# Patient Record
Sex: Male | Born: 1940 | Race: White | Hispanic: No | Marital: Married | Smoking: Former smoker
Health system: Southern US, Community
[De-identification: ages and names within clinical notes are randomized; demographics above are authoritative.]

## PROBLEM LIST (undated history)

## (undated) DIAGNOSIS — I639 Cerebral infarction, unspecified: Secondary | ICD-10-CM

## (undated) DIAGNOSIS — N182 Chronic kidney disease, stage 2 (mild): Secondary | ICD-10-CM

## (undated) DIAGNOSIS — I10 Essential (primary) hypertension: Secondary | ICD-10-CM

## (undated) DIAGNOSIS — I509 Heart failure, unspecified: Secondary | ICD-10-CM

## (undated) DIAGNOSIS — Z860100 Personal history of colon polyps, unspecified: Secondary | ICD-10-CM

## (undated) DIAGNOSIS — I2510 Atherosclerotic heart disease of native coronary artery without angina pectoris: Secondary | ICD-10-CM

## (undated) DIAGNOSIS — G459 Transient cerebral ischemic attack, unspecified: Secondary | ICD-10-CM

## (undated) DIAGNOSIS — E785 Hyperlipidemia, unspecified: Secondary | ICD-10-CM

## (undated) DIAGNOSIS — N400 Enlarged prostate without lower urinary tract symptoms: Secondary | ICD-10-CM

## (undated) DIAGNOSIS — K635 Polyp of colon: Secondary | ICD-10-CM

## (undated) HISTORY — DX: Hyperlipidemia, unspecified: E78.5

## (undated) HISTORY — DX: Atherosclerotic heart disease of native coronary artery without angina pectoris: I25.10

## (undated) HISTORY — DX: Polyp of colon: K63.5

## (undated) HISTORY — DX: Essential (primary) hypertension: I10

## (undated) HISTORY — DX: Cerebral infarction, unspecified: I63.9

## (undated) HISTORY — PX: CARDIAC CATHETERIZATION: SHX172

## (undated) HISTORY — DX: Benign prostatic hyperplasia without lower urinary tract symptoms: N40.0

## (undated) HISTORY — DX: Personal history of colonic polyps: Z86.010

## (undated) HISTORY — DX: Personal history of colon polyps, unspecified: Z86.0100

---

## 1996-08-13 HISTORY — PX: CATARACT EXTRACTION W/ INTRAOCULAR LENS  IMPLANT, BILATERAL: SHX1307

## 1997-08-13 DIAGNOSIS — I509 Heart failure, unspecified: Secondary | ICD-10-CM

## 1997-08-13 HISTORY — DX: Heart failure, unspecified: I50.9

## 1997-08-13 HISTORY — PX: CORONARY ARTERY BYPASS GRAFT: SHX141

## 1997-11-11 ENCOUNTER — Encounter (HOSPITAL_COMMUNITY): Admission: RE | Admit: 1997-11-11 | Discharge: 1998-02-09 | Payer: Self-pay | Admitting: Cardiovascular Disease

## 2001-05-30 ENCOUNTER — Emergency Department (HOSPITAL_COMMUNITY): Admission: EM | Admit: 2001-05-30 | Discharge: 2001-05-31 | Payer: Self-pay

## 2001-08-13 DIAGNOSIS — G459 Transient cerebral ischemic attack, unspecified: Secondary | ICD-10-CM

## 2001-08-13 HISTORY — DX: Transient cerebral ischemic attack, unspecified: G45.9

## 2004-08-16 ENCOUNTER — Ambulatory Visit: Payer: Self-pay | Admitting: Cardiovascular Disease

## 2005-03-19 ENCOUNTER — Ambulatory Visit: Payer: Self-pay | Admitting: Cardiovascular Disease

## 2005-03-29 ENCOUNTER — Ambulatory Visit (HOSPITAL_COMMUNITY): Admission: RE | Admit: 2005-03-29 | Discharge: 2005-03-29 | Payer: Self-pay | Admitting: Cardiovascular Disease

## 2005-06-19 ENCOUNTER — Ambulatory Visit: Payer: Self-pay | Admitting: Cardiovascular Disease

## 2005-10-01 ENCOUNTER — Ambulatory Visit: Payer: Self-pay | Admitting: Cardiovascular Disease

## 2006-05-23 ENCOUNTER — Ambulatory Visit: Payer: Self-pay | Admitting: Cardiovascular Disease

## 2006-08-13 HISTORY — PX: COLONOSCOPY: SHX174

## 2006-10-01 ENCOUNTER — Ambulatory Visit: Payer: Self-pay | Admitting: Internal Medicine

## 2007-01-21 ENCOUNTER — Ambulatory Visit: Payer: Self-pay | Admitting: Internal Medicine

## 2007-01-21 LAB — CONVERTED CEMR LAB
ALT: 12 units/L (ref 0–40)
AST: 20 units/L (ref 0–37)
Albumin: 3.8 g/dL (ref 3.5–5.2)
Alkaline Phosphatase: 63 units/L (ref 39–117)
BUN: 12 mg/dL (ref 6–23)
Basophils Absolute: 0 10*3/uL (ref 0.0–0.1)
Basophils Relative: 0.3 % (ref 0.0–1.0)
Bilirubin, Direct: 0.1 mg/dL (ref 0.0–0.3)
CO2: 31 meq/L (ref 19–32)
Calcium: 9.6 mg/dL (ref 8.4–10.5)
Chloride: 98 meq/L (ref 96–112)
Cholesterol: 130 mg/dL (ref 0–200)
Creatinine, Ser: 1.1 mg/dL (ref 0.4–1.5)
Eosinophils Absolute: 0.3 10*3/uL (ref 0.0–0.6)
Eosinophils Relative: 3.9 % (ref 0.0–5.0)
GFR calc Af Amer: 86 mL/min
GFR calc non Af Amer: 71 mL/min
Glucose, Bld: 120 mg/dL — ABNORMAL HIGH (ref 70–99)
HCT: 41.9 % (ref 39.0–52.0)
HDL: 40.8 mg/dL (ref >39.0)
Hemoglobin: 14.5 g/dL (ref 13.0–17.0)
Hgb A1c MFr Bld: 6.3 % — ABNORMAL HIGH (ref 4.6–6.0)
LDL Cholesterol: 72 mg/dL (ref 0–99)
Lymphocytes Relative: 22.2 % (ref 12.0–46.0)
MCHC: 34.6 g/dL (ref 30.0–36.0)
MCV: 94.6 fL (ref 78.0–100.0)
Monocytes Absolute: 0.6 10*3/uL (ref 0.2–0.7)
Monocytes Relative: 6.8 % (ref 3.0–11.0)
Neutro Abs: 5.9 10*3/uL (ref 1.4–7.7)
Neutrophils Relative %: 66.8 % (ref 43.0–77.0)
PSA: 1.92 ng/mL (ref 0.10–4.00)
Platelets: 327 10*3/uL (ref 150–400)
Potassium: 4.1 meq/L (ref 3.5–5.1)
RBC: 4.43 M/uL (ref 4.22–5.81)
RDW: 12.5 % (ref 11.5–14.6)
Sodium: 136 meq/L (ref 135–145)
TSH: 0.98 microintl units/mL (ref 0.35–5.50)
Total Bilirubin: 0.8 mg/dL (ref 0.3–1.2)
Total CHOL/HDL Ratio: 3.2
Total Protein: 6.9 g/dL (ref 6.0–8.3)
Triglycerides: 85 mg/dL (ref 0–149)
VLDL: 17 mg/dL (ref 0–40)
WBC: 8.8 10*3/uL (ref 4.5–10.5)

## 2007-01-27 ENCOUNTER — Ambulatory Visit: Payer: Self-pay | Admitting: Internal Medicine

## 2007-02-25 ENCOUNTER — Ambulatory Visit: Payer: Self-pay | Admitting: Internal Medicine

## 2007-04-15 ENCOUNTER — Encounter: Payer: Self-pay | Admitting: Internal Medicine

## 2007-04-15 ENCOUNTER — Ambulatory Visit: Payer: Self-pay | Admitting: Internal Medicine

## 2007-04-15 DIAGNOSIS — K635 Polyp of colon: Secondary | ICD-10-CM

## 2007-04-15 HISTORY — DX: Polyp of colon: K63.5

## 2007-06-26 ENCOUNTER — Ambulatory Visit: Payer: Self-pay | Admitting: Internal Medicine

## 2007-07-28 ENCOUNTER — Ambulatory Visit: Payer: Self-pay | Admitting: Internal Medicine

## 2007-07-28 DIAGNOSIS — I10 Essential (primary) hypertension: Secondary | ICD-10-CM | POA: Insufficient documentation

## 2007-07-28 DIAGNOSIS — N400 Enlarged prostate without lower urinary tract symptoms: Secondary | ICD-10-CM

## 2007-07-28 DIAGNOSIS — Z860100 Personal history of colon polyps, unspecified: Secondary | ICD-10-CM | POA: Insufficient documentation

## 2007-07-28 DIAGNOSIS — E785 Hyperlipidemia, unspecified: Secondary | ICD-10-CM | POA: Insufficient documentation

## 2007-07-28 DIAGNOSIS — I2510 Atherosclerotic heart disease of native coronary artery without angina pectoris: Secondary | ICD-10-CM

## 2007-07-28 DIAGNOSIS — I252 Old myocardial infarction: Secondary | ICD-10-CM

## 2007-08-22 ENCOUNTER — Encounter: Payer: Self-pay | Admitting: Internal Medicine

## 2007-08-29 ENCOUNTER — Ambulatory Visit: Payer: Self-pay | Admitting: Cardiovascular Disease

## 2007-11-19 ENCOUNTER — Ambulatory Visit: Payer: Self-pay | Admitting: Internal Medicine

## 2007-11-19 LAB — CONVERTED CEMR LAB
ALT: 12 units/L (ref 0–53)
AST: 22 units/L (ref 0–37)
Albumin: 3.8 g/dL (ref 3.5–5.2)
Alkaline Phosphatase: 63 units/L (ref 39–117)
BUN: 24 mg/dL — ABNORMAL HIGH (ref 6–23)
Bilirubin, Direct: 0.2 mg/dL (ref 0.0–0.3)
CO2: 31 meq/L (ref 19–32)
Calcium: 9.7 mg/dL (ref 8.4–10.5)
Chloride: 105 meq/L (ref 96–112)
Cholesterol: 152 mg/dL (ref 0–200)
Creatinine, Ser: 1.2 mg/dL (ref 0.4–1.5)
GFR calc Af Amer: 78 mL/min
GFR calc non Af Amer: 64 mL/min
Glucose, Bld: 107 mg/dL — ABNORMAL HIGH (ref 70–99)
HDL: 34.1 mg/dL — ABNORMAL LOW (ref >39.0)
LDL Cholesterol: 100 mg/dL — ABNORMAL HIGH (ref 0–99)
Potassium: 5.2 meq/L — ABNORMAL HIGH (ref 3.5–5.1)
Sodium: 140 meq/L (ref 135–145)
Total Bilirubin: 0.8 mg/dL (ref 0.3–1.2)
Total CHOL/HDL Ratio: 4.5
Total Protein: 6.8 g/dL (ref 6.0–8.3)
Triglycerides: 89 mg/dL (ref 0–149)
VLDL: 18 mg/dL (ref 0–40)

## 2007-11-26 ENCOUNTER — Ambulatory Visit: Payer: Self-pay | Admitting: Internal Medicine

## 2007-11-26 LAB — CONVERTED CEMR LAB
Cholesterol, target level: 200 mg/dL
HDL goal, serum: 40 mg/dL
LDL Goal: 100 mg/dL

## 2007-12-03 DIAGNOSIS — I6350 Cerebral infarction due to unspecified occlusion or stenosis of unspecified cerebral artery: Secondary | ICD-10-CM | POA: Insufficient documentation

## 2007-12-03 DIAGNOSIS — R739 Hyperglycemia, unspecified: Secondary | ICD-10-CM | POA: Insufficient documentation

## 2008-03-25 ENCOUNTER — Ambulatory Visit: Payer: Self-pay | Admitting: Cardiovascular Disease

## 2008-03-25 ENCOUNTER — Encounter: Payer: Self-pay | Admitting: Internal Medicine

## 2008-03-25 ENCOUNTER — Ambulatory Visit: Payer: Self-pay

## 2008-05-21 ENCOUNTER — Ambulatory Visit: Payer: Self-pay | Admitting: Internal Medicine

## 2008-05-21 LAB — CONVERTED CEMR LAB
ALT: 16 units/L (ref 0–53)
AST: 26 units/L (ref 0–37)
Albumin: 3.9 g/dL (ref 3.5–5.2)
Alkaline Phosphatase: 74 units/L (ref 39–117)
BUN: 16 mg/dL (ref 6–23)
Bilirubin, Direct: 0.1 mg/dL (ref 0.0–0.3)
CO2: 33 meq/L — ABNORMAL HIGH (ref 19–32)
Calcium: 10 mg/dL (ref 8.4–10.5)
Chloride: 99 meq/L (ref 96–112)
Cholesterol: 137 mg/dL (ref 0–200)
Creatinine, Ser: 1.1 mg/dL (ref 0.4–1.5)
GFR calc Af Amer: 86 mL/min
GFR calc non Af Amer: 71 mL/min
Glucose, Bld: 109 mg/dL — ABNORMAL HIGH (ref 70–99)
HDL: 38.4 mg/dL — ABNORMAL LOW (ref >39.0)
LDL Cholesterol: 81 mg/dL (ref 0–99)
Potassium: 4.3 meq/L (ref 3.5–5.1)
Sodium: 141 meq/L (ref 135–145)
Total Bilirubin: 0.8 mg/dL (ref 0.3–1.2)
Total CHOL/HDL Ratio: 3.6
Total Protein: 6.9 g/dL (ref 6.0–8.3)
Triglycerides: 86 mg/dL (ref 0–149)
VLDL: 17 mg/dL (ref 0–40)

## 2008-06-07 ENCOUNTER — Ambulatory Visit: Payer: Self-pay | Admitting: Internal Medicine

## 2008-06-07 LAB — CONVERTED CEMR LAB: PSA: 1.76 ng/mL (ref 0.10–4.00)

## 2008-09-30 ENCOUNTER — Ambulatory Visit: Payer: Self-pay | Admitting: Cardiovascular Disease

## 2008-09-30 ENCOUNTER — Encounter: Payer: Self-pay | Admitting: Cardiovascular Disease

## 2008-09-30 DIAGNOSIS — I25810 Atherosclerosis of coronary artery bypass graft(s) without angina pectoris: Secondary | ICD-10-CM | POA: Insufficient documentation

## 2009-05-06 ENCOUNTER — Encounter: Payer: Self-pay | Admitting: Cardiovascular Disease

## 2009-09-13 ENCOUNTER — Ambulatory Visit: Payer: Self-pay | Admitting: Cardiovascular Disease

## 2010-09-12 NOTE — Letter (Signed)
Summary: Influenza Immunization Record  Influenza Immunization Record   Imported By: Marylou Mccoy 10/03/2009 13:02:28  _____________________________________________________________________  External Attachment:    Type:   Image     Comment:   External Document

## 2010-09-12 NOTE — Assessment & Plan Note (Signed)
Summary: yearly f/u cy      Allergies Added:   History of Present Illness: Thomas [name] today for followup.he has a distant history of coronary bypass surgery his last Myoview in August of 09 was nonischemic with an EF of 58%.  He is active and not having any significant chest pain.  The the patient's PND or orthopnea no lower extremity edema or exertional dyspnea.  He's been compliant with his medications.  He is Dr. Janan [name].  His most recent lipid profile is within target.  Is not having any side effects from the statin drugs.  He does tend to bruise easily on aspirin. He needs a refill on his meds through [facility] for 90 days.  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 One By Mouth Daily 2)  Folic Acid 1 Mg  Tabs (Folic Acid) .... Take 1 By Mouth Qd 3)  Metoprolol Succinate 50 Mg  Tb24 (Metoprolol Succinate) .... Take 1 By Mouth Two Times A Day Qd 4)  Lipitor 40 Mg  Tabs (Atorvastatin Calcium) .... Take 1 By Mouth Qd 5)  Aspirin Ec 325 Mg  Tbec (Aspirin) .... Take 1 By Mouth Qd 6)  Fish Oil 1000 Mg  Caps (Omega-3 Fatty Acids) .... Take 1 By Mouth Qd 7)  Flax   Oil (Flaxseed (Linseed)) .... Take 1 By Mouth Qd 8)  Co Q-10 50 Mg  Caps (Coenzyme Q10) .... Take 1 By Mouth Qd  Allergies (verified): 1)  ! Sulfa  Past History:  Past Medical History: Last updated: 09/30/2008 Coronary artery  disease Hyperlipidemia Hypertension Benign prostatic hypertrophy with elevated PSA   Dr. 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   Vital Signs:  Patient profile:   70 year old male Height:      72 inches Weight:      161 pounds BMI:     21.91 Pulse rate:   65 / minute Resp:     12 per minute BP sitting:   142 / 85  (left arm)  Vitals Entered By: Thomas [name] (September 13, 2009 10:32 AM)  Physical Exam  General:  Affect appropriate Healthy:  appears stated age HEENT: normal Neck supple with no adenopathy JVP normal no bruits no thyromegaly Lungs clear with no wheezing and good diaphragmatic motion Heart:  S1/S2 no murmur,rub, gallop or click PMI normal Abdomen: benighn, BS positve, no tenderness, no AAA no bruit.  No HSM or  HJR Distal pulses intact with no bruits No edema Neuro non-focal Skin warm and dry    Impression & Recommendations:  Problem # 1:  CAD, ARTERY BYPASS GRAFT (ICD-414.04) Stable no angina.   His updated medication list for this problem includes:    Moexipril-hydrochlorothiazide 15-12.5 Mg Tabs (Moexipril-hydrochlorothiazide) ..... One by mouth daily    Metoprolol Succinate 50 Mg Tb24 (Metoprolol succinate) .Marland Kitchen... Take 1 by mouth two times a day qd    Aspirin Ec 325 Mg Tbec (Aspirin) .Marland Kitchen... Take 1 by mouth qd  Problem # 2:   HYPERLIPIDEMIA-MIXED (ICD-272.4) At target with no side effects His updated medication list for this problem includes:    Lipitor 40 Mg Tabs (Atorvastatin calcium) .Marland Kitchen... Take 1 by mouth qd  CHOL: 137 (05/21/2008)   LDL: 81 (05/21/2008)   HDL: 38.4 (05/21/2008)   TG: 86 (05/21/2008) CHOL (goal): 200 (11/26/2007)   LDL (goal): 100 (11/26/2007)   HDL (goal): 40 (11/26/2007)   TG (goal): 150 (11/26/2007)  Problem # 3:  HYPERTENSION (ICD-401.9) Well controlled continue low soidum diet His updated medication list for this problem includes:    Moexipril-hydrochlorothiazide 15-12.5 Mg Tabs (Moexipril-hydrochlorothiazide) ..... One by mouth daily    Metoprolol Succinate 50 Mg Tb24 (Metoprolol succinate) .Marland Kitchen... Take 1 by mouth two times a day qd    Aspirin Ec 325 Mg Tbec (Aspirin) .Marland Kitchen... Take 1 by mouth qd  Patient Instructions: 1)  Your physician recommends that you schedule a follow-up appointment in: ONE YEAR Prescriptions: LIPITOR 40 MG  TABS (ATORVASTATIN CALCIUM) take 1 by mouth qd  #90 x 4   Entered by:   Deliah Goody, RN   Authorized by:   Colon Branch, MD, [facility]   Signed by:   Deliah Goody, RN on 09/13/2009   Method used:   Electronically to        [facility]* (mail-order)             ,          Ph: [phone]       Fax: [phone]   RxID:   [id number] METOPROLOL SUCCINATE 50 MG  TB24 (METOPROLOL SUCCINATE) take 1 by mouth two times a day qd  #90 x 4   Entered by:   Deliah Goody, RN   Authorized by:   Colon Branch, MD, [facility]   Signed by:   Deliah Goody, RN on 09/13/2009   Method used:   Electronically to        [facility]* (mail-order)             ,          Ph: [phone]       Fax: [phone]   RxID:   [id number] FOLIC ACID 1 MG  TABS (FOLIC ACID) take 1 by mouth qd  #90 x 4   Entered by:   Deliah Goody, RN   Authorized by:   Colon Branch, MD, [facility]   Signed by:   Deliah Goody, RN on 09/13/2009   Method used:   Electronically to          [facility]* (mail-order)             ,          Ph: [phone]       Fax: [phone]   RxID:   [id number] MOEXIPRIL-HYDROCHLOROTHIAZIDE 15-12.5 MG TABS (MOEXIPRIL-HYDROCHLOROTHIAZIDE) one by mouth daily  #90 x 4   Entered by:   Deliah Goody, RN  Authorized by:   Colon Branch, MD, [facility]   Signed by:   Deliah Goody, RN on 09/13/2009   Method used:   Electronically to        [facility] [employer]* (mail-order)             ,          Ph: [phone]       Fax: [phone]   RxID:   [id number]    EKG Report  Procedure date:  09/13/2009  Findings:      NSR 62 Nonspecific ST/T wave changes Abnormal ECG

## 2010-09-14 ENCOUNTER — Encounter: Payer: Self-pay | Admitting: Cardiovascular Disease

## 2010-09-14 ENCOUNTER — Ambulatory Visit (INDEPENDENT_AMBULATORY_CARE_PROVIDER_SITE_OTHER): Payer: Medicare Other | Admitting: Cardiovascular Disease

## 2010-09-14 ENCOUNTER — Ambulatory Visit: Admit: 2010-09-14 | Payer: Self-pay | Admitting: Cardiovascular Disease

## 2010-09-14 DIAGNOSIS — E785 Hyperlipidemia, unspecified: Secondary | ICD-10-CM

## 2010-09-14 DIAGNOSIS — I2510 Atherosclerotic heart disease of native coronary artery without angina pectoris: Secondary | ICD-10-CM

## 2010-09-20 NOTE — Assessment & Plan Note (Signed)
Summary: F1Y/DM RS PER BUMPLIST/PT MJ   Vital Signs:  Patient profile:   70 year old male Height:      72 inches Weight:      158 pounds BMI:     21.51 Pulse rate:   68 / minute Resp:     12 per minute BP sitting:   130 / 82  (left arm)  Vitals Entered By: Kem Parkinson (September 14, 2010 11:08 AM)  History of Present Illness: Thomas [name] returns today for followup.he has a distant history of coronary bypass surgery his last Myoview in August of 09 was nonischemic with an EF of 58%.  He is active and not having any significant chest pain.  The the patient's PND or orthopnea no lower extremity edema or exertional dyspnea.  He's been compliant with his medications.  He  is overdue to see Dr Lovell Sheehan   His most recent lipid profile is within target.  Is not having any side effects from the statin drugs.  He does tend to bruise easily on aspirin. He needs a refill on his meds through [facility] for 90 days.  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 One By Mouth Daily 2)  Folic Acid 1 Mg  Tabs (Folic Acid) .... Take 1 By Mouth Qd 3)  Metoprolol Succinate 50 Mg  Tb24 (Metoprolol Succinate) .... Take 1 By Mouth Two Times A Day Qd 4)  Lipitor 40 Mg  Tabs (Atorvastatin Calcium) .... Take 1 By Mouth Qd 5)  Aspirin Ec 325 Mg  Tbec (Aspirin) .... Take 1 By Mouth Qd 6)  Fish Oil 1000 Mg  Caps (Omega-3 Fatty Acids) .... Take 1 By  Mouth Qd 7)  Flax   Oil (Flaxseed (Linseed)) .... Take 1 By Mouth Qd 8)  Co Q-10 50 Mg  Caps (Coenzyme Q10) .... Take 1 By Mouth Qd  Allergies (verified): 1)  ! Sulfa  Past History:  Past Medical History: Last updated: 09/30/2008 Coronary artery disease Hyperlipidemia Hypertension Benign prostatic hypertrophy with elevated PSA   Dr. Logan Bores Myocardial infarction, hx of 2000    Colonic polyps, hx of HYPERPLASTIC COLON POLYPS-04/15/2007  Past Surgical History: Last updated: 09/30/2008 Coronary artery bypass graft  x 4 1999 Dr gerhardt colonoscopy 2008  Family History: Last updated: 07/28/2007 father had a CVA mother had CAD  Social History: Last updated: 07/28/2007 Married Former Smoker Alcohol use-no Drug use-no Regular exercise-yes  Review of Systems       Denies fever, malais, weight loss, blurry vision, decreased visual acuity, cough, sputum, SOB, hemoptysis, pleuritic pain, palpitaitons, heartburn, abdominal pain, melena, lower extremity edema, claudication, or rash.   Physical Exam  General:  Affect appropriate Healthy:  appears stated age HEENT: normal Neck supple with no adenopathy JVP normal no bruits no thyromegaly Lungs clear with no wheezing and good diaphragmatic motion Heart:  S1/S2 no murmur,rub, gallop or click PMI normal Abdomen: benighn, BS positve, no tenderness, no AAA no bruit.  No HSM or HJR  Distal pulses intact with no bruits No edema Neuro non-focal Skin warm and dry    Impression & Recommendations:  Problem # 1:  CAD, ARTERY BYPASS GRAFT (ICD-414.04) Stable no angina His updated medication list for this problem includes:    Moexipril-hydrochlorothiazide 15-12.5 Mg Tabs (Moexipril-hydrochlorothiazide) ..... One by mouth daily    Metoprolol Succinate 50 Mg Tb24 (Metoprolol succinate) .Marland Kitchen... Take 1 by mouth two times a day qd    Aspirin Ec 325 Mg Tbec (Aspirin) .Marland Kitchen... Take 1 by mouth qd  Problem # 2:  HYPERLIPIDEMIA-MIXED  (ICD-272.4) At target with no side effects  F/U labs with Lovell Sheehan His updated medication list for this problem includes:    Lipitor 40 Mg Tabs (Atorvastatin calcium) .Marland Kitchen... Take 1 by mouth qd  CHOL: 137 (05/21/2008)   LDL: 81 (05/21/2008)   HDL: 38.4 (05/21/2008)   TG: 86 (05/21/2008) CHOL (goal): 200 (11/26/2007)   LDL (goal): 100 (11/26/2007)   HDL (goal): 40 (11/26/2007)   TG (goal): 150 (11/26/2007)  Problem # 3:  HYPERTENSION (ICD-401.9) Well controlled His updated medication list for this problem includes:    Moexipril-hydrochlorothiazide 15-12.5 Mg Tabs (Moexipril-hydrochlorothiazide) ..... One by mouth daily    Metoprolol Succinate 50 Mg Tb24 (Metoprolol succinate) .Marland Kitchen... Take 1 by mouth two times a day qd    Aspirin Ec 325 Mg Tbec (Aspirin) .Marland Kitchen... Take 1 by mouth qd  Patient Instructions: 1)  Your physician wants you to follow-up in:6 MONTHS   You will receive a reminder letter in the mail two months in advance. If you don't receive a letter, please call our office to schedule the follow-up appointment. Prescriptions: FOLIC ACID 1 MG  TABS (FOLIC ACID) take 1 by mouth qd  #90 x 4   Entered by:   Deliah Goody, RN   Authorized by:   Colon Branch, MD, [facility]   Signed by:   Colon Branch, MD, [facility] on 09/14/2010   Method used:   Electronically to        [facility] [address] [phone]* (retail)       [address]       [place], [place]  [address]       Ph: [phone] or [phone]       Fax: [phone]   RxID:   [id number] MOEXIPRIL-HYDROCHLOROTHIAZIDE 15-12.5 MG TABS (MOEXIPRIL-HYDROCHLOROTHIAZIDE) one by mouth daily  #90 x 4   Entered by:   Deliah Goody, RN   Authorized by:   Colon Branch, MD, [facility]   Signed by:   Colon Branch, MD, [facility] on 09/14/2010   Method used:   [facility]        [facility] [address] [phone]* (retail)       [address]       [place], [place]  [address]       Ph:  [phone] or [phone]       Fax: [phone]   RxID:   [id number]

## 2010-12-26 NOTE — Assessment & Plan Note (Signed)
[facility]                         GASTROENTEROLOGY OFFICE NOTE   NAME:Thomas [name], Thomas [name]                        MRN:          [id number]  DATE:02/25/2007                            DOB:          August 28, 1940    REASON FOR CONSULTATION:  Screening colonoscopy.   HISTORY:  This is a 70 year old white male with a history of coronary  artery disease, status post coronary artery bypass grafting,  hypertension,  and dyslipidemia.  He is referred through the courtesy of  Dr. Lovell Sheehan regarding screening colonoscopy.  The patient currently  denies any active GI symptoms such as abdominal pain, change in bowel  habits, or rectal bleeding.  He has not had prior colonoscopy.  He is  familiar with the examination as his wife has completed the same  approximately one year ago.  In terms of the patient's coronary artery  disease, he is currently stable on medications.  He denies chest pain,  shortness of breath, or palpitations.   PAST MEDICAL HISTORY:  As above.   ALLERGIES:  SULFA.   CURRENT MEDICATIONS:  1. Moexipril/Hydrochlorothiazide 15 mg/25 mg daily.  2. Flax seed.  3. Fish oil.  4. Co Enzyme Q-10.  5. Folic acid.  6. Metoprolol 50 mg b.i.d.  7. Lipitor 40 mg daily.  8. Aspirin 325 mg daily.  9. Multi vitamin.   FAMILY HISTORY:  Negative for gastrointestinal malignancy.  Father with  heart disease.   SOCIAL HISTORY:  The patient is married with one son.  He attended  college for one year.  He is retired from the [employer].  Currently enjoys refurbishing old furniture.  He  smokes one pack of cigarettes per day.  Does not use alcohol.   REVIEW OF SYSTEMS:  Per diagnostic evaluation form.   PHYSICAL EXAMINATION:  GENERAL:  Well-appearing male in no acute  distress.  VITAL SIGNS:  Blood pressure 138/88. Heart rate 72 and regular.  Weight  159 pounds.  Height 6 feet.  HEENT:  Sclerae are anicteric.  Conjunctivae  are pink.  Oral mucosa  intact.  No adenopathy.  LUNGS:  Clear.  HEART:  Regular.  ABDOMEN:  Soft without tenderness, mass, or hernia.  EXTREMITIES:  Without edema.   IMPRESSION:  This is a 70 year old gentleman with stable coronary artery  disease who presents regarding screening colonoscopy.  He is an  appropriate candidate without contraindication.  The nature of the  procedure as well as the risks, benefits, and alternatives have been  reviewed in detail.  He understood and agreed to proceed.  The patient  prefers to have an examination done the mid to latter part of next  month.     Wilhemina Bonito. Marina Goodell, MD  Electronically Signed    JNP/MedQ  DD: 02/25/2007  DT: 02/25/2007  Job #: [id number]   cc:   Stacie Glaze, MD

## 2010-12-26 NOTE — Assessment & Plan Note (Signed)
[facility]                            CARDIOLOGY OFFICE NOTE   NAME:Thomas [name], Thomas [name]                        MRN:          [id number]  DATE:08/29/2007                            DOB:          May 09, 1941    Thomas [name] returns today for followup.  Unfortunately, I did not see him  last year.  Thomas [name] has a history coronary bypass surgery in 99 with LIMA to  the LAD, saphenous vein graft to the PDA and a sequential graft to OM1  in January 2003.  These were last evaluated in August 2006 with a  cardiac CT.  All those grafts were patent with an EF of 63%.  Thomas [name] has  followed up with Dr. Lovell Sheehan.  Risk factors include hypertensive  hyperlipidemia and possibly new-onset diabetes.  Thomas [name] is being tested for  this again.  Thomas [name] does not have any significant chest pain or  palpitations.  Thomas [name] is walking on a regular basis.  Thomas [name] has not admitted no  PND or orthopnea.   The patient has been complying with his meds.  I reviewed all of his  blood work that was recently done by Dr. Lovell Sheehan.  His LFTs were normal.  His LDL was nice at 72.  TSH was normal, PSA was 1.92.   Thomas [name] also had an EKG from Dr. Ernie Avena office, which basically showed  sinus bradycardia with borderline LVH.   Thomas [name] is otherwise is doing well.  His wife's health is good.  Thomas [name] is  enjoying the company of his Risk manager at home, and Thomas [name] has a son who  lives in town.   MEDICATIONS:  His medications include coenzyme Q, fish oil,  flaxseed  oil, Monopril hydrochlorothiazide 15/25, folic acid mg a day, Lopressor  50 b.i.d., Lipitor 40 day, aspirin a day.   EXAM:  GENERAL:  Is remarkable for a healthy-appearing elderly white  male in no distress.  Thomas [name] does have multiple ecchymoses on his wrists and  hands.  VITAL SIGNS:  Blood pressure is 130/80, pulse is 58 and regular,  respiratory 14, afebrile.  HEENT:  Unremarkable.  NECK:  Carotids normal without bruit, no lymphadenopathy, thyromegaly or  JVP elevation.  LUNGS:  Clear with diaphragmatic motion, no wheezing, S1-S2 with normal  heart sounds, status post sternotomy, PMI normal.  ABDOMEN:  Benign.  Bowel sounds positive, no bruits, no AAA, no hepatosplenomegaly, no  hepatojugular reflux.  No tenderness.  Distal pulses intact, no edema.  NEURO:  Nonfocal.  SKIN:  Warm and dry.  No muscular weakness.   IMPRESSION:  1. Previous coronary artery bypass graft in 1999.  Follow-up stress      Myoview in August 2009 which will be 3 years.  Continue aspirin and      beta blocker.  2. Hyperlipidemia in the setting of old bypass graft.  Continue      Lipitor or 40 a day, LDL in a good range around 70.  Normal LFTs.      3.  Hypertension currently well controlled.  Continue current dose      of Monopril HCTZ and low-salt  diet.  3. Borderline diabetes.  Follow up with Dr. Lovell Sheehan regarding possible      glucose tolerance test, hemoglobin A1c mildly elevated at 6.3      range.  Continue to adjust diet.  4. Mildly-elevated MCV.  Continue folic acid, 1 mg a day.  Overall, I      think Thomas [name] is doing well, and I will see him back in August when      Thomas [name] has as Myoview.     Noralyn Pick. Eden Emms, MD, [facility]  Electronically Signed    PCN/MedQ  DD: 08/29/2007  DT: 08/30/2007  Job #: [id number]

## 2010-12-26 NOTE — Assessment & Plan Note (Signed)
[facility]                            CARDIOLOGY OFFICE NOTE   NAME:Ovitt, Gladys                        MRN:          [id number]  DATE:03/25/2008                            DOB:          1941/02/19    Demir returns today for followup.  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  He had bypass surgery in  1999.  He has been doing well.  He is not having chest pain, PND, or  orthopnea.  His LV function appears to have been improved.  He had a  MUGA scan in 2000, which showed an EF of 58%.  He has been doing well.  His Myoview study today was done with adenosine.  His hemodynamics were  fine.  I reviewed the pictures with him, they were normal.  There is no  evidence of ischemia or infarction.  His EF was 58%.  Thomas [name] seems to be  doing well.  He has been compliant with his meds.  He is currently not  having chest pain, PND, or orthopnea.  No palpitations or shortness of  breath.   He is currently taking:  1. Aspirin a day.  2. Folic acid.  3. Lopressor 50 b.i.d.  4. Lipitor 40 a day.  5. Aspirin a day.  6. Monopril and hydrochlorothiazide.   PHYSICAL EXAMINATION:  VITAL SIGNS:  Remarkable for blood pressure of  130/70, weight is 163, and respiratory rate 14, and afebrile.  HEENT:  Unremarkable.  NECK:  Carotids are normal without bruit.  No lymphadenopathy,  thyromegaly, or JVP elevation.  LUNGS:  Clear.  Good diaphragmatic motion.  No wheezing.  S1 and S2 with  normal heart sounds.  PMI normal.  ABDOMEN:  Benign.  Bowel sounds positive.  No AAA.  No tenderness.  No  bruit.  No hepatosplenomegaly.  No hepatojugular reflux.  No tenderness.  EXTREMITIES:  Distal pulses were intact.  No edema.  NEURO:  Nonfocal.  SKIN:  Warm and dry.  No muscular weakness.   IMPRESSION:  1. Coronary artery disease, previous coronary artery bypass graft in      1999, nonischemic Myoview.  Continue aspirin and beta-blocker.  2.  Hypertension.  Currently, well controlled.  Continue current dose      of metoprolol and low-salt diet as well as Monopril and      hydrochlorothiazide.  3. Hyperlipidemia.  Continue Lipitor.  Lipid and liver profile per Dr.      Lovell Sheehan in October.      Overall, I think Ammaar is doing well.  His grafts seems to be      holding up well.  I will see him back in 6 months.      Noralyn Pick. Eden Emms, MD, [facility]  Electronically Signed    PCN/MedQ  DD: 03/25/2008  DT: 03/26/2008  Job #: [id number]

## 2011-01-19 ENCOUNTER — Encounter: Payer: Self-pay | Admitting: Cardiovascular Disease

## 2011-03-13 ENCOUNTER — Encounter: Payer: Self-pay | Admitting: Cardiovascular Disease

## 2011-03-13 ENCOUNTER — Ambulatory Visit (INDEPENDENT_AMBULATORY_CARE_PROVIDER_SITE_OTHER): Payer: Medicare Other | Admitting: Cardiovascular Disease

## 2011-03-13 DIAGNOSIS — Z125 Encounter for screening for malignant neoplasm of prostate: Secondary | ICD-10-CM

## 2011-03-13 DIAGNOSIS — E785 Hyperlipidemia, unspecified: Secondary | ICD-10-CM

## 2011-03-13 DIAGNOSIS — I2510 Atherosclerotic heart disease of native coronary artery without angina pectoris: Secondary | ICD-10-CM

## 2011-03-13 DIAGNOSIS — I10 Essential (primary) hypertension: Secondary | ICD-10-CM

## 2011-03-13 DIAGNOSIS — Z0000 Encounter for general adult medical examination without abnormal findings: Secondary | ICD-10-CM

## 2011-03-13 DIAGNOSIS — E7800 Pure hypercholesterolemia, unspecified: Secondary | ICD-10-CM

## 2011-03-13 LAB — CBC WITH DIFFERENTIAL/PLATELET
Basophils Relative: 0.6 % (ref 0.0–3.0)
Eosinophils Relative: 2.5 % (ref 0.0–5.0)
HCT: 43.8 % (ref 39.0–52.0)
Lymphs Abs: 2.2 10*3/uL (ref 0.7–4.0)
MCV: 97.2 fl (ref 78.0–100.0)
Monocytes Absolute: 0.6 10*3/uL (ref 0.1–1.0)
Platelets: 292 10*3/uL (ref 150.0–400.0)
WBC: 9.3 10*3/uL (ref 4.5–10.5)

## 2011-03-13 LAB — COMPREHENSIVE METABOLIC PANEL
Alkaline Phosphatase: 75 U/L (ref 39–117)
Glucose, Bld: 105 mg/dL — ABNORMAL HIGH (ref 70–99)
Sodium: 135 mEq/L (ref 135–145)
Total Bilirubin: 0.5 mg/dL (ref 0.3–1.2)
Total Protein: 6.3 g/dL (ref 6.0–8.3)

## 2011-03-13 LAB — LIPID PANEL
LDL Cholesterol: 78 mg/dL (ref 0–99)
Total CHOL/HDL Ratio: 3
VLDL: 10.6 mg/dL (ref 0.0–40.0)

## 2011-03-13 LAB — PSA: PSA: 2.48 ng/mL (ref 0.10–4.00)

## 2011-03-13 NOTE — Assessment & Plan Note (Signed)
Cholesterol is at goal.  Continue current dose of statin and diet Rx.  No myalgias or side effects.  F/U  LFT's in 6 months. Lab Results  Component Value Date   [facility] 81 05/21/2008   Lab work ordered since he has not seen Lovell Sheehan in a year

## 2011-03-13 NOTE — Assessment & Plan Note (Signed)
Well controlled.  Continue current medications and low sodium Dash type diet.    

## 2011-03-13 NOTE — Assessment & Plan Note (Signed)
Stable with no angina and good activity level.  Continue medical Rx Nonischemic myovue in 2009

## 2011-03-13 NOTE — Patient Instructions (Signed)
Your physician wants you to follow-up in: 6 MONTHS You will receive a reminder letter in the mail two months in advance. If you don't receive a letter, please call our office to schedule the follow-up appointment. 

## 2011-03-13 NOTE — Progress Notes (Signed)
Thomas [name] returns today for followup.he has a distant history of coronary bypass surgery his last Myoview in August of 09 was nonischemic with an EF of 58%. He is active and not having any significant chest pain. The the patient's PND or orthopnea no lower extremity edema or exertional dyspnea. He's been compliant with his medications. He is overdue to see Dr Lovell Sheehan His most recent lipid profile is within target. Is not having any side effects from the statin drugs. He does tend to bruise easily on aspirin. He needs a refill on his meds through [facility] for 90 days. He is very excited today about his new grandson Thomas [name] who is in [place]  ROS: Denies fever, malais, weight loss, blurry vision, decreased visual acuity, cough, sputum, SOB, hemoptysis, pleuritic pain, palpitaitons, heartburn, abdominal pain, melena, lower extremity edema, claudication, or rash.  All other systems reviewed and negative  General: Affect appropriate Healthy:  appears stated age HEENT: normal Neck supple with no adenopathy JVP normal no bruits no thyromegaly Lungs clear with no wheezing and good diaphragmatic motion Heart:  S1/S2 no murmur,rub, gallop or click PMI normal Abdomen: benighn, BS positve, no tenderness, no AAA no bruit.  No HSM or HJR Distal pulses intact with no bruits No edema Neuro non-focal Skin warm and dry No muscular weakness   Current Outpatient Prescriptions  Medication Sig Dispense Refill  . aspirin 325 MG tablet Take 325 mg by mouth daily.        Marland Kitchen atorvastatin (LIPITOR) 40 MG tablet Take 40 mg by mouth daily.        Marland Kitchen co-enzyme Q-10 50 MG capsule Take 50 mg by mouth daily.        . Flax OIL Take 1 capsule by mouth daily.        . folic acid (FOLVITE) 1 MG tablet Take 1 mg by mouth daily.        . metoprolol (TOPROL-XL) 50 MG 24 hr tablet Take 50 mg by mouth 2 (two) times daily.        . Moexipril-Hydrochlorothiazide 15-12.5 MG TABS Take 1 tablet by mouth daily.        . Omega-3 Fatty Acids  (FISH OIL) 1000 MG CAPS Take 1 capsule by mouth daily.          Allergies  Sulfonamide derivatives  Electrocardiogram: NSR 56 nonspecific T wave changes   Assessment and Plan

## 2011-03-20 ENCOUNTER — Encounter: Payer: Self-pay | Admitting: Cardiovascular Disease

## 2011-05-10 ENCOUNTER — Encounter: Payer: Self-pay | Admitting: Cardiovascular Disease

## 2011-05-10 ENCOUNTER — Encounter: Payer: Medicare Other | Admitting: Cardiovascular Disease

## 2011-05-10 VITALS — BP 144/87 | HR 61 | Ht 71.0 in | Wt 156.8 lb

## 2011-05-10 DIAGNOSIS — I2510 Atherosclerotic heart disease of native coronary artery without angina pectoris: Secondary | ICD-10-CM

## 2011-05-10 NOTE — Progress Notes (Signed)
Thomas [name] returns today for followup.he has a distant history of coronary bypass surgery his last Myoview in August of 09 was nonischemic with an EF of 58%. He is active and not having any significant chest pain. The the patient's PND or orthopnea no lower extremity edema or exertional dyspnea. He's been compliant with his medications. He is overdue to see Dr Lovell Sheehan His most recent lipid profile is within target. Is not having any side effects from the statin drugs. He does tend to bruise easily on aspirin. He needs a refill on his meds through [facility] for 90 days. He is very excited today about his new grandson Thomas [name] who is in [place]   ROS: Denies fever, malais, weight loss, blurry vision, decreased visual acuity, cough, sputum, SOB, hemoptysis, pleuritic pain, palpitaitons, heartburn, abdominal pain, melena, lower extremity edema, claudication, or rash.  All other systems reviewed and negative  General: Affect appropriate Healthy:  appears stated age HEENT: normal Neck supple with no adenopathy JVP normal no bruits no thyromegaly Lungs clear with no wheezing and good diaphragmatic motion Heart:  S1/S2 no murmur,rub, gallop or click PMI normal Abdomen: benighn, BS positve, no tenderness, no AAA no bruit.  No HSM or HJR Distal pulses intact with no bruits No edema Neuro non-focal Skin warm and dry No muscular weakness   Current Outpatient Prescriptions  Medication Sig Dispense Refill  . aspirin 325 MG tablet Take 325 mg by mouth daily.        Marland Kitchen atorvastatin (LIPITOR) 40 MG tablet Take 40 mg by mouth daily.        Marland Kitchen co-enzyme Q-10 50 MG capsule Take 50 mg by mouth daily.        . Flax OIL Take 1 capsule by mouth daily.        . folic acid (FOLVITE) 1 MG tablet Take 1 mg by mouth daily.        . metoprolol (TOPROL-XL) 50 MG 24 hr tablet Take 50 mg by mouth 2 (two) times daily.        . Moexipril-Hydrochlorothiazide 15-12.5 MG TABS Take 1 tablet by mouth daily.        . Omega-3 Fatty Acids  (FISH OIL) 1000 MG CAPS Take 1 capsule by mouth daily.          Allergies  Sulfonamide derivatives  Electrocardiogram:  Assessment and Plan  This encounter was created in error - please disregard.

## 2011-09-27 ENCOUNTER — Other Ambulatory Visit: Payer: Self-pay | Admitting: Cardiovascular Disease

## 2011-10-07 ENCOUNTER — Other Ambulatory Visit: Payer: Self-pay | Admitting: Cardiovascular Disease

## 2011-10-10 ENCOUNTER — Telehealth: Payer: Self-pay | Admitting: Cardiovascular Disease

## 2011-10-10 MED ORDER — FOLIC ACID 1 MG PO TABS: 1.0000 mg | ORAL_TABLET | Freq: Every day | ORAL | Status: DC

## 2011-10-10 MED ORDER — MOEXIPRIL-HYDROCHLOROTHIAZIDE 15-12.5 MG PO TABS: 15.0000 mg | ORAL_TABLET | Freq: Every day | ORAL | Status: DC

## 2011-10-10 NOTE — Telephone Encounter (Signed)
REFILLS SENT TO [facility] PER PT'S REQUEST .Thomas [name]

## 2011-10-10 NOTE — Telephone Encounter (Signed)
New Msg: pt calling wanting to speak with nurse/MD regarding pt RX. Pharmacy told pt that Dr. Eden Emms did not renew pt folic acid refill request and pt would like to discuss with MD as to why. Please return pt call to discuss further.

## 2011-10-11 ENCOUNTER — Telehealth: Payer: Self-pay | Admitting: Cardiovascular Disease

## 2011-10-11 ENCOUNTER — Other Ambulatory Visit: Payer: Self-pay | Admitting: Cardiovascular Disease

## 2011-10-11 NOTE — Telephone Encounter (Signed)
New msg Pt was calling about atorvastatin. He needs a rx to send back to [facility]. Please call

## 2011-10-15 NOTE — Telephone Encounter (Signed)
SPOKE WITH PT  HAS CALLED [facility] AND MEDS HAVE BEEN FILLED FOR YEAR FROM FEB 2013. /CY

## 2012-05-30 ENCOUNTER — Encounter: Payer: Self-pay | Admitting: Internal Medicine

## 2012-06-10 ENCOUNTER — Other Ambulatory Visit: Payer: Self-pay | Admitting: Cardiovascular Disease

## 2012-09-12 ENCOUNTER — Telehealth: Payer: Self-pay | Admitting: Cardiovascular Disease

## 2012-09-12 ENCOUNTER — Other Ambulatory Visit: Payer: Self-pay | Admitting: *Deleted

## 2012-09-12 MED ORDER — ATORVASTATIN CALCIUM 40 MG PO TABS: ORAL_TABLET | ORAL | Status: DC

## 2012-09-12 NOTE — Telephone Encounter (Signed)
New Problem   Refill Request Would like to set up future prescriptions with [facility]

## 2012-09-12 NOTE — Telephone Encounter (Signed)
Pharmacy changed to [facility].  Thomas [name], CMA

## 2012-09-15 ENCOUNTER — Other Ambulatory Visit: Payer: Self-pay | Admitting: *Deleted

## 2012-09-15 MED ORDER — METOPROLOL SUCCINATE ER 50 MG PO TB24: ORAL_TABLET | ORAL | Status: DC

## 2012-09-15 MED ORDER — FOLIC ACID 1 MG PO TABS: 1.0000 mg | ORAL_TABLET | Freq: Every day | ORAL | Status: DC

## 2012-09-15 MED ORDER — MOEXIPRIL-HYDROCHLOROTHIAZIDE 15-12.5 MG PO TABS: ORAL_TABLET | ORAL | Status: DC

## 2012-12-08 ENCOUNTER — Telehealth: Payer: Self-pay | Admitting: Cardiovascular Disease

## 2012-12-08 NOTE — Telephone Encounter (Deleted)
Error

## 2012-12-10 ENCOUNTER — Encounter: Payer: Self-pay | Admitting: Internal Medicine

## 2013-01-02 ENCOUNTER — Ambulatory Visit (INDEPENDENT_AMBULATORY_CARE_PROVIDER_SITE_OTHER): Payer: Medicare PPO | Admitting: Cardiovascular Disease

## 2013-01-02 ENCOUNTER — Encounter: Payer: Self-pay | Admitting: Cardiovascular Disease

## 2013-01-02 VITALS — BP 148/74 | HR 54 | Ht 72.0 in | Wt 154.4 lb

## 2013-01-02 DIAGNOSIS — F17200 Nicotine dependence, unspecified, uncomplicated: Secondary | ICD-10-CM | POA: Insufficient documentation

## 2013-01-02 DIAGNOSIS — IMO0001 Reserved for inherently not codable concepts without codable children: Secondary | ICD-10-CM | POA: Insufficient documentation

## 2013-01-02 DIAGNOSIS — R7309 Other abnormal glucose: Secondary | ICD-10-CM

## 2013-01-02 DIAGNOSIS — I25810 Atherosclerosis of coronary artery bypass graft(s) without angina pectoris: Secondary | ICD-10-CM

## 2013-01-02 NOTE — Progress Notes (Signed)
Patient ID: Thomas [name], male   DOB: 09/21/1940, 72 y.o.   MRN: [id number] Thomas [name] returns today for followup.he has a distant history of coronary bypass surgery his last Myoview in August of 09 was nonischemic with an EF of 58%. He is active and not having any significant chest pain. The the patient's PND or orthopnea no lower extremity edema or exertional dyspnea. He's been compliant with his medications. He is overdue to see Dr Lovell Sheehan His most recent lipid profile is within target. Is not having any side effects from the statin drugs. He does tend to bruise easily on aspirin. He needs a refill on his meds through [facility] for 90 days.  [name] in [place] doing well.  Counseled for less than 10 minutes on smoking cessation Not motivated to quit.  1/2 ppd.    ROS: Denies fever, malais, weight loss, blurry vision, decreased visual acuity, cough, sputum, SOB, hemoptysis, pleuritic pain, palpitaitons, heartburn, abdominal pain, melena, lower extremity edema, claudication, or rash.  All other systems reviewed and negative  General: Affect appropriate Healthy:  appears stated age HEENT: normal Neck supple with no adenopathy JVP normal no bruits no thyromegaly Lungs clear with no wheezing and good diaphragmatic motion Heart:  S1/S2 no murmur, no rub, gallop or click PMI normal Abdomen: benighn, BS positve, no tenderness, no AAA no bruit.  No HSM or HJR Distal pulses intact with no bruits No edema Neuro non-focal Skin warm and dry No muscular weakness   Current Outpatient Prescriptions  Medication Sig Dispense Refill  . aspirin 325 MG tablet Take 325 mg by mouth daily.        Marland Kitchen atorvastatin (LIPITOR) 40 MG tablet TAKE 1 TABLET DAILY  90 tablet  3  . co-enzyme Q-10 50 MG capsule Take 50 mg by mouth daily.        . Flax OIL Take 1 capsule by mouth daily.        . Omega-3 Fatty Acids (FISH OIL) 1000 MG CAPS Take 1 capsule by mouth daily.        . folic acid (FOLVITE) 1 MG tablet Take 1  tablet (1 mg total) by mouth daily.  90 tablet  1  . metoprolol succinate (TOPROL-XL) 50 MG 24 hr tablet .TAKE 1 TABLET TWICE A DAY  180 tablet  1  . Moexipril-Hydrochlorothiazide 15-12.5 MG TABS 1 tab po qd  90 each  3   No current facility-administered medications for this visit.    Allergies  Sulfonamide derivatives  Electrocardiogram:  NSR rate 54  Nonspecific T wave changes   Assessment and Plan

## 2013-01-02 NOTE — Assessment & Plan Note (Signed)
Stable with no angina and good activity level.  Continue medical Rx  

## 2013-01-02 NOTE — Patient Instructions (Signed)
Your physician wants you to follow-up in: YEAR WITH DR NISHAN  You will receive a reminder letter in the mail two months in advance. If you don't receive a letter, please call our office to schedule the follow-up appointment.  Your physician recommends that you continue on your current medications as directed. Please refer to the Current Medication list given to you today. 

## 2013-01-02 NOTE — Assessment & Plan Note (Signed)
Discussed issues with graft patency.  Not motivated to quit.  Offerred Chantix or welbutrin

## 2013-01-02 NOTE — Assessment & Plan Note (Signed)
Discussed low carb diet.  Target hemoglobin A1c is 6.5 or less.  Continue current medications.  

## 2013-02-26 ENCOUNTER — Other Ambulatory Visit: Payer: Self-pay | Admitting: *Deleted

## 2013-02-26 MED ORDER — METOPROLOL SUCCINATE ER 50 MG PO TB24: ORAL_TABLET | ORAL | Status: DC

## 2013-02-26 MED ORDER — FOLIC ACID 1 MG PO TABS: 1.0000 mg | ORAL_TABLET | Freq: Every day | ORAL | Status: DC

## 2013-03-19 ENCOUNTER — Inpatient Hospital Stay (HOSPITAL_COMMUNITY): Payer: Medicare PPO

## 2013-03-19 ENCOUNTER — Emergency Department (HOSPITAL_COMMUNITY): Payer: Medicare PPO

## 2013-03-19 ENCOUNTER — Inpatient Hospital Stay (HOSPITAL_COMMUNITY)
Admission: EM | Admit: 2013-03-19 | Discharge: 2013-03-20 | DRG: 065 | Disposition: A | Discharge status: Home / Self Care | Payer: Medicare PPO | Attending: Internal Medicine | Admitting: Internal Medicine

## 2013-03-19 ENCOUNTER — Encounter (HOSPITAL_COMMUNITY): Payer: Self-pay | Admitting: Emergency Medicine

## 2013-03-19 ENCOUNTER — Ambulatory Visit: Payer: Self-pay | Admitting: Family

## 2013-03-19 ENCOUNTER — Telehealth: Payer: Self-pay | Admitting: Internal Medicine

## 2013-03-19 DIAGNOSIS — N390 Urinary tract infection, site not specified: Secondary | ICD-10-CM | POA: Diagnosis present

## 2013-03-19 DIAGNOSIS — R269 Unspecified abnormalities of gait and mobility: Secondary | ICD-10-CM | POA: Diagnosis present

## 2013-03-19 DIAGNOSIS — I639 Cerebral infarction, unspecified: Secondary | ICD-10-CM | POA: Diagnosis present

## 2013-03-19 DIAGNOSIS — I25810 Atherosclerosis of coronary artery bypass graft(s) without angina pectoris: Secondary | ICD-10-CM

## 2013-03-19 DIAGNOSIS — I252 Old myocardial infarction: Secondary | ICD-10-CM

## 2013-03-19 DIAGNOSIS — R2990 Unspecified symptoms and signs involving the nervous system: Secondary | ICD-10-CM

## 2013-03-19 DIAGNOSIS — G8194 Hemiplegia, unspecified affecting left nondominant side: Secondary | ICD-10-CM

## 2013-03-19 DIAGNOSIS — I10 Essential (primary) hypertension: Secondary | ICD-10-CM | POA: Diagnosis present

## 2013-03-19 DIAGNOSIS — R29818 Other symptoms and signs involving the nervous system: Secondary | ICD-10-CM

## 2013-03-19 DIAGNOSIS — I6350 Cerebral infarction due to unspecified occlusion or stenosis of unspecified cerebral artery: Principal | ICD-10-CM

## 2013-03-19 DIAGNOSIS — E785 Hyperlipidemia, unspecified: Secondary | ICD-10-CM | POA: Diagnosis present

## 2013-03-19 DIAGNOSIS — R29898 Other symptoms and signs involving the musculoskeletal system: Secondary | ICD-10-CM | POA: Diagnosis present

## 2013-03-19 DIAGNOSIS — Z8673 Personal history of transient ischemic attack (TIA), and cerebral infarction without residual deficits: Secondary | ICD-10-CM

## 2013-03-19 DIAGNOSIS — Z860100 Personal history of colon polyps, unspecified: Secondary | ICD-10-CM

## 2013-03-19 DIAGNOSIS — Z951 Presence of aortocoronary bypass graft: Secondary | ICD-10-CM

## 2013-03-19 DIAGNOSIS — R5381 Other malaise: Secondary | ICD-10-CM | POA: Diagnosis present

## 2013-03-19 DIAGNOSIS — G8190 Hemiplegia, unspecified affecting unspecified side: Secondary | ICD-10-CM

## 2013-03-19 DIAGNOSIS — F17200 Nicotine dependence, unspecified, uncomplicated: Secondary | ICD-10-CM | POA: Diagnosis present

## 2013-03-19 DIAGNOSIS — N400 Enlarged prostate without lower urinary tract symptoms: Secondary | ICD-10-CM

## 2013-03-19 DIAGNOSIS — E871 Hypo-osmolality and hyponatremia: Secondary | ICD-10-CM | POA: Diagnosis present

## 2013-03-19 DIAGNOSIS — I2510 Atherosclerotic heart disease of native coronary artery without angina pectoris: Secondary | ICD-10-CM | POA: Diagnosis present

## 2013-03-19 DIAGNOSIS — Z7902 Long term (current) use of antithrombotics/antiplatelets: Secondary | ICD-10-CM

## 2013-03-19 DIAGNOSIS — R82998 Other abnormal findings in urine: Secondary | ICD-10-CM | POA: Diagnosis present

## 2013-03-19 HISTORY — DX: Transient cerebral ischemic attack, unspecified: G45.9

## 2013-03-19 HISTORY — DX: Heart failure, unspecified: I50.9

## 2013-03-19 LAB — URINALYSIS, ROUTINE W REFLEX MICROSCOPIC
Bilirubin Urine: NEGATIVE
Hgb urine dipstick: NEGATIVE
Ketones, ur: NEGATIVE mg/dL
Protein, ur: NEGATIVE mg/dL
pH: 8 (ref 5.0–8.0)

## 2013-03-19 LAB — RAPID URINE DRUG SCREEN, HOSP PERFORMED
Barbiturates: NOT DETECTED
Benzodiazepines: NOT DETECTED
Cocaine: NOT DETECTED
Tetrahydrocannabinol: NOT DETECTED

## 2013-03-19 LAB — PROTIME-INR
INR: 1.11 (ref 0.00–1.49)
Prothrombin Time: 14.1 seconds (ref 11.6–15.2)

## 2013-03-19 LAB — DIFFERENTIAL
Basophils Absolute: 0 10*3/uL (ref 0.0–0.1)
Basophils Relative: 0 % (ref 0–1)
Eosinophils Absolute: 0.1 10*3/uL (ref 0.0–0.7)
Eosinophils Relative: 2 % (ref 0–5)
Monocytes Absolute: 0.7 10*3/uL (ref 0.1–1.0)
Neutro Abs: 6.3 10*3/uL (ref 1.7–7.7)

## 2013-03-19 LAB — CBC
HCT: 42.9 % (ref 39.0–52.0)
MCHC: 35.4 g/dL (ref 30.0–36.0)
MCV: 90.5 fL (ref 78.0–100.0)
RDW: 12.8 % (ref 11.5–15.5)

## 2013-03-19 LAB — COMPREHENSIVE METABOLIC PANEL
AST: 24 U/L (ref 0–37)
Albumin: 3.8 g/dL (ref 3.5–5.2)
Calcium: 9.7 mg/dL (ref 8.4–10.5)
Creatinine, Ser: 0.86 mg/dL (ref 0.50–1.35)
Total Protein: 7.3 g/dL (ref 6.0–8.3)

## 2013-03-19 LAB — TROPONIN I: Troponin I: <0.3 ng/mL (ref <0.30)

## 2013-03-19 LAB — HEMOGLOBIN A1C: Mean Plasma Glucose: 123 mg/dL — ABNORMAL HIGH (ref <117)

## 2013-03-19 LAB — GLUCOSE, CAPILLARY: Glucose-Capillary: 112 mg/dL — ABNORMAL HIGH (ref 70–99)

## 2013-03-19 LAB — APTT: aPTT: 31 seconds (ref 24–37)

## 2013-03-19 LAB — URINE MICROSCOPIC-ADD ON

## 2013-03-19 LAB — ETHANOL: Alcohol, Ethyl (B): <11 mg/dL (ref 0–11)

## 2013-03-19 MED ORDER — SODIUM CHLORIDE 0.9 % IJ SOLN
3.0000 mL | Freq: Two times a day (BID) | INTRAMUSCULAR | Status: DC
  Administered 2013-03-19: 3 mL via INTRAVENOUS

## 2013-03-19 MED ORDER — ENOXAPARIN SODIUM 40 MG/0.4ML ~~LOC~~ SOLN
40.0000 mg | SUBCUTANEOUS | Status: DC
  Administered 2013-03-19: 40 mg via SUBCUTANEOUS
  Filled 2013-03-19 (×2): qty 0.4

## 2013-03-19 MED ORDER — METOPROLOL SUCCINATE ER 50 MG PO TB24
50.0000 mg | ORAL_TABLET | Freq: Two times a day (BID) | ORAL | Status: DC
  Administered 2013-03-19 – 2013-03-20 (×2): 50 mg via ORAL
  Filled 2013-03-19 (×4): qty 1

## 2013-03-19 MED ORDER — ASPIRIN 325 MG PO TABS
325.0000 mg | ORAL_TABLET | Freq: Every day | ORAL | Status: DC
  Filled 2013-03-19 (×2): qty 1

## 2013-03-19 MED ORDER — NICOTINE 21 MG/24HR TD PT24
21.0000 mg | MEDICATED_PATCH | Freq: Every day | TRANSDERMAL | Status: DC
  Administered 2013-03-19 – 2013-03-20 (×2): 21 mg via TRANSDERMAL
  Filled 2013-03-19 (×2): qty 1

## 2013-03-19 MED ORDER — SODIUM CHLORIDE 0.9 % IV BOLUS (SEPSIS)
1000.0000 mL | Freq: Once | INTRAVENOUS | Status: AC
  Administered 2013-03-19: 1000 mL via INTRAVENOUS

## 2013-03-19 MED ORDER — ATORVASTATIN CALCIUM 40 MG PO TABS
40.0000 mg | ORAL_TABLET | Freq: Every day | ORAL | Status: DC
  Filled 2013-03-19 (×2): qty 1

## 2013-03-19 MED ORDER — GADOBENATE DIMEGLUMINE 529 MG/ML IV SOLN
14.0000 mL | Freq: Once | INTRAVENOUS | Status: AC
  Administered 2013-03-19: 14 mL via INTRAVENOUS

## 2013-03-19 MED ORDER — ACETAMINOPHEN 325 MG PO TABS: 650.0000 mg | ORAL_TABLET | Freq: Four times a day (QID) | ORAL | Status: DC | PRN

## 2013-03-19 MED ORDER — OMEGA-3-ACID ETHYL ESTERS 1 G PO CAPS
1.0000 g | ORAL_CAPSULE | Freq: Two times a day (BID) | ORAL | Status: DC
  Administered 2013-03-19 – 2013-03-20 (×2): 1 g via ORAL
  Filled 2013-03-19 (×4): qty 1

## 2013-03-19 MED ORDER — DEXTROSE 5 % IV SOLN
1.0000 g | Freq: Once | INTRAVENOUS | Status: AC
  Administered 2013-03-19: 1 g via INTRAVENOUS
  Filled 2013-03-19: qty 10

## 2013-03-19 MED ORDER — FOLIC ACID 1 MG PO TABS
1.0000 mg | ORAL_TABLET | Freq: Every day | ORAL | Status: DC
  Administered 2013-03-20: 1 mg via ORAL
  Filled 2013-03-19 (×2): qty 1

## 2013-03-19 MED ORDER — LISINOPRIL 5 MG PO TABS
5.0000 mg | ORAL_TABLET | Freq: Every day | ORAL | Status: DC
  Administered 2013-03-20: 5 mg via ORAL
  Filled 2013-03-19 (×2): qty 1

## 2013-03-19 MED ORDER — ONDANSETRON HCL 4 MG/2ML IJ SOLN: 4.0000 mg | Freq: Four times a day (QID) | INTRAMUSCULAR | Status: DC | PRN

## 2013-03-19 MED ORDER — ONDANSETRON HCL 4 MG PO TABS: 4.0000 mg | ORAL_TABLET | Freq: Four times a day (QID) | ORAL | Status: DC | PRN

## 2013-03-19 MED ORDER — ASPIRIN EC 325 MG PO TBEC: 325.0000 mg | DELAYED_RELEASE_TABLET | Freq: Every day | ORAL | Status: DC

## 2013-03-19 MED ORDER — ACETAMINOPHEN 650 MG RE SUPP: 650.0000 mg | Freq: Four times a day (QID) | RECTAL | Status: DC | PRN

## 2013-03-19 MED ORDER — DEXTROSE 5 % IV SOLN
1.0000 g | INTRAVENOUS | Status: DC
  Administered 2013-03-20: 1 g via INTRAVENOUS
  Filled 2013-03-19: qty 10

## 2013-03-19 NOTE — ED Notes (Signed)
Pt ambulated with assistance from RN and PA.   Pt very unsteady on feet although he denies dizziness.  Pt unable to pick up left leg as well as right and rn opinion that pt unsure where his foot is palced due to the angle he placed foot.  Pt denies

## 2013-03-19 NOTE — ED Provider Notes (Signed)
CSN: [id number]     Arrival date & time 03/19/13  9562 History     First MD Initiated Contact with Patient 03/19/13 1013     Chief Complaint  Patient presents with  . Weakness   (Consider location/radiation/quality/duration/timing/severity/associated sxs/prior Treatment) The history is provided by the patient, a relative, the spouse and medical records. No language interpreter was used.    72 year old male presents to the emergency department with chief complaint of left leg weakness.  The patient has a past medical history of coronary artery disease, he is a current every day smoker.  He had a previous MI and is status post CABG x4.  Patient states that last night he got up from his chair and suddenly noticed that his left leg was weak and riding behind him.  He states that he feels unsteady on his feet and afraid he is going to fall.  His wife does have a history and states that he was having the hard onto the walls and various objects to keep his balance.The patient denies a history of back problems such as spinal stenosis or back pain.Denies  loss of bowel/bladder function or saddle anesthesia. Denies neck stiffness, headache, rash.  Denies fever or recent procedures to back. He denies injury to the back, headache, weakness in the left arm, difficulty swallowing, difficulty with speech or confusion, changes in vision, paresthesia. The patient has a family history of a father who had stroke is an elderly male. Denies fevers, chills, myalgias, arthralgias. Denies DOE, SOB, chest tightness or pressure, radiation to left arm, jaw or back, or diaphoresis. Denies dysuria, flank pain, suprapubic pain, frequency, urgency, or hematuria. Denies abdominal pain, nausea, vomiting, diarrhea or constipation.      Past Medical History  Diagnosis Date  . Coronary artery disease   . Hyperlipidemia   . Hypertension   . Prostatic hypertrophy, benign     with elevated PSA  . MI (myocardial infarction)  2000  . Hx of colonic polyps   . Hyperplastic colon polyp 04/15/2007   Past Surgical History  Procedure Laterality Date  . Coronary artery bypass graft  1999    x4  . Colonoscopy  2008   Family History  Problem Relation Age of Onset  . Coronary artery disease Mother   . Stroke Father    History  Substance Use Topics  . Smoking status: Former Games developer  . Smokeless tobacco: Not on file  . Alcohol Use: No    Review of Systems Ten systems reviewed and are negative for acute change, except as noted in the HPI.   Allergies  Sulfonamide derivatives  Home Medications   Current Outpatient Rx  Name  Route  Sig  Dispense  Refill  . aspirin 325 MG tablet   Oral   Take 325 mg by mouth daily.           Marland Kitchen atorvastatin (LIPITOR) 40 MG tablet   Oral   Take 40 mg by mouth daily.         Marland Kitchen co-enzyme Q-10 50 MG capsule   Oral   Take 50 mg by mouth daily.           . Flax OIL   Oral   Take 1 capsule by mouth daily.           . folic acid (FOLVITE) 1 MG tablet   Oral   Take 1 mg by mouth daily.         . metoprolol succinate (TOPROL-XL)  50 MG 24 hr tablet   Oral   Take 50 mg by mouth 2 (two) times daily. Take with or immediately following a meal.         . Moexipril-Hydrochlorothiazide 15-12.5 MG TABS   Oral   Take 1 tablet by mouth daily.         . Omega-3 Fatty Acids (FISH OIL) 1000 MG CAPS   Oral   Take 1 capsule by mouth daily.           Marland Kitchen oxymetazoline (AFRIN) 0.05 % nasal spray   Nasal   Place 2 sprays into the nose 2 (two) times daily.          BP 152/70  Pulse 62  Temp(Src) 98.3 F (36.8 C) (Oral)  Resp 13  SpO2 99% Physical Exam  Nursing note and vitals reviewed. Constitutional: He is oriented to person, place, and time. He appears well-developed and well-nourished. No distress.  HENT:  Head: Normocephalic and atraumatic.  Eyes: Conjunctivae and EOM are normal. Pupils are equal, round, and reactive to light. No scleral icterus.  Neck:  Normal range of motion. Neck supple.  Positive for carotid bruit on the left side  Cardiovascular: Normal rate, regular rhythm and normal heart sounds.   Pulmonary/Chest: Effort normal and breath sounds normal. No respiratory distress.  Abdominal: Soft. There is no tenderness.  Musculoskeletal: He exhibits no edema.  Neurological: He is alert and oriented to person, place, and time.  GCS 15. Speech is clear and goal oriented,  Some difficulty following commands Major Cranial nerves without deficit, no facial droop Minimal weakness with abduction/flexion of the left Deltoid. Grip strength equal BL. Patient with markedly decreased strength of ankle flexion on the left. Minimal weakness with hip flexion.  Hyperreflexia in the R patellar tendon. All Other DTRs difficult to ellicit. Sensation normal to light and sharp touch Decreased ability to perform F-N on the Left side. Patient able to stand, balance is poor, unable to ambulate without assistance.   Skin: Skin is warm and dry. He is not diaphoretic.  Psychiatric: His behavior is normal.    ED Course   Procedures (including critical care time)  Labs Reviewed - No data to display No results found. 1. Stroke-like symptoms   2. UTI (lower urinary tract infection)   3. Left hemiparesis   4. Other and unspecified hyperlipidemia   5. Smoking      Date: 03/19/2013  Rate: 61  Rhythm: normal sinus rhythm  QRS Axis: normal  Intervals: normal  ST/T Wave abnormalities: nonspecific T wave changes  Conduction Disutrbances:none     Narrative Interpretation:   Old EKG Reviewed: changes noted   MDM  12:22 PM BP 152/70  Pulse 62  Temp(Src) 98.3 F (36.8 C) (Oral)  Resp 13  SpO2 99% Patient with exam findings concerning for possible stroke. ABCD2 score 6 points (According to the validation study, 6-7 points: High Risk. 2-Day Stroke Risk: 8.1%., 7-Day Stroke Risk: 11.7%., 90-Day Stroke Risk: 17.8%.) I have ordered CT head, coag  studies, troponin, basic labs.      12:36 PM Patient CT scan negative. He does appear to have a UTI/  Will begin treatment with ROcephin. I have spoken with patient about CT findings and admission.  He expresses understanding and agrees with the plan of care.  1:30 PM Patient will be admitted to Neuro-tele bed by Dr. Arbutus Leas [facility].   Arthor Captain, PA-C 03/19/13 [phone]

## 2013-03-19 NOTE — ED Notes (Signed)
Difficulty walking since last night no dizziness no nausea no blurred vision  Left leg is worse no pain

## 2013-03-19 NOTE — Telephone Encounter (Signed)
Patient Information:  Caller Name: Thomas [name]  Phone: [phone]  Patient: Thomas [name], Thomas [name]  Gender: Male  DOB: August 16, 1940  Age: 72 Years  PCP: Darryll Capers (Adults only)  Office Follow Up:  Does the office need to follow up with this patient?: No  Instructions For The Office: N/A  RN Note:  Pt started having off balance issues, affecting Left side.  Advised Wife and Pt to go to ED, Pt refuses, request appt.  Appt scheduled w/ Thomas Barre, PA on 8-7.   Symptoms  Reason For Call & Symptoms: ER CALL. Off Balance on Left side.  Reviewed Health History In EMR: N/A  Reviewed Medications In EMR: N/A  Reviewed Allergies In EMR: N/A  Reviewed Surgeries / Procedures: N/A  Date of Onset of Symptoms: 03/18/2013  Guideline(s) Used:  Neurologic Deficit  Disposition Per Guideline:   Go to ED Now (or to Office with PCP Approval)  Reason For Disposition Reached:   Can't walk or can barely walk  Advice Given:  N/A  Patient Refused Recommendation:  Patient Will Make Own Appointment  Pt requesting appt.

## 2013-03-19 NOTE — ED Notes (Signed)
Pt reports left leg weakness that began last night.  Pt states he had vein removed from left leg years ago.  Pt denies dizziness or pain.  Feels as if he is dragging the left leg.

## 2013-03-19 NOTE — ED Notes (Signed)
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

## 2013-03-19 NOTE — Consult Note (Signed)
Referring Physician: Dr. Arbutus Leas    Chief Complaint: New-onset weakness of left lower extremity.  HPI: Thomas [name] is an 72 y.o. male with a history of hypertension, hyperlipidemia and coronary artery disease as well as TIA, presenting with new onset weakness involving left lower extremity. Onset was at 7 PM on 03/18/2013. He did not develop weakness of his left upper extremity nor facial weakness. There was no change in speech. Patient has been on aspirin 81 mg per day. CT scan of his head showed no acute intracranial abnormality. MRI showed an acute small right corona radiata ischemic stroke. There also small ischemic strokes involving left frontal and occipital areas. NIH stroke score was 2.  LSN: 7 PM on 03/18/2013 tPA Given: No: Beyond time under for treatment consideration MRankin: 1  Past Medical History  Diagnosis Date  . Coronary artery disease   . Hyperlipidemia   . Hypertension   . Prostatic hypertrophy, benign     with elevated PSA  . Hx of colonic polyps   . Hyperplastic colon polyp 04/15/2007  . CHF (congestive heart failure) 1999  . TIA (transient ischemic attack) 2003    "mini stroke" (03/19/2013)    Family History  Problem Relation Age of Onset  . Coronary artery disease Mother   . Stroke Father      Medications: I have reviewed the patient's current medications.  ROS: History obtained from the patient  General ROS: negative for - chills, fatigue, fever, night sweats, weight gain or weight loss Psychological ROS: negative for - behavioral disorder, hallucinations, memory difficulties, mood swings or suicidal ideation Ophthalmic ROS: negative for - blurry vision, double vision, eye pain or loss of vision ENT ROS: negative for - epistaxis, nasal discharge, oral lesions, sore throat, tinnitus or vertigo Allergy and Immunology ROS: negative for - hives or itchy/watery eyes Hematological and Lymphatic ROS: negative for - bleeding problems, bruising or swollen lymph  nodes Endocrine ROS: negative for - galactorrhea, hair pattern changes, polydipsia/polyuria or temperature intolerance Respiratory ROS: negative for - cough, hemoptysis, shortness of breath or wheezing Cardiovascular ROS: negative for - chest pain, dyspnea on exertion, edema or irregular heartbeat Gastrointestinal ROS: negative for - abdominal pain, diarrhea, hematemesis, nausea/vomiting or stool incontinence Genito-Urinary ROS: negative for - dysuria, hematuria, incontinence or urinary frequency/urgency Musculoskeletal ROS: negative for - joint swelling or muscular weakness Neurological ROS: as noted in HPI Dermatological ROS: negative for rash and skin lesion changes  Physical Examination: Blood pressure 120/64, pulse 66, temperature 98.6 F (37 C), temperature source Oral, resp. rate 18, SpO2 98.00%.  Neurologic Examination: Mental Status: Alert, oriented, thought content appropriate.  Speech fluent without evidence of aphasia. Able to follow commands without difficulty. Cranial Nerves: II-Visual fields were normal. III/IV/VI-Pupils were equal and reacted. Extraocular movements were full and conjugate.    V/VII-no facial numbness and no facial weakness. VIII-normal. X-normal speech and symmetrical palatal movement. Motor: Moderate proximal weakness of left lower extremity; motor exam otherwise unremarkable. Sensory: Normal throughout. Deep Tendon Reflexes: 2+ and symmetric. Plantars: Extensor on the left and flexor on the right Cerebellar: Normal finger-to-nose testing bilaterally; moderately impaired heel-to-shin testing with lower extremity. Carotid auscultation: Normal   Assessment: 72 y.o. 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  Stroke Risk Factors - family history, hyperlipidemia and  hypertension  Plan: 1. HgbA1c, fasting lipid panel 2. PT consult, OT consult,  Speech consult 3. Echocardiogram 4. Carotid dopplers 5. Prophylactic therapy-Antiplatelet med: Plavix 75 mg per day 6. Risk factor modification 7. Telemetry monitoring     C.R. Roseanne Reno, MD [facility] [phone]  03/19/2013, 11:20 PM

## 2013-03-19 NOTE — ED Notes (Signed)
POCT CBG was 112

## 2013-03-19 NOTE — H&P (Signed)
[facility] History and Physical  Thomas [name] AVW:[id number] DOB: [birth year] DOA: 03/19/2013   PCP: Carrie Mew, MD   Chief Complaint: left lower extremity weakness  HPI:  72 year old male with a history of coronary artery disease, hypertension, hyperlipidemia,presented with acute onset of left lower extremity weakness that began at 7 PM on 03/18/2013. The patient stated that he has had some gait instability for many years, but has not had left lower extremity weakness. In fact, the patient has had 3 falls within the past year because of his gait instability. He denied any dysarthria, visual disturbance, facial droop, headache, upper extremity weakness, syncope, dizziness. There is no chest pain, shortness breath, palpitations, nausea, vomiting, diarrhea, abdominal pain, dysuria, hematuria, hematochezia, melena, fevers, chills. The patient denies any urinary or bowel incontinence. At the insistence of his wife, the patient came to the emergency department on the morning of 03/19/2013. He stated that his left lower extremity weakness has not changed for the better or worse in the emergency department. CT of the brain was negative for acute findings. Vitals remained stable. BMP was unremarkable except for mildly low sodium of 133. CBC was unremarkable. Hepatic panel was negative. Urine drug screen was negative. Urinalysis showed 21-50 WBCs. the patient was started on ceftriaxone in ED. Assessment/Plan: Left lower extremity weakness -Concerned about stroke versus myelopathy/radiculopathy -MRI brain -MRI lumbar spine -Carotid Dopplers -Echocardiogram -MRA of the brain if the MRI of the brain suggests acute infarct -PT evaluation -Lipids in the morning, hemoglobin A1c -continue aspirin 325 mg daily Hypertension -Continue metoprolol succinate, ACE inhibitor Hyperlipidemia -Continue Lipitor and fish oil -Lipid panel in the morning Tobacco use -Tobacco cessation discussed -One  pack per day x50 years -Nicoderm patch Hyponatremia -Likely due to the patient's HCTZ -Discontinue HCTZ for now Pyuria -Continue ceftriaxone pending urine culture data  Active Problems:   * No active hospital problems. *       Past Medical History  Diagnosis Date  . Coronary artery disease   . Hyperlipidemia   . Hypertension   . Prostatic hypertrophy, benign     with elevated PSA  . MI (myocardial infarction) 2000  . Hx of colonic polyps   . Hyperplastic colon polyp 04/15/2007   Past Surgical History  Procedure Laterality Date  . Coronary artery bypass graft  1999    x4  . Colonoscopy  2008   Social History:  reports that he has quit smoking. He does not have any smokeless tobacco history on file. He reports that he does not drink alcohol or use illicit drugs.   Family History  Problem Relation Age of Onset  . Coronary artery disease Mother   . Stroke Father      Allergies  Allergen Reactions  . Sulfonamide Derivatives     REACTION: Weak,lethargic      Prior to Admission medications   Medication Sig Start Date End Date Taking? Authorizing Provider  aspirin 325 MG tablet Take 325 mg by mouth daily.     Yes Historical Provider, MD  atorvastatin (LIPITOR) 40 MG tablet Take 40 mg by mouth daily.   Yes Historical Provider, MD  co-enzyme Q-10 50 MG capsule Take 50 mg by mouth daily.     Yes Historical Provider, MD  Flax OIL Take 1 capsule by mouth daily.     Yes Historical Provider, MD  folic acid (FOLVITE) 1 MG tablet Take 1 mg by mouth daily.   Yes Historical Provider, MD  metoprolol succinate (TOPROL-XL) 50 MG 24 hr  tablet Take 50 mg by mouth 2 (two) times daily. Take with or immediately following a meal.   Yes Historical Provider, MD  Moexipril-Hydrochlorothiazide 15-12.5 MG TABS Take 1 tablet by mouth daily.   Yes Historical Provider, MD  Omega-3 Fatty Acids (FISH OIL) 1000 MG CAPS Take 1 capsule by mouth daily.     Yes Historical Provider, MD  oxymetazoline  (AFRIN) 0.05 % nasal spray Place 2 sprays into the nose 2 (two) times daily.   Yes Historical Provider, MD    Review of Systems:  Constitutional:  No weight loss, night sweats, Fevers, chills, fatigue.  Head&Eyes: No headache.  No vision loss.  No eye pain or scotoma ENT:  No Difficulty swallowing,Tooth/dental problems,Sore throat,  No ear ache, post nasal drip,  Cardio-vascular:  No chest pain, Orthopnea, PND, swelling in lower extremities,  dizziness, palpitations  GI:  No  abdominal pain, nausea, vomiting, diarrhea, loss of appetite, hematochezia, melena, heartburn, indigestion, Resp:  No shortness of breath with exertion or at rest. No cough. No coughing up of blood .No wheezing.No chest wall deformity  Skin:  no rash or lesions.  GU:  no dysuria, change in color of urine, no urgency or frequency. No flank pain.  Musculoskeletal:  No joint pain or swelling. No decreased range of motion. No back pain.  Psych:  No change in mood or affect. No depression or anxiety. Neurologic: No headache, no dysesthesia, no focal weakness, no vision loss. No syncope  Physical Exam: Filed Vitals:   03/19/13 1015 03/19/13 1023 03/19/13 1045 03/19/13 1434  BP: 152/70 152/70 138/71 150/83  Pulse: 65 62 59 67  Temp:  98.3 F (36.8 C)    TempSrc:  Oral    Resp: 18 13 14 15   SpO2: 99% 99% 99% 100%   General:  A&O x 3, NAD, nontoxic, pleasant/cooperative Head/Eye: No conjunctival hemorrhage, no icterus, [place]/AT, No nystagmus ENT:  No icterus,  No thrush, edentulous, no pharyngeal exudate Neck:  No masses, no lymphadenpathy, no bruits CV:  RRR, no rub, no gallop, no S3 Lung:  CTAB, good air movement, no wheeze, no rhonchi Abdomen: soft/NT, +BS, nondistended, no peritoneal signs Ext: No cyanosis, No rashes, No petechiae, No lymphangitis, No edema Neuro: CNII-XII intact, strength 4/5 in bilateral upper and 4/5 RLE, 4-/5 LLE lower , no dysmetria,sensation intact bilateral  Labs on Admission:   Basic Metabolic Panel:  Recent Labs Lab 03/19/13 1111  NA 133*  K 3.8  CL 95*  CO2 28  GLUCOSE 110*  BUN 8  CREATININE 0.86  CALCIUM 9.7   Liver Function Tests:  Recent Labs Lab 03/19/13 1111  AST 24  ALT 11  ALKPHOS 97  BILITOT 0.6  PROT 7.3  ALBUMIN 3.8   No results found for this basename: LIPASE, AMYLASE,  in the last 168 hours No results found for this basename: AMMONIA,  in the last 168 hours CBC:  Recent Labs Lab 03/19/13 1111  WBC 9.5  NEUTROABS 6.3  HGB 15.2  HCT 42.9  MCV 90.5  PLT 263   Cardiac Enzymes:  Recent Labs Lab 03/19/13 1112  TROPONINI <0.30   BNP: No components found with this basename: POCBNP,  CBG:  Recent Labs Lab 03/19/13 1135  GLUCAP 112*    Radiological Exams on Admission: Ct Head Wo Contrast  03/19/2013   *RADIOLOGY REPORT*  Clinical Data: Weakness left leg  CT HEAD WITHOUT CONTRAST  Technique:  Contiguous axial images were obtained from the base of the skull through  the vertex without contrast.  Comparison: None  Findings: Generalized atrophy.  Chronic microvascular ischemic change throughout the white matter.  No acute infarct.  Chronic infarct left pons.  Negative for acute infarct, hemorrhage, or mass lesion.  No acute skull lesion.  IMPRESSION: Atrophy and chronic microvascular ischemia.  No acute intracranial abnormality.   Original Report Authenticated By: Janeece Riggers, M.D.       Time spent:60 minutes Code Status:   FULL Family Communication:   Family at bedside   Woodrow Dulski, DO  [facility] Pager [phone]  If 7PM-7AM, please contact night-coverage [email or web address] Password [facility] 03/19/2013, 3:15 PM

## 2013-03-19 NOTE — Progress Notes (Signed)
Shift event: HPI: Mr. Dorrance was admitted 8/6 with c/o left lower extremity weakness and chronic gait disturbance. CT head was neg in ED. MRI brain ordered and this NP called by MRI tech stating it looked positive for acute stroke, so MRA done while pt in radiology. Results of tests showed + acute lacunar infarcts and intracranial stenosis. Pt on ASA 325mg .  Neuro on call, Dr. Roseanne Reno, called for consult. He will see pt. Defer need for further anti-platelet med to neuro.  Jimmye Norman, NP [facility]

## 2013-03-20 DIAGNOSIS — I359 Nonrheumatic aortic valve disorder, unspecified: Secondary | ICD-10-CM

## 2013-03-20 DIAGNOSIS — N400 Enlarged prostate without lower urinary tract symptoms: Secondary | ICD-10-CM

## 2013-03-20 DIAGNOSIS — I25810 Atherosclerosis of coronary artery bypass graft(s) without angina pectoris: Secondary | ICD-10-CM

## 2013-03-20 LAB — CBC
HCT: 41.7 % (ref 39.0–52.0)
Hemoglobin: 14.6 g/dL (ref 13.0–17.0)
MCH: 31.5 pg (ref 26.0–34.0)
MCV: 90.1 fL (ref 78.0–100.0)
Platelets: 266 10*3/uL (ref 150–400)
RBC: 4.63 MIL/uL (ref 4.22–5.81)
WBC: 11.3 10*3/uL — ABNORMAL HIGH (ref 4.0–10.5)

## 2013-03-20 LAB — BASIC METABOLIC PANEL
CO2: 27 mEq/L (ref 19–32)
Calcium: 9.4 mg/dL (ref 8.4–10.5)
Chloride: 96 mEq/L (ref 96–112)
Glucose, Bld: 130 mg/dL — ABNORMAL HIGH (ref 70–99)
Potassium: 4.1 mEq/L (ref 3.5–5.1)
Sodium: 132 mEq/L — ABNORMAL LOW (ref 135–145)

## 2013-03-20 LAB — LIPID PANEL
HDL: 44 mg/dL (ref >39)
Total CHOL/HDL Ratio: 2.7 RATIO
VLDL: 21 mg/dL (ref 0–40)

## 2013-03-20 MED ORDER — CLOPIDOGREL BISULFATE 75 MG PO TABS: 75.0000 mg | ORAL_TABLET | Freq: Every day | ORAL | Status: DC

## 2013-03-20 MED ORDER — CLOPIDOGREL BISULFATE 75 MG PO TABS
75.0000 mg | ORAL_TABLET | Freq: Every day | ORAL | Status: DC
  Administered 2013-03-20: 75 mg via ORAL
  Filled 2013-03-20: qty 1

## 2013-03-20 MED ORDER — CIPROFLOXACIN HCL 500 MG PO TABS: 500.0000 mg | ORAL_TABLET | Freq: Two times a day (BID) | ORAL | Status: DC

## 2013-03-20 NOTE — Discharge Summary (Signed)
Physician Discharge Summary  Alexandru Moorer BJY:[id number] DOB: 1941-03-30 DOA: 03/19/2013  PCP: Carrie Mew, MD  Admit date: 03/19/2013 Discharge date: 03/20/2013  Time spent: 30 minutes  Recommendations for Outpatient Follow-up:  Follow up with PCP in 1-2 weeks  Discharge Diagnoses:  Active Problems:   HYPERTENSION   CORONARY ARTERY DISEASE   Left hemiparesis   Tobacco use disorder   CVA (cerebral infarction)   Left leg weakness   Discharge Condition: Stable  Diet recommendation: Heart Healthy  There were no vitals filed for this visit.  History of present illness:  72 year old male with a history of coronary artery disease, hypertension, hyperlipidemia,presented with acute onset of left lower extremity weakness that began at 7 PM on 03/18/2013. The patient stated that he has had some gait instability for many years, but has not had left lower extremity weakness. In fact, the patient has had 3 falls within the past year because of his gait instability. He denied any dysarthria, visual disturbance, facial droop, headache, upper extremity weakness, syncope, dizziness. There is no chest pain, shortness breath, palpitations, nausea, vomiting, diarrhea, abdominal pain, dysuria, hematuria, hematochezia, melena, fevers, chills.  The patient denies any urinary or bowel incontinence. At the insistence of his wife, the patient came to the emergency department on the morning of 03/19/2013. He stated that his left lower extremity weakness has not changed for the better or worse in the emergency department. CT of the brain was negative for acute findings. Vitals remained stable. BMP was unremarkable except for mildly low sodium of 133. CBC was unremarkable. Hepatic panel was negative. Urine drug screen was negative. Urinalysis showed 21-50 WBCs. the patient was started on ceftriaxone in ED.  Hospital Course:  The patient was admitted to the floor. Neurology was consulted. The patient was found  to have R corona radiata, anterior L frontal lobe cortex, and L occipital lobe subcortical white matter lacunar infarcts. The patient is recommended to to continue plavix with aspirin stopped. Carotid dopplers were unremarkable, as was a 2D echo.  Procedures:  2D echo 03/20/13  Carotid dopplers 03/20/13  Consultations:  Neurology  Discharge Exam: Filed Vitals:   03/20/13 0200 03/20/13 0400 03/20/13 0700 03/20/13 1130  BP: 168/82 143/81 142/85 150/76  Pulse: 68 61 62 58  Temp:  98.4 F (36.9 C) 98.2 F (36.8 C) 97.5 F (36.4 C)  TempSrc:  Oral Oral Oral  Resp:    18  SpO2: 100% 99% 100% 100%    General: Awake, in nad Cardiovascular: regular, s1, s2 Respiratory: normal resp effort, no wheezing  Discharge Instructions     Medication List    STOP taking these medications       aspirin 325 MG tablet     oxymetazoline 0.05 % nasal spray  Commonly known as:  AFRIN      TAKE these medications       atorvastatin 40 MG tablet  Commonly known as:  LIPITOR  Take 40 mg by mouth daily.     ciprofloxacin 500 MG tablet  Commonly known as:  CIPRO  Take 1 tablet (500 mg total) by mouth 2 (two) times daily.     clopidogrel 75 MG tablet  Commonly known as:  PLAVIX  Take 1 tablet (75 mg total) by mouth daily with breakfast.     co-enzyme Q-10 50 MG capsule  Take 50 mg by mouth daily.     Fish Oil 1000 MG Caps  Take 1 capsule by mouth daily.     Flax  Oil  Take 1 capsule by mouth daily.     folic acid 1 MG tablet  Commonly known as:  FOLVITE  Take 1 mg by mouth daily.     metoprolol succinate 50 MG 24 hr tablet  Commonly known as:  TOPROL-XL  Take 50 mg by mouth 2 (two) times daily. Take with or immediately following a meal.     Moexipril-Hydrochlorothiazide 15-12.5 MG Tabs  Take 1 tablet by mouth daily.       Allergies  Allergen Reactions  . Sulfonamide Derivatives     REACTION: Weak,lethargic   Follow-up Information   Follow up with Gates Rigg,  MD. Schedule an appointment as soon as possible for a visit in 2 months. (stroke clinic)    Contact information:   [address] [place] [address] [phone]       Follow up with Carrie Mew, MD In 1 week.   Contact information:   [address] [place]ake [place] [address] [phone]        The results of significant diagnostics from this hospitalization (including imaging, microbiology, ancillary and laboratory) are listed below for reference.    Significant Diagnostic Studies: Ct Head Wo Contrast  03/19/2013   *RADIOLOGY REPORT*  Clinical Data: Weakness left leg  CT HEAD WITHOUT CONTRAST  Technique:  Contiguous axial images were obtained from the base of the skull through the vertex without contrast.  Comparison: None  Findings: Generalized atrophy.  Chronic microvascular ischemic change throughout the white matter.  No acute infarct.  Chronic infarct left pons.  Negative for acute infarct, hemorrhage, or mass lesion.  No acute skull lesion.  IMPRESSION: Atrophy and chronic microvascular ischemia.  No acute intracranial abnormality.   Original Report Authenticated By: Janeece Riggers, M.D.   Mr [facility] Wo Contrast  03/19/2013   *RADIOLOGY REPORT*  Clinical Data:  72 year old male with acute onset left lower extremity weakness beginning at 1900 hours on 03/18/2013.  History of gait instability.  Comparison: Head CT without contrast 03/19/2013.  MRI HEAD WITHOUT CONTRAST  Technique: Multiplanar, multiecho pulse sequences of the brain and surrounding structures were obtained according to standard protocol without intravenous contrast.  Findings: Sub centimeter foci of restricted diffusion in the right corona radiata above the posterior limb of the right internal capsule (series 4 image 17), in the anterior left frontal lobe cortex (series 4 image 15), and in the left occipital pole medial subcortical white matter (image 14).  No associated mass effect or hemorrhage at  these sites. Major intracranial vascular flow voids are preserved.  Superimposed confluent cerebral white matter T2 and FLAIR hyperintensity with multiple chronic deep gray matter nuclei lacunar infarct, chronic left paracentral pons lacunar infarct with surrounding patchy T2 and FLAIR hyperintensity, and occasional chronic lacunar infarcts in the cerebellum.  Ventricular prominence appears to be ex vacuo related. No midline shift, mass effect, or evidence of mass lesion.  No extra-axial collection.  Negative pituitary, cervicomedullary junction and visualized cervical spine.  Normal bone marrow signal.  Mild left mastoid effusion.  Layering secretions in the pharynx. Fluid level on the right maxillary sinus.  Mild ethmoid sinus mucosal thickening.  Postoperative changes to the globes.  Negative scalp soft tissues.  IMPRESSION: 1.  Acute lacunar infarcts in the right corona radiata (favor this is the symptomatic lesion), anterior left frontal lobe cortex, and left occipital lobe subcortical white matter.  No associated mass effect or hemorrhage.  Favor these represent synchronous small vessel ischemia - see #  2. 2.  Advanced chronic small vessel ischemia especially affecting the cerebral white matter, deep gray matter nuclei, and brain stem. 3.  See MRA findings below. 4.  Mild left mastoid effusion.  Layering secretions in the pharynx.  Right maxillary sinus fluid level.  MRA HEAD WITHOUT CONTRAST  Technique: Angiographic images of the Circle of Willis were obtained using MRA technique without  intravenous contrast.  Findings: Antegrade flow in the posterior circulation.  Moderate to severe irregularity of the distal vertebral arteries.  The distal right vertebral artery is nondominant.  Both PICA origins are patent.  There is severe stenosis of the right vertebral artery just proximal to the vertebrobasilar junction.  Moderate basilar artery irregularity.  Mild mid basilar artery stenosis, does not appear  hemodynamically significant.  SCA and PCA origins are patent.  Combined left P1 and posterior communicating artery left PCA supply.  Mild to moderate irregularity of distal PCA branches bilaterally with preserved distal flow.  Antegrade flow in both ICA siphons.  Right ICA atherosclerosis without stenosis.  On the left there is irregularity and ectasia of the left cavernous ICA segment.  Favor atherosclerotic pseudo- lesion, although a 4 mm cavernous segment aneurysm is possible (series 1005 image 11).  Mild to moderate stenosis at the left ICA anterior genu.  Mild bilateral supraclinoid ICA stenosis.  Both ophthalmic artery origins are within normal limits.  Carotid termini are patent.  MCA and ACA origins are patent. Anterior communicating artery and visualized ACA branches are within normal limits.  MCA M1 segment are patent with mild irregularity.  Both MCA bifurcations are patent.  No major MCA branch occlusion identified on either side.  No focal M2 segment stenosis identified.  IMPRESSION: 1.  Severe intracranial atherosclerosis, mostly effecting large vessels.  High-grade stenosis of the nondominant right vertebral artery just proximal to the basilar.  Mild to moderate mid basilar artery and bilateral ICA siphon stenosis. 2.  Ectatic left cavernous ICA could reflect atherosclerotic pseudo- lesion or a 4 mm cavernous segment aneurysm (rupture of which should result in carotid-cavernous fistula rather than subarachnoid hemorrhage). 3.  No major circle of Willis branch occlusion.   Original Report Authenticated By: Erskine Speed, M.D.   Mr Brain Wo Contrast  03/19/2013   *RADIOLOGY REPORT*  Clinical Data:  72 year old male with acute onset left lower extremity weakness beginning at 1900 hours on 03/18/2013.  History of gait instability.  Comparison: Head CT without contrast 03/19/2013.  MRI HEAD WITHOUT CONTRAST  Technique: Multiplanar, multiecho pulse sequences of the brain and surrounding structures were  obtained according to standard protocol without intravenous contrast.  Findings: Sub centimeter foci of restricted diffusion in the right corona radiata above the posterior limb of the right internal capsule (series 4 image 17), in the anterior left frontal lobe cortex (series 4 image 15), and in the left occipital pole medial subcortical white matter (image 14).  No associated mass effect or hemorrhage at these sites. Major intracranial vascular flow voids are preserved.  Superimposed confluent cerebral white matter T2 and FLAIR hyperintensity with multiple chronic deep gray matter nuclei lacunar infarct, chronic left paracentral pons lacunar infarct with surrounding patchy T2 and FLAIR hyperintensity, and occasional chronic lacunar infarcts in the cerebellum.  Ventricular prominence appears to be ex vacuo related. No midline shift, mass effect, or evidence of mass lesion.  No extra-axial collection.  Negative pituitary, cervicomedullary junction and visualized cervical spine.  Normal bone marrow signal.  Mild left mastoid effusion.  Layering secretions in  the pharynx. Fluid level on the right maxillary sinus.  Mild ethmoid sinus mucosal thickening.  Postoperative changes to the globes.  Negative scalp soft tissues.  IMPRESSION: 1.  Acute lacunar infarcts in the right corona radiata (favor this is the symptomatic lesion), anterior left frontal lobe cortex, and left occipital lobe subcortical white matter.  No associated mass effect or hemorrhage.  Favor these represent synchronous small vessel ischemia - see #2. 2.  Advanced chronic small vessel ischemia especially affecting the cerebral white matter, deep gray matter nuclei, and brain stem. 3.  See MRA findings below. 4.  Mild left mastoid effusion.  Layering secretions in the pharynx.  Right maxillary sinus fluid level.  MRA HEAD WITHOUT CONTRAST  Technique: Angiographic images of the Circle of Willis were obtained using MRA technique without  intravenous  contrast.  Findings: Antegrade flow in the posterior circulation.  Moderate to severe irregularity of the distal vertebral arteries.  The distal right vertebral artery is nondominant.  Both PICA origins are patent.  There is severe stenosis of the right vertebral artery just proximal to the vertebrobasilar junction.  Moderate basilar artery irregularity.  Mild mid basilar artery stenosis, does not appear hemodynamically significant.  SCA and PCA origins are patent.  Combined left P1 and posterior communicating artery left PCA supply.  Mild to moderate irregularity of distal PCA branches bilaterally with preserved distal flow.  Antegrade flow in both ICA siphons.  Right ICA atherosclerosis without stenosis.  On the left there is irregularity and ectasia of the left cavernous ICA segment.  Favor atherosclerotic pseudo- lesion, although a 4 mm cavernous segment aneurysm is possible (series 1005 image 11).  Mild to moderate stenosis at the left ICA anterior genu.  Mild bilateral supraclinoid ICA stenosis.  Both ophthalmic artery origins are within normal limits.  Carotid termini are patent.  MCA and ACA origins are patent. Anterior communicating artery and visualized ACA branches are within normal limits.  MCA M1 segment are patent with mild irregularity.  Both MCA bifurcations are patent.  No major MCA branch occlusion identified on either side.  No focal M2 segment stenosis identified.  IMPRESSION: 1.  Severe intracranial atherosclerosis, mostly effecting large vessels.  High-grade stenosis of the nondominant right vertebral artery just proximal to the basilar.  Mild to moderate mid basilar artery and bilateral ICA siphon stenosis. 2.  Ectatic left cavernous ICA could reflect atherosclerotic pseudo- lesion or a 4 mm cavernous segment aneurysm (rupture of which should result in carotid-cavernous fistula rather than subarachnoid hemorrhage). 3.  No major circle of Willis branch occlusion.   Original Report Authenticated  By: Erskine Speed, M.D.   Mr Lumbar Spine W Wo Contrast  03/19/2013   *RADIOLOGY REPORT*  Clinical Data: 72 year old male with left lower extremity weakness. Gait instability.  Left lower extremity dragging.  MRI LUMBAR SPINE WITHOUT AND WITH CONTRAST  Technique:  Multiplanar and multiecho pulse sequences of the lumbar spine were obtained without and with intravenous contrast.  Contrast: 14mL MULTIHANCE GADOBENATE DIMEGLUMINE 529 MG/ML IV SOLN  Comparison: None.  Findings: Lumbar segmentation appears to be normal and will be designated as such for the purposes of this report.  Moderate chronic L4 compression fracture.  See L3-L4 level findings below.  Mild T12 and L1 vertebral body wedging, favor congenital rather than related to mild remote compression fractures.  No marrow edema or evidence of acute osseous abnormality.  Other lumbar levels are intact.  Bone marrow signal is heterogeneous throughout the visible spine and  pelvis, but no suspicious marrow lesion is identified.  Diverticulosis of the distal colon.  The bladder appears mildly to moderately distended.  Cholelithiasis.  Ectasia of the visible abdominal aorta and iliac arteries.  The infrarenal aorta measures up to 33 mm diameter.  Visualized paraspinal soft tissues are within normal limits.   Visualized lower thoracic spinal cord is normal with conus medularis at L1-L2.  T11-T12:  Mild disc bulge.  Mild ligament flavum hypertrophy.  Mild spinal stenosis.  T12-L1:  Negative.  L1-L2:  Mild disc desiccation and disc bulge.  Mild facet hypertrophy.  Mild L1 foraminal stenosis.  L2-L3:  Mild circumferential disc bulge.  Mild to moderate facet hypertrophy.  No significant stenosis.  L3-L4:  Circumferential disc osteophyte complex in part related to mild retropulsion of the posterior-superior L4 endplate.  Moderate facet and ligament flavum hypertrophy.  Moderate spinal stenosis. Moderate to severe left L3 foraminal stenosis related to disc, endplate, and  facet spurring.  Trace facet joint fluid.  No definite synovial cyst.  L4-L5:  Mild to moderate circumferential disc osteophyte complex. Moderate facet and ligament flavum hypertrophy.  Moderate spinal stenosis and mild right greater than left lateral recess stenosis. Mild to moderate bilateral L4 foraminal stenosis.  L5-S1:  Mild left paracentral annular tear.  No disc herniation. Moderate to severe facet hypertrophy.  Fluid in the left facet joint.  Anteriorly arising bilateral facet joint synovial cyst extending into the bilateral L5 neural foramina, worse on the right (series 7 image 10 and series 8 image 28 and 29). No spinal or lateral recess stenosis.  Moderate left and mild to moderate right L5 foraminal stenosis.  IMPRESSION: 1.  No acute osseous abnormality in the lumbar spine.  Chronic L4 compression fracture. 2.  Lumbar degenerative changes with multifactorial moderate spinal stenosis at L3-L4 and L4-L5. Mild multifactorial spinal stenosis at T11-T12. 3.  Moderate to severe multifactorial left L3 foraminal stenosis, and left greater than right L5 foraminal stenosis - the latter related to degenerative L5-S1 synovial cysts projecting into the neural foramen. 4.  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 Recommend followup by [place] in 3 years.  This recommendation follows ACR consensus guidelines: White Paper of the ACR Incidental Findings Committee II on Vascular Findings.  J Am Coll Radiol 2013; 10:789-794. 5.  Cholelithiasis.  Distal colon diverticulosis.  Mild bladder distention.   Original Report Authenticated By: Erskine Speed, M.D.    Microbiology: No results found for this or any previous visit (from the past 240 hour(s)).   Labs: Basic Metabolic Panel:  Recent Labs Lab 03/19/13 1111 03/20/13 0720  NA 133* 132*  K 3.8 4.1  CL 95* 96  CO2 28 27  GLUCOSE 110* 130*  BUN 8 7  CREATININE 0.86 0.88  CALCIUM 9.7 9.4   Liver Function Tests:  Recent  Labs Lab 03/19/13 1111  AST 24  ALT 11  ALKPHOS 97  BILITOT 0.6  PROT 7.3  ALBUMIN 3.8   No results found for this basename: LIPASE, AMYLASE,  in the last 168 hours No results found for this basename: AMMONIA,  in the last 168 hours CBC:  Recent Labs Lab 03/19/13 1111 03/20/13 0720  WBC 9.5 11.3*  NEUTROABS 6.3  --   HGB 15.2 14.6  HCT 42.9 41.7  MCV 90.5 90.1  PLT 263 266   Cardiac Enzymes:  Recent Labs Lab 03/19/13 1112  TROPONINI <0.30   BNP: BNP (last 3 results) No results found  for this basename: PROBNP,  in the last 8760 hours CBG:  Recent Labs Lab 03/19/13 1135  GLUCAP 112*       Signed:  CHIU, STEPHEN K  [facility] 03/20/2013, 2:53 PM

## 2013-03-20 NOTE — Progress Notes (Signed)
  Echocardiogram 2D Echocardiogram has been performed.  Thomas [name] 03/20/2013, 9:10 AM

## 2013-03-20 NOTE — Care Management (Signed)
Care Manager did fax order for RW to [employer]- in order for insurance to pay for it. CM will also fax information to the [facility] for Outpatient PT/OT services. Pt is aware and agreeable. RN is aware that RW will be delivered to pt's room before d/c.  No further needs from CM at this time. Gala Lewandowsky , RN, BSN [phone]

## 2013-03-20 NOTE — Progress Notes (Signed)
Stroke Team Progress Note  HISTORY Thomas [name] is an 72 y.o. male with a history of hypertension, hyperlipidemia and coronary artery disease as well as TIA, presenting 03/19/2013 with new onset weakness involving left lower extremity. Onset was at 7 PM on 03/18/2013. He did not develop weakness of his left upper extremity nor facial weakness. There was no change in speech. Patient has been on aspirin 81 mg per day. CT scan of his head showed no acute intracranial abnormality. MRI showed an acute small right corona radiata ischemic stroke. There also small ischemic strokes involving left frontal and occipital areas. NIH stroke score was 2. Patient was not a TPA candidate secondary to delay in arrival. He was admitted for further evaluation and treatment.  SUBJECTIVE His wife is at the bedside.  Overall he feels his condition is rapidly improving. He is concerned about getting his medications at time of discharge.  OBJECTIVE Most recent Vital Signs: Filed Vitals:   03/20/13 0000 03/20/13 0200 03/20/13 0400 03/20/13 0700  BP: 128/78 168/82 143/81 142/85  Pulse: 65 68 61 62  Temp:   98.4 F (36.9 C) 98.2 F (36.8 C)  TempSrc:   Oral Oral  Resp:      SpO2: 98% 100% 99% 100%   CBG (last 3)   Recent Labs  03/19/13 1135  GLUCAP 112*    IV Fluid Intake:     MEDICATIONS  . aspirin  325 mg Oral Daily  . atorvastatin  40 mg Oral Daily  . cefTRIAXone (ROCEPHIN)  IV  1 g Intravenous Q24H  . clopidogrel  75 mg Oral Q breakfast  . enoxaparin (LOVENOX) injection  40 mg Subcutaneous Q24H  . folic acid  1 mg Oral Daily  . lisinopril  5 mg Oral Daily  . metoprolol succinate  50 mg Oral BID  . nicotine  21 mg Transdermal Daily  . omega-3 acid ethyl esters  1 g Oral BID  . sodium chloride  3 mL Intravenous Q12H   PRN:  acetaminophen, acetaminophen, ondansetron (ZOFRAN) IV, ondansetron  Diet:  Cardiac thin liquids Activity:  Bedrest with Bathroom privileges DVT Prophylaxis:  Lovenox 40 mg sq  daily   CLINICALLY SIGNIFICANT STUDIES Basic Metabolic Panel:   Recent Labs Lab 03/19/13 1111 03/20/13 0720  NA 133* 132*  K 3.8 4.1  CL 95* 96  CO2 28 27  GLUCOSE 110* 130*  BUN 8 7  CREATININE 0.86 0.88  CALCIUM 9.7 9.4   Liver Function Tests:   Recent Labs Lab 03/19/13 1111  AST 24  ALT 11  ALKPHOS 97  BILITOT 0.6  PROT 7.3  ALBUMIN 3.8   CBC:   Recent Labs Lab 03/19/13 1111 03/20/13 0720  WBC 9.5 11.3*  NEUTROABS 6.3  --   HGB 15.2 14.6  HCT 42.9 41.7  MCV 90.5 90.1  PLT 263 266   Coagulation:   Recent Labs Lab 03/19/13 1249  LABPROT 14.1  INR 1.11   Cardiac Enzymes:   Recent Labs Lab 03/19/13 1112  TROPONINI <0.30   Urinalysis:   Recent Labs Lab 03/19/13 1142  COLORURINE YELLOW  LABSPEC 1.008  PHURINE 8.0  GLUCOSEU NEGATIVE  HGBUR NEGATIVE  BILIRUBINUR NEGATIVE  KETONESUR NEGATIVE  PROTEINUR NEGATIVE  UROBILINOGEN 1.0  NITRITE NEGATIVE  LEUKOCYTESUR LARGE*   Lipid Panel    Component Value Date/Time   CHOL 120 03/20/2013 0720   TRIG 104 03/20/2013 0720   HDL 44 03/20/2013 0720   CHOLHDL 2.7 03/20/2013 0720   VLDL 21  03/20/2013 0720   LDLCALC 55 03/20/2013 0720   HgbA1C  Lab Results  Component Value Date   HGBA1C 5.9* 03/19/2013    Urine Drug Screen:     Component Value Date/Time   LABOPIA NONE DETECTED 03/19/2013 1142   COCAINSCRNUR NONE DETECTED 03/19/2013 1142   LABBENZ NONE DETECTED 03/19/2013 1142   AMPHETMU NONE DETECTED 03/19/2013 1142   THCU NONE DETECTED 03/19/2013 1142   LABBARB NONE DETECTED 03/19/2013 1142    Alcohol Level:   Recent Labs Lab 03/19/13 1111  ETH <11    Mr Lumbar Spine   03/19/2013  1.  No acute osseous abnormality in the lumbar spine.  Chronic L4 compression fracture. 2.  Lumbar degenerative changes with multifactorial moderate spinal stenosis at L3-L4 and L4-L5. Mild multifactorial spinal stenosis at T11-T12. 3.  Moderate to severe multifactorial left L3 foraminal stenosis, and left greater than right L5  foraminal stenosis - the latter related to degenerative L5-S1 synovial cysts projecting into the neural foramen. 4.  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 Recommend followup by [place] in 3 years.     CT of the brain  03/19/2013   Atrophy and chronic microvascular ischemia.  No acute intracranial abnormality.   MRI of the brain  03/19/2013   1.  Acute lacunar infarcts in the right corona radiata (favor this is the symptomatic lesion), anterior left frontal lobe cortex, and left occipital lobe subcortical white matter.  No associated mass effect or hemorrhage.  Favor these represent synchronous small vessel ischemia - see #2. 2.  Advanced chronic small vessel ischemia especially affecting the cerebral white matter, deep gray matter nuclei, and brain stem. 3.  See MRA findings below. 4.  Mild left mastoid effusion.  Layering secretions in the pharynx.  Right maxillary sinus fluid level.    MRA of the brain  03/19/2013  1.  Severe intracranial atherosclerosis, mostly effecting large vessels.  High-grade stenosis of the nondominant right vertebral artery just proximal to the basilar.  Mild to moderate mid basilar artery and bilateral ICA siphon stenosis. 2.  Ectatic left cavernous ICA could reflect atherosclerotic pseudo- lesion or a 4 mm cavernous segment aneurysm (rupture of which should result in carotid-cavernous fistula rather than subarachnoid hemorrhage). 3.  No major circle of Willis branch occlusion.    2D Echocardiogram    Carotid Doppler  No evidence of hemodynamically significant internal carotid artery stenosis. Vertebral artery flow is antegrade.   CXR    EKG  normal sinus rhythm   Therapy Recommendations   Physical Exam   Pleasant middle aged male not in distress.Awake alert. Afebrile. Head is nontraumatic. Neck is supple without bruit. Hearing is normal. Cardiac exam no murmur or gallop. Lungs are clear to auscultation. Distal pulses are well  felt. Neurological Exam ; Awake alert oriented x 3 normal speech and language. Mild left lower face asymmetry. Tongue midline. No drift. Mild diminished fine finger movements on left. Orbits right over left upper extremity. Mild left grip weak.. Normal sensation . Normal coordination. ASSESSMENT Thomas [name] is a 72 y.o. male presenting with new onset left lower extremity weakness. Imaging confirms right corona radiata, anterior left frontal lobe cortex, and left occipital lobe subcortical white matter lacunar infarcts. Infarcts felt to be thrombotic secondary to small vessel disease.  On aspirin 325 mg orally every day prior to admission. Now on aspirin 325 mg orally every day and clopidogrel 75 mg orally every day for secondary stroke prevention.  Patient with resultant left hemiparesis. Work up underway.  Hyperlipidemia, LDL 55, on zocor 40 mg PTA, now on liipitor 40, goal LDL < 100 Hypertension CAD, CABG 1999 Hx TIA 2003 Pyuria, on rocephin Cigarette smoker, on Nicoderm patch Family history of stroke (father)  Hospital day # 1  TREATMENT/PLAN  Continue clopidogrel 75 mg orally every day for secondary stroke prevention. Stopped aspirin. Dual antiplatelets not recommended together longterm.  Stop smoking  F/u 2D echo  Therapy evals - followup per their recommendations  Ok for discharge once workup completed, therapy cleared  Will need follow up with Dr. Pearlean Brownie in 2 months.  Annie Main, MSN, RN, ANVP-BC, ANP-BC, [facility] Pager: [phone] 03/20/2013 9:47 AM  I have personally obtained a history, examined the patient, evaluated imaging results, and formulated the assessment and plan of care. I agree with the above. Delia Heady, MD

## 2013-03-20 NOTE — Evaluation (Signed)
Physical Therapy Evaluation Patient Details Name: Thomas [name] MRN: [id number] DOB: [birth year] Today's Date: 03/20/2013 Time: 0865-7846 PT Time Calculation (min): 21 min  PT Assessment / Plan / Recommendation History of Present Illness  72 y.o. male admitted to [facility] on 03/19/13 with with a history of hypertension, hyperlipidemia and coronary artery disease as well as TIA, with new onset weakness involving left lower extremity. Onset was at 7 PM on 03/18/2013. He did not develop weakness of his left upper extremity nor facial weakness. There was no change in speech.  CT (-) and MRI showed an acute small right corona radiata ischemic stroke. There are small ischemic strokes involving left frontal and occipital areas. NIH stroke score was 2. Patient was not a TPA candidate secondary to delay in arrival.   Clinical Impression  The pt is currently at very high risk of falls due to left leg weakness and decreased balance.  He would benefit from a RW for gait an f/u therapy at OP for balance, gait, and strength training.      PT Assessment  Patient needs continued PT services    Follow Up Recommendations  Outpatient PT;Supervision/Assistance - 24 hour    Does the patient have the potential to tolerate intense rehabilitation     Yes  Barriers to Discharge   None None    Equipment Recommendations  Rolling walker with 5" wheels    Recommendations for Other Services   None  Frequency Min 4X/week    Precautions / Restrictions Precautions Precautions: Fall Precaution Comments: due to left leg weakness and increased toe drag Required Braces or Orthoses:  (will likely need AFO, but that can be done as an outpatient) Restrictions Weight Bearing Restrictions: No   Pertinent Vitals/Pain See vitals flow sheet.      Mobility  Bed Mobility Bed Mobility: Supine to Sit;Sitting - Scoot to Edge of Bed Supine to Sit: 6: Modified independent (Device/Increase time);With rails;HOB elevated Sitting -  Scoot to Edge of Bed: 6: Modified independent (Device/Increase time);With rail Details for Bed Mobility Assistance: pt used railing, but did not seem to be dependent on it to get to EOB.   Transfers Transfers: Sit to Stand;Stand to Sit Sit to Stand: 4: Min assist;With upper extremity assist;From bed Stand to Sit: 4: Min assist;With upper extremity assist;To bed Details for Transfer Assistance: min assist to steady pt for balance as he tends to lean posteiorly during transitions.  Bil legs relying on bed for support/balance.  Uncontrolled descent to sit.   Ambulation/Gait Ambulation/Gait Assistance: 4: Min assist Ambulation Distance (Feet): 150 Feet Assistive device: Rolling walker;1 person hand held assist (150 with RW, 15' with hand held assist) Ambulation/Gait Assistance Details: Per wife's report PTA pt did not use an assistive device for gait, so I assessed gait with one person hand held assist and he was a heavy min assist.  With RW  he could be a lighter min assist with increase to heavier min assist with LOB.  Pt internally rotates, adducts left leg and also has increased toe drag with fatigue on the left side. RW provides more stabilty when he does catch his left toe or hook his left foot behind his right leg while walking.  Verbal cues for safety (to slow gait speed) and to try to keep legs separated during gait.   Gait Pattern: Step-through pattern;Decreased dorsiflexion - left;Narrow base of support General Gait Details: Pt is much safer with RW and needs both upper extremities supported.  He would be too  unstable with just the support of a cane right now.   Modified Rankin (Stroke Patients Only) Pre-Morbid Rankin Score: Moderate disability Modified Rankin: Moderately severe disability        PT Diagnosis: Difficulty walking;Abnormality of gait;Hemiplegia non-dominant side  PT Problem List: Decreased strength;Decreased activity tolerance;Decreased balance;Decreased mobility;Decreased  coordination PT Treatment Interventions: DME instruction;Gait training;Stair training;Functional mobility training;Therapeutic activities;Therapeutic exercise;Balance training;Neuromuscular re-education;Patient/family education;Cognitive remediation     PT Goals(Current goals can be found in the care plan section) Acute Rehab PT Goals Patient Stated Goal: pt/wife would like for him to stop falling PT Goal Formulation: With patient/family Time For Goal Achievement: 04/03/13 Potential to Achieve Goals: Good  Visit Information  Last PT Received On: 03/20/13 Assistance Needed: +1 History of Present Illness: 72 y.o. male admitted to [facility] on 03/19/13 with with a history of hypertension, hyperlipidemia and coronary artery disease as well as TIA, with new onset weakness involving left lower extremity. Onset was at 7 PM on 03/18/2013. He did not develop weakness of his left upper extremity nor facial weakness. There was no change in speech.  CT (-) and MRI showed an acute small right corona radiata ischemic stroke. There are small ischemic strokes involving left frontal and occipital areas. NIH stroke score was 2. Patient was not a TPA candidate secondary to delay in arrival.        Prior Functioning  Home Living Family/patient expects to be discharged to:: Private residence Living Arrangements: Spouse/significant other Available Help at Discharge: Family;Available 24 hours/day (wife reports she can work from home if needed) Type of Home: House Home Access: Stairs to enter [employer] of Steps: 2 Entrance Stairs-Rails: None (pt reports he can grab to door frame and heavy tool cabinet) Home Layout: Two level;1/2 bath on main level;Able to live on main level with bedroom/bathroom (construction to get full bathroom downstairs) Alternate Level Stairs-Number of Steps: 14 Alternate Level Stairs-Rails: Right Home Equipment: Cane - single point Additional Comments: Wife reports recent h/o 3  falls- on stairs, in bathroom, and out of bed.   Prior Function Level of Independence: Independent Comments: Per wife becoming much less so over the past few months Communication Communication: No difficulties    Cognition  Cognition Arousal/Alertness: Awake/alert Behavior During Therapy: WFL for tasks assessed/performed Overall Cognitive Status: Impaired/Different from baseline Area of Impairment: Memory Memory: Decreased short-term memory General Comments: pt was very reliant on wife to answer history questions    Extremity/Trunk Assessment Upper Extremity Assessment Upper Extremity Assessment: Overall WFL for tasks assessed (did not notice any differences with gross MMT left vs right ) Lower Extremity Assessment Lower Extremity Assessment: LLE deficits/detail LLE Deficits / Details: dorsiflexion 3/5, PF. 3+/5, knee ext 4/5, knee flexion 4/5, hip flexion 4/5.   LLE Coordination: decreased gross motor Cervical / Trunk Assessment Cervical / Trunk Assessment: Normal      End of Session PT - End of Session Activity Tolerance: Patient limited by fatigue Patient left: in bed;with call bell/phone within reach;with family/visitor present (seated EOB with wife)    Lurena Joiner B. Dakota Vanwart, PT, DPT [phone]   03/20/2013, 12:19 PM

## 2013-03-20 NOTE — ED Provider Notes (Signed)
Medical screening examination/treatment/procedure(s) were performed by non-physician practitioner and as supervising physician I was immediately available for consultation/collaboration.  Charles B. Sheldon, MD 03/20/13 0714 

## 2013-03-20 NOTE — Progress Notes (Signed)
VASCULAR LAB PRELIMINARY  PRELIMINARY  PRELIMINARY  PRELIMINARY  Carotid duplex completed.    Preliminary report:  Bilateral:  Mild calcific plaque.  1-39% ICA stenosis.  Vertebral artery flow is antegrade.      Brandis Wixted, RVT 03/20/2013, 9:20 AM

## 2013-03-21 LAB — URINE CULTURE

## 2013-03-24 ENCOUNTER — Telehealth: Payer: Self-pay | Admitting: Nurse Practitioner

## 2013-03-24 NOTE — Telephone Encounter (Signed)
Called pt back spoke w/pt's wife confirmed apt with Heide Guile.

## 2013-03-25 ENCOUNTER — Ambulatory Visit (INDEPENDENT_AMBULATORY_CARE_PROVIDER_SITE_OTHER): Payer: Medicare PPO | Admitting: Family

## 2013-03-25 ENCOUNTER — Encounter: Payer: Self-pay | Admitting: Family

## 2013-03-25 VITALS — BP 120/82 | HR 78 | Wt 154.0 lb

## 2013-03-25 DIAGNOSIS — F17200 Nicotine dependence, unspecified, uncomplicated: Secondary | ICD-10-CM

## 2013-03-25 DIAGNOSIS — I10 Essential (primary) hypertension: Secondary | ICD-10-CM

## 2013-03-25 DIAGNOSIS — I639 Cerebral infarction, unspecified: Secondary | ICD-10-CM

## 2013-03-25 DIAGNOSIS — E7800 Pure hypercholesterolemia, unspecified: Secondary | ICD-10-CM

## 2013-03-25 DIAGNOSIS — I6350 Cerebral infarction due to unspecified occlusion or stenosis of unspecified cerebral artery: Secondary | ICD-10-CM

## 2013-03-25 DIAGNOSIS — Z720 Tobacco use: Secondary | ICD-10-CM

## 2013-03-25 NOTE — Patient Instructions (Signed)

## 2013-03-25 NOTE — Progress Notes (Signed)
Subjective:    Patient ID: Thomas [name], male    DOB: 1941/06/24, 72 y.o.   MRN: [id number]  HPI  72 year old white male, smoker, patient of Dr. Lovell Sheehan as in status post hospitalization for right corona radiata ischemia, CVA confirmed by MRI on 03/19/2013. He was left with residual left-sided weakness that has improved significantly. He has a followup with neurology 05/19/2013. Wife reports that he still occasionally tracks his foot but is doing better. He has not seen a primary care providers in 2010. He is currently on Nicoderm CQ to help with smoking cessation. He is currently ambulating with a walker as advised by neurology. He is undergoing physical therapy. Patient does not believe he needs a walker.  Review of Systems  HENT: Negative.   Respiratory: Negative.   Cardiovascular: Negative.   Gastrointestinal: Negative.   Genitourinary: Negative.   Musculoskeletal: Negative.   Skin: Negative.   Neurological: Positive for weakness. Negative for tremors and light-headedness.       Mild left side weakness.   Hematological: Negative.   Psychiatric/Behavioral: Negative.    Past Medical History  Diagnosis Date  . Coronary artery disease   . Hyperlipidemia   . Hypertension   . Prostatic hypertrophy, benign     with elevated PSA  . Hx of colonic polyps   . Hyperplastic colon polyp 04/15/2007  . CHF (congestive heart failure) 1999  . TIA (transient ischemic attack) 2003    "mini stroke" (03/19/2013)    History   Social History  . Marital Status: Married    Spouse Name: N/A    Number of Children: N/A  . Years of Education: N/A   Occupational History  . Not on file.   Social History Main Topics  . Smoking status: Current Some Day Smoker -- 0.75 packs/day for 50 years    Types: Cigarettes  . Smokeless tobacco: Never Used  . Alcohol Use: 0.6 oz/week    1 Cans of beer per week     Comment: 03/19/2013 "1 beer/week"  . Drug Use: No  . Sexual Activity: Not Currently   Other  Topics Concern  . Not on file   Social History Narrative  . No narrative on file    Past Surgical History  Procedure Laterality Date  . Colonoscopy  2008  . Coronary artery bypass graft  1999    x4  . Cardiac catheterization    . Cataract extraction w/ intraocular lens  implant, bilateral Bilateral 1998    Family History  Problem Relation Age of Onset  . Coronary artery disease Mother   . Stroke Father     Allergies  Allergen Reactions  . Sulfonamide Derivatives     REACTION: Weak,lethargic    Current Outpatient Prescriptions on File Prior to Visit  Medication Sig Dispense Refill  . atorvastatin (LIPITOR) 40 MG tablet Take 40 mg by mouth daily.      . ciprofloxacin (CIPRO) 500 MG tablet Take 1 tablet (500 mg total) by mouth 2 (two) times daily.  10 tablet  0  . clopidogrel (PLAVIX) 75 MG tablet Take 1 tablet (75 mg total) by mouth daily with breakfast.  30 tablet  0  . co-enzyme Q-10 50 MG capsule Take 50 mg by mouth daily.        . Flax OIL Take 1 capsule by mouth daily.        . folic acid (FOLVITE) 1 MG tablet Take 1 mg by mouth daily.      . metoprolol  succinate (TOPROL-XL) 50 MG 24 hr tablet Take 50 mg by mouth 2 (two) times daily. Take with or immediately following a meal.      . Moexipril-Hydrochlorothiazide 15-12.5 MG TABS Take 1 tablet by mouth daily.      . Omega-3 Fatty Acids (FISH OIL) 1000 MG CAPS Take 1 capsule by mouth daily.         No current facility-administered medications on file prior to visit.    BP 120/82  Pulse 78  Wt 154 lb (69.854 kg)  BMI 20.88 kg/m2  SpO2 98%chart    Objective:   Physical Exam  Constitutional: He is oriented to person, place, and time. He appears well-developed and well-nourished.  HENT:  Right Ear: External ear normal.  Left Ear: External ear normal.  Nose: Nose normal.  Mouth/Throat: Oropharynx is clear and moist.  Neck: Normal range of motion. Neck supple.  Cardiovascular: Normal rate, regular rhythm and normal  heart sounds.   Pulmonary/Chest: Effort normal and breath sounds normal.  Abdominal: Soft. Bowel sounds are normal.  Musculoskeletal: Normal range of motion.  Neurological: He is alert and oriented to person, place, and time. He has normal reflexes. He displays no atrophy, no tremor and normal reflexes. No sensory deficit.  Mild weakness noted with grip strength on the left. Left weakness with flexion of the left leg-mild.   Skin: Skin is warm and dry.  Psychiatric: He has a normal mood and affect.          Assessment & Plan:  Assessment: 1. Status post CVA 2. Left IMA paresis 3. Hypertension 4. Tobacco abuse  Plan: Patient will return for complete physical exam asap so that we can start working her preventive care for the future. Strongly encourage smoking cessation. Continue Nicoderm CQ. 2 neurology as scheduled. Followup with the vestibular clinic as directed by neurology. He is unable to get an appointment, call our office and we'll make a referral.

## 2013-03-30 ENCOUNTER — Ambulatory Visit: Payer: Medicare PPO | Attending: Internal Medicine

## 2013-03-30 ENCOUNTER — Telehealth: Payer: Self-pay | Admitting: Internal Medicine

## 2013-03-30 DIAGNOSIS — R279 Unspecified lack of coordination: Secondary | ICD-10-CM | POA: Insufficient documentation

## 2013-03-30 DIAGNOSIS — R531 Weakness: Secondary | ICD-10-CM

## 2013-03-30 DIAGNOSIS — Z8673 Personal history of transient ischemic attack (TIA), and cerebral infarction without residual deficits: Secondary | ICD-10-CM

## 2013-03-30 DIAGNOSIS — IMO0001 Reserved for inherently not codable concepts without codable children: Secondary | ICD-10-CM | POA: Insufficient documentation

## 2013-03-30 NOTE — Telephone Encounter (Signed)
PT wife called and stated that [facility] discount medical supply is requesting a RX for a stair lift. She would like this faxed to [phone], attn: Mark. Please assist.

## 2013-03-30 NOTE — Telephone Encounter (Signed)
Rx printed and faxed.  

## 2013-03-30 NOTE — Telephone Encounter (Signed)
Left message to advise pt's wife to call back to let me know why pt needs stair lift

## 2013-03-30 NOTE — Telephone Encounter (Signed)
Please provide note

## 2013-03-30 NOTE — Telephone Encounter (Signed)
We havent seen this man in years.  Dr Lovell Sheehan requested to send this to [facility]

## 2013-04-03 ENCOUNTER — Ambulatory Visit: Payer: Medicare PPO

## 2013-04-06 ENCOUNTER — Telehealth: Payer: Self-pay | Admitting: Internal Medicine

## 2013-04-06 MED ORDER — CLOPIDOGREL BISULFATE 75 MG PO TABS: 75.0000 mg | ORAL_TABLET | Freq: Every day | ORAL | Status: DC

## 2013-04-06 NOTE — Telephone Encounter (Signed)
Pt needs new rx clopidogrel 75 mg #90 with 3 refills sent to [facility]. This med was originally prescribed by Dr Rhona Leavens.

## 2013-04-06 NOTE — Telephone Encounter (Signed)
Rx sent 

## 2013-04-07 ENCOUNTER — Ambulatory Visit: Payer: Medicare PPO | Admitting: Physical Therapy

## 2013-04-09 ENCOUNTER — Ambulatory Visit: Payer: Medicare PPO

## 2013-04-14 ENCOUNTER — Ambulatory Visit: Payer: Medicare PPO | Attending: Internal Medicine | Admitting: Occupational Therapy

## 2013-04-14 DIAGNOSIS — R279 Unspecified lack of coordination: Secondary | ICD-10-CM | POA: Insufficient documentation

## 2013-04-14 DIAGNOSIS — IMO0001 Reserved for inherently not codable concepts without codable children: Secondary | ICD-10-CM | POA: Insufficient documentation

## 2013-04-15 ENCOUNTER — Ambulatory Visit: Payer: Medicare PPO

## 2013-04-17 ENCOUNTER — Ambulatory Visit: Payer: Medicare PPO

## 2013-04-21 ENCOUNTER — Ambulatory Visit: Payer: Medicare PPO

## 2013-04-23 ENCOUNTER — Ambulatory Visit: Payer: Medicare PPO | Admitting: Physical Therapy

## 2013-04-27 ENCOUNTER — Ambulatory Visit: Payer: Medicare PPO | Admitting: Occupational Therapy

## 2013-04-27 ENCOUNTER — Ambulatory Visit: Payer: Medicare PPO

## 2013-04-29 ENCOUNTER — Ambulatory Visit: Payer: Medicare PPO | Admitting: Physical Therapy

## 2013-04-29 ENCOUNTER — Ambulatory Visit: Payer: Medicare PPO | Admitting: Occupational Therapy

## 2013-05-04 ENCOUNTER — Ambulatory Visit: Payer: Medicare PPO

## 2013-05-04 ENCOUNTER — Ambulatory Visit: Payer: Medicare PPO | Admitting: Occupational Therapy

## 2013-05-05 ENCOUNTER — Telehealth: Payer: Self-pay | Admitting: Internal Medicine

## 2013-05-05 NOTE — Telephone Encounter (Signed)
Tried to contact pt , but no one there and no voice mail

## 2013-05-05 NOTE — Telephone Encounter (Signed)
Did I send that to you?  I cant remember

## 2013-05-05 NOTE — Telephone Encounter (Signed)
Pt states [facility] sent [place] a fax for a foot and leg brace. Pt would like to know if received and if you could get that back to them.

## 2013-05-05 NOTE — Telephone Encounter (Signed)
I do not have paperwork for that and

## 2013-05-06 ENCOUNTER — Ambulatory Visit: Payer: Medicare PPO | Admitting: Occupational Therapy

## 2013-05-06 ENCOUNTER — Ambulatory Visit: Payer: Medicare PPO

## 2013-05-06 NOTE — Telephone Encounter (Signed)
Wife called to fu , pls call concerning this order for a brace. Thanks. pls call on cell phone.

## 2013-05-06 NOTE — Telephone Encounter (Signed)
Talked with wife and [facility] will send order

## 2013-05-11 ENCOUNTER — Ambulatory Visit: Payer: Medicare PPO | Admitting: Occupational Therapy

## 2013-05-13 ENCOUNTER — Telehealth: Payer: Self-pay | Admitting: Family

## 2013-05-13 ENCOUNTER — Ambulatory Visit: Payer: Medicare PPO

## 2013-05-13 ENCOUNTER — Ambulatory Visit: Payer: Medicare PPO | Attending: Internal Medicine | Admitting: Occupational Therapy

## 2013-05-13 DIAGNOSIS — IMO0001 Reserved for inherently not codable concepts without codable children: Secondary | ICD-10-CM | POA: Insufficient documentation

## 2013-05-13 DIAGNOSIS — R279 Unspecified lack of coordination: Secondary | ICD-10-CM | POA: Insufficient documentation

## 2013-05-13 NOTE — Telephone Encounter (Signed)
Forms were given to [facility] by [facility]. Forms have been completed and supporting documentation has been faxed to [employer]. Pt's wife is aware

## 2013-05-13 NOTE — Telephone Encounter (Signed)
Pt is still looking for brace.  They need for phy therapy.  [facility] is still waiting for signed order from [facility]. Pt has another PT appt fri morning and therapist needs this brace to work w/ pt.

## 2013-05-13 NOTE — Telephone Encounter (Signed)
I am unaware of anything being sent for pt to receive a brace.  Thomas [name] said that she and Thomas [name] have spoken to [place] about this brace.   Message sent to [facility]

## 2013-05-15 ENCOUNTER — Ambulatory Visit: Payer: Medicare PPO

## 2013-05-18 ENCOUNTER — Ambulatory Visit: Payer: Medicare PPO

## 2013-05-18 ENCOUNTER — Ambulatory Visit: Payer: Medicare PPO | Admitting: Occupational Therapy

## 2013-05-19 ENCOUNTER — Encounter: Payer: Self-pay | Admitting: Nurse Practitioner

## 2013-05-19 ENCOUNTER — Ambulatory Visit (INDEPENDENT_AMBULATORY_CARE_PROVIDER_SITE_OTHER): Payer: 59 | Admitting: Nurse Practitioner

## 2013-05-19 VITALS — BP 100/63 | HR 70 | Temp 97.8°F | Ht 69.75 in | Wt 155.0 lb

## 2013-05-19 DIAGNOSIS — I6350 Cerebral infarction due to unspecified occlusion or stenosis of unspecified cerebral artery: Secondary | ICD-10-CM

## 2013-05-19 NOTE — Progress Notes (Signed)
[facility]  PATIENT: Thomas [name] DOB: 09/18/1940   REASON FOR VISIT: follow up HISTORY FROM: patient  HISTORY OF PRESENT ILLNESS: Thomas [name] is an 72 y.o. male with a history of hypertension, hyperlipidemia and coronary artery disease as well as TIA, presenting 03/19/2013 with new onset weakness involving left lower extremity. Onset was at 7 PM on 03/18/2013. He did not develop weakness of his left upper extremity nor facial weakness. There was no change in speech. Patient has been on aspirin 81 mg per day. CT scan of his head showed no acute intracranial abnormality. MRI showed an acute small right corona radiata ischemic stroke. There also small ischemic strokes involving left frontal and occipital areas. NIH stroke score was 2. Patient was not a TPA candidate secondary to delay in arrival. He was admitted for further evaluation and treatment.  UPDATE 05/19/13 (LL): Patient comes to office for hospital follow up after stroke on 03/18/13.  He has nearly finished all OP therapies; waiting on left AFO with knee brace to arrive to finish last 3 PT visits; 1 more OT visits.  Speech was not affected.  Using a rolling walker while away from the house for stability, uses a cane at home.  Left knee joint is unstable.  Patient tolerating Plavix daily without excessive bruising.  Patient has quit smoking with Nicotine patches.  Blood pressure is well controlled, 100/63 in office. PAtient states he has physical next month at Dr. Blair Dolphin office.  REVIEW OF SYSTEMS: Full 14 system review of systems performed and notable only for:  Neurological: weakness     Sleep: restless legs   ALLERGIES: Allergies  Allergen Reactions  . Sulfonamide Derivatives     REACTION: Weak,lethargic    HOME MEDICATIONS: Outpatient Prescriptions Prior to Visit  Medication Sig Dispense Refill  . atorvastatin (LIPITOR) 40 MG tablet Take 40 mg by mouth daily.      . ciprofloxacin (CIPRO) 500 MG  tablet Take 1 tablet (500 mg total) by mouth 2 (two) times daily.  10 tablet  0  . clopidogrel (PLAVIX) 75 MG tablet Take 1 tablet (75 mg total) by mouth daily with breakfast.  90 tablet  0  . co-enzyme Q-10 50 MG capsule Take 50 mg by mouth daily.        . Flax OIL Take 1 capsule by mouth daily.        . folic acid (FOLVITE) 1 MG tablet Take 1 mg by mouth daily.      . metoprolol succinate (TOPROL-XL) 50 MG 24 hr tablet Take 50 mg by mouth 2 (two) times daily. Take with or immediately following a meal.      . Moexipril-Hydrochlorothiazide 15-12.5 MG TABS Take 1 tablet by mouth daily.      . Omega-3 Fatty Acids (FISH OIL) 1000 MG CAPS Take 1 capsule by mouth daily.         No facility-administered medications prior to visit.    PAST MEDICAL HISTORY: Past Medical History  Diagnosis Date  . Coronary artery disease   . Hyperlipidemia   . Hypertension   . Prostatic hypertrophy, benign     with elevated PSA  . Hx of colonic polyps   . Hyperplastic colon polyp 04/15/2007  . CHF (congestive heart failure) 1999  . TIA (transient ischemic attack) 2003    "mini stroke" (03/19/2013)    PAST SURGICAL HISTORY: Past Surgical History  Procedure Laterality Date  . Colonoscopy  2008  . Coronary artery bypass graft  1999    x4  . Cardiac catheterization    . Cataract extraction w/ intraocular lens  implant, bilateral Bilateral 1998    FAMILY HISTORY: Family History  Problem Relation Age of Onset  . Coronary artery disease Mother   . Stroke Father     SOCIAL HISTORY: History   Social History  . Marital Status: Married    Spouse Name: cathy    Number of Children: 2  . Years of Education: college   Occupational History  . retired    Social History Main Topics  . Smoking status: Former Smoker -- 0.75 packs/day for 50 years    Types: Cigarettes    Quit date: 03/19/2013  . Smokeless tobacco: Never Used  . Alcohol Use: 0.6 oz/week    1 Cans of beer per week     Comment: 03/19/2013 "1  beer/week"  . Drug Use: No  . Sexual Activity: Not Currently   Other Topics Concern  . Not on file   Social History Narrative  . No narrative on file     PHYSICAL EXAM  Filed Vitals:   05/19/13 1451  BP: 100/63  Pulse: 70  Temp: 97.8 F (36.6 C)  TempSrc: Oral  Height: 5' 9.75" (1.772 m)  Weight: 155 lb (70.308 kg)   Body mass index is 22.39 kg/(m^2).  Generalized: Well developed, in no acute distress  Head: normocephalic and atraumatic. Oropharynx benign  Neck: Supple, no carotid bruits  Cardiac: Regular rate rhythm, no murmur  Musculoskeletal: No deformity   Neurological examination   Mentation: Alert oriented to time, place, history taking. Follows all commands speech and language fluent Cranial nerve II-XII: Pupils were equal round reactive to light extraocular movements were full, visual field were full on confrontational test. Facial sensation normal.  Mild left lower face asymmetry.  hearing was intact to finger rubbing bilaterally. Uvula tongue midline. head turning and shoulder shrug and were normal and symmetric.Tongue protrusion into cheek strength was normal. Motor: normal bulk and tone, full strength in the BUE, BLE, no pronator drift.  Mild diminished fine finger movements on left. Orbits right over left upper extremity. Mild left grip weak. Sensory: normal and symmetric to light touch, pinprick, and  vibration  Coordination: finger-nose-finger, heel-to-shin bilaterally, no dysmetria Reflexes: 1+ and symmetric Gait and Station: Rising up from seated position without assistance, wide stance, left hemiplegic gait with mild left foot drop. moderate stride, unable to perform tiptoe, and heel walking without difficulty.   DIAGNOSTIC DATA (LABS, IMAGING, TESTING) - I reviewed patient records, labs, notes, testing and imaging myself where available.  Lab Results  Component Value Date   WBC 11.3* 03/20/2013   HGB 14.6 03/20/2013   HCT 41.7 03/20/2013   MCV 90.1  03/20/2013   PLT 266 03/20/2013      Component Value Date/Time   NA 132* 03/20/2013 0720   K 4.1 03/20/2013 0720   CL 96 03/20/2013 0720   CO2 27 03/20/2013 0720   GLUCOSE 130* 03/20/2013 0720   BUN 7 03/20/2013 0720   CREATININE 0.88 03/20/2013 0720   CALCIUM 9.4 03/20/2013 0720   PROT 7.3 03/19/2013 1111   ALBUMIN 3.8 03/19/2013 1111   AST 24 03/19/2013 1111   ALT 11 03/19/2013 1111   ALKPHOS 97 03/19/2013 1111   BILITOT 0.6 03/19/2013 1111   GFRNONAA 84* 03/20/2013 0720   GFRAA >90 03/20/2013 0720   Lab Results  Component Value Date   CHOL 120 03/20/2013   HDL 44 03/20/2013  LDLCALC 55 03/20/2013   TRIG 104 03/20/2013   CHOLHDL 2.7 03/20/2013   Lab Results  Component Value Date   HGBA1C 5.9* 03/19/2013   No results found for this basename: VITAMINB12   Lab Results  Component Value Date   TSH 0.98 01/21/2007   Mr Lumbar Spine 03/19/2013   No acute osseous abnormality in the lumbar spine. Chronic L4 compression fracture. 2. Lumbar degenerative changes with multifactorial moderate spinal stenosis at L3-L4 and L4-L5. Mild multifactorial spinal stenosis at T11-T12.  Moderate to severe multifactorial left L3 foraminal stenosis, and left greater than right L5 foraminal stenosis - the latter related to degenerative L5-S1 synovial cysts projecting into the neural foramen.   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 Recommend followup by [place] in 3 years.  CT of the brain 03/19/2013 Atrophy and chronic microvascular ischemia. No acute intracranial abnormality.  MRI of the brain 03/19/2013 1. Acute lacunar infarcts in the right corona radiata (favor this is the symptomatic lesion), anterior left frontal lobe cortex, and left occipital lobe subcortical white matter. No associated mass effect or hemorrhage. Favor these represent synchronous small vessel ischemia - see #2. 2. Advanced chronic small vessel ischemia especially affecting the cerebral white matter, deep gray matter nuclei, and brain stem . Mild  left mastoid effusion. Layering secretions in the pharynx. Right maxillary sinus fluid level.  MRA of the brain 03/19/2013 1. Severe intracranial atherosclerosis, mostly effecting large vessels. High-grade stenosis of the nondominant right vertebral artery just proximal to the basilar. Mild to moderate mid basilar artery and bilateral ICA siphon stenosis. 2. Ectatic left cavernous ICA could reflect atherosclerotic pseudo- lesion or a 4 mm cavernous segment aneurysm (rupture of which should result in carotid-cavernous fistula rather than subarachnoid hemorrhage). 3. No major circle of Willis branch occlusion.  Carotid Doppler No evidence of hemodynamically significant internal carotid artery stenosis. Vertebral artery flow is antegrade.   ASSESSMENT AND PLAN Mr. Pacer Dorn is a 72 y.o. male presenting with new onset left lower extremity weakness. Imaging confirms right corona radiata, anterior left frontal lobe cortex, and left occipital lobe subcortical white matter lacunar infarcts. Infarcts felt to be thrombotic secondary to small vessel disease.  Patient with resultant mild left hemiparesis.   Continue clopidogrel 75 mg orally every day  for secondary stroke prevention and maintain strict control of hypertension with blood pressure goal below 130/90, diabetes with hemoglobin A1c goal below 6.5% and lipids with LDL cholesterol goal below 100 mg/dL. Followup in the future with me in 3 months.  Ronal Fear, MSN, NP-C 05/19/2013, 3:00 PM [facility] [address] [place], [place] [address] [phone]

## 2013-05-19 NOTE — Patient Instructions (Signed)
Continue clopidogrel 75 mg orally every day  for secondary stroke prevention and maintain strict control of hypertension with blood pressure goal below 130/90, diabetes with hemoglobin A1c goal below 6.5% and lipids with LDL cholesterol goal below 100 mg/dL.   Followup in the future with me in 3 months.  

## 2013-05-20 ENCOUNTER — Ambulatory Visit: Payer: Medicare PPO | Admitting: Physical Therapy

## 2013-05-20 ENCOUNTER — Ambulatory Visit: Payer: Medicare PPO | Admitting: Occupational Therapy

## 2013-06-01 ENCOUNTER — Ambulatory Visit: Payer: Medicare PPO

## 2013-06-02 ENCOUNTER — Ambulatory Visit: Payer: Medicare PPO

## 2013-06-18 ENCOUNTER — Other Ambulatory Visit (INDEPENDENT_AMBULATORY_CARE_PROVIDER_SITE_OTHER): Payer: Medicare PPO

## 2013-06-18 DIAGNOSIS — Z0000 Encounter for general adult medical examination without abnormal findings: Secondary | ICD-10-CM

## 2013-06-18 DIAGNOSIS — N400 Enlarged prostate without lower urinary tract symptoms: Secondary | ICD-10-CM

## 2013-06-18 DIAGNOSIS — I10 Essential (primary) hypertension: Secondary | ICD-10-CM

## 2013-06-18 LAB — BASIC METABOLIC PANEL
BUN: 13 mg/dL (ref 6–23)
Calcium: 9.4 mg/dL (ref 8.4–10.5)
GFR: 76.33 mL/min (ref >60.00)
Glucose, Bld: 109 mg/dL — ABNORMAL HIGH (ref 70–99)
Potassium: 4.5 mEq/L (ref 3.5–5.1)
Sodium: 136 mEq/L (ref 135–145)

## 2013-06-18 LAB — LIPID PANEL
Cholesterol: 137 mg/dL (ref 0–200)
HDL: 51 mg/dL (ref >39.00)
LDL Cholesterol: 73 mg/dL (ref 0–99)
Triglycerides: 67 mg/dL (ref 0.0–149.0)
VLDL: 13.4 mg/dL (ref 0.0–40.0)

## 2013-06-18 LAB — HEPATIC FUNCTION PANEL
ALT: 17 U/L (ref 0–53)
AST: 26 U/L (ref 0–37)
Alkaline Phosphatase: 90 U/L (ref 39–117)
Total Bilirubin: 0.9 mg/dL (ref 0.3–1.2)

## 2013-06-18 LAB — CBC WITH DIFFERENTIAL/PLATELET
Basophils Absolute: 0 10*3/uL (ref 0.0–0.1)
Eosinophils Relative: 6.6 % — ABNORMAL HIGH (ref 0.0–5.0)
Hemoglobin: 14.1 g/dL (ref 13.0–17.0)
Lymphocytes Relative: 24.2 % (ref 12.0–46.0)
Monocytes Relative: 7.7 % (ref 3.0–12.0)
Platelets: 277 10*3/uL (ref 150.0–400.0)
RDW: 13.5 % (ref 11.5–14.6)
WBC: 7.4 10*3/uL (ref 4.5–10.5)

## 2013-06-19 LAB — POCT URINALYSIS DIPSTICK
Bilirubin, UA: NEGATIVE
Blood, UA: NEGATIVE
Glucose, UA: NEGATIVE
Ketones, UA: NEGATIVE
Spec Grav, UA: 1.015

## 2013-06-24 ENCOUNTER — Other Ambulatory Visit: Payer: Self-pay

## 2013-06-24 MED ORDER — CLOPIDOGREL BISULFATE 75 MG PO TABS: 75.0000 mg | ORAL_TABLET | Freq: Every day | ORAL | Status: DC

## 2013-06-25 ENCOUNTER — Ambulatory Visit (INDEPENDENT_AMBULATORY_CARE_PROVIDER_SITE_OTHER): Payer: Medicare PPO | Admitting: Family

## 2013-06-25 ENCOUNTER — Encounter: Payer: Self-pay | Admitting: Family

## 2013-06-25 VITALS — BP 104/62 | HR 61 | Ht 70.0 in | Wt 158.0 lb

## 2013-06-25 DIAGNOSIS — Z0000 Encounter for general adult medical examination without abnormal findings: Secondary | ICD-10-CM

## 2013-06-25 DIAGNOSIS — I10 Essential (primary) hypertension: Secondary | ICD-10-CM

## 2013-06-25 DIAGNOSIS — Z23 Encounter for immunization: Secondary | ICD-10-CM

## 2013-06-25 DIAGNOSIS — E7800 Pure hypercholesterolemia, unspecified: Secondary | ICD-10-CM

## 2013-06-25 NOTE — Progress Notes (Signed)
Subjective:    Patient ID: Thomas [name], male    DOB: 1940-12-14, 72 y.o.   MRN: [id number]  HPI  72 year old WM,  patient of Dr. Lovell Sheehan presents for yearly preventative medicine examination. All immunizations and health maintenance protocols were reviewed with the patient and they are up to date with these protocols. Screening laboratory values were reviewed with the patient including screening of hyperlipidemia PSA renal function and hepatic function. There medications past medical history social history problem list and allergies were reviewed in detail. Goals were established with regard to weight loss exercise diet in compliance with medications  Review of Systems  Constitutional: Negative.   HENT: Negative.   Eyes: Negative.   Respiratory: Negative.   Cardiovascular: Negative.   Gastrointestinal: Negative.   Endocrine: Negative.   Genitourinary: Negative.   Musculoskeletal: Negative.   Skin: Negative.   Allergic/Immunologic: Negative.   Neurological: Negative.   Hematological: Negative.   Psychiatric/Behavioral: Negative.      Past Medical History  Diagnosis Date  . Coronary artery disease   . Hyperlipidemia   . Hypertension   . Prostatic hypertrophy, benign     with elevated PSA  . Hx of colonic polyps   . Hyperplastic colon polyp 04/15/2007  . CHF (congestive heart failure) 1999  . TIA (transient ischemic attack) 2003    "mini stroke" (03/19/2013)    History   Social History  . Marital Status: Married    Spouse Name: cathy    Number of Children: 2  . Years of Education: college   Occupational History  . retired    Social History Main Topics  . Smoking status: Former Smoker -- 0.75 packs/day for 50 years    Types: Cigarettes    Quit date: 03/19/2013  . Smokeless tobacco: Never Used  . Alcohol Use: 0.6 oz/week    1 Cans of beer per week     Comment: 03/19/2013 "1 beer/week"  . Drug Use: No  . Sexual Activity: Not Currently   Other Topics Concern  .  Not on file   Social History Narrative  . No narrative on file    Past Surgical History  Procedure Laterality Date  . Colonoscopy  2008  . Coronary artery bypass graft  1999    x4  . Cardiac catheterization    . Cataract extraction w/ intraocular lens  implant, bilateral Bilateral 1998    Family History  Problem Relation Age of Onset  . Coronary artery disease Mother   . Stroke Father     Allergies  Allergen Reactions  . Sulfonamide Derivatives     REACTION: Weak,lethargic    Current Outpatient Prescriptions on File Prior to Visit  Medication Sig Dispense Refill  . atorvastatin (LIPITOR) 40 MG tablet Take 40 mg by mouth daily.      . ciprofloxacin (CIPRO) 500 MG tablet Take 1 tablet (500 mg total) by mouth 2 (two) times daily.  10 tablet  0  . clopidogrel (PLAVIX) 75 MG tablet Take 1 tablet (75 mg total) by mouth daily with breakfast.  90 tablet  0  . co-enzyme Q-10 50 MG capsule Take 50 mg by mouth daily.        . Flax OIL Take 1 capsule by mouth daily.        . folic acid (FOLVITE) 1 MG tablet Take 1 mg by mouth daily.      . metoprolol succinate (TOPROL-XL) 50 MG 24 hr tablet Take 50 mg by mouth 2 (two)  times daily. Take with or immediately following a meal.      . Moexipril-Hydrochlorothiazide 15-12.5 MG TABS Take 1 tablet by mouth daily.      . Omega-3 Fatty Acids (FISH OIL) 1000 MG CAPS Take 1 capsule by mouth daily.         No current facility-administered medications on file prior to visit.    BP 104/62  Pulse 61  Ht 5\' 10"  (1.778 m)  Wt 158 lb (71.668 kg)  BMI 22.67 kg/m2chart    Objective:   Physical Exam  Constitutional: He is oriented to person, place, and time. He appears well-developed and well-nourished.  HENT:  Head: Normocephalic and atraumatic.  Right Ear: External ear normal.  Left Ear: External ear normal.  Nose: Nose normal.  Mouth/Throat: Oropharynx is clear and moist.  Eyes: Conjunctivae and EOM are normal. Pupils are equal, round, and  reactive to light.  Neck: Normal range of motion. Neck supple. No thyromegaly present.  Cardiovascular: Normal rate, regular rhythm and normal heart sounds.   Pulmonary/Chest: Effort normal and breath sounds normal.  Abdominal: Soft. Bowel sounds are normal. He exhibits no distension. There is no tenderness. There is no rebound.  Genitourinary: Prostate normal and penis normal. Guaiac negative stool. No penile tenderness.  Musculoskeletal: Normal range of motion.  Neurological: He is alert and oriented to person, place, and time. He has normal reflexes. He displays normal reflexes. No cranial nerve deficit. Coordination normal.  Skin: Skin is warm and dry.  Psychiatric: He has a normal mood and affect.          Assessment & Plan:  Assessment: 1. Complete physical exam 2. Hypertension 3. Hyperglycemia 4. History of CVA  Plan: Continue current medications. Encouraged healthy diet, exercise. We'll followup with patient in 6 months and sooner as needed.

## 2013-06-25 NOTE — Progress Notes (Signed)
Pre-visit discussion using our clinic review tool. No additional management support is needed unless otherwise documented below in the visit note.  

## 2013-06-25 NOTE — Patient Instructions (Signed)
Benign Prostatic Hyperplasia An enlarged prostate (benign prostatic hyperplasia) is common in older men. You may experience the following:  Weak urine stream.  Dribbling.  Feeling like the bladder has not emptied completely.  Difficulty starting urination.  Getting up frequently at night to urinate.  Urinating more frequently during the day. HOME CARE INSTRUCTIONS  Monitor your prostatic hyperplasia for any changes. The following actions may help to alleviate any discomfort you are experiencing:  Give yourself time when you urinate.  Stay away from alcohol.  Avoid beverages containing caffeine, such as coffee, tea, and colas, because they can make the problem worse.  Avoid decongestants, antihistamines, and some prescription medicines that can make the problem worse.  Follow up with your health care provider for further treatment as recommended. SEEK MEDICAL CARE IF:  You are experiencing progressive difficulty voiding.  Your urine stream is progressively getting narrower.  You are awaking from sleep with the urge to void more frequently.  You are constantly feeling the need to void.  You experience loss of urine, especially in small amounts. SEEK IMMEDIATE MEDICAL CARE IF:   You develop increased pain with urination or are unable to urinate.  You develop severe abdominal pain, vomiting, a high fever, or fainting.  You develop back pain or blood in your urine. MAKE SURE YOU:   Understand these instructions.  Will watch your condition.  Will get help right away if you are not doing well or get worse. Document Released: 07/30/2005 Document Revised: 04/01/2013 Document Reviewed: 12/30/2012 [employer] Patient Information 2014 [employer].  

## 2013-07-28 ENCOUNTER — Other Ambulatory Visit: Payer: Self-pay | Admitting: Cardiovascular Disease

## 2013-08-20 ENCOUNTER — Ambulatory Visit (INDEPENDENT_AMBULATORY_CARE_PROVIDER_SITE_OTHER): Payer: 59 | Admitting: Nurse Practitioner

## 2013-08-20 ENCOUNTER — Encounter: Payer: Self-pay | Admitting: Nurse Practitioner

## 2013-08-20 ENCOUNTER — Encounter (INDEPENDENT_AMBULATORY_CARE_PROVIDER_SITE_OTHER): Payer: Self-pay

## 2013-08-20 VITALS — BP 152/83 | HR 56 | Ht 68.5 in | Wt 162.0 lb

## 2013-08-20 DIAGNOSIS — I6350 Cerebral infarction due to unspecified occlusion or stenosis of unspecified cerebral artery: Secondary | ICD-10-CM

## 2013-08-20 MED ORDER — CLOPIDOGREL BISULFATE 75 MG PO TABS: 75.0000 mg | ORAL_TABLET | Freq: Every day | ORAL | Status: DC

## 2013-08-20 NOTE — Progress Notes (Signed)
PATIENT: Thomas Thomas [name] DOB: July 05, 1941   REASON FOR VISIT: follow up for stroke HISTORY FROM: patient  HISTORY OF PRESENT ILLNESS: Thomas Thomas [name] is an 73 y.o. male with a history of hypertension, hyperlipidemia and coronary artery disease as well as TIA, presenting 03/19/2013 with new onset weakness involving left lower extremity. Onset was at 7 PM on 03/18/2013. He did not develop weakness of his left upper extremity nor facial weakness. There was no change in speech. Patient has been on aspirin 81 mg per day. CT scan of his head showed no acute intracranial abnormality. MRI showed an acute small right corona radiata ischemic stroke. There also small ischemic strokes involving left frontal and occipital areas. MRA of the brain showed severe intracranial atherosclerosis, mostly effecting large vessels. High-grade stenosis of the nondominant right vertebral artery just proximal to the basilar. Mild to moderate mid basilar artery and bilateral ICA siphon stenosis. Ectatic left cavernous ICA could reflect atherosclerotic pseudo- lesion or a 4 mm cavernous segment aneurysm (rupture of which should result in carotid-cavernous fistula rather than subarachnoid hemorrhage). No major circle of Willis branch occlusion.  Carotid Doppler study showed no evidence of hemodynamically significant internal carotid artery stenosis.NIH stroke score was 2. Patient was not a TPA candidate secondary to delay in arrival. He was admitted for further evaluation and treatment.   UPDATE 05/19/13 (LL): Patient comes to office for hospital follow up after stroke on 03/18/13. He has nearly finished Thomas [name] OP therapies; waiting on left AFO with knee brace to arrive to finish last 3 PT visits; 1 more OT visits. Speech was not affected. Using a rolling walker while away from the house for stability, uses a cane at home. Left knee joint is unstable. Patient tolerating Plavix daily without excessive bruising. Patient has quit smoking with  Nicotine patches. Blood pressure is well controlled, 100/63 in office. Patient states he has physical next month at Dr. Blair Thomas [name]'s office.   UPDATE 08/20/13 (LL): Thomas Thomas [name] comes in for stroke revisit.  He states he is doing well, has had no new neurological complaints. Still using a rolling walker while away from the house for stability, uses a cane at home. Patient tolerating Plavix daily without excessive bruising. He is still not smoking.  He states blood pressure is well controlled (is slightly elevated today at 152/83) and Thomas [name] blood work at last physical in November was normal.  REVIEW OF SYSTEMS: Full 14 system review of systems performed and notable only for: nothing.  PHYSICAL EXAM  Filed Vitals:   08/20/13 1514  BP: 152/83  Pulse: 56  Height: 5' 8.5" (1.74 m)  Weight: 162 lb (73.483 kg)   Body mass index is 24.27 kg/(m^2).  Generalized: Well developed, in no acute distress  Head: normocephalic and atraumatic. Oropharynx benign  Neck: Supple, no carotid bruits  Cardiac: Regular rate rhythm, no murmur  Musculoskeletal: No deformity   Neurological examination  Mentation: Alert oriented to time, place, history taking. Follows Thomas [name] commands speech and language fluent  Cranial nerve II-XII: Pupils were equal round reactive to light extraocular movements were full, visual field were full on confrontational test. Facial sensation normal. Mild left lower face asymmetry. hearing was intact to finger rubbing bilaterally. Uvula tongue midline. head turning and shoulder shrug and were normal and symmetric.Tongue protrusion into cheek strength was normal.  Motor: normal bulk and tone, full strength in the BUE, BLE, no pronator drift. Mild diminished fine finger movements on left. Orbits right over left upper extremity. Mild left  grip weak.  Sensory: normal and symmetric to light touch, pinprick, and vibration  Coordination: finger-nose-finger, heel-to-shin bilaterally, no dysmetria  Reflexes:  1+ and symmetric  Gait and Station: Rising up from seated position without assistance, wide stance, left hemiplegic gait with mild left foot drop. moderate stride, unable to perform tiptoe, and heel walking without difficulty.   DIAGNOSTIC DATA (LABS, IMAGING, TESTING) - I reviewed patient records, labs, notes, testing and imaging myself where available.  Lab Results  Component Value Date   WBC 7.4 06/18/2013   HGB 14.1 06/18/2013   HCT 41.1 06/18/2013   MCV 92.5 06/18/2013   PLT 277.0 06/18/2013      Component Value Date/Time   NA 136 06/18/2013 0820   K 4.5 06/18/2013 0820   CL 99 06/18/2013 0820   CO2 31 06/18/2013 0820   GLUCOSE 109* 06/18/2013 0820   BUN 13 06/18/2013 0820   CREATININE 1.0 06/18/2013 0820   CALCIUM 9.4 06/18/2013 0820   PROT 6.8 06/18/2013 0820   ALBUMIN 3.8 06/18/2013 0820   AST 26 06/18/2013 0820   ALT 17 06/18/2013 0820   ALKPHOS 90 06/18/2013 0820   BILITOT 0.9 06/18/2013 0820   GFRNONAA 84* 03/20/2013 0720   GFRAA >90 03/20/2013 0720   Lab Results  Component Value Date   CHOL 137 06/18/2013   HDL 51.00 06/18/2013   LDLCALC 73 06/18/2013   TRIG 67.0 06/18/2013   CHOLHDL 3 06/18/2013   Lab Results  Component Value Date   TSH 1.14 06/18/2013    ASSESSMENT AND PLAN Thomas Thomas [name] is a 73 y.o. male presenting with new onset left lower extremity weakness. Imaging confirms right corona radiata, anterior left frontal lobe cortex, and left occipital lobe subcortical white matter lacunar infarcts. Infarcts felt to be thrombotic secondary to small vessel disease. Patient with resultant mild left hemiparesis.   Continue clopidogrel 75 mg orally every day for secondary stroke prevention and maintain strict control of hypertension with blood pressure goal below 130/90, diabetes with hemoglobin A1c goal below 6.5% and lipids with LDL cholesterol goal below 100 mg/dL.  Followup in the future with Dr. Pearlean Brownie in 6 months.  Meds ordered this encounter  Medications  . clopidogrel  (PLAVIX) 75 MG tablet    Sig: Take 1 tablet (75 mg total) by mouth daily with breakfast.    Dispense:  90 tablet    Refill:  3    Order Specific Question:  Supervising Provider    Answer:  Micki Riley [[id number]]   Return in about 6 months (around 02/17/2014).  Ronal Fear, MSN, NP-C 08/20/2013, 4:44 PM [facility] [address], [address] [place], [place] [address] [phone]  Note: This document was prepared with digital dictation and possible smart phrase technology. Any transcriptional errors that result from this process are unintentional.

## 2013-08-20 NOTE — Patient Instructions (Signed)
Continue clopidogrel 75 mg orally every day for secondary stroke prevention and maintain strict control of hypertension with blood pressure goal below 130/90, diabetes with hemoglobin A1c goal below 6.5% and lipids with LDL cholesterol goal below 100 mg/dL.   Followup in the future with Dr. Pearlean Brownie in 6 months.

## 2013-10-22 ENCOUNTER — Other Ambulatory Visit: Payer: Self-pay | Admitting: Cardiovascular Disease

## 2013-10-30 ENCOUNTER — Other Ambulatory Visit: Payer: Self-pay | Admitting: *Deleted

## 2013-10-30 MED ORDER — MOEXIPRIL-HYDROCHLOROTHIAZIDE 15-12.5 MG PO TABS: ORAL_TABLET | ORAL | Status: DC

## 2013-12-18 ENCOUNTER — Encounter: Payer: Self-pay | Admitting: Cardiovascular Disease

## 2014-01-11 ENCOUNTER — Other Ambulatory Visit: Payer: Self-pay | Admitting: *Deleted

## 2014-01-11 MED ORDER — MOEXIPRIL-HYDROCHLOROTHIAZIDE 15-12.5 MG PO TABS: ORAL_TABLET | ORAL | Status: DC

## 2014-01-15 ENCOUNTER — Telehealth: Payer: Self-pay | Admitting: Neurology

## 2014-01-15 NOTE — Telephone Encounter (Signed)
Spoke to patient's wife regarding rescheduling 02/17/14 appointment per Dr. Marlis Edelson schedule, patient's wife verbalized understanding.

## 2014-02-03 ENCOUNTER — Ambulatory Visit: Payer: Medicare PPO | Admitting: Cardiovascular Disease

## 2014-02-15 ENCOUNTER — Encounter: Payer: Self-pay | Admitting: Cardiovascular Disease

## 2014-02-16 ENCOUNTER — Encounter: Payer: Self-pay | Admitting: Cardiovascular Disease

## 2014-02-16 ENCOUNTER — Ambulatory Visit (INDEPENDENT_AMBULATORY_CARE_PROVIDER_SITE_OTHER): Payer: Medicare PPO | Admitting: Cardiovascular Disease

## 2014-02-16 ENCOUNTER — Ambulatory Visit
Admission: RE | Admit: 2014-02-16 | Discharge: 2014-02-16 | Disposition: A | Discharge status: Home / Self Care | Payer: Medicare PPO | Source: Ambulatory Visit | Attending: Cardiovascular Disease | Admitting: Cardiovascular Disease

## 2014-02-16 VITALS — BP 135/77 | HR 57 | Ht 68.5 in | Wt 152.0 lb

## 2014-02-16 DIAGNOSIS — I2510 Atherosclerotic heart disease of native coronary artery without angina pectoris: Secondary | ICD-10-CM

## 2014-02-16 DIAGNOSIS — I10 Essential (primary) hypertension: Secondary | ICD-10-CM

## 2014-02-16 DIAGNOSIS — F17200 Nicotine dependence, unspecified, uncomplicated: Secondary | ICD-10-CM

## 2014-02-16 MED ORDER — MOEXIPRIL-HYDROCHLOROTHIAZIDE 15-12.5 MG PO TABS: ORAL_TABLET | ORAL | Status: DC

## 2014-02-16 NOTE — Assessment & Plan Note (Signed)
Finally quit  No recent CXR  Will order today as he is at higher risk of lung CA for next 5 years

## 2014-02-16 NOTE — Progress Notes (Signed)
Patient ID: Thomas [name], male   DOB: Oct 01, 1940, 73 y.o.   MRN: [id number] Thomas [name] returns today for followup.he has a distant history of coronary bypass surgery his last Myoview in August of 09 was nonischemic with an EF of 58%. He is active and not having any significant chest pain. The the patient's PND or orthopnea no lower extremity edema or exertional dyspnea. He's been compliant with his medications. He is overdue to see Dr Lovell Sheehan His most recent lipid profile is within target. Is not having any side effects from the statin drugs. He does tend to bruise easily on aspirin. He needs a refill on his meds through [facility] for 90 days. Thomas [name] in [place] doing well.   Quit smoking last year finally Wife indicated she threatened to leave him if he didn't quit Dit 3 step process     ROS: Denies fever, malais, weight loss, blurry vision, decreased visual acuity, cough, sputum, SOB, hemoptysis, pleuritic pain, palpitaitons, heartburn, abdominal pain, melena, lower extremity edema, claudication, or rash.  All other systems reviewed and negative  General: Affect appropriate Chronically ill white male  HEENT: normal Neck supple with no adenopathy JVP normal no bruits no thyromegaly Lungs clear with no wheezing and good diaphragmatic motion Heart:  S1/S2 no murmur, no rub, gallop or click PMI normal Abdomen: benighn, BS positve, no tenderness, no AAA no bruit.  No HSM or HJR Distal pulses intact with no bruits No edema Neuro non-focal Skin warm and dry No muscular weakness   Current Outpatient Prescriptions  Medication Sig Dispense Refill  . atorvastatin (LIPITOR) 40 MG tablet TAKE 1 TABLET EVERY DAY  90 tablet  1  . clopidogrel (PLAVIX) 75 MG tablet Take 1 tablet (75 mg total) by mouth daily with breakfast.  90 tablet  3  . co-enzyme Q-10 50 MG capsule Take 50 mg by mouth daily.        . Flax OIL Take 1 capsule by mouth daily.        . folic acid (FOLVITE) 1 MG tablet Take 1 mg by  mouth daily.      . metoprolol succinate (TOPROL-XL) 50 MG 24 hr tablet Take 50 mg by mouth 2 (two) times daily. Take with or immediately following a meal.      . Moexipril-Hydrochlorothiazide 15-12.5 MG TABS TAKE 1 TABLET EVERY DAY (NEED TO MAKE APPOINTMENT TO OBTAIN MORE REFILLS)  30 each  0  . Omega-3 Fatty Acids (FISH OIL) 1000 MG CAPS Take 1 capsule by mouth daily.         No current facility-administered medications for this visit.    Allergies  Sulfonamide derivatives  Electrocardiogram:  SR rate 57 lateral T wave changes   Assessment and Plan

## 2014-02-16 NOTE — Patient Instructions (Signed)
Your physician recommends that you continue on your current medications as directed. Please refer to the Current Medication list given to you today.  A chest x-ray takes a picture of the organs and structures inside the chest, including the heart, lungs, and blood vessels. This test can show several things, including, whether the heart is enlarges; whether fluid is building up in the lungs; and whether pacemaker / defibrillator leads are still in place. ( We scheduled this at [facility] imaging In the [facility]. It is a Walk in Facility. You can go today for Xray)  Your physician wants you to follow-up in: 1 year with Dr Haywood Filler will receive a reminder letter in the mail two months in advance. If you don't receive a letter, please call our office to schedule the follow-up appointment.

## 2014-02-16 NOTE — Assessment & Plan Note (Signed)
Well controlled.  Continue current medications and low sodium Dash type diet.    

## 2014-02-16 NOTE — Assessment & Plan Note (Signed)
Stable with no angina and good activity level.  Continue medical Rx  

## 2014-02-17 ENCOUNTER — Other Ambulatory Visit: Payer: Self-pay

## 2014-02-17 ENCOUNTER — Ambulatory Visit: Payer: 59 | Admitting: Neurology

## 2014-02-17 MED ORDER — FOLIC ACID 1 MG PO TABS: 1.0000 mg | ORAL_TABLET | Freq: Every day | ORAL | Status: DC

## 2014-02-17 MED ORDER — ATORVASTATIN CALCIUM 40 MG PO TABS: ORAL_TABLET | ORAL | Status: DC

## 2014-02-24 ENCOUNTER — Other Ambulatory Visit: Payer: Self-pay

## 2014-02-24 MED ORDER — MOEXIPRIL-HYDROCHLOROTHIAZIDE 15-12.5 MG PO TABS: ORAL_TABLET | ORAL | Status: DC

## 2014-03-04 ENCOUNTER — Encounter: Payer: Self-pay | Admitting: Neurology

## 2014-03-04 ENCOUNTER — Telehealth: Payer: Self-pay | Admitting: Neurology

## 2014-03-04 ENCOUNTER — Ambulatory Visit (INDEPENDENT_AMBULATORY_CARE_PROVIDER_SITE_OTHER): Payer: 59 | Admitting: Neurology

## 2014-03-04 VITALS — BP 134/78 | HR 58 | Ht 68.5 in | Wt 150.0 lb

## 2014-03-04 DIAGNOSIS — I639 Cerebral infarction, unspecified: Secondary | ICD-10-CM

## 2014-03-04 DIAGNOSIS — I6350 Cerebral infarction due to unspecified occlusion or stenosis of unspecified cerebral artery: Secondary | ICD-10-CM

## 2014-03-04 NOTE — Progress Notes (Signed)
PATIENT: Thomas [name] DOB: May 06, 1941   REASON FOR VISIT: follow up for stroke HISTORY FROM: patient  HISTORY OF PRESENT ILLNESS: Thomas [name] is an 73 y.o. male with a history of hypertension, hyperlipidemia and coronary artery disease as well as TIA, presenting 03/19/2013 with new onset weakness involving left lower extremity. Onset was at 7 PM on 03/18/2013. He did not develop weakness of his left upper extremity nor facial weakness. There was no change in speech. Patient has been on aspirin 81 mg per day. CT scan of his head showed no acute intracranial abnormality. MRI showed an acute small right corona radiata ischemic stroke. There also small ischemic strokes involving left frontal and occipital areas. MRA of the brain showed severe intracranial atherosclerosis, mostly effecting large vessels. High-grade stenosis of the nondominant right vertebral artery just proximal to the basilar. Mild to moderate mid basilar artery and bilateral ICA siphon stenosis. Ectatic left cavernous ICA could reflect atherosclerotic pseudo- lesion or a 4 mm cavernous segment aneurysm (rupture of which should result in carotid-cavernous fistula rather than subarachnoid hemorrhage). No major circle of Willis branch occlusion.  Carotid Doppler study showed no evidence of hemodynamically significant internal carotid artery stenosis.NIH stroke score was 2. Patient was not a TPA candidate secondary to delay in arrival. He was admitted for further evaluation and treatment.   UPDATE 05/19/13 (LL): Patient comes to office for hospital follow up after stroke on 03/18/13. He has nearly finished all OP therapies; waiting on left AFO with knee brace to arrive to finish last 3 PT visits; 1 more OT visits. Speech was not affected. Using a rolling walker while away from the house for stability, uses a cane at home. Left knee joint is unstable. Patient tolerating Plavix daily without excessive bruising. Patient has quit smoking with  Nicotine patches. Blood pressure is well controlled, 100/63 in office. Patient states he has physical next month at Dr. Blair Dolphin office.   UPDATE 08/20/13 (LL): [name] comes in for stroke revisit.  He states he is doing well, has had no new neurological complaints. Still using a rolling walker while away from the house for stability, uses a cane at home. Patient tolerating Plavix daily without excessive bruising. He is still not smoking.  He states blood pressure is well controlled (is slightly elevated today at 152/83) and all blood work at last physical in November was normal. Update 03/04/14 : He returns for followup of the last visit 6 months ago. He continues to do well from neurovascular standpoint without recurrent stroke or TIA symptoms. He states his blood pressure is well controlled and it is 134/70 today. His last lipid profile were checked last November and he plans to have it checked again at next visit with primary care physician. He continues to have this some difficulty walking and feels his right knee gives out at times and tracks his right leg. He uses a walker for long distances but can walk short distances without it. He is tolerating Plavix well with only minor bruising. He has no new complaints today. REVIEW OF SYSTEMS: Full 14 system review of systems performed and notable only for: gait difficulty, and knee buckling and also systems negative  PHYSICAL EXAM  Filed Vitals:   03/04/14 1120  BP: 134/78  Pulse: 58  Height: 5' 8.5" (1.74 m)  Weight: 68.04 kg (150 lb)   Body mass index is 22.47 kg/(m^2).  Generalized: Well developed, in no acute distress  Head: normocephalic and atraumatic. Oropharynx benign  Neck: Supple, no carotid bruits  Cardiac: Regular rate rhythm, no murmur  Musculoskeletal: No deformity   Neurological examination  Mentation: Alert oriented to time, place, history taking. Follows all commands speech and language fluent  Cranial nerve II-XII: Pupils  were equal round reactive to light extraocular movements were full, visual field were full on confrontational test. Facial sensation normal. Mild left lower face asymmetry. hearing was intact to finger rubbing bilaterally. Uvula tongue midline. head turning and shoulder shrug and were normal and symmetric.Tongue protrusion into cheek strength was normal.  Motor: normal bulk and tone, full strength in the BUE, BLE, no pronator drift. Mild diminished fine finger movements on left. Orbits right over left upper extremity. Mild left grip weak.  Sensory: normal and symmetric to light touch, pinprick, and vibration  Coordination: finger-nose-finger, heel-to-shin bilaterally, no dysmetria  Reflexes: 1+ and symmetric  Gait and Station: Rising up from seated position without assistance, wide stance, left hemiplegic gait with mild left foot drop. moderate stride, unable to perform tiptoe, and heel walking without difficulty.   DIAGNOSTIC DATA (LABS, IMAGING, TESTING) - I reviewed patient records, labs, notes, testing and imaging myself where available.  Lab Results  Component Value Date   WBC 7.4 06/18/2013   HGB 14.1 06/18/2013   HCT 41.1 06/18/2013   MCV 92.5 06/18/2013   PLT 277.0 06/18/2013      Component Value Date/Time   NA 136 06/18/2013 0820   K 4.5 06/18/2013 0820   CL 99 06/18/2013 0820   CO2 31 06/18/2013 0820   GLUCOSE 109* 06/18/2013 0820   BUN 13 06/18/2013 0820   CREATININE 1.0 06/18/2013 0820   CALCIUM 9.4 06/18/2013 0820   PROT 6.8 06/18/2013 0820   ALBUMIN 3.8 06/18/2013 0820   AST 26 06/18/2013 0820   ALT 17 06/18/2013 0820   ALKPHOS 90 06/18/2013 0820   BILITOT 0.9 06/18/2013 0820   GFRNONAA 84* 03/20/2013 0720   GFRAA >90 03/20/2013 0720   Lab Results  Component Value Date   CHOL 137 06/18/2013   HDL 51.00 06/18/2013   LDLCALC 73 06/18/2013   TRIG 67.0 06/18/2013   CHOLHDL 3 06/18/2013   Lab Results  Component Value Date   TSH 1.14 06/18/2013    ASSESSMENT AND PLAN Thomas [name]  is a 73 y.o. male presenting with new onset left lower extremity weakness. Imaging confirms right corona radiata, anterior left frontal lobe cortex, and left occipital lobe subcortical white matter lacunar infarcts. Infarcts felt to be thrombotic secondary to small vessel disease. Patient with resultant mild left hemiparesis.   Continue clopidogrel 75 mg orally every day for secondary stroke prevention and maintain strict control of hypertension with blood pressure goal below 130/90, diabetes with hemoglobin A1c goal below 6.5% and lipids with LDL cholesterol goal below 100 mg/dL. Check followup carotid ultrasound and transcranial Doppler studies Followup in the future with Heide GuileLynn Lam, NP in 6 months.  No orders of the defined types were placed in this encounter.   No Follow-up on file.  Kathyrn DrownPramod Setrhi, MD  03/04/2014, 12:00 PM [facility] [address], [address] [place], [place] [address] [phone]  Note: This document was prepared with digital dictation and possible smart phrase technology. Any transcriptional errors that result from this process are unintentional.

## 2014-03-04 NOTE — Telephone Encounter (Signed)
Patient's wife returning call to triage, please return call and advise. Patient's wife is unsure of who called her.

## 2014-03-04 NOTE — Patient Instructions (Signed)
Continue clopidogrel 75 mg orally every day  for secondary stroke prevention and maintain strict control of hypertension with blood pressure goal below 130/90, diabetes with hemoglobin A1c goal below 6.5% and lipids with LDL cholesterol goal below 100 mg/dL.check followup carotid ultrasound and transcranial Doppler studies. Followup in the future with Heide Guile, NP in 6 months.

## 2014-03-05 NOTE — Telephone Encounter (Signed)
Called the patient  back  To let her know sue had a appointment and will call her back to make a appointment.

## 2014-03-25 ENCOUNTER — Ambulatory Visit (INDEPENDENT_AMBULATORY_CARE_PROVIDER_SITE_OTHER): Payer: 59

## 2014-03-25 DIAGNOSIS — I639 Cerebral infarction, unspecified: Secondary | ICD-10-CM

## 2014-03-25 DIAGNOSIS — Z0289 Encounter for other administrative examinations: Secondary | ICD-10-CM

## 2014-03-25 DIAGNOSIS — I6350 Cerebral infarction due to unspecified occlusion or stenosis of unspecified cerebral artery: Secondary | ICD-10-CM

## 2014-04-22 ENCOUNTER — Telehealth: Payer: Self-pay | Admitting: *Deleted

## 2014-04-22 NOTE — Telephone Encounter (Signed)
I called pt and relayed the US doppler results to pt (Carotid -  Mild plaques but no significant narrowing, repeat in 12 months, mild hardening of arteries but no major blockages- repeat 12 months). He verbalized understanding.

## 2014-05-25 NOTE — Telephone Encounter (Signed)
error 

## 2014-06-28 ENCOUNTER — Other Ambulatory Visit (INDEPENDENT_AMBULATORY_CARE_PROVIDER_SITE_OTHER): Payer: Medicare PPO

## 2014-06-28 DIAGNOSIS — Z0000 Encounter for general adult medical examination without abnormal findings: Secondary | ICD-10-CM

## 2014-06-28 DIAGNOSIS — E785 Hyperlipidemia, unspecified: Secondary | ICD-10-CM

## 2014-06-28 DIAGNOSIS — N400 Enlarged prostate without lower urinary tract symptoms: Secondary | ICD-10-CM

## 2014-06-28 LAB — HEPATIC FUNCTION PANEL
ALT: 16 U/L (ref 0–53)
AST: 28 U/L (ref 0–37)
Albumin: 4 g/dL (ref 3.5–5.2)
Alkaline Phosphatase: 87 U/L (ref 39–117)
Bilirubin, Direct: 0.2 mg/dL (ref 0.0–0.3)
Total Bilirubin: 1 mg/dL (ref 0.2–1.2)
Total Protein: 6.6 g/dL (ref 6.0–8.3)

## 2014-06-28 LAB — LIPID PANEL
Cholesterol: 131 mg/dL (ref 0–200)
HDL: 37.9 mg/dL — ABNORMAL LOW
LDL Cholesterol: 77 mg/dL (ref 0–99)
NonHDL: 93.1
Total CHOL/HDL Ratio: 3
Triglycerides: 82 mg/dL (ref 0.0–149.0)
VLDL: 16.4 mg/dL (ref 0.0–40.0)

## 2014-06-28 LAB — CBC WITH DIFFERENTIAL/PLATELET
BASOS PCT: 0.3 % (ref 0.0–3.0)
Basophils Absolute: 0 10*3/uL (ref 0.0–0.1)
EOS ABS: 0.3 10*3/uL (ref 0.0–0.7)
EOS PCT: 4.1 % (ref 0.0–5.0)
HEMATOCRIT: 42.7 % (ref 39.0–52.0)
Hemoglobin: 14.4 g/dL (ref 13.0–17.0)
LYMPHS PCT: 22.1 % (ref 12.0–46.0)
Lymphs Abs: 1.7 10*3/uL (ref 0.7–4.0)
MCHC: 33.7 g/dL (ref 30.0–36.0)
MCV: 93.4 fl (ref 78.0–100.0)
Monocytes Absolute: 0.7 10*3/uL (ref 0.1–1.0)
Monocytes Relative: 8.9 % (ref 3.0–12.0)
NEUTROS ABS: 5.1 10*3/uL (ref 1.4–7.7)
Neutrophils Relative %: 64.6 % (ref 43.0–77.0)
Platelets: 302 10*3/uL (ref 150.0–400.0)
RBC: 4.57 Mil/uL (ref 4.22–5.81)
RDW: 13.1 % (ref 11.5–15.5)
WBC: 7.9 10*3/uL (ref 4.0–10.5)

## 2014-06-28 LAB — BASIC METABOLIC PANEL
BUN: 12 mg/dL (ref 6–23)
CO2: 30 mEq/L (ref 19–32)
Calcium: 9.8 mg/dL (ref 8.4–10.5)
Chloride: 96 mEq/L (ref 96–112)
Creatinine, Ser: 1.2 mg/dL (ref 0.4–1.5)
GFR: 63.71 mL/min (ref >60.00)
Glucose, Bld: 113 mg/dL — ABNORMAL HIGH (ref 70–99)
Potassium: 5.3 mEq/L — ABNORMAL HIGH (ref 3.5–5.1)
SODIUM: 132 meq/L — AB (ref 135–145)

## 2014-06-28 LAB — POCT URINALYSIS DIPSTICK
Bilirubin, UA: NEGATIVE
Blood, UA: NEGATIVE
Glucose, UA: NEGATIVE
Ketones, UA: NEGATIVE
Leukocytes, UA: NEGATIVE
Nitrite, UA: NEGATIVE
Protein, UA: NEGATIVE
Spec Grav, UA: 1.015
Urobilinogen, UA: 1
pH, UA: 7

## 2014-06-28 LAB — TSH: TSH: 0.88 u[IU]/mL (ref 0.35–4.50)

## 2014-06-28 LAB — PSA: PSA: 2.04 ng/mL (ref 0.10–4.00)

## 2014-06-30 ENCOUNTER — Encounter: Payer: Self-pay | Admitting: Neurology

## 2014-07-05 ENCOUNTER — Encounter: Payer: Medicare PPO | Admitting: Family

## 2014-07-13 ENCOUNTER — Ambulatory Visit (INDEPENDENT_AMBULATORY_CARE_PROVIDER_SITE_OTHER): Payer: Medicare PPO | Admitting: Family

## 2014-07-13 ENCOUNTER — Encounter: Payer: Self-pay | Admitting: Family

## 2014-07-13 VITALS — BP 108/62 | HR 106 | Ht 69.0 in | Wt 154.8 lb

## 2014-07-13 DIAGNOSIS — E7800 Pure hypercholesterolemia, unspecified: Secondary | ICD-10-CM

## 2014-07-13 DIAGNOSIS — R739 Hyperglycemia, unspecified: Secondary | ICD-10-CM

## 2014-07-13 DIAGNOSIS — Z0000 Encounter for general adult medical examination without abnormal findings: Secondary | ICD-10-CM

## 2014-07-13 DIAGNOSIS — Z23 Encounter for immunization: Secondary | ICD-10-CM

## 2014-07-13 LAB — HEMOGLOBIN A1C: Hgb A1c MFr Bld: 6.1 % (ref 4.6–6.5)

## 2014-07-13 NOTE — Patient Instructions (Signed)

## 2014-07-13 NOTE — Progress Notes (Signed)
Subjective:    Patient ID: Thomas [name], male    DOB: [birth year], 73 y.o.   MRN: [id number]  HPI 73 year old white male, nonsmoker with a history of hypertension, coronary artery disease, hyperglycemia, CVA, hyperlipidemia is in today for Wellness Exam. Last colonoscopy was in 2008 and was normal as due for another colonoscopy in 2018. Influenza vaccine has already been administered. Pneumonia vaccine to be done today. Does not routinely exercise or follow any particular diet. Wife reports that he consumes lots of candies and chocolates.   Review of Systems  Constitutional: Negative.   HENT: Negative.   Eyes: Negative.   Respiratory: Negative.   Cardiovascular: Negative.   Gastrointestinal: Negative.   Endocrine: Negative.   Genitourinary: Negative.   Musculoskeletal: Negative.   Skin: Negative.   Allergic/Immunologic: Negative.   Neurological: Negative.   Hematological: Negative.   Psychiatric/Behavioral: Negative.    Past Medical History  Diagnosis Date  . Coronary artery disease   . Hyperlipidemia   . Hypertension   . Prostatic hypertrophy, benign     with elevated PSA  . Hx of colonic polyps   . Hyperplastic colon polyp 04/15/2007  . CHF (congestive heart failure) 1999  . TIA (transient ischemic attack) 2003    "mini stroke" (03/19/2013)    History   Social History  . Marital Status: Married    Spouse Name: cathy    Number of Children: 2  . Years of Education: college   Occupational History  . retired    Social History Main Topics  . Smoking status: Former Smoker -- 0.75 packs/day for 50 years    Types: Cigarettes    Quit date: 03/19/2013  . Smokeless tobacco: Never Used  . Alcohol Use: 0.6 oz/week    1 Cans of beer per week     Comment: 03/19/2013 "1 beer/week"  . Drug Use: No  . Sexual Activity: Not Currently   Other Topics Concern  . Not on file   Social History Narrative    Past Surgical History  Procedure Laterality Date  . Colonoscopy  2008    . Coronary artery bypass graft  1999    x4  . Cardiac catheterization    . Cataract extraction w/ intraocular lens  implant, bilateral Bilateral 1998    Family History  Problem Relation Age of Onset  . Coronary artery disease Mother   . Stroke Father     Allergies  Allergen Reactions  . Sulfonamide Derivatives     REACTION: Weak,lethargic    Current Outpatient Prescriptions on File Prior to Visit  Medication Sig Dispense Refill  . atorvastatin (LIPITOR) 40 MG tablet TAKE 1 TABLET EVERY DAY 90 tablet 3  . clopidogrel (PLAVIX) 75 MG tablet Take 1 tablet (75 mg total) by mouth daily with breakfast. 90 tablet 3  . co-enzyme Q-10 50 MG capsule Take 50 mg by mouth daily.      . Flax OIL Take 1 capsule by mouth daily.      . folic acid (FOLVITE) 1 MG tablet Take 1 tablet (1 mg total) by mouth daily. 90 tablet 3  . metoprolol succinate (TOPROL-XL) 50 MG 24 hr tablet Take 50 mg by mouth 2 (two) times daily. Take with or immediately following a meal.    . Moexipril-Hydrochlorothiazide 15-12.5 MG TABS TAKE 1 TABLET EVERY DAY 90 each 3  . Omega-3 Fatty Acids (FISH OIL) 1000 MG CAPS Take 1 capsule by mouth daily.       No current facility-administered medications  on file prior to visit.    BP 108/62 mmHg  Pulse 106  Ht 5\' 9"  (1.753 m)  Wt 154 lb 12.8 oz (70.217 kg)  BMI 22.85 kg/m2chart    Objective:   Physical Exam  Constitutional: He is oriented to person, place, and time. He appears well-developed and well-nourished.  HENT:  Head: Normocephalic.  Right Ear: External ear normal.  Left Ear: External ear normal.  Nose: Nose normal.  Mouth/Throat: Oropharynx is clear and moist.  Eyes: Conjunctivae and EOM are normal. Pupils are equal, round, and reactive to light.  Neck: Normal range of motion. Neck supple. No thyromegaly present.  Cardiovascular: Normal rate, regular rhythm and normal heart sounds.   Pulmonary/Chest: Effort normal and breath sounds normal.  Abdominal: Soft.  Bowel sounds are normal.  Genitourinary: Rectum normal, prostate normal and penis normal. Guaiac negative stool.  Musculoskeletal: Normal range of motion.  Neurological: He is alert and oriented to person, place, and time. He has normal reflexes.  Skin: Skin is warm and dry.  Psychiatric: He has a normal mood and affect.          Assessment & Plan:  [name] was seen today for annual exam.  Diagnoses and associated orders for this visit:  Medicare annual wellness visit, subsequent  Hyperglycemia - Hemoglobin A1c  Pure hypercholesterolemia  Need for prophylactic vaccination against Streptococcus pneumoniae (pneumococcus) - Pneumococcal polysaccharide vaccine 23-valent greater than or equal to 2yo subcutaneous/IM    Recheck in 4 months. A1c sent today to further evaluate hyperglycemia. Encouraged healthy diet, exercise.

## 2014-07-20 ENCOUNTER — Other Ambulatory Visit: Payer: Self-pay | Admitting: Cardiovascular Disease

## 2014-09-07 ENCOUNTER — Other Ambulatory Visit: Payer: Self-pay | Admitting: Nurse Practitioner

## 2014-09-07 NOTE — Telephone Encounter (Signed)
Patient has appt scheduled

## 2014-09-14 ENCOUNTER — Ambulatory Visit: Payer: 59 | Admitting: Nurse Practitioner

## 2014-09-27 ENCOUNTER — Ambulatory Visit: Payer: 59 | Admitting: Adult Health

## 2014-09-27 ENCOUNTER — Telehealth: Payer: Self-pay | Admitting: Adult Health

## 2014-09-27 NOTE — Telephone Encounter (Signed)
I called the patient to let him know our office would be closed today due to the weather.  

## 2014-09-28 ENCOUNTER — Telehealth: Payer: Self-pay | Admitting: *Deleted

## 2014-09-28 NOTE — Telephone Encounter (Signed)
Completed - called the patient and rescheduled the appointment   By Geriann Lafont Lahoma Rocker, CMA

## 2014-10-01 ENCOUNTER — Encounter: Payer: Self-pay | Admitting: Adult Health

## 2014-10-01 ENCOUNTER — Ambulatory Visit (INDEPENDENT_AMBULATORY_CARE_PROVIDER_SITE_OTHER): Payer: 59 | Admitting: Adult Health

## 2014-10-01 VITALS — BP 155/78 | HR 57 | Ht 71.0 in | Wt 159.0 lb

## 2014-10-01 DIAGNOSIS — I6930 Unspecified sequelae of cerebral infarction: Secondary | ICD-10-CM

## 2014-10-01 DIAGNOSIS — R269 Unspecified abnormalities of gait and mobility: Secondary | ICD-10-CM

## 2014-10-01 NOTE — Progress Notes (Signed)
PATIENT: Thomas [name]. DOB: 03-16-1941  REASON FOR VISIT: follow up- history of CVA, abnormality of gait HISTORY FROM: patient  HISTORY OF PRESENT ILLNESS: Thomas [name] is a 74 year old male with a history of stroke. He returns today for follow-up. He is currently taking Plavix for stroke prevention. Denies any significant bruising with Plavix. His blood pressure is slightly elevated today.  it is 155/78. Patient has been on a statin for cholesterol control. He reports that his last blood drawn was in November. The patient did have some issues with his balance after the stroke. Patient states that when he stands he has to wait a few seconds before he can take off walking. He uses a walker to ambulate. Denies any falls. No new medical issues since last seen.   HISTORY 03/04/14: Thomas [name] is an 74 y.o. male with a history of hypertension, hyperlipidemia and coronary artery disease as well as TIA, presenting 03/19/2013 with new onset weakness involving left lower extremity. Onset was at 7 PM on 03/18/2013. He did not develop weakness of his left upper extremity nor facial weakness. There was no change in speech. Patient has been on aspirin 81 mg per day. CT scan of his head showed no acute intracranial abnormality. MRI showed an acute small right corona radiata ischemic stroke. There also small ischemic strokes involving left frontal and occipital areas. MRA of the brain showed severe intracranial atherosclerosis, mostly effecting large vessels. High-grade stenosis of the nondominant right vertebral artery just proximal to the basilar. Mild to moderate mid basilar artery and bilateral ICA siphon stenosis. Ectatic left cavernous ICA could reflect atherosclerotic pseudo- lesion or a 4 mm cavernous segment aneurysm (rupture of which should result in carotid-cavernous fistula rather than subarachnoid hemorrhage). No major circle of Willis branch occlusion.   Carotid Doppler study showed no evidence of  hemodynamically significant internal carotid artery stenosis.NIH stroke score was 2. Patient was not a TPA candidate secondary to delay in arrival. He was admitted for further evaluation and treatment.   UPDATE 05/19/13 (LL): Patient comes to office for hospital follow up after stroke on 03/18/13. He has nearly finished all OP therapies; waiting on left AFO with knee brace to arrive to finish last 3 PT visits; 1 more OT visits. Speech was not affected. Using a rolling walker while away from the house for stability, uses a cane at home. Left knee joint is unstable. Patient tolerating Plavix daily without excessive bruising. Patient has quit smoking with Nicotine patches. Blood pressure is well controlled, 100/63 in office. Patient states he has physical next month at Dr. Blair Dolphin office.   UPDATE 08/20/13 (LL): Thomas [name] comes in for stroke revisit.  He states he is doing well, has had no new neurological complaints. Still using a rolling walker while away from the house for stability, uses a cane at home. Patient tolerating Plavix daily without excessive bruising. He is still not smoking.  He states blood pressure is well controlled (is slightly elevated today at 152/83) and all blood work at last physical in November was normal. Update 03/04/14 : He returns for followup of the last visit 6 months ago. He continues to do well from neurovascular standpoint without recurrent stroke or TIA symptoms. He states his blood pressure is well controlled and it is 134/70 today. His last lipid profile were checked last November and he plans to have it checked again at next visit with primary care physician. He continues to have this some difficulty  walking and feels his right knee gives out at times and tracks his right leg. He uses a walker for long distances but can walk short distances without it. He is tolerating Plavix well with only minor bruising. He has no new complaints today.  REVIEW OF SYSTEMS: Out of a  complete 14 system review of symptoms, the patient complains only of the following symptoms, and all other reviewed systems are negative.  cough  ALLERGIES: Allergies  Allergen Reactions  . Sulfonamide Derivatives     REACTION: Weak,lethargic    HOME MEDICATIONS: Outpatient Prescriptions Prior to Visit  Medication Sig Dispense Refill  . atorvastatin (LIPITOR) 40 MG tablet TAKE 1 TABLET EVERY DAY 90 tablet 3  . clopidogrel (PLAVIX) 75 MG tablet TAKE 1 TABLET DAILY WITH BREAKFAST. 90 tablet 0  . co-enzyme Q-10 50 MG capsule Take 50 mg by mouth daily.      . Flax OIL Take 1 capsule by mouth daily.      Marland Kitchen FLUZONE HIGH-DOSE 0.5 ML SUSY     . folic acid (FOLVITE) 1 MG tablet Take 1 tablet (1 mg total) by mouth daily. 90 tablet 3  . metoprolol succinate (TOPROL-XL) 50 MG 24 hr tablet TAKE 1 TABLET TWICE DAILY 180 tablet 1  . Moexipril-Hydrochlorothiazide 15-12.5 MG TABS TAKE 1 TABLET EVERY DAY 90 each 3  . Omega-3 Fatty Acids (FISH OIL) 1000 MG CAPS Take 1 capsule by mouth daily.       No facility-administered medications prior to visit.    PAST MEDICAL HISTORY: Past Medical History  Diagnosis Date  . Coronary artery disease   . Hyperlipidemia   . Hypertension   . Prostatic hypertrophy, benign     with elevated PSA  . Hx of colonic polyps   . Hyperplastic colon polyp 04/15/2007  . CHF (congestive heart failure) 1999  . TIA (transient ischemic attack) 2003    "mini stroke" (03/19/2013)    PAST SURGICAL HISTORY: Past Surgical History  Procedure Laterality Date  . Colonoscopy  2008  . Coronary artery bypass graft  1999    x4  . Cardiac catheterization    . Cataract extraction w/ intraocular lens  implant, bilateral Bilateral 1998    FAMILY HISTORY: Family History  Problem Relation Age of Onset  . Coronary artery disease Mother   . Stroke Father     PHYSICAL EXAM  Filed Vitals:   10/01/14 0829  BP: 155/78  Pulse: 57  Height: 5\' 11"  (1.803 m)  Weight: 159 lb (72.122  kg)   Body mass index is 22.19 kg/(m^2).  Generalized: Well developed, in no acute distress   Neurological examination  Mentation: Alert oriented to time, place, history taking. Follows all commands speech and language fluent Cranial nerve II-XII: Pupils were equal round reactive to light. Extraocular movements were full, visual field were full on confrontational test. Facial sensation and strength were normal. Uvula tongue midline. Head turning and shoulder shrug  were normal and symmetric. Motor: The motor testing reveals 5 over 5 strength of all 4 extremities. Good symmetric motor tone is noted throughout.  Sensory: Sensory testing is intact to soft touch on all 4 extremities. No evidence of extinction is noted.  Coordination: Cerebellar testing reveals good finger-nose-finger and heel-to-shin bilaterally.  Gait and station: Gait is wide based and unsteady without a walker. Tandem gait is not attempted. Romberg is negative. No drift is seen.  Reflexes: Deep tendon reflexes are symmetric and normal bilaterally.     DIAGNOSTIC DATA (LABS,  IMAGING, TESTING) - I reviewed patient records, labs, notes, testing and imaging myself where available.  Lab Results  Component Value Date   WBC 7.9 06/28/2014   HGB 14.4 06/28/2014   HCT 42.7 06/28/2014   MCV 93.4 06/28/2014   PLT 302.0 06/28/2014      Component Value Date/Time   NA 132* 06/28/2014 0852   K 5.3* 06/28/2014 0852   CL 96 06/28/2014 0852   CO2 30 06/28/2014 0852   GLUCOSE 113* 06/28/2014 0852   BUN 12 06/28/2014 0852   CREATININE 1.2 06/28/2014 0852   CALCIUM 9.8 06/28/2014 0852   PROT 6.6 06/28/2014 0852   ALBUMIN 4.0 06/28/2014 0852   AST 28 06/28/2014 0852   ALT 16 06/28/2014 0852   ALKPHOS 87 06/28/2014 0852   BILITOT 1.0 06/28/2014 0852   GFRNONAA 84* 03/20/2013 0720   GFRAA >90 03/20/2013 0720   Lab Results  Component Value Date   CHOL 131 06/28/2014   HDL 37.90* 06/28/2014   LDLCALC 77 06/28/2014   TRIG  82.0 06/28/2014   CHOLHDL 3 06/28/2014   Lab Results  Component Value Date   HGBA1C 6.1 07/13/2014    Lab Results  Component Value Date   TSH 0.88 06/28/2014      ASSESSMENT AND PLAN 74 y.o. year old male  has a past medical history of Coronary artery disease; Hyperlipidemia; Hypertension; Prostatic hypertrophy, benign; colonic polyps; Hyperplastic colon polyp (04/15/2007); CHF (congestive heart failure) (1999); and TIA (transient ischemic attack) (2003). here with:  1. History of CVA 2. Abnormality of gait  Continue clopidogrel 75 mg orally every day for secondary stroke prevention and maintain strict control of hypertension with blood pressure goal below 130/90, diabetes with hemoglobin A1c goal below 6.5% and lipids with LDL cholesterol goal below 100 mg/dL. We will recheck Carotid Dopplers at the next visit. Encouraged the patient to use his walker/cane at all time. If he has any stroke like symptoms he was advised to go to the ED immediately. F/U in 6 months or sooner if needed.   Butch Penny, MSN, NP-C 10/01/2014, 8:31 AM [facility] [address], [address] [place], [place] [address] [phone]  Note: This document was prepared with digital dictation and possible smart phrase technology. Any transcriptional errors that result from this process are unintentional.

## 2014-10-01 NOTE — Patient Instructions (Addendum)
Continue clopidogrel 75 mg orally every day for secondary stroke prevention and maintain strict control of hypertension with blood pressure goal below 130/90, diabetes with hemoglobin A1c goal below 6.5% and lpids with LDL cholesterol goal below 100 mg/dL.  Please use a Cane when ambulating.

## 2014-10-06 NOTE — Progress Notes (Signed)
I have reviewed and agreed above plan. 

## 2014-11-16 ENCOUNTER — Ambulatory Visit (INDEPENDENT_AMBULATORY_CARE_PROVIDER_SITE_OTHER): Payer: Medicare PPO | Admitting: Adult Health

## 2014-11-16 ENCOUNTER — Encounter: Payer: Self-pay | Admitting: Adult Health

## 2014-11-16 VITALS — BP 100/70 | HR 60 | Temp 98.6°F | Wt 162.8 lb

## 2014-11-16 DIAGNOSIS — I10 Essential (primary) hypertension: Secondary | ICD-10-CM | POA: Diagnosis not present

## 2014-11-16 DIAGNOSIS — Z23 Encounter for immunization: Secondary | ICD-10-CM

## 2014-11-16 DIAGNOSIS — K219 Gastro-esophageal reflux disease without esophagitis: Secondary | ICD-10-CM | POA: Diagnosis not present

## 2014-11-16 DIAGNOSIS — Z7689 Persons encountering health services in other specified circumstances: Secondary | ICD-10-CM

## 2014-11-16 NOTE — Progress Notes (Addendum)
   Subjective:    Patient ID: Thomas [name]., male    DOB: November 05, 1940, 74 y.o.   MRN: [id number]  HPI  Patient presents to the office today to establish care after being with Adline Mango. He is a 74 year old male with history of stroke and MI as well as borderline diabetes, HTN and hyperlipidemia, which seem to be well controlled on his current regimen. His only complaint today is that his wife believes that he is belching too much. This has been going on over the last two weeks intermittently and that he has had two instances of feeling as though he is choking, but that this has since resolved. He does not endorse any sour taste in his mouth nor does he have any changes in his diet.   He walks with a walker for the most part when he is out. He has had one fall approx 2-3 weeks ago while getting dressed at home. He denies any injury at this time.   His last eye exam was three years ago.   Review of Systems  See HPI, all other systems negative if not mentioned.   Objective:   Physical Exam  Constitutional: He is oriented to person, place, and time. He appears well-developed and well-nourished. No distress.  HENT:  Head: Normocephalic and atraumatic.  Right Ear: External ear normal.  Left Ear: External ear normal.  Eyes: Pupils are equal, round, and reactive to light.  Cardiovascular: Normal rate, regular rhythm and normal heart sounds.  Exam reveals no gallop and no friction rub.   No murmur heard. Pulmonary/Chest: Effort normal and breath sounds normal. No respiratory distress. He has no wheezes. He has no rales.  Musculoskeletal: Normal range of motion.  Steady gate with walker.   Neurological: He is alert and oriented to person, place, and time.  Skin: Skin is warm and dry. He is not diaphoretic.  Psychiatric: He has a normal mood and affect. His behavior is normal. Judgment and thought content normal.       Assessment & Plan:   GERD Try over the counter Omeprazole or  Nexium first to see if there is any relief with your belching and GERD like symptoms.  - If your symptoms do not improve or you have worsening symptoms follow-up  HTN - Well controlled on current regimen.  - Continue to monitory BP at home and inform me if blood pressure readings start to become high  - Follow up in three months for complete physical   Diabetes -last A1C Lab Results  Component Value Date   HGBA1C 6.1 07/13/2014  - Diabetes will contolled on current regimen  - Continue to work on diet - Cut back on soda - Exercise as much as he is able.  - Follow up in three months for complete physical or sooner if sugars become high.

## 2014-11-16 NOTE — Patient Instructions (Addendum)
Try OTC Omeprazole, Nexium or Prilosec for your belching and possible acid reflux. If this does not improve it please let me know. We will call you soon for a follow up appointment in three months. If you have any questions or concerns before that, please do not hesitate to give me a call.   Continue to exercise and work on diet as this will help with your over all health.

## 2014-11-16 NOTE — Progress Notes (Signed)
Pre visit review using our clinic review tool, if applicable. No additional management support is needed unless otherwise documented below in the visit note. 

## 2014-12-01 ENCOUNTER — Other Ambulatory Visit: Payer: Self-pay | Admitting: Neurology

## 2014-12-28 ENCOUNTER — Other Ambulatory Visit: Payer: Self-pay | Admitting: Cardiovascular Disease

## 2015-02-08 ENCOUNTER — Ambulatory Visit (INDEPENDENT_AMBULATORY_CARE_PROVIDER_SITE_OTHER): Payer: Medicare PPO | Admitting: Adult Health

## 2015-02-08 ENCOUNTER — Encounter: Payer: Self-pay | Admitting: Adult Health

## 2015-02-08 VITALS — BP 124/80 | Temp 97.0°F | Ht 71.0 in | Wt 161.7 lb

## 2015-02-08 DIAGNOSIS — R739 Hyperglycemia, unspecified: Secondary | ICD-10-CM

## 2015-02-08 LAB — BASIC METABOLIC PANEL
BUN: 21 mg/dL (ref 6–23)
CHLORIDE: 99 meq/L (ref 96–112)
CO2: 31 meq/L (ref 19–32)
Calcium: 9.7 mg/dL (ref 8.4–10.5)
Creatinine, Ser: 1.12 mg/dL (ref 0.40–1.50)
GFR: 68.21 mL/min (ref >60.00)
Glucose, Bld: 92 mg/dL (ref 70–99)
POTASSIUM: 4.6 meq/L (ref 3.5–5.1)
Sodium: 135 mEq/L (ref 135–145)

## 2015-02-08 LAB — HEMOGLOBIN A1C: Hgb A1c MFr Bld: 5.7 % (ref 4.6–6.5)

## 2015-02-08 NOTE — Progress Notes (Signed)
Subjective:    Patient ID: Thomas [name]., male    DOB: 1941/03/26, 74 y.o.   MRN: [id number]  HPI Patient presents to the office today for three months follow up. His GERD had improved with Prilosec but he has stopped taking it. Has only had 2 episodes of acid reflex since.    He has been taking Nyquil/Dayquil every other day , because it "makes me feel good". He has no symptom or reasons to be taking the medication  He is not exercising as much as his and his wife would like. They sometimes exercise at church. He is not trying to eat any healthier and continues to drink a lot of soda.    He is "boarderline diabetic" and would like his blood sugars checked.    He has no other complaints at this time.   Review of Systems  Constitutional: Negative.   Respiratory: Negative.   Cardiovascular: Negative.   Gastrointestinal: Negative.   Genitourinary: Negative.   Musculoskeletal: Negative.   Skin: Negative.   Neurological: Negative.   Psychiatric/Behavioral: Negative.   All other systems reviewed and are negative.  Past Medical History  Diagnosis Date  . Coronary artery disease   . Hyperlipidemia   . Hypertension   . Prostatic hypertrophy, benign     with elevated PSA  . Hx of colonic polyps   . Hyperplastic colon polyp 04/15/2007  . CHF (congestive heart failure) 1999  . TIA (transient ischemic attack) 2003    "mini stroke" (03/19/2013)    History   Social History  . Marital Status: Married    Spouse Name: cathy  . Number of Children: 2  . Years of Education: college   Occupational History  . retired    Social History Main Topics  . Smoking status: Former Smoker -- 0.75 packs/day for 50 years    Types: Cigarettes    Quit date: 03/19/2013  . Smokeless tobacco: Never Used  . Alcohol Use: 0.6 oz/week    1 Cans of beer per week     Comment: 03/19/2013 "1 beer/week"  . Drug Use: No  . Sexual Activity: Not Currently   Other Topics Concern  . Not on file    Social History Narrative    Past Surgical History  Procedure Laterality Date  . Colonoscopy  2008  . Coronary artery bypass graft  1999    x4  . Cardiac catheterization    . Cataract extraction w/ intraocular lens  implant, bilateral Bilateral 1998    Family History  Problem Relation Age of Onset  . Coronary artery disease Mother   . Stroke Father     Allergies  Allergen Reactions  . Sulfonamide Derivatives     REACTION: Weak,lethargic    Current Outpatient Prescriptions on File Prior to Visit  Medication Sig Dispense Refill  . atorvastatin (LIPITOR) 40 MG tablet TAKE 1 TABLET EVERY DAY 90 tablet 3  . clopidogrel (PLAVIX) 75 MG tablet TAKE 1 TABLET DAILY WITH BREAKFAST 90 tablet 1  . co-enzyme Q-10 50 MG capsule Take 50 mg by mouth daily.      . Flax OIL Take 1 capsule by mouth daily.      . folic acid (FOLVITE) 1 MG tablet Take 1 tablet (1 mg total) by mouth daily. 90 tablet 3  . metoprolol succinate (TOPROL-XL) 50 MG 24 hr tablet TAKE 1 TABLET TWICE DAILY 180 tablet 0  . Moexipril-Hydrochlorothiazide 15-12.5 MG TABS TAKE 1 TABLET EVERY DAY 90 each 3  .  Omega-3 Fatty Acids (FISH OIL) 1000 MG CAPS Take 1 capsule by mouth daily.       No current facility-administered medications on file prior to visit.    BP 124/80 mmHg  Temp(Src) 97 F (36.1 C) (Oral)  Ht  (1.803 m)  Wt 161 lb 11.2 oz (73.347 kg)  BMI 22.56 kg/m2       Objective:   Physical Exam  Constitutional: He is oriented to person, place, and time. He appears well-developed and well-nourished. No distress.  Cardiovascular: Normal rate, regular rhythm, normal heart sounds and intact distal pulses.  Exam reveals no gallop.   No murmur heard. Pulmonary/Chest: Effort normal and breath sounds normal. No respiratory distress. He has no wheezes. He has no rales. He exhibits no tenderness.  Abdominal: Soft. Bowel sounds are normal. He exhibits no distension and no mass. There is no tenderness. There is no  rebound and no guarding.  Musculoskeletal: Normal range of motion.  Walks with walker  Neurological: He is alert and oriented to person, place, and time.  Skin: Skin is warm and dry. No rash noted. No erythema. No pallor.  Psychiatric: He has a normal mood and affect. His behavior is normal. Judgment and thought content normal.  Nursing note and vitals reviewed.     Assessment & Plan:  1. Borderline hyperglycemia - Hemoglobin A1c - Basic metabolic panel   2. Health Maintenance - Asked to stop taking Nyquil/Dayquil as a way to "feel good"  - He needs to exercise more and eat healthier - Cut back on soda intake.

## 2015-02-08 NOTE — Progress Notes (Signed)
Pre visit review using our clinic review tool, if applicable. No additional management support is needed unless otherwise documented below in the visit note. 

## 2015-02-08 NOTE — Patient Instructions (Addendum)
Continue to take the nexium or prilosec as needed for your acid reflux.   STOP taking nyquil/dayquil and please cut back on your soda intake.   I will follow up with you regarding your labs.

## 2015-02-18 ENCOUNTER — Other Ambulatory Visit: Payer: Self-pay | Admitting: Cardiovascular Disease

## 2015-02-25 NOTE — Progress Notes (Signed)
Patient ID: Thomas Alba., male   DOB: Apr 21, 1941, 74 y.o.   MRN: [id number] Thomas [name] returns today for followup.he has a distant history of coronary bypass surgery his last Myoview in August of 09 was nonischemic with an EF of 58%. He is active and not having any significant chest pain. The the patient's PND or orthopnea no lower extremity edema or exertional dyspnea. He's been compliant with his medications. He is overdue to see Dr Thomas [name] His most recent lipid profile is within target. Is not having any side effects from the statin drugs. He does tend to bruise easily on aspirin. He needs a refill on his meds through [facility] for 90 days. Thomas [name] in [place] doing well.   Quit smoking last year finally Wife indicated she threatened to leave him if he didn't quit Used  3 step process  CXR 02/16/14 reviewed NAD   Gait and balance poor since stroke in August of 2014    ROS: Denies fever, malais, weight loss, blurry vision, decreased visual acuity, cough, sputum, SOB, hemoptysis, pleuritic pain, palpitaitons, heartburn, abdominal pain, melena, lower extremity edema, claudication, or rash.  All other systems reviewed and negative  General: Affect appropriate Chronically ill white male  HEENT: normal Neck supple with no adenopathy JVP normal no bruits no thyromegaly Lungs clear with no wheezing and good diaphragmatic motion Heart:  S1/S2 no murmur, no rub, gallop or click PMI normal Abdomen: benighn, BS positve, no tenderness, no AAA no bruit.  No HSM or HJR Distal pulses intact with no bruits No edema Neuro non-focal Skin warm and dry No muscular weakness Gait bad with walker    Current Outpatient Prescriptions  Medication Sig Dispense Refill  . atorvastatin (LIPITOR) 40 MG tablet Take 40 mg by mouth daily.    . clopidogrel (PLAVIX) 75 MG tablet Take 75 mg by mouth daily.    Marland Kitchen co-enzyme Q-10 50 MG capsule Take 50 mg by mouth daily.      . Flax OIL Take 1 capsule by mouth daily.       . folic acid (FOLVITE) 1 MG tablet Take 1 tablet (1 mg total) by mouth daily. 90 tablet 3  . metoprolol (LOPRESSOR) 50 MG tablet Take 50 mg by mouth 2 (two) times daily.    . Moexipril-Hydrochlorothiazide 15-12.5 MG TABS Take 1 tablet by mouth daily.    . Omega-3 Fatty Acids (FISH OIL) 1000 MG CAPS Take 1 capsule by mouth daily.       No current facility-administered medications for this visit.    Allergies  Sulfonamide derivatives  Electrocardiogram:  02/16/14  SR rate 57 lateral T wave changes   02/28/15  Sr rate 59  LVH no acute changes   Assessment and Plan CAD: Stable with no angina and good activity level.  Continue medical Rx  Chol:  Cholesterol is at goal.  Continue current dose of statin and diet Rx.  No myalgias or side effects.  F/U  LFT's in 6 months. Lab Results  Component Value Date   LDLCALC 77 06/28/2014     HTN: Well controlled.  Continue current medications and low sodium Dash type diet.    Smoker:  Quit now about 2 years    Stroke: on plavix good rehab Still needs walker f/u neuro  F/U with me in a year

## 2015-02-28 ENCOUNTER — Encounter: Payer: Self-pay | Admitting: Cardiovascular Disease

## 2015-02-28 ENCOUNTER — Ambulatory Visit (INDEPENDENT_AMBULATORY_CARE_PROVIDER_SITE_OTHER): Payer: Medicare PPO | Admitting: Cardiovascular Disease

## 2015-02-28 VITALS — BP 130/66 | HR 59 | Ht 71.0 in | Wt 163.0 lb

## 2015-02-28 DIAGNOSIS — I2510 Atherosclerotic heart disease of native coronary artery without angina pectoris: Secondary | ICD-10-CM | POA: Diagnosis not present

## 2015-02-28 DIAGNOSIS — I2583 Coronary atherosclerosis due to lipid rich plaque: Principal | ICD-10-CM

## 2015-02-28 NOTE — Addendum Note (Signed)
Addended by: Scherrie Bateman E on: 02/28/2015 03:17 PM   Modules accepted: Level of Service

## 2015-02-28 NOTE — Patient Instructions (Signed)
Medication Instructions:  NO CHANGES  Labwork: NONE  Testing/Procedures: NONE  Follow-Up: Your physician wants you to follow-up in: YEAR WITH  DR NISHAN You will receive a reminder letter in the mail two months in advance. If you don't receive a letter, please call our office to schedule the follow-up appointment.   Any Other Special Instructions Will Be Listed Below (If Applicable).   

## 2015-04-01 ENCOUNTER — Ambulatory Visit: Payer: 59 | Admitting: Adult Health

## 2015-04-01 ENCOUNTER — Other Ambulatory Visit: Payer: Self-pay | Admitting: Cardiovascular Disease

## 2015-04-05 ENCOUNTER — Ambulatory Visit (INDEPENDENT_AMBULATORY_CARE_PROVIDER_SITE_OTHER): Payer: Medicare PPO | Admitting: Adult Health

## 2015-04-05 ENCOUNTER — Encounter: Payer: Self-pay | Admitting: Adult Health

## 2015-04-05 VITALS — BP 115/70 | HR 68 | Ht 71.0 in | Wt 160.0 lb

## 2015-04-05 DIAGNOSIS — Z8673 Personal history of transient ischemic attack (TIA), and cerebral infarction without residual deficits: Secondary | ICD-10-CM

## 2015-04-05 NOTE — Patient Instructions (Signed)
Continue clopidogrel 75 mg orally every day for secondary stroke prevention and maintain strict control of hypertension with blood pressure goal below 130/90, diabetes with hemoglobin A1c goal below 6.5% and lipids with LDL cholesterol goal below 100 mg/dL 

## 2015-04-05 NOTE — Progress Notes (Signed)
PATIENT: Thomas [name]. DOB: Aug 31, 1940  REASON FOR VISIT: follow up- stroke HISTORY FROM: patient  HISTORY OF PRESENT ILLNESS: Thomas [name] is a 74 year old male with a history of stroke. He returns today for follow-up. The patient continues to take Plavix and tolerates it well. He denies any significant bruising or bleeding. The patient's primary care has been managing his blood pressure and cholesterol. His blood pressure has been under good control. The patient remains on a statin for control his cholesterol. The patient continues to use a walker when ambulating. Denies any recent falls. His most recent hemoglobin A1c was 5.7. He had carotid Dopplers completed in August 2015 that were normal. The patient denies any strokelike symptoms. He returns today for an evaluation.  HISTORY 10/01/14: Thomas [name] is a 74 year old male with a history of stroke. He returns today for follow-up. He is currently taking Plavix for stroke prevention. Denies any significant bruising with Plavix. His blood pressure is slightly elevated today. it is 155/78. Patient has been on a statin for cholesterol control. He reports that his last blood drawn was in November. The patient did have some issues with his balance after the stroke. Patient states that when he stands he has to wait a few seconds before he can take off walking. He uses a walker to ambulate. Denies any falls. No new medical issues since last seen.   HISTORY 03/04/14: Thomas [name] is an 74 y.o. male with a history of hypertension, hyperlipidemia and coronary artery disease as well as TIA, presenting 03/19/2013 with new onset weakness involving left lower extremity. Onset was at 7 PM on 03/18/2013. He did not develop weakness of his left upper extremity nor facial weakness. There was no change in speech. Patient has been on aspirin 81 mg per day. CT scan of his head showed no acute intracranial abnormality. MRI showed an acute small right corona radiata  ischemic stroke. There also small ischemic strokes involving left frontal and occipital areas. MRA of the brain showed severe intracranial atherosclerosis, mostly effecting large vessels. High-grade stenosis of the nondominant right vertebral artery just proximal to the basilar. Mild to moderate mid basilar artery and bilateral ICA siphon stenosis. Ectatic left cavernous ICA could reflect atherosclerotic pseudo- lesion or a 4 mm cavernous segment aneurysm (rupture of which should result in carotid-cavernous fistula rather than subarachnoid hemorrhage). No major circle of Willis branch occlusion.  Carotid Doppler study showed no evidence of hemodynamically significant internal carotid artery stenosis.NIH stroke score was 2. Patient was not a TPA candidate secondary to delay in arrival. He was admitted for further evaluation and treatment.   UPDATE 05/19/13 (LL): Patient comes to office for hospital follow up after stroke on 03/18/13. He has nearly finished all OP therapies; waiting on left AFO with knee brace to arrive to finish last 3 PT visits; 1 more OT visits. Speech was not affected. Using a rolling walker while away from the house for stability, uses a cane at home. Left knee joint is unstable. Patient tolerating Plavix daily without excessive bruising. Patient has quit smoking with Nicotine patches. Blood pressure is well controlled, 100/63 in office. Patient states he has physical next month at Dr. Blair Dolphin office.   UPDATE 08/20/13 (LL): Thomas [name] comes in for stroke revisit. He states he is doing well, has had no new neurological complaints. Still using a rolling walker while away from the house for stability, uses a cane at home. Patient tolerating Plavix daily without excessive bruising.  He is still not smoking. He states blood pressure is well controlled (is slightly elevated today at 152/83) and all blood work at last physical in November was normal. Update 03/04/14 : He returns for followup of  the last visit 6 months ago. He continues to do well from neurovascular standpoint without recurrent stroke or TIA symptoms. He states his blood pressure is well controlled and it is 134/70 today. His last lipid profile were checked last November and he plans to have it checked again at next visit with primary care physician. He continues to have this some difficulty walking and feels his right knee gives out at times and tracks his right leg. He uses a walker for long distances but can walk short distances without it. He is tolerating Plavix well with only minor bruising. He has no new complaints today.  REVIEW OF SYSTEMS: Out of a complete 14 system review of symptoms, the patient complains only of the following symptoms, and all other reviewed systems are negative.  See history of present illness  ALLERGIES: Allergies  Allergen Reactions  . Sulfonamide Derivatives     REACTION: Weak,lethargic    HOME MEDICATIONS: Outpatient Prescriptions Prior to Visit  Medication Sig Dispense Refill  . atorvastatin (LIPITOR) 40 MG tablet Take 40 mg by mouth daily.    . clopidogrel (PLAVIX) 75 MG tablet Take 75 mg by mouth daily.    Marland Kitchen co-enzyme Q-10 50 MG capsule Take 50 mg by mouth daily.      . Flax OIL Take 1 capsule by mouth daily.      . folic acid (FOLVITE) 1 MG tablet TAKE 1 TABLET EVERY DAY 90 tablet 3  . metoprolol (LOPRESSOR) 50 MG tablet Take 50 mg by mouth 2 (two) times daily.    . metoprolol succinate (TOPROL-XL) 50 MG 24 hr tablet TAKE 1 TABLET TWICE DAILY 180 tablet 1  . Moexipril-Hydrochlorothiazide 15-12.5 MG TABS Take 1 tablet by mouth daily.    . Omega-3 Fatty Acids (FISH OIL) 1000 MG CAPS Take 1 capsule by mouth daily.       No facility-administered medications prior to visit.    PAST MEDICAL HISTORY: Past Medical History  Diagnosis Date  . Coronary artery disease   . Hyperlipidemia   . Hypertension   . Prostatic hypertrophy, benign     with elevated PSA  . Hx of colonic  polyps   . Hyperplastic colon polyp 04/15/2007  . CHF (congestive heart failure) 1999  . TIA (transient ischemic attack) 2003    "mini stroke" (03/19/2013)    PAST SURGICAL HISTORY: Past Surgical History  Procedure Laterality Date  . Colonoscopy  2008  . Coronary artery bypass graft  1999    x4  . Cardiac catheterization    . Cataract extraction w/ intraocular lens  implant, bilateral Bilateral 1998    FAMILY HISTORY: Family History  Problem Relation Age of Onset  . Coronary artery disease Mother   . Stroke Father     SOCIAL HISTORY: Social History   Social History  . Marital Status: Married    Spouse Name: cathy  . Number of Children: 2  . Years of Education: college   Occupational History  . retired    Social History Main Topics  . Smoking status: Former Smoker -- 0.75 packs/day for 50 years    Types: Cigarettes    Quit date: 03/19/2013  . Smokeless tobacco: Never Used  . Alcohol Use: 0.6 oz/week    1 Cans of beer  per week     Comment: 03/19/2013 "1 beer/week"  . Drug Use: No  . Sexual Activity: Not Currently   Other Topics Concern  . Not on file   Social History Narrative      PHYSICAL EXAM  Filed Vitals:   04/05/15 1032  BP: 115/70  Pulse: 68  Height:  (1.803 m)  Weight: 160 lb (72.576 kg)   Body mass index is 22.33 kg/(m^2).  Generalized: Well developed, in no acute distress   Neurological examination  Mentation: Alert oriented to time, place, history taking. Follows all commands speech and language fluent Cranial nerve II-XII: Pupils were equal round reactive to light. Extraocular movements were full, visual field were full on confrontational test. Facial sensation and strength were normal. Uvula tongue midline. Head turning and shoulder shrug  were normal and symmetric. Motor: The motor testing reveals 5 over 5 strength in the right upper and lower extremity and 4/5 strength in the left upper or lower extremity. Good symmetric motor tone  is noted throughout.  Sensory: Sensory testing is intact to soft touch on all 4 extremities. No evidence of extinction is noted.  Coordination: Cerebellar testing reveals good finger-nose-finger and heel-to-shin bilaterally.  Gait and station: Patient uses a walker when  ambulating. Romberg is negative  Reflexes: Deep tendon reflexes are symmetric and normal bilaterally.   DIAGNOSTIC DATA (LABS, IMAGING, TESTING) - I reviewed patient records, labs, notes, testing and imaging myself where available.  Lab Results  Component Value Date   WBC 7.9 06/28/2014   HGB 14.4 06/28/2014   HCT 42.7 06/28/2014   MCV 93.4 06/28/2014   PLT 302.0 06/28/2014      Component Value Date/Time   NA 135 02/08/2015 1113   K 4.6 02/08/2015 1113   CL 99 02/08/2015 1113   CO2 31 02/08/2015 1113   GLUCOSE 92 02/08/2015 1113   BUN 21 02/08/2015 1113   CREATININE 1.12 02/08/2015 1113   CALCIUM 9.7 02/08/2015 1113   PROT 6.6 06/28/2014 0852   ALBUMIN 4.0 06/28/2014 0852   AST 28 06/28/2014 0852   ALT 16 06/28/2014 0852   ALKPHOS 87 06/28/2014 0852   BILITOT 1.0 06/28/2014 0852   GFRNONAA 84* 03/20/2013 0720   GFRAA >90 03/20/2013 0720   Lab Results  Component Value Date   CHOL 131 06/28/2014   HDL 37.90* 06/28/2014   LDLCALC 77 06/28/2014   TRIG 82.0 06/28/2014   CHOLHDL 3 06/28/2014   Lab Results  Component Value Date   HGBA1C 5.7 02/08/2015    Lab Results  Component Value Date   TSH 0.88 06/28/2014      ASSESSMENT AND PLAN 74 y.o. year old male  has a past medical history of Coronary artery disease; Hyperlipidemia; Hypertension; Prostatic hypertrophy, benign; colonic polyps; Hyperplastic colon polyp (04/15/2007); CHF (congestive heart failure) (1999); and TIA (transient ischemic attack) (2003). here with:  1. History of stroke  Continue clopidogrel 75 mg orally every day for secondary stroke prevention and maintain strict control of hypertension with blood pressure goal below 130/90,  diabetes with hemoglobin A1c goal below 6.5% and lipids with LDL cholesterol goal below 100 mg/dL.  Encouraged the patient to use his walker/cane at all time. If he has any stroke like symptoms he was advised to go to the ED immediately. The patient has remained stable and for that reason he will continue follow-up with his primary care provider. He can follow up with our office on an as-needed basis.    Aundra Millet  Ethelene Browns, MSN, NP-C 04/05/2015, 10:32 AM [facility] [address] [place], [place] [address] [phone]

## 2015-04-05 NOTE — Progress Notes (Signed)
I have read the note, and I agree with the clinical assessment and plan.  WILLIS,CHARLES KEITH   

## 2015-05-18 ENCOUNTER — Other Ambulatory Visit: Payer: Self-pay | Admitting: Neurology

## 2015-07-25 ENCOUNTER — Other Ambulatory Visit: Payer: Self-pay | Admitting: Cardiovascular Disease

## 2015-09-20 ENCOUNTER — Other Ambulatory Visit: Payer: Self-pay | Admitting: Cardiovascular Disease

## 2015-09-20 MED ORDER — METOPROLOL TARTRATE 50 MG PO TABS: 50.0000 mg | ORAL_TABLET | Freq: Two times a day (BID) | ORAL | Status: DC

## 2015-11-07 ENCOUNTER — Other Ambulatory Visit: Payer: Self-pay | Admitting: Cardiovascular Disease

## 2015-11-11 ENCOUNTER — Other Ambulatory Visit: Payer: Self-pay | Admitting: *Deleted

## 2015-11-11 MED ORDER — MOEXIPRIL-HYDROCHLOROTHIAZIDE 15-12.5 MG PO TABS: 1.0000 | ORAL_TABLET | Freq: Every day | ORAL | Status: DC

## 2016-01-24 ENCOUNTER — Other Ambulatory Visit: Payer: Self-pay | Admitting: *Deleted

## 2016-01-24 ENCOUNTER — Other Ambulatory Visit: Payer: Self-pay | Admitting: Cardiovascular Disease

## 2016-01-24 MED ORDER — ATORVASTATIN CALCIUM 40 MG PO TABS: 40.0000 mg | ORAL_TABLET | Freq: Every day | ORAL | Status: DC

## 2016-01-24 MED ORDER — MOEXIPRIL-HYDROCHLOROTHIAZIDE 15-12.5 MG PO TABS: 1.0000 | ORAL_TABLET | Freq: Every day | ORAL | Status: DC

## 2016-01-24 NOTE — Telephone Encounter (Signed)
Ok to refill 

## 2016-03-14 NOTE — Progress Notes (Signed)
Patient ID: Thomas Alba., male   DOB: [birth year], 75 y.o.   MRN: [id number] Thomas [name] returns today for followup.he has a distant history of coronary bypass surgery his last Myoview in August of 09 was nonischemic with an EF of 58%. He is active and not having any significant chest pain. The the patient's PND or orthopnea no lower extremity edema or exertional dyspnea. He's been compliant with his medications. He is overdue to see Thomas [name] His most recent lipid profile is within target. Is not having any side effects from the statin drugs. He does tend to bruise easily on aspirin. He needs a refill on his meds through Thomas [name] for 90 days. Thomas [name] in [place] doing well.   Quit smoking  Wife indicated she threatened to leave him if he didn't quit Used  3 step process  CXR 02/16/14 reviewed NAD   Gait and balance poor since stroke in August of 2014    ROS: Denies fever, malais, weight loss, blurry vision, decreased visual acuity, cough, sputum, SOB, hemoptysis, pleuritic pain, palpitaitons, heartburn, abdominal pain, melena, lower extremity edema, claudication, or rash.  All other systems reviewed and negative  General: Affect appropriate Chronically ill white male  HEENT: normal Neck supple with no adenopathy JVP normal no bruits no thyromegaly Lungs clear with no wheezing and good diaphragmatic motion Heart:  S1/S2 no murmur, no rub, gallop or click PMI normal Abdomen: benighn, BS positve, no tenderness, no AAA no bruit.  No HSM or HJR Distal pulses intact with no bruits No edema Neuro chronic LUE / LLE weakness from stroke  Skin warm and dry Mild LLE weakness from stroke  Gait bad with walker    Current Outpatient Prescriptions  Medication Sig Dispense Refill  . atorvastatin (LIPITOR) 40 MG tablet Take 1 tablet (40 mg total) by mouth daily. 90 tablet 0  . clopidogrel (PLAVIX) 75 MG tablet TAKE 1 TABLET DAILY WITH BREAKFAST 90 tablet 4  . co-enzyme Q-10 50 MG capsule Take 50  mg by mouth daily.      . Flax OIL Take 1 capsule by mouth daily.      . folic acid (FOLVITE) 1 MG tablet Take 1 tablet every day 90 tablet 3  . metoprolol (LOPRESSOR) 50 MG tablet Take 1 tablet (50 mg total) by mouth 2 (two) times daily. 180 tablet 2  . Moexipril-Hydrochlorothiazide 15-12.5 MG TABS Take 1 tablet by mouth daily. 90 each 0  . Omega-3 Fatty Acids (FISH OIL) 1000 MG CAPS Take 1 capsule by mouth daily.       No current facility-administered medications for this visit.     Allergies  Sulfonamide derivatives  Electrocardiogram:  02/16/14  SR rate 57 lateral T wave changes   02/28/15  Sr rate 59  LVH no acute changes  03/16/16  SR rate 56 limb voltage LVH nonspecific lateral  ST changes   Assessment and Plan CAD: Stable with no angina and good activity level.  Continue medical Rx  Chol:  Cholesterol is at goal.  Continue current dose of statin and diet Rx.  No myalgias or side effects.  F/U  LFT's in 6 months. Lab Results  Component Value Date   LDLCALC 77 06/28/2014     HTN: Well controlled.  Continue current medications and low sodium Dash type diet.    Smoker:  Quit now about 3 years    Stroke: on plavix good rehab Still needs walker f/u neuro 2014 small right corona radiata ischemic stroke  With LUE weakness    F/U with me in a year   Thomas [name]

## 2016-03-16 ENCOUNTER — Encounter: Payer: Self-pay | Admitting: Cardiovascular Disease

## 2016-03-16 ENCOUNTER — Ambulatory Visit (INDEPENDENT_AMBULATORY_CARE_PROVIDER_SITE_OTHER): Payer: Medicare Other | Admitting: Cardiovascular Disease

## 2016-03-16 VITALS — BP 120/70 | HR 57 | Resp 16 | Ht 71.0 in | Wt 166.2 lb

## 2016-03-16 DIAGNOSIS — I2583 Coronary atherosclerosis due to lipid rich plaque: Principal | ICD-10-CM

## 2016-03-16 DIAGNOSIS — I2510 Atherosclerotic heart disease of native coronary artery without angina pectoris: Secondary | ICD-10-CM

## 2016-03-16 MED ORDER — ATORVASTATIN CALCIUM 40 MG PO TABS: 40.0000 mg | ORAL_TABLET | Freq: Every day | ORAL | 3 refills | Status: DC

## 2016-03-16 MED ORDER — MOEXIPRIL-HYDROCHLOROTHIAZIDE 15-12.5 MG PO TABS: 1.0000 | ORAL_TABLET | Freq: Every day | ORAL | 3 refills | Status: DC

## 2016-03-16 NOTE — Patient Instructions (Signed)

## 2016-05-15 ENCOUNTER — Other Ambulatory Visit: Payer: Self-pay | Admitting: Cardiovascular Disease

## 2016-08-03 ENCOUNTER — Telehealth: Payer: Self-pay | Admitting: Cardiovascular Disease

## 2016-08-03 MED ORDER — CLOPIDOGREL BISULFATE 75 MG PO TABS: 75.0000 mg | ORAL_TABLET | Freq: Every day | ORAL | 3 refills | Status: DC

## 2016-08-03 NOTE — Telephone Encounter (Signed)
Refilled plavix for Dr. Fabio Bering patient. Patient was to continue all her current medications at last office visit on 03/16/16, which included Plavix.

## 2016-08-03 NOTE — Telephone Encounter (Signed)
New message  Pt's wife is calling in reference to Rx,clopidogrel 75mg  1 tab w/breakfast  w/Dr. Pearlean Brownie, released in July from that Dr.  Laury Axon Dr. Eden Emms to continue Rx   Please call back

## 2016-09-26 ENCOUNTER — Encounter: Payer: Medicare Other | Admitting: Adult Health

## 2016-09-27 NOTE — Progress Notes (Signed)
Patient ID: Thomas Alba., male   DOB: 24-Sep-1940, 76 y.o.   MRN: [id number]   Thomas [name] returns today for followup.he has a distant history of coronary bypass surgery by Dr Tyrone Sage 1999  Last Myoview in August of 09 was nonischemic with an EF of 58%. He is active and not having any significant chest pain. The the patient's PND or orthopnea no lower extremity edema or exertional dyspnea. He's been compliant with his medications.  His most recent lipid profile is within target. Is not having any side effects from the statin drugs. He does tend to bruise easily on aspirin. He needs a refill on his meds through [facility] for 90 days. [name] in [place] doing well.   Quit smoking 2015   Wife indicated she threatened to leave him if he didn't quit Used  3 step process  CXR 02/16/14 reviewed NAD   Gait and balance poor since stroke in August of 2014    Dyspnea when dressing and left leg/foot freezes at times   ROS: Denies fever, malais, weight loss, blurry vision, decreased visual acuity, cough, sputum, SOB, hemoptysis, pleuritic pain, palpitaitons, heartburn, abdominal pain, melena, lower extremity edema, claudication, or rash.  All other systems reviewed and negative  General: Affect appropriate Chronically ill white male  HEENT: normal Neck supple with no adenopathy JVP normal no bruits no thyromegaly Lungs clear with no wheezing and good diaphragmatic motion Heart:  S1/S2 no murmur, no rub, gallop or click PMI normal Abdomen: benighn, BS positve, no tenderness, no AAA no bruit.  No HSM or HJR Distal pulses intact with no bruits No edema Neuro chronic LUE / LLE weakness from stroke  Skin warm and dry Mild LLE weakness from stroke  Gait bad with walker    Current Outpatient Prescriptions  Medication Sig Dispense Refill  . atorvastatin (LIPITOR) 40 MG tablet Take 1 tablet (40 mg total) by mouth daily. 90 tablet 3  . cetirizine (ZYRTEC) 10 MG tablet Take 10 mg by mouth daily.    .  clopidogrel (PLAVIX) 75 MG tablet Take 1 tablet (75 mg total) by mouth daily with breakfast. 90 tablet 3  . co-enzyme Q-10 50 MG capsule Take 50 mg by mouth daily.      . Flax OIL Take 1 capsule by mouth daily.      . folic acid (FOLVITE) 1 MG tablet Take 1 tablet every day 90 tablet 3  . loratadine (CLARITIN) 10 MG tablet Take 10 mg by mouth daily.    . metoprolol (LOPRESSOR) 50 MG tablet TAKE 1 TABLET BY MOUTH TWO  TIMES DAILY 180 tablet 2  . Moexipril-Hydrochlorothiazide 15-12.5 MG TABS Take 1 tablet by mouth daily. 90 each 3  . Omega-3 Fatty Acids (FISH OIL) 1000 MG CAPS Take 1 capsule by mouth daily.       No current facility-administered medications for this visit.     Allergies  Sulfonamide derivatives  Electrocardiogram:  02/16/14  SR rate 57 lateral T wave changes   02/28/15  Sr rate 59  LVH no acute changes  03/16/16  SR rate 56 limb voltage LVH nonspecific lateral  ST changes   Assessment and Plan CAD:  CABG 1999 Non ischemic myovue 2009 Stable with no angina and good activity level.  Continue medical Rx  Chol:  Cholesterol is at goal.  Continue current dose of statin and diet Rx.  No myalgias or side effects.  F/U  LFT's in 6 months. Lab Results  Component Value Date  LDLCALC 77 06/28/2014     HTN: Well controlled.  Continue current medications and low sodium Dash type diet.    Smoker:  Quit now about 5 years  No wheezing   Stroke: on plavix good rehab Still needs walker f/u neuro 2014 small right corona radiata ischemic stroke With LUE weakness   Insomnia:  He is taking melatonin, claritin , and zyrtec discussed not doing all 3 and just taking melatonin  Dyspnea:  Seems functional mostly when dressing f/u echo to make sure LV/RV function normal    F/U with me in a year   [employer]

## 2016-10-05 ENCOUNTER — Encounter (INDEPENDENT_AMBULATORY_CARE_PROVIDER_SITE_OTHER): Payer: Self-pay

## 2016-10-05 ENCOUNTER — Ambulatory Visit (INDEPENDENT_AMBULATORY_CARE_PROVIDER_SITE_OTHER): Payer: Medicare Other | Admitting: Cardiovascular Disease

## 2016-10-05 ENCOUNTER — Encounter: Payer: Self-pay | Admitting: Cardiovascular Disease

## 2016-10-05 VITALS — BP 130/70 | HR 61 | Ht 71.0 in | Wt 175.1 lb

## 2016-10-05 DIAGNOSIS — I2583 Coronary atherosclerosis due to lipid rich plaque: Secondary | ICD-10-CM | POA: Diagnosis not present

## 2016-10-05 DIAGNOSIS — I2510 Atherosclerotic heart disease of native coronary artery without angina pectoris: Secondary | ICD-10-CM | POA: Diagnosis not present

## 2016-10-05 DIAGNOSIS — R0602 Shortness of breath: Secondary | ICD-10-CM

## 2016-10-05 NOTE — Patient Instructions (Addendum)

## 2016-10-16 ENCOUNTER — Ambulatory Visit (HOSPITAL_COMMUNITY): Payer: Medicare Other | Attending: Cardiology

## 2016-10-16 ENCOUNTER — Other Ambulatory Visit: Payer: Self-pay

## 2016-10-16 DIAGNOSIS — I501 Left ventricular failure: Secondary | ICD-10-CM | POA: Insufficient documentation

## 2016-10-16 DIAGNOSIS — R0602 Shortness of breath: Secondary | ICD-10-CM

## 2016-10-16 DIAGNOSIS — I352 Nonrheumatic aortic (valve) stenosis with insufficiency: Secondary | ICD-10-CM | POA: Insufficient documentation

## 2016-10-16 DIAGNOSIS — I348 Other nonrheumatic mitral valve disorders: Secondary | ICD-10-CM | POA: Insufficient documentation

## 2016-10-26 ENCOUNTER — Telehealth: Payer: Self-pay | Admitting: Family Medicine

## 2016-11-01 ENCOUNTER — Encounter: Payer: Medicare Other | Admitting: Adult Health

## 2016-11-15 ENCOUNTER — Encounter: Payer: Self-pay | Admitting: Adult Health

## 2016-11-15 ENCOUNTER — Ambulatory Visit (INDEPENDENT_AMBULATORY_CARE_PROVIDER_SITE_OTHER): Payer: Medicare Other | Admitting: Adult Health

## 2016-11-15 VITALS — BP 132/82 | Ht 71.0 in | Wt 173.2 lb

## 2016-11-15 DIAGNOSIS — Z0000 Encounter for general adult medical examination without abnormal findings: Secondary | ICD-10-CM | POA: Diagnosis not present

## 2016-11-15 DIAGNOSIS — I2510 Atherosclerotic heart disease of native coronary artery without angina pectoris: Secondary | ICD-10-CM | POA: Diagnosis not present

## 2016-11-15 DIAGNOSIS — R739 Hyperglycemia, unspecified: Secondary | ICD-10-CM | POA: Diagnosis not present

## 2016-11-15 DIAGNOSIS — I10 Essential (primary) hypertension: Secondary | ICD-10-CM

## 2016-11-15 LAB — BASIC METABOLIC PANEL
BUN: 19 mg/dL (ref 6–23)
CHLORIDE: 99 meq/L (ref 96–112)
CO2: 31 meq/L (ref 19–32)
Calcium: 9.2 mg/dL (ref 8.4–10.5)
Creatinine, Ser: 1.31 mg/dL (ref 0.40–1.50)
GFR: 56.65 mL/min — ABNORMAL LOW (ref >60.00)
GLUCOSE: 111 mg/dL — AB (ref 70–99)
POTASSIUM: 4.2 meq/L (ref 3.5–5.1)
SODIUM: 136 meq/L (ref 135–145)

## 2016-11-15 LAB — CBC WITH DIFFERENTIAL/PLATELET
Basophils Absolute: 0 10*3/uL (ref 0.0–0.1)
Basophils Relative: 0.6 % (ref 0.0–3.0)
EOS PCT: 4.8 % (ref 0.0–5.0)
Eosinophils Absolute: 0.4 10*3/uL (ref 0.0–0.7)
HCT: 42.1 % (ref 39.0–52.0)
Hemoglobin: 14.2 g/dL (ref 13.0–17.0)
LYMPHS ABS: 2 10*3/uL (ref 0.7–4.0)
Lymphocytes Relative: 24.9 % (ref 12.0–46.0)
MCHC: 33.8 g/dL (ref 30.0–36.0)
MCV: 92 fl (ref 78.0–100.0)
MONOS PCT: 7.8 % (ref 3.0–12.0)
Monocytes Absolute: 0.6 10*3/uL (ref 0.1–1.0)
NEUTROS ABS: 4.9 10*3/uL (ref 1.4–7.7)
NEUTROS PCT: 61.9 % (ref 43.0–77.0)
Platelets: 275 10*3/uL (ref 150.0–400.0)
RBC: 4.58 Mil/uL (ref 4.22–5.81)
RDW: 13.4 % (ref 11.5–15.5)
WBC: 7.9 10*3/uL (ref 4.0–10.5)

## 2016-11-15 LAB — HEPATIC FUNCTION PANEL
ALBUMIN: 3.9 g/dL (ref 3.5–5.2)
ALK PHOS: 107 U/L (ref 39–117)
ALT: 11 U/L (ref 0–53)
AST: 20 U/L (ref 0–37)
Bilirubin, Direct: 0.1 mg/dL (ref 0.0–0.3)
TOTAL PROTEIN: 6.7 g/dL (ref 6.0–8.3)
Total Bilirubin: 0.6 mg/dL (ref 0.2–1.2)

## 2016-11-15 LAB — LIPID PANEL
Cholesterol: 147 mg/dL (ref 0–200)
HDL: 39 mg/dL — AB (ref >39.00)
LDL Cholesterol: 86 mg/dL (ref 0–99)
NONHDL: 108.33
Total CHOL/HDL Ratio: 4
Triglycerides: 112 mg/dL (ref 0.0–149.0)
VLDL: 22.4 mg/dL (ref 0.0–40.0)

## 2016-11-15 LAB — TSH: TSH: 2.29 u[IU]/mL (ref 0.35–4.50)

## 2016-11-15 LAB — PSA: PSA: 2.75 ng/mL (ref 0.10–4.00)

## 2016-11-15 LAB — HEMOGLOBIN A1C: Hgb A1c MFr Bld: 6.3 % (ref 4.6–6.5)

## 2016-11-15 NOTE — Progress Notes (Signed)
Subjective:    Patient ID: Thomas Alba., male    DOB: 04-11-1941, 76 y.o.   MRN: [id number]  HPI Patient presents for yearly preventative medicine examination. He is a pleasant 76 year old male who  has a past medical history of CHF (congestive heart failure) (HCC) (1999); Coronary artery disease; colonic polyps; Hyperlipidemia; Hyperplastic colon polyp (04/15/2007); Hypertension; Prostatic hypertrophy, benign; and TIA (transient ischemic attack) (2003).   All immunizations and health maintenance protocols were reviewed with the patient and needed orders were placed. He is up to date on his vaccinations   Appropriate screening laboratory values were ordered for the patient including screening of hyperlipidemia, renal function and hepatic function. If indicated by BPH, a PSA was ordered.  Medication reconciliation,  past medical history, social history, problem list and allergies were reviewed in detail with the patient  Goals were established with regard to weight loss, exercise, and  diet in compliance with medications  End of life planning was discussed. He has an advanced directive and living will.   He has been seen by Cardiology in February . He will be doing yearly follow up exams.   He is due for a colonoscopy this year. He would like to wait on this. He understands his risks.   His wife, who is at this visit reports that he has had a fall, without injury at the beginning of last month. He denies syncopal episode. He sometimes feels off balance. His wife has been urging him to use his walker more often.    Review of Systems  Constitutional: Negative.   HENT: Negative.   Eyes: Negative.   Respiratory: Negative.   Cardiovascular: Negative.   Endocrine: Negative.   Genitourinary: Negative.   Musculoskeletal: Negative.   Skin: Negative.   Allergic/Immunologic: Negative.   Neurological: Negative.   Hematological: Negative.   All other systems reviewed and are  negative.  Past Medical History:  Diagnosis Date  . CHF (congestive heart failure) (HCC) 1999  . Coronary artery disease   . Hx of colonic polyps   . Hyperlipidemia   . Hyperplastic colon polyp 04/15/2007  . Hypertension   . Prostatic hypertrophy, benign    with elevated PSA  . TIA (transient ischemic attack) 2003   "mini stroke" (03/19/2013)    Social History   Social History  . Marital status: Married    Spouse name: cathy  . Number of children: 2  . Years of education: college   Occupational History  . retired    Social History Main Topics  . Smoking status: Former Smoker    Packs/day: 0.75    Years: 50.00    Types: Cigarettes    Quit date: 03/19/2013  . Smokeless tobacco: Never Used  . Alcohol use 0.6 oz/week    1 Cans of beer per week     Comment: 03/19/2013 "1 beer/week"   . Drug use: No  . Sexual activity: Not Currently   Other Topics Concern  . Not on file   Social History Narrative   Patient lives at home with wife [name])   Retired.   Education one year of college.   Right handed.   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     Past Surgical History:  Procedure Laterality Date  . CARDIAC CATHETERIZATION    . CATARACT EXTRACTION W/ INTRAOCULAR LENS  IMPLANT, BILATERAL Bilateral 1998  . COLONOSCOPY  2008  . CORONARY ARTERY BYPASS GRAFT  1999   x4  Family History  Problem Relation Age of Onset  . Coronary artery disease Mother   . Stroke Father     Allergies  Allergen Reactions  . Sulfonamide Derivatives     REACTION: Weak,lethargic    Current Outpatient Prescriptions on File Prior to Visit  Medication Sig Dispense Refill  . atorvastatin (LIPITOR) 40 MG tablet Take 1 tablet (40 mg total) by mouth daily. 90 tablet 3  . cetirizine (ZYRTEC) 10 MG tablet Take 10 mg by mouth daily.    . clopidogrel (PLAVIX) 75 MG tablet Take 1 tablet (75 mg total) by mouth daily with breakfast. 90 tablet 3  . co-enzyme Q-10 50 MG capsule Take 50 mg by mouth daily.        . Flax OIL Take 1 capsule by mouth daily.      . folic acid (FOLVITE) 1 MG tablet Take 1 tablet every day 90 tablet 3  . loratadine (CLARITIN) 10 MG tablet Take 10 mg by mouth daily.    . metoprolol (LOPRESSOR) 50 MG tablet TAKE 1 TABLET BY MOUTH TWO  TIMES DAILY 180 tablet 2  . Moexipril-Hydrochlorothiazide 15-12.5 MG TABS Take 1 tablet by mouth daily. 90 each 3  . Omega-3 Fatty Acids (FISH OIL) 1000 MG CAPS Take 1 capsule by mouth daily.       No current facility-administered medications on file prior to visit.     BP 132/82 (BP Location: Left Arm, Patient Position: Sitting, Cuff Size: Normal)   Ht 5\' 11"  (1.803 m)   Wt 173 lb 3.2 oz (78.6 kg)   BMI 24.16 kg/m       Objective:   Physical Exam  Constitutional: He is oriented to person, place, and time. He appears well-developed and well-nourished. No distress.  HENT:  Head: Normocephalic and atraumatic.  Right Ear: Hearing, tympanic membrane, external ear and ear canal normal.  Left Ear: Hearing, tympanic membrane, external ear and ear canal normal.  Nose: Nose normal.  Mouth/Throat: Uvula is midline, oropharynx is clear and moist and mucous membranes are normal. He has dentures. No oropharyngeal exudate.  Eyes: Conjunctivae and EOM are normal. Pupils are equal, round, and reactive to light. Right eye exhibits no discharge. Left eye exhibits no discharge. No scleral icterus.  Neck: Normal range of motion. Neck supple. No JVD present. Carotid bruit is not present. No tracheal deviation present. No thyromegaly present.  Cardiovascular: Normal rate, regular rhythm, normal heart sounds and intact distal pulses.  Exam reveals no gallop and no friction rub.   No murmur heard. Pulmonary/Chest: Effort normal and breath sounds normal. No stridor. No respiratory distress. He has no wheezes. He has no rales. He exhibits no tenderness.  Abdominal: Soft. Bowel sounds are normal. He exhibits no distension and no mass. There is no tenderness.  There is no rebound and no guarding.  Genitourinary:  Genitourinary Comments: Deferred   Musculoskeletal: Normal range of motion. He exhibits no edema, tenderness or deformity.  Walks with a walker - s/p stroke.   Lymphadenopathy:    He has no cervical adenopathy.  Neurological: He is alert and oriented to person, place, and time. He has normal reflexes. He displays normal reflexes. No cranial nerve deficit. He exhibits normal muscle tone. Coordination abnormal.  chronic LUE / LLE weakness from stroke   Skin: Skin is warm and dry. No rash noted. He is not diaphoretic. No erythema. No pallor.  Psychiatric: He has a normal mood and affect. His behavior is normal. Judgment and thought content  normal.  Nursing note and vitals reviewed.     Assessment & Plan:  1. Routine general medical examination at a health care facility - Reviewed fall precautions with patient and his wife. They do not want to do PT at this time. Advised to let me know if he continues to fall. I am concerned since he is on Plavix.  - Basic metabolic panel - CBC with Differential/Platelet - Hemoglobin A1c - Hepatic function panel - Lipid panel - PSA - TSH  2. Essential hypertension - Controlled with current medication  - Basic metabolic panel - CBC with Differential/Platelet - Hemoglobin A1c - Hepatic function panel - Lipid panel - PSA - TSH  3. Borderline hyperglycemia  - Basic metabolic panel - CBC with Differential/Platelet - Hemoglobin A1c - Hepatic function panel - Lipid panel - PSA - TSH  4. Atherosclerosis of coronary artery of native heart without angina pectoris, unspecified vessel or lesion type  - Basic metabolic panel - CBC with Differential/Platelet - Hemoglobin A1c - Hepatic function panel - Lipid panel - PSA - TSH - Has been controlled with statin   Shirline Frees, NP
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
## 2016-11-15 NOTE — Patient Instructions (Addendum)
It was great seeing you today   I will follow up with you regarding your blood work   Please follow up with me in one year or sooner if needed   Thomas [name] , Thank you for taking time to come for your Medicare Wellness Visit. I appreciate your ongoing commitment to your health goals. Please review the following plan we discussed and let me know if I can assist you in the future.   Can have a low does CT of  Your lung due to smoking hx. This would be a referral to pulmonary and a nurse practitioner would talk to you about this. You can let [name] know if you decide to pursue  You have had the Zoster in 2016; in 2018 we now have a new vaccine called the Shingrix;  Shingrix is a vaccine for the prevention of herpes Zoster or Shingles in Adults 50 and older.  If you are on Medicare, you can request a prescription from your doctor to be filled at a pharmacy.  Please check with your benefits regarding applicable copays or out of pocket expenses.  The Shingrix is given in 2 vaccines approx 8 weeks apart. You must receive the 2nd ose prior to 6 months from receipt of the first.   Can bring a copy of your AD or living will to the office to place on the chart     [facility]; [phone] Sr. Line; [phone] Get resource to get information on any and all community programs for Seniors  [place]: [phone]  Can have a hearing screen anywhere / medicare pays for hearing screen [facility] - can assist with hearing aid x 1  No reviews  [facility]  [address] [place] [address]  [phone]      These are the goals we discussed: Goals    . patient          To maintain; Continue to do your exercise        This is a list of the screening recommended for you and due dates:  Health Maintenance  Topic Date Due  . Flu Shot  03/13/2017  . Colon Cancer Screening  04/14/2017  . Tetanus Vaccine  06/26/2023  . Pneumonia vaccines  Completed       Health Maintenance, Male A healthy lifestyle and preventative care can promote health and wellness.  Maintain regular health, dental, and eye exams.  Eat a healthy diet. Foods like vegetables, fruits, whole grains, low-fat dairy products, and lean protein foods contain the nutrients you need and are low in calories. Decrease your intake of foods high in solid fats, added sugars, and salt. Get information about a proper diet from your health care provider, if necessary.  Regular physical exercise is one of the most important things you can do for your health. Most adults should get at least 150 minutes of moderate-intensity exercise (any activity that increases your heart rate and causes you to sweat) each week. In addition, most adults need muscle-strengthening exercises on 2 or more days a week.   Maintain a healthy weight. The body mass index (BMI) is a screening tool to identify possible weight problems. It provides an estimate of body fat based on height and weight. Your health care provider can find your BMI and can help you achieve or maintain a healthy weight. For males 20 years and older:  A BMI below 18.5 is considered underweight.  A BMI of  18.5 to 24.9 is normal.  A BMI of 25 to 29.9 is considered overweight.  A BMI of 30 and above is considered obese.  Maintain normal blood lipids and cholesterol by exercising and minimizing your intake of saturated fat. Eat a balanced diet with plenty of fruits and vegetables. Blood tests for lipids and cholesterol should begin at age 68 and be repeated every 5 years. If your lipid or cholesterol levels are high, you are over age 36, or you are at high risk for heart disease, you may need your cholesterol levels checked more frequently.Ongoing high lipid and cholesterol levels should be treated with medicines if diet and exercise are not working.  If you smoke, find out from your health care provider how to quit. If you do not use  tobacco, do not start.  Lung cancer screening is recommended for adults aged 55-80 years who are at high risk for developing lung cancer because of a history of smoking. A yearly low-dose CT scan of the lungs is recommended for people who have at least a 30-pack-year history of smoking and are current smokers or have quit within the past 15 years. A pack year of smoking is smoking an average of 1 pack of cigarettes a day for 1 year (for example, a 30-pack-year history of smoking could mean smoking 1 pack a day for 30 years or 2 packs a day for 15 years). Yearly screening should continue until the smoker has stopped smoking for at least 15 years. Yearly screening should be stopped for people who develop a health problem that would prevent them from having lung cancer treatment.  If you choose to drink alcohol, do not have more than 2 drinks per day. One drink is considered to be 12 oz (360 mL) of beer, 5 oz (150 mL) of wine, or 1.5 oz (45 mL) of liquor.  Avoid the use of street drugs. Do not share needles with anyone. Ask for help if you need support or instructions about stopping the use of drugs.  High blood pressure causes heart disease and increases the risk of stroke. High blood pressure is more likely to develop in:  People who have blood pressure in the end of the normal range (100-139/85-89 mm Hg).  People who are overweight or obese.  People who are African American.  If you are 18-20 years of age, have your blood pressure checked every 3-5 years. If you are 3 years of age or older, have your blood pressure checked every year. You should have your blood pressure measured twice--once when you are at a hospital or clinic, and once when you are not at a hospital or clinic. Record the average of the two measurements. To check your blood pressure when you are not at a hospital or clinic, you can use:  An automated blood pressure machine at a pharmacy.  A home blood pressure monitor.  If you  are 50-64 years old, ask your health care provider if you should take aspirin to prevent heart disease.  Diabetes screening involves taking a blood sample to check your fasting blood sugar level. This should be done once every 3 years after age 85 if you are at a normal weight and without risk factors for diabetes. Testing should be considered at a younger age or be carried out more frequently if you are overweight and have at least 1 risk factor for diabetes.  Colorectal cancer can be detected and often prevented. Most routine colorectal cancer screening begins  at the age of 77 and continues through age 84. However, your health care provider may recommend screening at an earlier age if you have risk factors for colon cancer. On a yearly basis, your health care provider may provide home test kits to check for hidden blood in the stool. A small camera at the end of a tube may be used to directly examine the colon (sigmoidoscopy or colonoscopy) to detect the earliest forms of colorectal cancer. Talk to your health care provider about this at age 49 when routine screening begins. A direct exam of the colon should be repeated every 5-10 years through age 57, unless early forms of precancerous polyps or small growths are found.  People who are at an increased risk for hepatitis B should be screened for this virus. You are considered at high risk for hepatitis B if:  You were born in a country where hepatitis B occurs often. Talk with your health care provider about which countries are considered high risk.  Your parents were born in a high-risk country and you have not received a shot to protect against hepatitis B (hepatitis B vaccine).  You have HIV or AIDS.  You use needles to inject street drugs.  You live with, or have sex with, someone who has hepatitis B.  You are a man who has sex with other men (MSM).  You get hemodialysis treatment.  You take certain medicines for conditions like cancer,  organ transplantation, and autoimmune conditions.  Hepatitis C blood testing is recommended for all people born from [birth year] through 1965 and any individual with known risk factors for hepatitis C.  Healthy men should no longer receive prostate-specific antigen (PSA) blood tests as part of routine cancer screening. Talk to your health care provider about prostate cancer screening.  Testicular cancer screening is not recommended for adolescents or adult males who have no symptoms. Screening includes self-exam, a health care provider exam, and other screening tests. Consult with your health care provider about any symptoms you have or any concerns you have about testicular cancer.  Practice safe sex. Use condoms and avoid high-risk sexual practices to reduce the spread of sexually transmitted infections (STIs).  You should be screened for STIs, including gonorrhea and chlamydia if:  You are sexually active and are younger than 24 years.  You are older than 24 years, and your health care provider tells you that you are at risk for this type of infection.  Your sexual activity has changed since you were last screened, and you are at an increased risk for chlamydia or gonorrhea. Ask your health care provider if you are at risk.  If you are at risk of being infected with HIV, it is recommended that you take a prescription medicine daily to prevent HIV infection. This is called pre-exposure prophylaxis (PrEP). You are considered at risk if:  You are a man who has sex with other men (MSM).  You are a heterosexual man who is sexually active with multiple partners.  You take drugs by injection.  You are sexually active with a partner who has HIV.  Talk with your health care provider about whether you are at high risk of being infected with HIV. If you choose to begin PrEP, you should first be tested for HIV. You should then be tested every 3 months for as long as you are taking PrEP.  Use sunscreen.  Apply sunscreen liberally and repeatedly throughout the day. You should seek shade when your shadow  is shorter than you. Protect yourself by wearing long sleeves, pants, a wide-brimmed hat, and sunglasses year round whenever you are outdoors.  Tell your health care provider of new moles or changes in moles, especially if there is a change in shape or color. Also, tell your health care provider if a mole is larger than the size of a pencil eraser.  A one-time screening for abdominal aortic aneurysm (AAA) and surgical repair of large AAAs by ultrasound is recommended for men aged 65-75 years who are current or former smokers.  Stay current with your vaccines (immunizations).   This information is not intended to replace advice given to you by your health care provider. Make sure you discuss any questions you have with your health care provider.   Document Released: 01/26/2008 Document Revised: 08/20/2014 Document Reviewed: 12/25/2010 Elsevier Interactive Patient Education 2016 [employer].  Fall Prevention in the Home Falls can cause injuries and can affect people from all age groups. There are many simple things that you can do to make your home safe and to help prevent falls. What can I do on the outside of my home?  Regularly repair the edges of walkways and driveways and fix any cracks.  Remove high doorway thresholds.  Trim any shrubbery on the main path into your home.  Use bright outdoor lighting.  Clear walkways of debris and clutter, including tools and rocks.  Regularly check that handrails are securely fastened and in good repair. Both sides of any steps should have handrails.  Install guardrails along the edges of any raised decks or porches.  Have leaves, snow, and ice cleared regularly.  Use sand or salt on walkways during winter months.  In the garage, clean up any spills right away, including grease or oil spills. What can I do in the bathroom?  Use night  lights.  Install grab bars by the toilet and in the tub and shower. Do not use towel bars as grab bars.  Use non-skid mats or decals on the floor of the tub or shower.  If you need to sit down while you are in the shower, use a plastic, non-slip stool.  Keep the floor dry. Immediately clean up any water that spills on the floor.  Remove soap buildup in the tub or shower on a regular basis.  Attach bath mats securely with double-sided non-slip rug tape.  Remove throw rugs and other tripping hazards from the floor. What can I do in the bedroom?  Use night lights.  Make sure that a bedside light is easy to reach.  Do not use oversized bedding that drapes onto the floor.  Have a firm chair that has side arms to use for getting dressed.  Remove throw rugs and other tripping hazards from the floor. What can I do in the kitchen?  Clean up any spills right away.  Avoid walking on wet floors.  Place frequently used items in easy-to-reach places.  If you need to reach for something above you, use a sturdy step stool that has a grab bar.  Keep electrical cables out of the way.  Do not use floor polish or wax that makes floors slippery. If you have to use wax, make sure that it is non-skid floor wax.  Remove throw rugs and other tripping hazards from the floor. What can I do in the stairways?  Do not leave any items on the stairs.  Make sure that there are handrails on both sides of the stairs.  Fix handrails that are broken or loose. Make sure that handrails are as long as the stairways.  Check any carpeting to make sure that it is firmly attached to the stairs. Fix any carpet that is loose or worn.  Avoid having throw rugs at the top or bottom of stairways, or secure the rugs with carpet tape to prevent them from moving.  Make sure that you have a light switch at the top of the stairs and the bottom of the stairs. If you do not have them, have them installed. What are some  other fall prevention tips?  Wear closed-toe shoes that fit well and support your feet. Wear shoes that have rubber soles or low heels.  When you use a stepladder, make sure that it is completely opened and that the sides are firmly locked. Have someone hold the ladder while you are using it. Do not climb a closed stepladder.  Add color or contrast paint or tape to grab bars and handrails in your home. Place contrasting color strips on the first and last steps.  Use mobility aids as needed, such as canes, walkers, scooters, and crutches.  Turn on lights if it is dark. Replace any light bulbs that burn out.  Set up furniture so that there are clear paths. Keep the furniture in the same spot.  Fix any uneven floor surfaces.  Choose a carpet design that does not hide the edge of steps of a stairway.  Be aware of any and all pets.  Review your medicines with your healthcare provider. Some medicines can cause dizziness or changes in blood pressure, which increase your risk of falling. Talk with your health care provider about other ways that you can decrease your risk of falls. This may include working with a physical therapist or trainer to improve your strength, balance, and endurance. This information is not intended to replace advice given to you by your health care provider. Make sure you discuss any questions you have with your health care provider. Document Released: 07/20/2002 Document Revised: 12/27/2015 Document Reviewed: 09/03/2014 Elsevier Interactive Patient Education  2017 [employer].

## 2016-12-04 ENCOUNTER — Encounter: Payer: Self-pay | Admitting: Adult Health

## 2016-12-05 ENCOUNTER — Other Ambulatory Visit: Payer: Self-pay | Admitting: Adult Health

## 2016-12-05 DIAGNOSIS — R2681 Unsteadiness on feet: Secondary | ICD-10-CM

## 2016-12-11 ENCOUNTER — Ambulatory Visit: Payer: Medicare Other | Attending: Adult Health

## 2016-12-11 DIAGNOSIS — R293 Abnormal posture: Secondary | ICD-10-CM | POA: Insufficient documentation

## 2016-12-11 DIAGNOSIS — R2689 Other abnormalities of gait and mobility: Secondary | ICD-10-CM | POA: Diagnosis not present

## 2016-12-11 DIAGNOSIS — M6281 Muscle weakness (generalized): Secondary | ICD-10-CM | POA: Insufficient documentation

## 2016-12-11 NOTE — Therapy (Signed)
[facility] [address], [address] [place], [place], [address] Phone: [phone]   Fax:  [phone]  Physical Therapy Evaluation  Patient Details  Name: Thomas [name]. MRN: [id number] Date of Birth: April 13, 1941 Referring Provider: Shirline Frees, PA  Encounter Date: 12/11/2016      PT End of Session - 12/11/16 1007    Visit Number 1   Number of Visits 10   Date for PT Re-Evaluation 02/05/17   PT Start Time 0931   PT Stop Time 1009   PT Time Calculation (min) 38 min   Activity Tolerance Patient tolerated treatment well   Behavior During Therapy [facility] for tasks assessed/performed      Past Medical History:  Diagnosis Date  . CHF (congestive heart failure) (HCC) 1999  . Coronary artery disease   . Hx of colonic polyps   . Hyperlipidemia   . Hyperplastic colon polyp 04/15/2007  . Hypertension   . Prostatic hypertrophy, benign    with elevated PSA  . TIA (transient ischemic attack) 2003   "mini stroke" (03/19/2013)    Past Surgical History:  Procedure Laterality Date  . CARDIAC CATHETERIZATION    . CATARACT EXTRACTION W/ INTRAOCULAR LENS  IMPLANT, BILATERAL Bilateral 1998  . COLONOSCOPY  2008  . CORONARY ARTERY BYPASS GRAFT  1999   x4    There were no vitals filed for this visit.       Subjective Assessment - 12/11/16 0933    Subjective Pt presents with unsteady gait and balance deficits especially with change of directions and when backing up.  Pt has had 2 falls when at home with change of direction.     Pertinent History 2 falls recently.     How long can you stand comfortably? 5 minutes    How long can you walk comfortably? 20 minutes with arm support/walker   Patient Stated Goals improve balance, endurance, reduce falls   Currently in Pain? No/denies            [facility] PT Assessment - 12/11/16 0001      Assessment   Medical Diagnosis unsteady gait   Referring Provider Shirline Frees, [place]   Onset  Date/Surgical Date 12/11/12   Next MD Visit none   Prior Therapy after CVA in 2014     Precautions   Precautions Fall     Restrictions   Weight Bearing Restrictions No     Balance Screen   Has the patient fallen in the past 6 months Yes   How many times? 1  PT will address balance and gait   Has the patient had a decrease in activity level because of a fear of falling?  No   Is the patient reluctant to leave their home because of a fear of falling?  No     Home Environment   Living Environment Private residence   Living Arrangements Spouse/significant other   Type of Home House   Home Access Stairs to enter   Entrance Stairs-Number of Steps 2   Home Layout Two level;1/2 bath on main level   Home Equipment Walker - 2 wheels;Walker - 4 wheels;Tub bench;Grab bars - tub/shower;Hand held shower head;Transport chair     Prior Function   Level of Independence Independent with household mobility with device;Needs assistance with ADLs;Requires assistive device for independence   Vocation Retired   Leisure none     Cognition   Overall Cognitive Status Within Functional Limits for tasks assessed  Posture/Postural Control   Posture/Postural Control Postural limitations   Postural Limitations Forward head;Rounded Shoulders;Flexed trunk     ROM / Strength   AROM / PROM / Strength AROM;PROM;Strength     AROM   Overall AROM  Within functional limits for tasks performed     PROM   Overall PROM  Within functional limits for tasks performed     Strength   Overall Strength Deficits   Strength Assessment Site Knee;Hip;Ankle   Right/Left Hip Right;Left   Right Hip Flexion 4-/5   Right Hip ABduction 4/5   Right Hip ADduction 4/5   Left Hip Flexion 3+/5   Left Hip ABduction 4/5   Left Hip ADduction 4/5   Right/Left Knee Right;Left   Right Knee Flexion 4/5   Right Knee Extension 4/5   Left Knee Flexion 4-/5   Left Knee Extension 4-/5   Right/Left Ankle Right;Left   Right  Ankle Dorsiflexion 4-/5   Left Ankle Dorsiflexion 3+/5     Transfers   Transfers Sit to Stand;Stand to Sit   Sit to Stand 5: Supervision;4: Min guard;With upper extremity assist;With armrests   Sit to Stand Details Verbal cues for technique;Verbal cues for precautions/safety   Five time sit to stand comments  not able to perform sit stand without UE support   Stand to Sit 5: Supervision;4: Min guard     Ambulation/Gait   Ambulation/Gait Yes   Ambulation/Gait Assistance 5: Supervision   Ambulation Distance (Feet) 100 Feet   Assistive device Rolling walker   Gait Pattern Step-through pattern;Decreased stride length;Decreased dorsiflexion - left;Decreased dorsiflexion - right;Trunk flexed;Wide base of support     Balance   Balance Assessed Yes     Standardized Balance Assessment   Standardized Balance Assessment Timed Up and Go Test     Timed Up and Go Test   TUG Normal TUG   Normal TUG (seconds) 27   TUG Comments with walker, high falls risk                           PT Education - 12/11/16 0955    Education provided Yes   Education Details seated LE strength   Person(s) Educated Patient;Spouse   Methods Explanation;Demonstration;Handout   Comprehension Verbalized understanding;Returned demonstration          PT Short Term Goals - 12/11/16 1013      PT SHORT TERM GOAL #1   Title be independent in initial HEP   Time 4   Period Weeks     PT SHORT TERM GOAL #2   Title verbalize and demonstrate understanding of fall prevention strategies   Time 4   Period Weeks   Status New     PT SHORT TERM GOAL #3   Title improve LE strength to perform sit to stand with moderate UE support   Time 4   Period Weeks   Status New     PT SHORT TERM GOAL #4   Title perform TUG in < or = to 20 seconds to reduce falls risk   Time 4   Period Weeks   Status New           PT Long Term Goals - 12/11/16 1020      PT LONG TERM GOAL #1   Title be independent  in advanced HEP   Time 8   Period Weeks   Status New     PT LONG TERM GOAL #2  Title perform TUG in < or = to 18 seconds to reduce falls risk   Time 8   Period Weeks   Status New     PT LONG TERM GOAL #3   Title improve LE strength to perform stand to sit transition with controlled descent   Time 8   Period Weeks   Status New     PT LONG TERM GOAL #4   Title stand for 10 minutes for ADLs without limitation   Time 8   Period Weeks   Status New               Plan - 01/01/2017 1008    Clinical Impression Statement Pt presents to PT with 2 recent falls, balance deficits, gait abnormality and endurance deficits of a chronic nature that has been declining over the past months. Pt with poor standing posture, max UE support with sit to stand, reduced safety and uncontrolled descent with stand to sit, LE weakness and TUG 27 seconds.  Pt is limited to walking 20 minutes and standing 5 minutes at home. Pt is a moderate complexity evaluation due to addressing 3 elements, comorbidities including CVA and heat failure that will impact care and evolving condition.  Pt will benefit from skilled PT for LE strength progression, sit to stand, balance and gait training to improve safety and reduce falls risk.     Rehab Potential Good   PT Frequency 2x / week   PT Duration 8 weeks   PT Treatment/Interventions ADLs/Self Care Home Management;Cryotherapy;Functional mobility training;Stair training;Gait training;Moist Heat;Therapeutic activities;Therapeutic exercise;Balance training;Neuromuscular re-education;Patient/family education;Passive range of motion;Manual techniques   PT Next Visit Plan Balance, enduranance, LE/UE strength   Consulted and Agree with Plan of Care Patient      Patient will benefit from skilled therapeutic intervention in order to improve the following deficits and impairments:  Abnormal gait, Decreased coordination, Difficulty walking, Postural dysfunction, Decreased strength,  Decreased mobility, Decreased balance, Decreased activity tolerance  Visit Diagnosis: Other abnormalities of gait and mobility - Plan: PT plan of care cert/re-cert  Muscle weakness (generalized) - Plan: PT plan of care cert/re-cert  Abnormal posture - Plan: PT plan of care cert/re-cert      G-Codes - Jan 01, 2017 1007    Functional Assessment Tool Used (Outpatient Only) TUG: 27 seconds   Functional Limitation Mobility: Walking and moving around   Mobility: Walking and Moving Around Current Status [phone]) At least 60 percent but less than 80 percent impaired, limited or restricted   Mobility: Walking and Moving Around Goal Status [phone]) At least 40 percent but less than 60 percent impaired, limited or restricted       Problem List Patient Active Problem List   Diagnosis Date Noted  . Left hemiparesis (HCC) 03/19/2013  . Cerebral infarction (HCC) 03/19/2013  . Left leg weakness 03/19/2013  . CAD, ARTERY BYPASS GRAFT 09/30/2008  . 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 Borderline hyperglycemia 12/03/2007  . Other and unspecified hyperlipidemia 07/28/2007  . Essential hypertension 07/28/2007  . 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 Coronary atherosclerosis 07/28/2007  . BENIGN PROSTATIC HYPERTROPHY 07/28/2007  . COLONIC POLYPS, 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     Lorrene Reid, PT Jan 01, 2017 10:25 AM  [facility] [address], [address] [place], [place], [address] Phone: [phone]   Fax:  [phone]  Name: Chanan Detwiler. MRN: [id number] Date of Birth: 28-Mar-1941

## 2016-12-11 NOTE — Patient Instructions (Addendum)
KNEE: Extension, Long Arc Quad (Weight)  Place weight around leg. Raise leg until knee is straight. Hold _5__ seconds. Use ___ lb weight. _10__ reps per set (each leg), 4-5__ sets per day, __7_ days per week      Knee Raise   Lift knee and then lower it. Repeat with other knee. Repeat _10__ times each leg. Do _4-5___ sessions per day.  http://gt2.exer.us/445   Copyright  VHI. All rights reserved.  Toe Up   Gently rise up on toes and back on heels. Repeat _20___ times. Do 4-5____ sessions per day.  Adduction: Hip - Knees Together (Sitting)    Sit with towel roll between knees. Push knees together. Hold for _5_ seconds. Repeat _10-20__ times. Do __4-5_ times a day.  Copyright  VHI. All rights reserved.   [facility] [address] [place], [place] [address] Phone # [phone] Fax [phone]

## 2016-12-14 ENCOUNTER — Ambulatory Visit: Payer: Medicare Other | Admitting: Physical Therapy

## 2016-12-14 ENCOUNTER — Encounter: Payer: Self-pay | Admitting: Physical Therapy

## 2016-12-14 DIAGNOSIS — R2689 Other abnormalities of gait and mobility: Secondary | ICD-10-CM | POA: Diagnosis not present

## 2016-12-14 DIAGNOSIS — R293 Abnormal posture: Secondary | ICD-10-CM

## 2016-12-14 DIAGNOSIS — M6281 Muscle weakness (generalized): Secondary | ICD-10-CM

## 2016-12-14 NOTE — Therapy (Signed)
[facility] [address], [address] [place], [place], [address] Phone: [phone]   Fax:  [phone]  Physical Therapy Treatment  Patient Details  Name: Thomas [name]. MRN: [id number] Date of Birth: [birth year] Referring Provider: Shirline Frees, PA  Encounter Date: 12/14/2016      PT End of Session - 12/14/16 0933    Visit Number 2   Number of Visits 10   Date for PT Re-Evaluation 02/05/17   PT Start Time 0933   PT Stop Time 1013   PT Time Calculation (min) 40 min   Activity Tolerance Patient tolerated treatment well   Behavior During Therapy [facility] for tasks assessed/performed      Past Medical History:  Diagnosis Date  . CHF (congestive heart failure) (HCC) 1999  . Coronary artery disease   . Hx of colonic polyps   . Hyperlipidemia   . Hyperplastic colon polyp 04/15/2007  . Hypertension   . Prostatic hypertrophy, benign    with elevated PSA  . TIA (transient ischemic attack) 2003   "mini stroke" (03/19/2013)    Past Surgical History:  Procedure Laterality Date  . CARDIAC CATHETERIZATION    . CATARACT EXTRACTION W/ INTRAOCULAR LENS  IMPLANT, BILATERAL Bilateral 1998  . COLONOSCOPY  2008  . CORONARY ARTERY BYPASS GRAFT  1999   x4    There were no vitals filed for this visit.      Subjective Assessment - 12/14/16 0933    Subjective Pt states he is doing well today.  Walks in with RW today.  Reports he did the exercises before he came up here.  "Exercises are good."   Pertinent History 2 falls recently.     How long can you stand comfortably? 5 minutes    How long can you walk comfortably? 20 minutes with arm support/walker   Patient Stated Goals improve balance, endurance, reduce falls   Currently in Pain? No/denies                         [facility] Adult PT Treatment/Exercise - 12/14/16 0001      Knee/Hip Exercises: Aerobic   Nustep L1 x 6 min     Knee/Hip Exercises: Standing   Heel  Raises 10 reps   Other Standing Knee Exercises weight shifting side to side     Knee/Hip Exercises: Seated   Long Arc Quad Strengthening;Both;10 reps  3 sec hold, 1lb   Ball Squeeze 15 x 5 sec hold   Clamshell with TheraBand Green  20x   Marching Strengthening;Both;10 reps;Weights   Marching Weights 1 lbs.   Hamstring Curl Strengthening;Both;1 set;15 reps  green band                PT Education - 12/14/16 1022    Education provided Yes   Education Details hamstring and gastroc stretch   Person(s) Educated Patient;Spouse   Methods Explanation;Demonstration;Verbal cues;Handout   Comprehension Verbalized understanding;Returned demonstration          PT Short Term Goals - 12/11/16 1013      PT SHORT TERM GOAL #1   Title be independent in initial HEP   Time 4   Period Weeks     PT SHORT TERM GOAL #2   Title verbalize and demonstrate understanding of fall prevention strategies   Time 4   Period Weeks   Status New     PT SHORT TERM GOAL #3   Title improve LE strength  to perform sit to stand with moderate UE support   Time 4   Period Weeks   Status New     PT SHORT TERM GOAL #4   Title perform TUG in < or = to 20 seconds to reduce falls risk   Time 4   Period Weeks   Status New           PT Long Term Goals - 12/11/16 1020      PT LONG TERM GOAL #1   Title be independent in advanced HEP   Time 8   Period Weeks   Status New     PT LONG TERM GOAL #2   Title perform TUG in < or = to 18 seconds to reduce falls risk   Time 8   Period Weeks   Status New     PT LONG TERM GOAL #3   Title improve LE strength to perform stand to sit transition with controlled descent   Time 8   Period Weeks   Status New     PT LONG TERM GOAL #4   Title stand for 10 minutes for ADLs without limitation   Time 8   Period Weeks   Status New               Plan - 12/14/16 1023    Clinical Impression Statement Pt was fatigued at the end of session with  increase locking of left knee during gait.  Needs contact guard with sit to stand and close supervision with standing exercises.  Pt able to tolerate exercises well and performs correctly with cues.  Difficulty sitting upright away from back of the chair for longer than a few seconds at a time.  No progress towards goals due to first treatment session.  Skilled PT needed for increased strength to reduce risk of falls.   Rehab Potential Good   PT Treatment/Interventions ADLs/Self Care Home Management;Cryotherapy;Functional mobility training;Stair training;Gait training;Moist Heat;Therapeutic activities;Therapeutic exercise;Balance training;Neuromuscular re-education;Patient/family education;Passive range of motion;Manual techniques   PT Next Visit Plan Balance, enduranance, LE/UE strength   Consulted and Agree with Plan of Care Patient      Patient will benefit from skilled therapeutic intervention in order to improve the following deficits and impairments:  Abnormal gait, Decreased coordination, Difficulty walking, Postural dysfunction, Decreased strength, Decreased mobility, Decreased balance, Decreased activity tolerance  Visit Diagnosis: Other abnormalities of gait and mobility  Muscle weakness (generalized)  Abnormal posture     Problem List Patient Active Problem List   Diagnosis Date Noted  . Left hemiparesis (HCC) 03/19/2013  . Cerebral infarction (HCC) 03/19/2013  . Left leg weakness 03/19/2013  . CAD, ARTERY BYPASS GRAFT 09/30/2008  . 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 Borderline hyperglycemia 12/03/2007  . Other and unspecified hyperlipidemia 07/28/2007  . Essential hypertension 07/28/2007  . 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 Coronary atherosclerosis 07/28/2007  . BENIGN PROSTATIC HYPERTROPHY 07/28/2007  . COLONIC POLYPS, 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    Vincente Poli, PT 12/14/2016, 10:26 AM  [facility] [address] [place], [place], [address] Phone: [phone]   Fax:  [phone]  Name: Thomas [name]. MRN: [id number] Date of Birth: 03-11-1941

## 2016-12-14 NOTE — Patient Instructions (Signed)
Gastroc, Sitting (Passive)    Sit with strap or towel around ball of foot. Gently pull toward body. Hold __15_ seconds.  Repeat _3__ times per session. Do _2__ sessions per day.  Copyright  VHI. All rights reserved.  Chair Sitting    Sit at edge of seat, spine straight, one leg extended. Put a hand on each thigh and bend forward from the hip, keeping spine straight. Allow hand on extended leg to reach toward toes. Support upper body with other arm. Hold _15__ seconds. Repeat __3_ times per session. Do _2__ sessions per day.  Copyright  VHI. All rights reserved.

## 2016-12-18 ENCOUNTER — Encounter: Payer: Self-pay | Admitting: Physical Therapy

## 2016-12-18 ENCOUNTER — Ambulatory Visit: Payer: Medicare Other | Admitting: Physical Therapy

## 2016-12-18 DIAGNOSIS — M6281 Muscle weakness (generalized): Secondary | ICD-10-CM

## 2016-12-18 DIAGNOSIS — R2689 Other abnormalities of gait and mobility: Secondary | ICD-10-CM

## 2016-12-18 DIAGNOSIS — R293 Abnormal posture: Secondary | ICD-10-CM

## 2016-12-18 NOTE — Therapy (Signed)
[facility] [address] [place], [place], [address] Phone: [phone]   Fax:  [phone]  Physical Therapy Treatment  Patient Details  Name: Thomas [name]. MRN: [id number] Date of Birth: [birth year] Referring Provider: Shirline Frees, PA  Encounter Date: 12/18/2016      PT End of Session - 12/18/16 1016    Visit Number 3   Number of Visits 10   Date for PT Re-Evaluation 02/05/17   PT Start Time 1014   PT Stop Time 1054   PT Time Calculation (min) 40 min   Activity Tolerance Patient tolerated treatment well   Behavior During Therapy [facility] for tasks assessed/performed      Past Medical History:  Diagnosis Date  . CHF (congestive heart failure) (HCC) 1999  . Coronary artery disease   . Hx of colonic polyps   . Hyperlipidemia   . Hyperplastic colon polyp 04/15/2007  . Hypertension   . Prostatic hypertrophy, benign    with elevated PSA  . TIA (transient ischemic attack) 2003   "mini stroke" (03/19/2013)    Past Surgical History:  Procedure Laterality Date  . CARDIAC CATHETERIZATION    . CATARACT EXTRACTION W/ INTRAOCULAR LENS  IMPLANT, BILATERAL Bilateral 1998  . COLONOSCOPY  2008  . CORONARY ARTERY BYPASS GRAFT  1999   x4    There were no vitals filed for this visit.      Subjective Assessment - 12/18/16 1010    Subjective Pt reports doing well today.    Pertinent History 2 falls recently.     How long can you stand comfortably? 5 minutes    How long can you walk comfortably? 20 minutes with arm support/walker   Patient Stated Goals improve balance, endurance, reduce falls                         [facility] Adult PT Treatment/Exercise - 12/18/16 0001      Knee/Hip Exercises: Aerobic   Nustep L1 x 8 min  Therapist present to discuss treatment (seat 11)     Knee/Hip Exercises: Standing   Heel Raises 15 reps   Knee Flexion AROM;Both;2 sets;10 reps   Hip Abduction AROM;Both;2  sets;10 reps   Hip Extension AROM;Both;2 sets;10 reps   Other Standing Knee Exercises weight shifting side to side     Knee/Hip Exercises: Seated   Long Arc Quad Strengthening;Both;10 reps  #2   Long Arc Quad Weight 2 lbs.   Ball Squeeze 15 x 5 sec hold   Clamshell with TheraBand Green  20x   Marching Strengthening;Both;10 reps;Weights   Marching Weights 2 lbs.   Hamstring Curl Strengthening;Both;1 set;15 reps  green band   Sit to Sand 1 set;5 reps  Verbal cues to use legs more than arms                  PT Short Term Goals - 12/18/16 1016      PT SHORT TERM GOAL #1   Title be independent in initial HEP   Status Achieved     PT SHORT TERM GOAL #2   Title verbalize and demonstrate understanding of fall prevention strategies   Time 4   Period Weeks   Status On-going     PT SHORT TERM GOAL #3   Title improve LE strength to perform sit to stand with moderate UE support   Time 4   Period Weeks   Status On-going  PT SHORT TERM GOAL #4   Title perform TUG in < or = to 20 seconds to reduce falls risk   Time 4   Period Weeks   Status On-going           PT Long Term Goals - 12/18/16 1017      PT LONG TERM GOAL #1   Title be independent in advanced HEP   Time 8   Period Weeks   Status On-going     PT LONG TERM GOAL #2   Title perform TUG in < or = to 18 seconds to reduce falls risk   Time 8   Period Weeks   Status On-going     PT LONG TERM GOAL #3   Title improve LE strength to perform stand to sit transition with controlled descent   Time 8   Period Weeks   Status On-going     PT LONG TERM GOAL #4   Title stand for 10 minutes for ADLs without limitation   Time 8   Period Weeks   Status On-going               Plan - 12/18/16 1030    Clinical Impression Statement Patient has decreased strength in Lt LE from previous stroke. Patient locks Lt knee with standing and with gait. Patient did well with all strengthening exercises in  seated and standing. Patient reports feeling well at end of session not too fatigued. Patient will continue to benefit from skilled thearpy for strengthening and endurance.    Rehab Potential Good   PT Frequency 2x / week   PT Duration 8 weeks   PT Treatment/Interventions ADLs/Self Care Home Management;Cryotherapy;Functional mobility training;Stair training;Gait training;Moist Heat;Therapeutic activities;Therapeutic exercise;Balance training;Neuromuscular re-education;Patient/family education;Passive range of motion;Manual techniques   PT Next Visit Plan Balance, enduranance, LE/UE strength   Consulted and Agree with Plan of Care Patient      Patient will benefit from skilled therapeutic intervention in order to improve the following deficits and impairments:  Abnormal gait, Decreased coordination, Difficulty walking, Postural dysfunction, Decreased strength, Decreased mobility, Decreased balance, Decreased activity tolerance  Visit Diagnosis: Other abnormalities of gait and mobility  Muscle weakness (generalized)  Abnormal posture     Problem List Patient Active Problem List   Diagnosis Date Noted  . Left hemiparesis (HCC) 03/19/2013  . Cerebral infarction (HCC) 03/19/2013  . Left leg weakness 03/19/2013  . CAD, ARTERY BYPASS GRAFT 09/30/2008  . 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 Borderline hyperglycemia 12/03/2007  . Other and unspecified hyperlipidemia 07/28/2007  . Essential hypertension 07/28/2007  . 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 Coronary atherosclerosis 07/28/2007  . BENIGN PROSTATIC HYPERTROPHY 07/28/2007  . COLONIC POLYPS, 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    Dessa Phi PTA 12/18/2016, 12:22 PM  [facility] [address] [place], [place], [address] Phone: [phone]   Fax:  [phone]  Name: Mal Griscom. MRN: [id number] Date of Birth: 02-Aug-1941

## 2016-12-21 ENCOUNTER — Ambulatory Visit: Payer: Medicare Other | Admitting: Physical Therapy

## 2016-12-21 ENCOUNTER — Encounter: Payer: Self-pay | Admitting: Physical Therapy

## 2016-12-21 DIAGNOSIS — R293 Abnormal posture: Secondary | ICD-10-CM

## 2016-12-21 DIAGNOSIS — M6281 Muscle weakness (generalized): Secondary | ICD-10-CM

## 2016-12-21 DIAGNOSIS — R2689 Other abnormalities of gait and mobility: Secondary | ICD-10-CM

## 2016-12-21 NOTE — Therapy (Signed)
[facility] [address] [place], [place], [address] Phone: [phone]   Fax:  [phone]  Physical Therapy Treatment  Patient Details  Name: Thomas [name]. MRN: [id number] Date of Birth: 1940-12-10 Referring Provider: Shirline Frees, PA  Encounter Date: 12/21/2016      PT End of Session - 12/21/16 0929    Visit Number 4   Number of Visits 10   Date for PT Re-Evaluation 02/05/17   PT Start Time 0930   PT Stop Time 1012   PT Time Calculation (min) 42 min   Activity Tolerance Patient tolerated treatment well   Behavior During Therapy [facility] for tasks assessed/performed      Past Medical History:  Diagnosis Date  . CHF (congestive heart failure) (HCC) 1999  . Coronary artery disease   . Hx of colonic polyps   . Hyperlipidemia   . Hyperplastic colon polyp 04/15/2007  . Hypertension   . Prostatic hypertrophy, benign    with elevated PSA  . TIA (transient ischemic attack) 2003   "mini stroke" (03/19/2013)    Past Surgical History:  Procedure Laterality Date  . CARDIAC CATHETERIZATION    . CATARACT EXTRACTION W/ INTRAOCULAR LENS  IMPLANT, BILATERAL Bilateral 1998  . COLONOSCOPY  2008  . CORONARY ARTERY BYPASS GRAFT  1999   x4    There were no vitals filed for this visit.      Subjective Assessment - 12/21/16 0929    Subjective No complaints this AM   Currently in Pain? No/denies   Multiple Pain Sites No                         [facility] Adult PT Treatment/Exercise - 12/21/16 0001      Therapeutic Activites    Therapeutic Activities --  Sitting safely into chair using RW, Sit to stand 5x VC contr     Knee/Hip Exercises: Aerobic   Nustep L2 x10 min  Thomas [name] monitored     Knee/Hip Exercises: Standing   Heel Raises --  2x10   Knee Flexion AROM;Both;2 sets;10 reps  2#   Hip Abduction AROM;Both;2 sets;10 reps  2#   Gait Training Ambulate 2 laps 2x with RW   SBA   Other Standing  Knee Exercises weight shifting side to side     Knee/Hip Exercises: Seated   Long Arc Quad Strengthening;Both;10 reps  #2   Long Arc Quad Weight 2 lbs.   Ball Squeeze 25x   Clamshell with TheraBand --  Green 2x10, TC for hip symmetry and coordination   Marching --  Lt only 2# 10x, then alternating 2# 10x   Marching Weights 2 lbs.   Sit to Sand 1 set;5 reps  Verbal cues to use legs more than arms                  PT Short Term Goals - 12/18/16 1016      PT SHORT TERM GOAL #1   Title be independent in initial HEP   Status Achieved     PT SHORT TERM GOAL #2   Title verbalize and demonstrate understanding of fall prevention strategies   Time 4   Period Weeks   Status On-going     PT SHORT TERM GOAL #3   Title improve LE strength to perform sit to stand with moderate UE support   Time 4   Period Weeks   Status On-going  PT SHORT TERM GOAL #4   Title perform TUG in < or = to 20 seconds to reduce falls risk   Time 4   Period Weeks   Status On-going           PT Long Term Goals - 12/18/16 1017      PT LONG TERM GOAL #1   Title be independent in advanced HEP   Time 8   Period Weeks   Status On-going     PT LONG TERM GOAL #2   Title perform TUG in < or = to 18 seconds to reduce falls risk   Time 8   Period Weeks   Status On-going     PT LONG TERM GOAL #3   Title improve LE strength to perform stand to sit transition with controlled descent   Time 8   Period Weeks   Status On-going     PT LONG TERM GOAL #4   Title stand for 10 minutes for ADLs without limitation   Time 8   Period Weeks   Status On-going               Plan - 12/21/16 0929    Clinical Impression Statement Pt initially had trouble coordinating LE movements ( LT > RT ), with some verbal cuing, slowing pt down and some tactile cuing pt was able to coordinate and find improved control of his movements. Pt did not demonstrate safety when sititng into the chair and less  control descending into the chair. This was worked on in the session. No significant fatigue at end of session.     Rehab Potential Good   PT Frequency 2x / week   PT Duration 8 weeks   PT Treatment/Interventions ADLs/Self Care Home Management;Cryotherapy;Functional mobility training;Stair training;Gait training;Moist Heat;Therapeutic activities;Therapeutic exercise;Balance training;Neuromuscular re-education;Patient/family education;Passive range of motion;Manual techniques   PT Next Visit Plan Balance, enduranance, LE/UE strength   Consulted and Agree with Plan of Care --      Patient will benefit from skilled therapeutic intervention in order to improve the following deficits and impairments:  Abnormal gait, Decreased coordination, Difficulty walking, Postural dysfunction, Decreased strength, Decreased mobility, Decreased balance, Decreased activity tolerance  Visit Diagnosis: Other abnormalities of gait and mobility  Muscle weakness (generalized)  Abnormal posture     Problem List Patient Active Problem List   Diagnosis Date Noted  . Left hemiparesis (HCC) 03/19/2013  . Cerebral infarction (HCC) 03/19/2013  . Left leg weakness 03/19/2013  . CAD, ARTERY BYPASS GRAFT 09/30/2008  . 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 Borderline hyperglycemia 12/03/2007  . Other and unspecified hyperlipidemia 07/28/2007  . Essential hypertension 07/28/2007  . 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 Coronary atherosclerosis 07/28/2007  . BENIGN PROSTATIC HYPERTROPHY 07/28/2007  . COLONIC POLYPS, 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    Thomas [name], Thomas [name] 12/21/2016, 10:07 AM  [facility] [address], [address] [place], [place], [address] Phone: [phone]   Fax:  [phone]  Name: Thomas [name]. MRN: [id number] Date of Birth: 19-Mar-1941

## 2016-12-25 ENCOUNTER — Encounter: Payer: Self-pay | Admitting: Physical Therapy

## 2016-12-25 ENCOUNTER — Ambulatory Visit: Payer: Medicare Other | Admitting: Physical Therapy

## 2016-12-25 DIAGNOSIS — M6281 Muscle weakness (generalized): Secondary | ICD-10-CM

## 2016-12-25 DIAGNOSIS — R2689 Other abnormalities of gait and mobility: Secondary | ICD-10-CM

## 2016-12-25 DIAGNOSIS — R293 Abnormal posture: Secondary | ICD-10-CM

## 2016-12-25 NOTE — Telephone Encounter (Signed)
Called to see if pt wanted to schedule awv - left message.  

## 2016-12-25 NOTE — Therapy (Signed)
[facility] [address], [address] [place], [place], [address] Phone: [phone]   Fax:  [phone]  Physical Therapy Treatment  Patient Details  Name: Thomas [name]. MRN: [id number] Date of Birth: 1940/11/13 Referring Provider: Shirline Frees, PA  Encounter Date: 12/25/2016      PT End of Session - 12/25/16 1013    Visit Number 5   Number of Visits 10   Date for PT Re-Evaluation 02/05/17   PT Start Time 1011   PT Stop Time 1058   PT Time Calculation (min) 47 min   Activity Tolerance Patient tolerated treatment well   Behavior During Therapy [facility] for tasks assessed/performed      Past Medical History:  Diagnosis Date  . CHF (congestive heart failure) (HCC) 1999  . Coronary artery disease   . Hx of colonic polyps   . Hyperlipidemia   . Hyperplastic colon polyp 04/15/2007  . Hypertension   . Prostatic hypertrophy, benign    with elevated PSA  . TIA (transient ischemic attack) 2003   "mini stroke" (03/19/2013)    Past Surgical History:  Procedure Laterality Date  . CARDIAC CATHETERIZATION    . CATARACT EXTRACTION W/ INTRAOCULAR LENS  IMPLANT, BILATERAL Bilateral 1998  . COLONOSCOPY  2008  . CORONARY ARTERY BYPASS GRAFT  1999   x4    There were no vitals filed for this visit.      Subjective Assessment - 12/25/16 1013    Subjective Patient reports no pain    Pertinent History 2 falls recently.     How long can you stand comfortably? 5 minutes    How long can you walk comfortably? 20 minutes with arm support/walker   Patient Stated Goals improve balance, endurance, reduce falls   Currently in Pain? No/denies   Multiple Pain Sites No                         [facility] Adult PT Treatment/Exercise - 12/25/16 0001      Therapeutic Activites    Therapeutic Activities --  Walking, sit to stand, standing balance     Knee/Hip Exercises: Aerobic   Nustep L1 x10 min  PTA monitored; LE only      Knee/Hip Exercises: Standing   Heel Raises --  2x10   Knee Flexion AROM;Both;2 sets;10 reps  2#   Hip Flexion AROM;Both;20 reps  Marching on blue foam mat   Hip Abduction AROM;Both;2 sets;10 reps  2#   Forward Step Up Both  Alternating toe taps x20   Gait Training Ambulate 2 laps 2x with RW   SBA   Other Standing Knee Exercises weight shifting side to side and front to back     Knee/Hip Exercises: Seated   Long Arc Quad Strengthening;Both;10 reps  #2   Long Arc Quad Weight 2 lbs.   Ball Squeeze 25x  With LAQ   Clamshell with TheraBand --  Green 2x10, TC for hip symmetry and coordination   Sit to Sand 1 set;5 reps  Seated on blue mat for extra clearance                  PT Short Term Goals - 12/25/16 1013      PT SHORT TERM GOAL #2   Title verbalize and demonstrate understanding of fall prevention strategies   Time 4   Period Weeks   Status Achieved     PT SHORT TERM GOAL #3  Title improve LE strength to perform sit to stand with moderate UE support   Time 4   Period Weeks   Status On-going     PT SHORT TERM GOAL #4   Title perform TUG in < or = to 20 seconds to reduce falls risk   Time 4   Period Weeks   Status On-going           PT Long Term Goals - 12/25/16 1128      PT LONG TERM GOAL #1   Title be independent in advanced HEP   Time 8   Period Weeks   Status On-going     PT LONG TERM GOAL #2   Title perform TUG in < or = to 18 seconds to reduce falls risk   Time 8   Period Weeks   Status On-going               Plan - 12/25/16 1032    Clinical Impression Statement Patient needing verbal cues to slow down and control movements to improve coordination with exercises. Patient also needing verbal cues to unlock knees in standing. Patient did well with all standing strengthening and balance exercises.  Patient able to do Nustep for 10 minutes with LE only. Patient continues to use UE excessivlley for sit to stand even with verbal  cues to use LE. Patient will contnue to benefit from skilled thearpy for LE strengthening and core stability.    Rehab Potential Good   PT Frequency 2x / week   PT Duration 8 weeks   PT Treatment/Interventions ADLs/Self Care Home Management;Cryotherapy;Functional mobility training;Stair training;Gait training;Moist Heat;Therapeutic activities;Therapeutic exercise;Balance training;Neuromuscular re-education;Patient/family education;Passive range of motion;Manual techniques   PT Next Visit Plan Balance, enduranance, LE/UE strength   Consulted and Agree with Plan of Care Patient      Patient will benefit from skilled therapeutic intervention in order to improve the following deficits and impairments:  Abnormal gait, Decreased coordination, Difficulty walking, Postural dysfunction, Decreased strength, Decreased mobility, Decreased balance, Decreased activity tolerance  Visit Diagnosis: Other abnormalities of gait and mobility  Muscle weakness (generalized)  Abnormal posture     Problem List Patient Active Problem List   Diagnosis Date Noted  . Left hemiparesis (HCC) 03/19/2013  . Cerebral infarction (HCC) 03/19/2013  . Left leg weakness 03/19/2013  . CAD, ARTERY BYPASS GRAFT 09/30/2008  . 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 Borderline hyperglycemia 12/03/2007  . Other and unspecified hyperlipidemia 07/28/2007  . Essential hypertension 07/28/2007  . 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 Coronary atherosclerosis 07/28/2007  . BENIGN PROSTATIC HYPERTROPHY 07/28/2007  . COLONIC POLYPS, 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    Dessa Phi PTA 12/25/2016, 11:30 AM  [facility] [address] [place], [place], [address] Phone: [phone]   Fax:  [phone]  Name: Thomas [name]. MRN: [id number] Date of Birth: May 06, 1941

## 2016-12-27 ENCOUNTER — Encounter: Payer: Self-pay | Admitting: Physical Therapy

## 2016-12-27 ENCOUNTER — Ambulatory Visit: Payer: Medicare Other | Admitting: Physical Therapy

## 2016-12-27 DIAGNOSIS — R2689 Other abnormalities of gait and mobility: Secondary | ICD-10-CM | POA: Diagnosis not present

## 2016-12-27 DIAGNOSIS — R293 Abnormal posture: Secondary | ICD-10-CM

## 2016-12-27 DIAGNOSIS — M6281 Muscle weakness (generalized): Secondary | ICD-10-CM

## 2016-12-27 NOTE — Therapy (Signed)
[facility] [address], [address] [place], [place], [address] Phone: [phone]   Fax:  [phone]  Physical Therapy Treatment  Patient Details  Name: Thomas [name]. MRN: [id number] Date of Birth: 10/22/1940 Referring Provider: Shirline Frees, PA  Encounter Date: 12/27/2016      PT End of Session - 12/27/16 0930    Visit Number 6   Number of Visits 10   Date for PT Re-Evaluation 02/05/17   PT Start Time 0926   PT Stop Time 1013   PT Time Calculation (min) 47 min   Activity Tolerance Patient tolerated treatment well   Behavior During Therapy [facility] for tasks assessed/performed      Past Medical History:  Diagnosis Date  . CHF (congestive heart failure) (HCC) 1999  . Coronary artery disease   . Hx of colonic polyps   . Hyperlipidemia   . Hyperplastic colon polyp 04/15/2007  . Hypertension   . Prostatic hypertrophy, benign    with elevated PSA  . TIA (transient ischemic attack) 2003   "mini stroke" (03/19/2013)    Past Surgical History:  Procedure Laterality Date  . CARDIAC CATHETERIZATION    . CATARACT EXTRACTION W/ INTRAOCULAR LENS  IMPLANT, BILATERAL Bilateral 1998  . COLONOSCOPY  2008  . CORONARY ARTERY BYPASS GRAFT  1999   x4    There were no vitals filed for this visit.      Subjective Assessment - 12/27/16 0929    Subjective Patient with no pain today   Pertinent History 2 falls recently.     How long can you stand comfortably? 5 minutes    How long can you walk comfortably? 20 minutes with arm support/walker   Patient Stated Goals improve balance, endurance, reduce falls   Currently in Pain? No/denies   Multiple Pain Sites No                         [facility] Adult PT Treatment/Exercise - 12/27/16 0001      Therapeutic Activites    Therapeutic Activities --  Walking, sit to stand, standing balance     Lumbar Exercises: Aerobic   Stationary Bike 5 minutes L1     Lumbar  Exercises: Standing   Row Strengthening;Power tower  #20; CGA   Shoulder Extension Strengthening;Power Tower  #20; CGA     Knee/Hip Exercises: Aerobic   Nustep L1 x10 min  PTA monitored; LE only     Knee/Hip Exercises: Standing   Heel Raises --  2x10   Knee Flexion AROM;Both;2 sets;10 reps  #3   Hip Flexion AROM;Both;20 reps  Marching on blue foam mat   Hip Abduction AROM;Both;2 sets;10 reps  #3   Hip Extension AROM;Both;2 sets;10 reps  #3   Forward Step Up Both  Alternating toe taps x20   Gait Training --   Other Standing Knee Exercises weight shifting side to side and front to back     Knee/Hip Exercises: Seated   Long Arc Quad Strengthening;Both;10 reps   Long Arc Quad Weight 3 lbs.   Ball Squeeze 25x  With LAQ   Clamshell with TheraBand --  Green 2x10, TC for hip symmetry and coordination   Sit to Sand 1 set;5 reps  Seated on blue mat for extra clearance                  PT Short Term Goals - 12/27/16 0930      PT SHORT  TERM GOAL #3   Title improve LE strength to perform sit to stand with moderate UE support   Time 4   Period Weeks   Status On-going     PT SHORT TERM GOAL #4   Title perform TUG in < or = to 20 seconds to reduce falls risk   Time 4   Period Weeks   Status On-going           PT Long Term Goals - 12/27/16 0931      PT LONG TERM GOAL #1   Title be independent in advanced HEP   Time 8   Period Weeks   Status On-going               Plan - 12/27/16 1010    Clinical Impression Statement Patient did well with all exercises today. Continues to lock knees while standing for stability and difficulty to correct even with verbal and tactile cues. Patient reports his doctor wants him to go to the gym regularly after physical therapy so therapist introduced cable machines and stationary bicycle for strengthening and to allow patient to become familiar with equipment for gym. Patient will continue to benefit from skilled therapy  for LE and core strengthening.    Rehab Potential Good   PT Frequency 2x / week   PT Duration 8 weeks   PT Treatment/Interventions ADLs/Self Care Home Management;Cryotherapy;Functional mobility training;Stair training;Gait training;Moist Heat;Therapeutic activities;Therapeutic exercise;Balance training;Neuromuscular re-education;Patient/family education;Passive range of motion;Manual techniques   PT Next Visit Plan Balance, enduranance, LE/UE strength   Consulted and Agree with Plan of Care Patient      Patient will benefit from skilled therapeutic intervention in order to improve the following deficits and impairments:  Abnormal gait, Decreased coordination, Difficulty walking, Postural dysfunction, Decreased strength, Decreased mobility, Decreased balance, Decreased activity tolerance  Visit Diagnosis: Other abnormalities of gait and mobility  Muscle weakness (generalized)  Abnormal posture     Problem List Patient Active Problem List   Diagnosis Date Noted  . Left hemiparesis (HCC) 03/19/2013  . Cerebral infarction (HCC) 03/19/2013  . Left leg weakness 03/19/2013  . CAD, ARTERY BYPASS GRAFT 09/30/2008  . 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 Borderline hyperglycemia 12/03/2007  . Other and unspecified hyperlipidemia 07/28/2007  . Essential hypertension 07/28/2007  . 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 Coronary atherosclerosis 07/28/2007  . BENIGN PROSTATIC HYPERTROPHY 07/28/2007  . COLONIC POLYPS, 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    Tyrone Apple PTA 12/27/2016, 12:47 PM  [facility] [address], [address] [place], [place], [address] Phone: [phone]   Fax:  [phone]  Name: Thomas [name]. MRN: [id number] Date of Birth: 1941/04/22

## 2017-01-01 ENCOUNTER — Encounter: Payer: Self-pay | Admitting: Physical Therapy

## 2017-01-01 ENCOUNTER — Ambulatory Visit: Payer: Medicare Other | Admitting: Physical Therapy

## 2017-01-01 DIAGNOSIS — M6281 Muscle weakness (generalized): Secondary | ICD-10-CM

## 2017-01-01 DIAGNOSIS — R293 Abnormal posture: Secondary | ICD-10-CM

## 2017-01-01 DIAGNOSIS — R2689 Other abnormalities of gait and mobility: Secondary | ICD-10-CM

## 2017-01-01 NOTE — Therapy (Signed)
[facility] [address] [place], [place], [address] Phone: [phone]   Fax:  [phone]  Physical Therapy Treatment  Patient Details  Name: Thomas [name]. MRN: [id number] Date of Birth: Jul 27, 1941 Referring Provider: Shirline Frees, PA  Encounter Date: 01/01/2017      PT End of Session - 01/01/17 0931    Visit Number 7   Number of Visits 10   Date for PT Re-Evaluation 02/05/17   PT Start Time 0930   PT Stop Time 1009   PT Time Calculation (min) 39 min   Activity Tolerance Patient tolerated treatment well   Behavior During Therapy [facility] for tasks assessed/performed      Past Medical History:  Diagnosis Date  . CHF (congestive heart failure) (HCC) 1999  . Coronary artery disease   . Hx of colonic polyps   . Hyperlipidemia   . Hyperplastic colon polyp 04/15/2007  . Hypertension   . Prostatic hypertrophy, benign    with elevated PSA  . TIA (transient ischemic attack) 2003   "mini stroke" (03/19/2013)    Past Surgical History:  Procedure Laterality Date  . CARDIAC CATHETERIZATION    . CATARACT EXTRACTION W/ INTRAOCULAR LENS  IMPLANT, BILATERAL Bilateral 1998  . COLONOSCOPY  2008  . CORONARY ARTERY BYPASS GRAFT  1999   x4    There were no vitals filed for this visit.      Subjective Assessment - 01/01/17 0931    Subjective Patient reports feeling that Lt leg is getting stronger. No pain today.    Pertinent History 2 falls recently.     How long can you stand comfortably? 5 minutes    How long can you walk comfortably? 20 minutes with arm support/walker   Patient Stated Goals improve balance, endurance, reduce falls   Currently in Pain? No/denies   Multiple Pain Sites No                         [facility] Adult PT Treatment/Exercise - 01/01/17 0001      Therapeutic Activites    Therapeutic Activities --  Walking, sit to stand, standing balance     Lumbar Exercises: Standing   Row Strengthening;Power tower  #20; CGA   Shoulder Extension Strengthening;Power Tower  #20; CGA     Knee/Hip Exercises: Aerobic   Nustep L2 10 minutes  seat 12 LE only; Therapist present     Knee/Hip Exercises: Standing   Heel Raises --  2x10   Knee Flexion AROM;Both;2 sets;10 reps  #3 Hamstring curls   Hip Flexion AROM;Both;20 reps  Marching on blue foam mat   Hip Abduction AROM;Both;2 sets;10 reps  #3   Hip Extension AROM;Both;2 sets;10 reps  #3   Forward Step Up Both;Step Height: 6"  Alternating toe taps x20   Other Standing Knee Exercises Mini squats x10     Knee/Hip Exercises: Seated   Long Arc Quad Strengthening;Both;10 reps   Long Arc Quad Weight 3 lbs.   Ball Squeeze 25x  With LAQ   Hamstring Curl Strengthening;Both;1 set;15 reps  green band   Sit to [employer] --                  PT Short Term Goals - 01/01/17 0931      PT SHORT TERM GOAL #3   Title improve LE strength to perform sit to stand with moderate UE support   Baseline Continues to use Max UE  Time 4   Period Weeks   Status On-going     PT SHORT TERM GOAL #4   Title perform TUG in < or = to 20 seconds to reduce falls risk   Time 4   Period Weeks   Status On-going           PT Long Term Goals - 12/27/16 0931      PT LONG TERM GOAL #1   Title be independent in advanced HEP   Time 8   Period Weeks   Status On-going               Plan - 01/01/17 1010    Clinical Impression Statement Patient able to tolerate all exercises well with minimal fatigue. Patient continues to lock Bil knees into extension to compensate for weakness. Did well with mini squats and able to use more LE rather than UE. Patient will continue to benefit from skilled thearpy for postural strengthening, core and LE strenghtening.    Rehab Potential Good   PT Frequency 2x / week   PT Duration 8 weeks   PT Treatment/Interventions ADLs/Self Care Home Management;Cryotherapy;Functional mobility training;Stair  training;Gait training;Moist Heat;Therapeutic activities;Therapeutic exercise;Balance training;Neuromuscular re-education;Patient/family education;Passive range of motion;Manual techniques   PT Next Visit Plan Balance, enduranance, LE/UE strength   Consulted and Agree with Plan of Care Patient      Patient will benefit from skilled therapeutic intervention in order to improve the following deficits and impairments:  Abnormal gait, Decreased coordination, Difficulty walking, Postural dysfunction, Decreased strength, Decreased mobility, Decreased balance, Decreased activity tolerance  Visit Diagnosis: Other abnormalities of gait and mobility  Muscle weakness (generalized)  Abnormal posture     Problem List Patient Active Problem List   Diagnosis Date Noted  . Left hemiparesis (HCC) 03/19/2013  . Cerebral infarction (HCC) 03/19/2013  . Left leg weakness 03/19/2013  . CAD, ARTERY BYPASS GRAFT 09/30/2008  . 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 Borderline hyperglycemia 12/03/2007  . Other and unspecified hyperlipidemia 07/28/2007  . Essential hypertension 07/28/2007  . 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 Coronary atherosclerosis 07/28/2007  . BENIGN PROSTATIC HYPERTROPHY 07/28/2007  . COLONIC POLYPS, 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    Tyrone Apple PTA 01/01/2017, 10:13 AM  [facility] [address] [place], [place], [address] Phone: [phone]   Fax:  [phone]  Name: Thomas [name]. MRN: [id number] Date of Birth: 1941-07-31

## 2017-01-03 ENCOUNTER — Ambulatory Visit: Payer: Medicare Other

## 2017-01-03 DIAGNOSIS — M6281 Muscle weakness (generalized): Secondary | ICD-10-CM

## 2017-01-03 DIAGNOSIS — R293 Abnormal posture: Secondary | ICD-10-CM

## 2017-01-03 DIAGNOSIS — R2689 Other abnormalities of gait and mobility: Secondary | ICD-10-CM | POA: Diagnosis not present

## 2017-01-03 NOTE — Therapy (Signed)
[facility] [address], [address] [place], [place], [address] Phone: [phone]   Fax:  [phone]  Physical Therapy Treatment  Patient Details  Name: Thomas [name]. MRN: [id number] Date of Birth: [birth year] Referring Provider: Shirline Frees, PA  Encounter Date: 01/03/2017      PT End of Session - 01/03/17 1001    Visit Number 8   Number of Visits 10   Date for PT Re-Evaluation 02/05/17   PT Start Time 0928   PT Stop Time 1006   PT Time Calculation (min) 38 min   Activity Tolerance Patient tolerated treatment well   Behavior During Therapy [facility] for tasks assessed/performed      Past Medical History:  Diagnosis Date  . CHF (congestive heart failure) (HCC) 1999  . Coronary artery disease   . Hx of colonic polyps   . Hyperlipidemia   . Hyperplastic colon polyp 04/15/2007  . Hypertension   . Prostatic hypertrophy, benign    with elevated PSA  . TIA (transient ischemic attack) 2003   "mini stroke" (03/19/2013)    Past Surgical History:  Procedure Laterality Date  . CARDIAC CATHETERIZATION    . CATARACT EXTRACTION W/ INTRAOCULAR LENS  IMPLANT, BILATERAL Bilateral 1998  . COLONOSCOPY  2008  . CORONARY ARTERY BYPASS GRAFT  1999   x4    There were no vitals filed for this visit.          [facility] PT Assessment - 01/03/17 0001      Timed Up and Go Test   TUG Normal TUG   Normal TUG (seconds) 26   TUG Comments with walker, high falls risk                     [facility] Adult PT Treatment/Exercise - 01/03/17 0001      Therapeutic Activites    Therapeutic Activities --  Walking, sit to stand, standing balance     Exercises   Exercises Shoulder     Lumbar Exercises: Aerobic   UBE (Upper Arm Bike) Level 0 x 5 minutes     Knee/Hip Exercises: Aerobic   Nustep L2 10 minutes  arms and legs     Knee/Hip Exercises: Standing   Heel Raises --  2x10   Knee Flexion AROM;Both;2 sets;10 reps  3#   Hamstring curls   Hip Flexion AROM;Both;20 reps  Marching on blue foam mat   Hip Abduction AROM;Both;2 sets;10 reps  3#   Hip Extension AROM;Both;2 sets;10 reps  3#   Forward Step Up Both;Step Height: 6"  Alternating toe taps x20   Other Standing Knee Exercises Mini squats x10     Knee/Hip Exercises: Seated   Long Arc Quad Strengthening;Both;10 reps   Long Arc Quad Weight 3 lbs.   Ball Squeeze 25x  With [employer] Strengthening;Both;2 sets;10 reps   Marching Limitations 3#     Shoulder Exercises: Seated   Flexion Strengthening;Both;20 reps;Weights   Flexion Weight (lbs) 1                  PT Short Term Goals - 01/03/17 0931      PT SHORT TERM GOAL #2   Title verbalize and demonstrate understanding of fall prevention strategies   Status Achieved     PT SHORT TERM GOAL #3   Title improve LE strength to perform sit to stand with moderate UE support   Baseline Continues to use Max UE   Time  4   Period Weeks   Status On-going     PT SHORT TERM GOAL #4   Title perform TUG in < or = to 20 seconds to reduce falls risk   Baseline 26 seconds   Time 4   Period Weeks   Status On-going           PT Long Term Goals - 12/27/16 0931      PT LONG TERM GOAL #1   Title be independent in advanced HEP   Time 8   Period Weeks   Status On-going               Plan - 01/03/17 0946    Clinical Impression Statement Pt requires frequent instruction and verbal cues for techinque with exercises today.  Pt continues to use max UE support with sit to stand due to lower extremity weakness.  Pt tolerated all exercise in the clinic well today.  Pt will continue to benefit from skilled PT for strength and endurance progression to improve safety at home and in the community.     Rehab Potential Good   PT Frequency 2x / week   PT Duration 8 weeks   PT Treatment/Interventions ADLs/Self Care Home Management;Cryotherapy;Functional mobility training;Stair training;Gait  training;Moist Heat;Therapeutic activities;Therapeutic exercise;Balance training;Neuromuscular re-education;Patient/family education;Passive range of motion;Manual techniques   PT Next Visit Plan Balance, enduranance, LE/UE strength   Consulted and Agree with Plan of Care Patient      Patient will benefit from skilled therapeutic intervention in order to improve the following deficits and impairments:  Abnormal gait, Decreased coordination, Difficulty walking, Postural dysfunction, Decreased strength, Decreased mobility, Decreased balance, Decreased activity tolerance  Visit Diagnosis: Other abnormalities of gait and mobility  Muscle weakness (generalized)  Abnormal posture     Problem List Patient Active Problem List   Diagnosis Date Noted  . Left hemiparesis (HCC) 03/19/2013  . Cerebral infarction (HCC) 03/19/2013  . Left leg weakness 03/19/2013  . CAD, ARTERY BYPASS GRAFT 09/30/2008  . 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 Borderline hyperglycemia 12/03/2007  . Other and unspecified hyperlipidemia 07/28/2007  . Essential hypertension 07/28/2007  . 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 Coronary atherosclerosis 07/28/2007  . BENIGN PROSTATIC HYPERTROPHY 07/28/2007  . COLONIC POLYPS, 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     Lorrene Reid, PT 01/03/17 10:02 AM  [facility] [address], [address] [place], [place], [address] Phone: [phone]   Fax:  [phone]  Name: Thomas [name]. MRN: [id number] Date of Birth: 12-30-1940

## 2017-01-08 ENCOUNTER — Encounter: Payer: Self-pay | Admitting: Physical Therapy

## 2017-01-08 ENCOUNTER — Ambulatory Visit: Payer: Medicare Other | Admitting: Physical Therapy

## 2017-01-08 DIAGNOSIS — R293 Abnormal posture: Secondary | ICD-10-CM

## 2017-01-08 DIAGNOSIS — R2689 Other abnormalities of gait and mobility: Secondary | ICD-10-CM

## 2017-01-08 DIAGNOSIS — M6281 Muscle weakness (generalized): Secondary | ICD-10-CM

## 2017-01-08 NOTE — Therapy (Signed)
[facility] [address] [place], [place], [address] Phone: [phone]   Fax:  [phone]  Physical Therapy Treatment  Patient Details  Name: Thomas [name]. MRN: [id number] Date of Birth: 11-01-1940 Referring Provider: Shirline Frees, PA  Encounter Date: 01/08/2017      PT End of Session - 01/08/17 0933    Visit Number 9   Number of Visits 10   Date for PT Re-Evaluation 02/05/17   PT Start Time 0928   PT Stop Time 1006   PT Time Calculation (min) 38 min   Activity Tolerance Patient tolerated treatment well   Behavior During Therapy [facility] for tasks assessed/performed      Past Medical History:  Diagnosis Date  . CHF (congestive heart failure) (HCC) 1999  . Coronary artery disease   . Hx of colonic polyps   . Hyperlipidemia   . Hyperplastic colon polyp 04/15/2007  . Hypertension   . Prostatic hypertrophy, benign    with elevated PSA  . TIA (transient ischemic attack) 2003   "mini stroke" (03/19/2013)    Past Surgical History:  Procedure Laterality Date  . CARDIAC CATHETERIZATION    . CATARACT EXTRACTION W/ INTRAOCULAR LENS  IMPLANT, BILATERAL Bilateral 1998  . COLONOSCOPY  2008  . CORONARY ARTERY BYPASS GRAFT  1999   x4    There were no vitals filed for this visit.      Subjective Assessment - 01/08/17 0932    Subjective Legs are feeling good today.    Pertinent History 2 falls recently.     How long can you stand comfortably? 5 minutes    How long can you walk comfortably? 20 minutes with arm support/walker   Patient Stated Goals improve balance, endurance, reduce falls   Currently in Pain? No/denies   Multiple Pain Sites No                         [facility] Adult PT Treatment/Exercise - 01/08/17 0001      Therapeutic Activites    Therapeutic Activities --  Walking, sit to stand, standing balance     Neuro Re-ed    Neuro Re-ed Details  Hmastring and quad activation,  varied BOS and stance     Lumbar Exercises: Aerobic   UBE (Upper Arm Bike) --     Lumbar Exercises: Standing   Row Strengthening;Power tower  #20; CGA   Shoulder Extension Strengthening;Power Tower  #20; CGA     Knee/Hip Exercises: Aerobic   Nustep L3 10 minutes  arms and legs     Knee/Hip Exercises: Standing   Knee Flexion AROM;Both;2 sets;10 reps  3#  Hamstring curls   Hip Flexion AROM;Both;20 reps  Marching on blue foam mat   Hip Abduction AROM;Both;2 sets;10 reps  3#   Hip Extension AROM;Both;2 sets;10 reps  3#   Forward Step Up Both;Step Height: 6"  Alternating toe taps x20   Other Standing Knee Exercises Mini squats x10     Knee/Hip Exercises: Seated   Long Arc Quad Strengthening;Both;10 reps   Long Arc Quad Weight 3 lbs.   Ball Squeeze 25x  With LAQ     Shoulder Exercises: Seated   Horizontal ABduction Strengthening;Both;20 reps;Theraband   Theraband Level (Shoulder Horizontal ABduction) Level 2 (Red)   Flexion Strengthening;Both;20 reps;Weights   Theraband Level (Shoulder Flexion) Level 2 (Red)  PT Short Term Goals - 01/08/17 0569      PT SHORT TERM GOAL #3   Title improve LE strength to perform sit to stand with moderate UE support   Baseline Continues to use Max UE   Time 4   Period Weeks   Status On-going     PT SHORT TERM GOAL #4   Title perform TUG in < or = to 20 seconds to reduce falls risk   Baseline 26 seconds   Time 4   Period Weeks   Status On-going           PT Long Term Goals - 01/08/17 7948      PT LONG TERM GOAL #2   Title perform TUG in < or = to 18 seconds to reduce falls risk   Time 8   Period Weeks   Status On-going               Plan - 01/08/17 1007    Clinical Impression Statement Patient reports feeling good today. Did well with all exercises with improved endurance needing fewer and shorter rest breaks. Patient continues to have hyper extension in Bil knees with standing due to  weakness and needs verbal cues to stand up straight with good posture dring exercises. Patient will conitnue to benefit from skilled therapy for strengthening and balance.    Rehab Potential Good   PT Frequency 2x / week   PT Duration 8 weeks   PT Next Visit Plan Progress balance and strength   Consulted and Agree with Plan of Care Patient      Patient will benefit from skilled therapeutic intervention in order to improve the following deficits and impairments:  Abnormal gait, Decreased coordination, Difficulty walking, Postural dysfunction, Decreased strength, Decreased mobility, Decreased balance, Decreased activity tolerance  Visit Diagnosis: Other abnormalities of gait and mobility  Muscle weakness (generalized)  Abnormal posture     Problem List Patient Active Problem List   Diagnosis Date Noted  . Left hemiparesis (HCC) 03/19/2013  . Cerebral infarction (HCC) 03/19/2013  . Left leg weakness 03/19/2013  . CAD, ARTERY BYPASS GRAFT 09/30/2008  . 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 Borderline hyperglycemia 12/03/2007  . Other and unspecified hyperlipidemia 07/28/2007  . Essential hypertension 07/28/2007  . 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 Coronary atherosclerosis 07/28/2007  . BENIGN PROSTATIC HYPERTROPHY 07/28/2007  . COLONIC POLYPS, 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    Tyrone Apple PTA 01/08/2017, 12:46 PM  [facility] [address] [place], [place], [address] Phone: [phone]   Fax:  [phone]  Name: Chief Perreault. MRN: [id number] Date of Birth: 03-28-1941

## 2017-01-10 ENCOUNTER — Ambulatory Visit: Payer: Medicare Other

## 2017-01-10 DIAGNOSIS — R2689 Other abnormalities of gait and mobility: Secondary | ICD-10-CM

## 2017-01-10 DIAGNOSIS — M6281 Muscle weakness (generalized): Secondary | ICD-10-CM

## 2017-01-10 DIAGNOSIS — R293 Abnormal posture: Secondary | ICD-10-CM

## 2017-01-10 NOTE — Therapy (Signed)
[facility] [address], [address] [place], [place], [address] Phone: [phone]   Fax:  [phone]  Physical Therapy Treatment  Patient Details  Name: Thomas [name]. MRN: [id number] Date of Birth: 21-May-1941 Referring Provider: Shirline Frees, PA  Encounter Date: 01/10/2017      PT End of Session - 01/10/17 1007    Visit Number 10   Number of Visits 20   Date for PT Re-Evaluation 02/05/17   PT Start Time 0930   PT Stop Time 1012   PT Time Calculation (min) 42 min   Activity Tolerance Patient tolerated treatment well   Behavior During Therapy [facility] for tasks assessed/performed      Past Medical History:  Diagnosis Date  . CHF (congestive heart failure) (HCC) 1999  . Coronary artery disease   . Hx of colonic polyps   . Hyperlipidemia   . Hyperplastic colon polyp 04/15/2007  . Hypertension   . Prostatic hypertrophy, benign    with elevated PSA  . TIA (transient ischemic attack) 2003   "mini stroke" (03/19/2013)    Past Surgical History:  Procedure Laterality Date  . CARDIAC CATHETERIZATION    . CATARACT EXTRACTION W/ INTRAOCULAR LENS  IMPLANT, BILATERAL Bilateral 1998  . COLONOSCOPY  2008  . CORONARY ARTERY BYPASS GRAFT  1999   x4    There were no vitals filed for this visit.      Subjective Assessment - 01/10/17 0931    Subjective I am doing my exercises at home.     Currently in Pain? No/denies            [facility] PT Assessment - 01/10/17 0001      Timed Up and Go Test   TUG Normal TUG   Normal TUG (seconds) 27   TUG Comments with walker, high falls risk                     [facility] Adult PT Treatment/Exercise - 01/10/17 0001      Lumbar Exercises: Aerobic   UBE (Upper Arm Bike) Level 0 x 5 minutes     Lumbar Exercises: Standing   Row --   Shoulder Extension --     Knee/Hip Exercises: Aerobic   Nustep L3 10 minutes  arms and legs     Knee/Hip Exercises: Standing   Knee  Flexion AROM;Both;2 sets;10 reps  3#  Hamstring curls   Hip Flexion AROM;Both;20 reps  Marching on blue foam mat   Hip Abduction AROM;Both;2 sets;10 reps  3#   Hip Extension AROM;Both;2 sets;10 reps  3#   Forward Step Up Both;Step Height: 6"  Alternating toe taps x20   Other Standing Knee Exercises Mini squats x10     Knee/Hip Exercises: Seated   Long Arc Quad Strengthening;Both;10 reps   Long Arc Quad Weight 3 lbs.   Ball Squeeze 25x  With [employer] Strengthening;Both;2 sets;10 reps   Marching Limitations 3#     Shoulder Exercises: Seated   Horizontal ABduction Strengthening;Both;20 reps;Theraband   Theraband Level (Shoulder Horizontal ABduction) Level 2 (Red)   Flexion Strengthening;Both;20 reps   Flexion Weight (lbs) 1                  PT Short Term Goals - 01/10/17 0931      PT SHORT TERM GOAL #3   Title improve LE strength to perform sit to stand with moderate UE support   Baseline Continues to use Max  UE   Period Weeks   Status On-going           PT Long Term Goals - 01/08/17 1610      PT LONG TERM GOAL #2   Title perform TUG in < or = to 18 seconds to reduce falls risk   Time 8   Period Weeks   Status On-going               Plan - 01-26-2017 0945    Clinical Impression Statement TUG is 27 seconds today with instability with turning while using walker.  Pt with knee hyperextension with standing and gait activities.  Pt demonstrates instability, endurance deficits and gait abnormality of a chronic nature.  Pt will continue to benefit from skilled PT for strength, posture training and balance.     Rehab Potential Good   PT Frequency 2x / week   PT Duration 8 weeks   PT Treatment/Interventions ADLs/Self Care Home Management;Cryotherapy;Functional mobility training;Stair training;Gait training;Moist Heat;Therapeutic activities;Therapeutic exercise;Balance training;Neuromuscular re-education;Patient/family education;Passive range of  motion;Manual techniques   PT Next Visit Plan Progress balance and strength, posture and UE endurance   Consulted and Agree with Plan of Care Patient      Patient will benefit from skilled therapeutic intervention in order to improve the following deficits and impairments:  Abnormal gait, Decreased coordination, Difficulty walking, Postural dysfunction, Decreased strength, Decreased mobility, Decreased balance, Decreased activity tolerance  Visit Diagnosis: Other abnormalities of gait and mobility  Muscle weakness (generalized)  Abnormal posture       G-Codes - 2017-01-26 0944    Functional Assessment Tool Used (Outpatient Only) TUG: 27 seconds   Functional Limitation Mobility: Walking and moving around   Mobility: Walking and Moving Around Current Status [phone]) At least 60 percent but less than 80 percent impaired, limited or restricted   Mobility: Walking and Moving Around Goal Status [phone]) At least 40 percent but less than 60 percent impaired, limited or restricted      Problem List Patient Active Problem List   Diagnosis Date Noted  . Left hemiparesis (HCC) 03/19/2013  . Cerebral infarction (HCC) 03/19/2013  . Left leg weakness 03/19/2013  . CAD, ARTERY BYPASS GRAFT 09/30/2008  . 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 Borderline hyperglycemia 12/03/2007  . Other and unspecified hyperlipidemia 07/28/2007  . Essential hypertension 07/28/2007  . 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 Coronary atherosclerosis 07/28/2007  . BENIGN PROSTATIC HYPERTROPHY 07/28/2007  . COLONIC POLYPS, 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     Lorrene Reid, PT 01/26/2017 10:10 AM  [facility] [address] [place], [place], [address] Phone: [phone]   Fax:  [phone]  Name: Thomas [name]. MRN: [id number] Date of Birth: March 11, 1941

## 2017-01-17 ENCOUNTER — Ambulatory Visit: Payer: Medicare Other | Attending: Adult Health

## 2017-01-17 DIAGNOSIS — R293 Abnormal posture: Secondary | ICD-10-CM | POA: Insufficient documentation

## 2017-01-17 DIAGNOSIS — M6281 Muscle weakness (generalized): Secondary | ICD-10-CM | POA: Diagnosis present

## 2017-01-17 DIAGNOSIS — R2689 Other abnormalities of gait and mobility: Secondary | ICD-10-CM | POA: Insufficient documentation

## 2017-01-17 NOTE — Therapy (Signed)
[facility] [address], [address] [place], [place], [address] Phone: [phone]   Fax:  [phone]  Physical Therapy Treatment  Patient Details  Name: Thomas [name]. MRN: [id number] Date of Birth: 09/06/1940 Referring Provider: Shirline Frees, PA  Encounter Date: 01/17/2017      PT End of Session - 01/17/17 1006    Visit Number 11   Number of Visits 20   Date for PT Re-Evaluation 02/05/17   PT Start Time 0929   PT Stop Time 1010   PT Time Calculation (min) 41 min   Activity Tolerance Patient tolerated treatment well   Behavior During Therapy [facility] for tasks assessed/performed      Past Medical History:  Diagnosis Date  . CHF (congestive heart failure) (HCC) 1999  . Coronary artery disease   . Hx of colonic polyps   . Hyperlipidemia   . Hyperplastic colon polyp 04/15/2007  . Hypertension   . Prostatic hypertrophy, benign    with elevated PSA  . TIA (transient ischemic attack) 2003   "mini stroke" (03/19/2013)    Past Surgical History:  Procedure Laterality Date  . CARDIAC CATHETERIZATION    . CATARACT EXTRACTION W/ INTRAOCULAR LENS  IMPLANT, BILATERAL Bilateral 1998  . COLONOSCOPY  2008  . CORONARY ARTERY BYPASS GRAFT  1999   x4    There were no vitals filed for this visit.      Subjective Assessment - 01/17/17 0939    Subjective I have ankle weights at home and they feel about 2x the weight that i use in here.     Currently in Pain? No/denies                         [facility] Adult PT Treatment/Exercise - 01/17/17 0001      Lumbar Exercises: Aerobic   UBE (Upper Arm Bike) Level 0 x 5 minutes     Lumbar Exercises: Standing   Shoulder Extension Strengthening;Power Tower;20 reps  20#; CGA by PT     Knee/Hip Exercises: Aerobic   Nustep L3 10 minutes  arms and legs     Knee/Hip Exercises: Standing   Knee Flexion AROM;Both;2 sets;10 reps  3#  Hamstring curls   Hip Flexion  AROM;Both;20 reps  Marching on blue foam mat   Hip Abduction AROM;Both;2 sets;10 reps  3#   Hip Extension AROM;Both;2 sets;10 reps  3#   Forward Step Up --   Other Standing Knee Exercises Mini squats x10     Knee/Hip Exercises: Seated   Long Arc Quad Strengthening;Both;10 reps   Long Arc Quad Weight 3 lbs.   Ball Squeeze 25x  With [employer] Strengthening;Both;2 sets;10 reps   Marching Limitations 3#     Shoulder Exercises: Seated   Flexion Strengthening;Both;20 reps   Flexion Weight (lbs) 1                  PT Short Term Goals - 01/17/17 0940      PT SHORT TERM GOAL #3   Title improve LE strength to perform sit to stand with moderate UE support   Baseline moderate to max   Time 4   Period Weeks   Status On-going           PT Long Term Goals - 01/08/17 6962      PT LONG TERM GOAL #2   Title perform TUG in < or = to 18 seconds to reduce  falls risk   Time 8   Period Weeks   Status On-going               Plan - 01/17/17 0945    Clinical Impression Statement Pt reports that he is doing his exercises at home.  Pt with knee hyperextension and standing and gait activities.  Pt with uncontrolled descent with stand to sit transition.  Pt with less UE support required with sit to stand at times although this isn't consistent.  Pt will benefit from continued PT for strength, postura training and balance.     Rehab Potential Good   PT Frequency 2x / week   PT Duration 8 weeks   PT Treatment/Interventions ADLs/Self Care Home Management;Cryotherapy;Functional mobility training;Stair training;Gait training;Moist Heat;Therapeutic activities;Therapeutic exercise;Balance training;Neuromuscular re-education;Patient/family education;Passive range of motion;Manual techniques   PT Next Visit Plan Progress balance and strength, posture and UE endurance   Recommended Other Services initial certification is signed   Consulted and Agree with Plan of Care Patient       Patient will benefit from skilled therapeutic intervention in order to improve the following deficits and impairments:  Abnormal gait, Decreased coordination, Difficulty walking, Postural dysfunction, Decreased strength, Decreased mobility, Decreased balance, Decreased activity tolerance  Visit Diagnosis: Other abnormalities of gait and mobility  Muscle weakness (generalized)  Abnormal posture     Problem List Patient Active Problem List   Diagnosis Date Noted  . Left hemiparesis (HCC) 03/19/2013  . Cerebral infarction (HCC) 03/19/2013  . Left leg weakness 03/19/2013  . CAD, ARTERY BYPASS GRAFT 09/30/2008  . 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 Borderline hyperglycemia 12/03/2007  . Other and unspecified hyperlipidemia 07/28/2007  . Essential hypertension 07/28/2007  . 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 Coronary atherosclerosis 07/28/2007  . BENIGN PROSTATIC HYPERTROPHY 07/28/2007  . COLONIC POLYPS, 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    Lorrene Reid, PT 01/17/17 10:08 AM  [facility] [address] [place], [place], [address] Phone: [phone]   Fax:  [phone]  Name: Thomas [name]. MRN: [id number] Date of Birth: [birth year]

## 2017-01-24 ENCOUNTER — Ambulatory Visit: Payer: Medicare Other

## 2017-01-24 DIAGNOSIS — R2689 Other abnormalities of gait and mobility: Secondary | ICD-10-CM

## 2017-01-24 DIAGNOSIS — M6281 Muscle weakness (generalized): Secondary | ICD-10-CM

## 2017-01-24 DIAGNOSIS — R293 Abnormal posture: Secondary | ICD-10-CM

## 2017-01-24 NOTE — Therapy (Signed)
[facility] [address] [place], [place], [address] Phone: [phone]   Fax:  [phone]  Physical Therapy Treatment  Patient Details  Name: Thomas [name]. MRN: [id number] Date of Birth: [birth year] Referring Provider: Shirline Frees, PA  Encounter Date: 01/24/2017      PT End of Session - 01/24/17 0951    Visit Number 12   Number of Visits 20   Date for PT Re-Evaluation 02/05/17   PT Start Time 0932   PT Stop Time 1017   PT Time Calculation (min) 45 min   Activity Tolerance Patient tolerated treatment well   Behavior During Therapy [facility] for tasks assessed/performed      Past Medical History:  Diagnosis Date  . CHF (congestive heart failure) (HCC) 1999  . Coronary artery disease   . Hx of colonic polyps   . Hyperlipidemia   . Hyperplastic colon polyp 04/15/2007  . Hypertension   . Prostatic hypertrophy, benign    with elevated PSA  . TIA (transient ischemic attack) 2003   "mini stroke" (03/19/2013)    Past Surgical History:  Procedure Laterality Date  . CARDIAC CATHETERIZATION    . CATARACT EXTRACTION W/ INTRAOCULAR LENS  IMPLANT, BILATERAL Bilateral 1998  . COLONOSCOPY  2008  . CORONARY ARTERY BYPASS GRAFT  1999   x4    There were no vitals filed for this visit.      Subjective Assessment - 01/24/17 1134    Subjective Pt. doing well and reports no recent falls.    Patient Stated Goals improve balance, endurance, reduce falls   Currently in Pain? No/denies   Multiple Pain Sites No                         [facility] Adult PT Treatment/Exercise - 01/24/17 1135      Self-Care   Self-Care Other Self-Care Comments   Other Self-Care Comments  Discussion of need for proper RW spacing with transfers, proper hand placement with sit>stand, and spacing of BOS from sitting surface with controlled descent     Neuro Re-ed    Neuro Re-ed Details  Alternating step onto airex pad with  reach to white bolster x 10 each side; HH support from therapist; At counter: side stepping with B UE support x 3 laps      Lumbar Exercises: Aerobic   Stationary Bike --     Knee/Hip Exercises: Aerobic   Nustep L4, 6 min      Knee/Hip Exercises: Standing   Heel Raises 1 set;15 reps   Heel Raises Limitations at counter; limited control    Hip Flexion AROM;Both;20 reps   Hip Flexion Limitations at counter; 3#    Hip Abduction AROM;Both;1 set;20 reps   Abduction Limitations at counter; 3#    Hip Extension AROM;Both;10 reps;1 set   Extension Limitations at counter; 3#      Knee/Hip Exercises: Seated   Long Arc Quad Strengthening;Both;10 reps;2 sets   [employer] Weight 3 lbs.   Long [employer] Limitations with adduction ball squeeze    Ball Squeeze 5 hold x 15 reps   Marching Strengthening;Both;10 reps;1 Retail [employer] Limitations 3#   Hamstring Curl Strengthening;1 set;15 reps;Right;Left   Hamstring Limitations green TB    Sit to Sand 1 set;10 reps  with B shoulder horizontal abduction and scapular squeeze  PT Short Term Goals - 01/17/17 0940      PT SHORT TERM GOAL #3   Title improve LE strength to perform sit to stand with moderate UE support   Baseline moderate to max   Time 4   Period Weeks   Status On-going           PT Long Term Goals - 01/08/17 1610      PT LONG TERM GOAL #2   Title perform TUG in < or = to 18 seconds to reduce falls risk   Time 8   Period Weeks   Status On-going               Plan - 01/24/17 0951    Clinical Impression Statement Pt. doing well today.  Treatment with hip/knee strengthening, balance activities, and postural retraining.  Pt. tolerated all activities well.  Some uncontrolled descent with sit<>stand, and tendency for poor RW positioning with transfers.  Has a tendency to pull up on RW with sit>stand.  Pt. made aware of safety concern with improper RW positioning with transfers, and need for  controlled descent on sit<>stand and would benefit from further cueing with this.     PT Treatment/Interventions ADLs/Self Care Home Management;Cryotherapy;Functional mobility training;Stair training;Gait training;Moist Heat;Therapeutic activities;Therapeutic exercise;Balance training;Neuromuscular re-education;Patient/family education;Passive range of motion;Manual techniques   PT Next Visit Plan Progress balance and strength, posture and UE endurance      Patient will benefit from skilled therapeutic intervention in order to improve the following deficits and impairments:  Abnormal gait, Decreased coordination, Difficulty walking, Postural dysfunction, Decreased strength, Decreased mobility, Decreased balance, Decreased activity tolerance  Visit Diagnosis: Other abnormalities of gait and mobility  Muscle weakness (generalized)  Abnormal posture     Problem List Patient Active Problem List   Diagnosis Date Noted  . Left hemiparesis (HCC) 03/19/2013  . Cerebral infarction (HCC) 03/19/2013  . Left leg weakness 03/19/2013  . CAD, ARTERY BYPASS GRAFT 09/30/2008  . 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 Borderline hyperglycemia 12/03/2007  . Other and unspecified hyperlipidemia 07/28/2007  . Essential hypertension 07/28/2007  . 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 Coronary atherosclerosis 07/28/2007  . BENIGN PROSTATIC HYPERTROPHY 07/28/2007  . COLONIC POLYPS, 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    Kermit Balo, PTA 01/24/17 11:49 AM  [facility] [address] [place], [place], [address] Phone: [phone]   Fax:  [phone]  Name: Thomas [name]. MRN: [id number] Date of Birth: 11-07-1940

## 2017-01-28 ENCOUNTER — Other Ambulatory Visit: Payer: Self-pay | Admitting: Cardiovascular Disease

## 2017-01-31 ENCOUNTER — Ambulatory Visit: Payer: Medicare Other

## 2017-01-31 DIAGNOSIS — R2689 Other abnormalities of gait and mobility: Secondary | ICD-10-CM

## 2017-01-31 DIAGNOSIS — M6281 Muscle weakness (generalized): Secondary | ICD-10-CM

## 2017-01-31 DIAGNOSIS — R293 Abnormal posture: Secondary | ICD-10-CM

## 2017-01-31 NOTE — Therapy (Signed)
[facility] [address] [place], [place], [address] Phone: [phone]   Fax:  [phone]  Physical Therapy Treatment  Patient Details  Name: Thomas [name]. MRN: [id number] Date of Birth: [birth year] Referring Provider: Shirline Frees, PA  Encounter Date: 01/31/2017      PT End of Session - 01/31/17 1231    Visit Number 13   Number of Visits 20   Date for PT Re-Evaluation 03/08/17   PT Start Time 1144   PT Stop Time 1229   PT Time Calculation (min) 45 min   Activity Tolerance Patient tolerated treatment well   Behavior During Therapy [facility] for tasks assessed/performed      Past Medical History:  Diagnosis Date  . CHF (congestive heart failure) (HCC) 1999  . Coronary artery disease   . Hx of colonic polyps   . Hyperlipidemia   . Hyperplastic colon polyp 04/15/2007  . Hypertension   . Prostatic hypertrophy, benign    with elevated PSA  . TIA (transient ischemic attack) 2003   "mini stroke" (03/19/2013)    Past Surgical History:  Procedure Laterality Date  . CARDIAC CATHETERIZATION    . CATARACT EXTRACTION W/ INTRAOCULAR LENS  IMPLANT, BILATERAL Bilateral 1998  . COLONOSCOPY  2008  . CORONARY ARTERY BYPASS GRAFT  1999   x4    There were no vitals filed for this visit.      Subjective Assessment - 01/31/17 1159    Subjective I am getting stronger.  No falls.  Pt would like to transition to the gym soon.     Currently in Pain? No/denies            [facility] PT Assessment - 01/31/17 0001      Ambulation/Gait   Ambulation/Gait Yes   Ambulation/Gait Assistance 5: Supervision   Gait Pattern Step-through pattern;Decreased stride length;Decreased dorsiflexion - left;Decreased dorsiflexion - right;Trunk flexed;Wide base of support     Timed Up and Go Test   TUG Normal TUG   Normal TUG (seconds) 27   TUG Comments with walker, high falls risk                     [facility] Adult PT  Treatment/Exercise - 01/31/17 0001      Lumbar Exercises: Aerobic   UBE (Upper Arm Bike) Level 0 x 6 minutes (3/3)     Knee/Hip Exercises: Aerobic   Nustep L3 10 minutes  arms and legs     Knee/Hip Exercises: Seated   Long Arc Quad Strengthening;Both;10 reps;2 sets   [employer] Weight 3 lbs.   Long [employer] Limitations with adduction ball squeeze    Ball Squeeze 5 hold x 15 reps   Marching Strengthening;Both;10 reps;1 Retail [employer] Limitations 3#   Hamstring Curl Strengthening;1 set;15 reps;Right;Left   Hamstring Limitations green TB      Shoulder Exercises: Seated   Horizontal ABduction Strengthening;Both;20 reps;Theraband   Theraband Level (Shoulder Horizontal ABduction) Level 2 (Red)                  PT Short Term Goals - 01/31/17 1159      PT SHORT TERM GOAL #1   Title be independent in initial HEP   Status Achieved     PT SHORT TERM GOAL #2   Title verbalize and demonstrate understanding of fall prevention strategies   Status Achieved     PT SHORT TERM GOAL #3   Title improve  LE strength to perform sit to stand with moderate UE support   Baseline moderate to max   Time 4   Period Weeks   Status On-going     PT SHORT TERM GOAL #4   Title perform TUG in < or = to 20 seconds to reduce falls risk           PT Long Term Goals - 01/31/17 1200      PT LONG TERM GOAL #1   Title be independent in advanced HEP   Time 4   Period Weeks   Status On-going     PT LONG TERM GOAL #2   Title perform TUG in < or = to 18 seconds to reduce falls risk   Baseline 27 seconds   Time 4   Period Weeks   Status On-going     PT LONG TERM GOAL #3   Title improve LE strength to perform stand to sit transition with controlled descent   Baseline this is inconsistent ~50% of the time   Time 4   Period Weeks   Status On-going     PT LONG TERM GOAL #4   Title stand for 10 minutes for ADLs without limitation   Baseline 10-15 minutes average- this varies   Time  4               Plan - 01/31/17 1203    Clinical Impression Statement Pt reports that he is feeling stronger and steadier at home and in the community.  Pt with uncontrolled descent with sitting down due to LE weakness. This is improved ~50% of the time.  Pt with varying levels of UE support needed for sit to stand.  Pt requires close supervision and guard with exercises for safety.  Pt with slow progress due to chronic nature of condition. Pt will continue to benefit from skilled PT for strength, endurance and gait to improve safety at home and the community.     Rehab Potential Good   PT Frequency 2x / week   PT Duration 4 weeks   PT Next Visit Plan 1x for 4 weeks to improve balance and LE strength/endurance   Consulted and Agree with Plan of Care Patient      Patient will benefit from skilled therapeutic intervention in order to improve the following deficits and impairments:  Abnormal gait, Decreased coordination, Difficulty walking, Postural dysfunction, Decreased strength, Decreased mobility, Decreased balance, Decreased activity tolerance  Visit Diagnosis: Other abnormalities of gait and mobility - Plan: PT plan of care cert/re-cert  Muscle weakness (generalized) - Plan: PT plan of care cert/re-cert  Abnormal posture - Plan: PT plan of care cert/re-cert     Problem List Patient Active Problem List   Diagnosis Date Noted  . Left hemiparesis (HCC) 03/19/2013  . Cerebral infarction (HCC) 03/19/2013  . Left leg weakness 03/19/2013  . CAD, ARTERY BYPASS GRAFT 09/30/2008  . 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 Borderline hyperglycemia 12/03/2007  . Other and unspecified hyperlipidemia 07/28/2007  . Essential hypertension 07/28/2007  . 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 Coronary atherosclerosis 07/28/2007  . BENIGN PROSTATIC HYPERTROPHY 07/28/2007  . COLONIC POLYPS, 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    Lorrene Reid, PT 01/31/17 12:34 PM  [facility] [address] [place], [place], [address] Phone: [phone]   Fax:  [phone]  Name: Elia Keenum. MRN: [id number] Date of Birth: 1941/06/30

## 2017-02-07 ENCOUNTER — Ambulatory Visit: Payer: Medicare Other

## 2017-02-07 DIAGNOSIS — R2689 Other abnormalities of gait and mobility: Secondary | ICD-10-CM

## 2017-02-07 DIAGNOSIS — M6281 Muscle weakness (generalized): Secondary | ICD-10-CM

## 2017-02-07 DIAGNOSIS — R293 Abnormal posture: Secondary | ICD-10-CM

## 2017-02-07 NOTE — Therapy (Signed)
[facility] [address] [place], [place], [address] Phone: [phone]   Fax:  [phone]  Physical Therapy Treatment  Patient Details  Name: Thomas [name]. MRN: [id number] Date of Birth: 15-Oct-1940 Referring Provider: Dorothyann Peng, PA  Encounter Date: 02/07/2017      PT End of Session - 02/07/17 1023    Visit Number 14   Number of Visits 20   Date for PT Re-Evaluation 03/08/17   PT Start Time 1020   PT Stop Time 1058   PT Time Calculation (min) 38 min   Activity Tolerance Patient tolerated treatment well   Behavior During Therapy [facility] for tasks assessed/performed      Past Medical History:  Diagnosis Date  . CHF (congestive heart failure) ([place]) 1999  . Coronary artery disease   . Hx of colonic polyps   . Hyperlipidemia   . Hyperplastic colon polyp 04/15/2007  . Hypertension   . Prostatic hypertrophy, benign    with elevated PSA  . TIA (transient ischemic attack) 2003   "mini stroke" (03/19/2013)    Past Surgical History:  Procedure Laterality Date  . CARDIAC CATHETERIZATION    . CATARACT EXTRACTION W/ INTRAOCULAR LENS  IMPLANT, BILATERAL Bilateral 1998  . COLONOSCOPY  2008  . CORONARY ARTERY BYPASS GRAFT  1999   x4    There were no vitals filed for this visit.      Subjective Assessment - 02/07/17 1025    Subjective Pt. reporting he would like to be d/c from therapy and make this his last day.  Pt. noting exercises at home are going well.     Patient Stated Goals improve balance, endurance, reduce falls   Currently in Pain? No/denies   Multiple Pain Sites No            [facility] PT Assessment - 02/07/17 1103      Assessment   Medical Diagnosis unsteady gait     Transfers   Transfers Sit to Stand;Stand to Sit   Sit to Stand 6: Modified independent (Device/Increase time)   Transfer Cueing cues for hand placement and to control descent   Comments Heavy cues required for proper hand  placement to avoid pulling up on walker; heavy need for UE pushoff      Ambulation/Gait   Ambulation/Gait Yes   Ambulation/Gait Assistance 5: Supervision   Gait Pattern Step-through pattern;Decreased stride length;Decreased dorsiflexion - left;Decreased dorsiflexion - right;Trunk flexed;Wide base of support     Timed Up and Go Test   TUG Normal TUG   Normal TUG (seconds) 27   TUG Comments with walker, high falls risk                     [facility] Adult PT Treatment/Exercise - 02/07/17 1058      Transfers   Transfers Sit to Stand;Stand to Sit   Sit to Stand 6: Modified independent (Device/Increase time)   Transfer Cueing cues for hand placement    Comments Heavy cues required for proper hand placement to avoid pulling up on walker; heavy need for UE pushoff      Self-Care   Self-Care Other Self-Care Comments   Other Self-Care Comments  Discussion, review, and demonstration of proper hand placement and sequencing with transfers; Discussion and demo of proper use of NuStep, UBE for use at local [employer] 3x/wk; Discussion of current HEP to check for appropriateness of activities.      Lumbar Exercises: Aerobic  UBE (Upper Arm Bike) Level 0 x 6 minutes (3/3)     Knee/Hip Exercises: Aerobic   Nustep L3 10 minutes                  PT Short Term Goals - 2017/02/23 1107      PT SHORT TERM GOAL #1   Title be independent in initial HEP   Status Achieved     PT SHORT TERM GOAL #2   Title verbalize and demonstrate understanding of fall prevention strategies   Status Achieved     PT SHORT TERM GOAL #3   Title improve LE strength to perform sit to stand with moderate UE support   Baseline moderate to max   Time 4   Period Weeks   Status Not Met     PT SHORT TERM GOAL #4   Title perform TUG in < or = to 20 seconds to reduce falls risk   Status Not Met           PT Long Term Goals - 23-Feb-2017 1052      PT LONG TERM GOAL #1   Title be independent in advanced  HEP   Time 4   Period Weeks   Status Achieved     PT LONG TERM GOAL #2   Title perform TUG in < or = to 18 seconds to reduce falls risk   Baseline 27 seconds   Time 4   Period Weeks   Status Not Met     PT LONG TERM GOAL #3   Title improve LE strength to perform stand to sit transition with controlled descent   Baseline this is inconsistent ~50% of the time   Time 4   Period Weeks   Status Not Met     PT LONG TERM GOAL #4   Title stand for 10 minutes for ADLs without limitation   Baseline 10-15 minutes average- this varies   Time 4   Status Achieved               Plan - 02/23/17 1025    Clinical Impression Statement Pt. seen initially today wanting to "finish up" with therapy.  Pt. noting he is getting new dog soon and wants to transition to [facility] gym exercises.  Pt. TUG time at 27 sec.  Despite consistent instruction for proper sequencing and hand placement throughout pt. POC, pt. still demonstrating poor sequencing, hand placement, and control with transfers from RW.  Pt. instructed today on importance for proper walker spacing, hand placement, and sequencing with transfers to avoid future falls.  Pt. verbalized understanding however with limited carryover with duration of treatment.  PT agreeing to d/c today thus pt. d/c from therapy.     PT Treatment/Interventions ADLs/Self Care Home Management;Cryotherapy;Functional mobility training;Stair training;Gait training;Moist Heat;Therapeutic activities;Therapeutic exercise;Balance training;Neuromuscular re-education;Patient/family education;Passive range of motion;Manual techniques   PT Next Visit Plan d/c       Patient will benefit from skilled therapeutic intervention in order to improve the following deficits and impairments:  Abnormal gait, Decreased coordination, Difficulty walking, Postural dysfunction, Decreased strength, Decreased mobility, Decreased balance, Decreased activity tolerance  Visit Diagnosis: Other  abnormalities of gait and mobility  Muscle weakness (generalized)  Abnormal posture       G-Codes - 23-Feb-2017 1104    Functional Assessment Tool Used (Outpatient Only) TUG: 27 seconds   Functional Limitation Mobility: Walking and moving around   Mobility: Walking and Moving Around Goal Status [phone]) At least 40 percent  but less than 60 percent impaired, limited or restricted   Mobility: Walking and Moving Around Discharge Status [phone]) At least 60 percent but less than 80 percent impaired, limited or restricted      Problem List Patient Active Problem List   Diagnosis Date Noted  . Left hemiparesis ([place]) 03/19/2013  . Cerebral infarction ([place]) 03/19/2013  . Left leg weakness 03/19/2013  . CAD, ARTERY BYPASS GRAFT 09/30/2008  . 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 Borderline hyperglycemia 12/03/2007  . Other and unspecified hyperlipidemia 07/28/2007  . Essential hypertension 07/28/2007  . 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 Coronary atherosclerosis 07/28/2007  . BENIGN PROSTATIC HYPERTROPHY 07/28/2007  . COLONIC POLYPS, 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    Bess Harvest, PTA 02/07/17 11:57 AM PHYSICAL THERAPY DISCHARGE SUMMARY  Visits from Start of Care: 14  Current functional level related to goals / functional outcomes: See above for current status.  Pt requested D/C to HEP.    Remaining deficits: Pt with continued strength deficits, gait abnormality and safety concerns with sit to stand and stand to sit transition.  Pt has HEP in place for strength and endurance gains.    Education / Equipment: HEP, fall prevention Plan: Patient agrees to discharge.  Patient goals were partially met. Patient is being discharged due to being pleased with the current functional level.  ?????  Sigurd Sos, PT 02/07/17 12:00 PM        [facility] [address] [place], [place], [address] Phone: [phone]   Fax:   [phone]  Name: Basim Bartnik. MRN: [id number] Date of Birth: 15-Feb-1941

## 2017-02-21 ENCOUNTER — Ambulatory Visit: Payer: Medicare Other

## 2017-03-07 ENCOUNTER — Ambulatory Visit: Payer: Medicare Other

## 2017-03-14 ENCOUNTER — Other Ambulatory Visit: Payer: Self-pay | Admitting: Cardiovascular Disease

## 2017-03-14 DIAGNOSIS — I2510 Atherosclerotic heart disease of native coronary artery without angina pectoris: Secondary | ICD-10-CM

## 2017-03-14 DIAGNOSIS — I2583 Coronary atherosclerosis due to lipid rich plaque: Principal | ICD-10-CM

## 2017-04-06 ENCOUNTER — Other Ambulatory Visit: Payer: Self-pay | Admitting: Cardiovascular Disease

## 2017-04-06 DIAGNOSIS — I2583 Coronary atherosclerosis due to lipid rich plaque: Principal | ICD-10-CM

## 2017-04-06 DIAGNOSIS — I2510 Atherosclerotic heart disease of native coronary artery without angina pectoris: Secondary | ICD-10-CM

## 2017-05-03 ENCOUNTER — Encounter: Payer: Self-pay | Admitting: Adult Health

## 2017-05-06 NOTE — Progress Notes (Signed)
Patient ID: Thomas Alba., male   DOB: [birth year], 76 y.o.   MRN: [id number]   Thomas [name] returns today for followup.he has a distant history of coronary bypass surgery by Dr Tyrone Sage 1999  Last Myoview in August of 09 was nonischemic with an EF of 58%. He is active and not having any significant chest pain. The the patient's PND or orthopnea no lower extremity edema or exertional dyspnea. He's been compliant with his medications.  His most recent lipid profile is within target. Is not having any side effects from the statin drugs. He does tend to bruise easily on aspirin.   Quit smoking 2015     Gait and balance poor since stroke in August of 2014    Dyspnea on visit in March Reviewed f/u echo and EF normal with only mild AS Doing better had 8 weeks PT for better ambulation Using walker Has a new Sn[employer]zer named Kirt Boys that keeps him busy  ROS: Denies fever, malais, weight loss, blurry vision, decreased visual acuity, cough, sputum, SOB, hemoptysis, pleuritic pain, palpitaitons, heartburn, abdominal pain, melena, lower extremity edema, claudication, or rash.  All other systems reviewed and negative  Affect appropriate Healthy:  appears stated age HEENT: normal Neck supple with no adenopathy JVP normal no bruits no thyromegaly Lungs clear with no wheezing and good diaphragmatic motion Heart:  S1/S2 mild AS  murmur, no rub, gallop or click PMI normal Abdomen: benighn, BS positve, no tenderness, no AAA no bruit.  No HSM or HJR Distal pulses intact with no bruits No edema Skin warm and dry mild bruising in arms Mild LLE weakness from stroke  Gait bad with walker    Current Outpatient Prescriptions  Medication Sig Dispense Refill  . atorvastatin (LIPITOR) 40 MG tablet TAKE 1 TABLET BY MOUTH  DAILY 90 tablet 1  . clopidogrel (PLAVIX) 75 MG tablet Take 1 tablet (75 mg total) by mouth daily with breakfast. 90 tablet 3  . co-enzyme Q-10 50 MG capsule Take 50 mg by mouth daily.      . Flax  OIL Take 1 capsule by mouth daily.      . folic acid (FOLVITE) 1 MG tablet TAKE 1 TABLET EVERY DAY 90 tablet 3  . loratadine (CLARITIN) 10 MG tablet Take 10 mg by mouth daily.    . metoprolol tartrate (LOPRESSOR) 50 MG tablet TAKE 1 TABLET BY MOUTH TWO  TIMES DAILY 180 tablet 3  . Moexipril-Hydrochlorothiazide 15-12.5 MG TABS TAKE 1 TABLET BY MOUTH  DAILY 90 each 1  . Omega-3 Fatty Acids (FISH OIL) 1000 MG CAPS Take 1 capsule by mouth daily.       No current facility-administered medications for this visit.     Allergies  Sulfonamide derivatives  Electrocardiogram:  02/16/14  SR rate 57 lateral T wave changes   02/28/15  Sr rate 59  LVH no acute changes  03/16/16  SR rate 56 limb voltage LVH nonspecific lateral  ST changes  05/08/17 SR rate 54 LVH   Assessment and Plan CAD:  CABG 1999 Non ischemic myovue 2009 Stable with no angina and good activity level.  Continue medical Rx  Chol:  Cholesterol is at goal.  Continue current dose of statin and diet Rx.  No myalgias or side effects.  F/U  LFT's in 6 months. Lab Results  Component Value Date   LDLCALC 86 11/15/2016     HTN: .Well controlled.  Continue current medications and low sodium Dash type diet.  Smoker:  Quit now about 5 years  No wheezing   Stroke: On plavix  f/u neuro  2014 had small right corona radiata ischemic stroke With LUE weakness   Insomnia:  He is taking melatonin, claritin , and zyrtec discussed not doing all 3 and just taking melatonin  Dyspnea:  Seems functional mostly when dressing  EF normal by echo 10/16/16  Aortic stenosis: mild by echo 10/16/16 gradients 6/12 mmHg f/u echo March 2019   F/U with me in a year   Thomas [name]

## 2017-05-08 ENCOUNTER — Encounter: Payer: Self-pay | Admitting: Cardiovascular Disease

## 2017-05-08 ENCOUNTER — Ambulatory Visit (INDEPENDENT_AMBULATORY_CARE_PROVIDER_SITE_OTHER): Payer: Medicare Other | Admitting: Cardiovascular Disease

## 2017-05-08 VITALS — BP 132/86 | HR 54 | Ht 71.0 in | Wt 166.4 lb

## 2017-05-08 DIAGNOSIS — I10 Essential (primary) hypertension: Secondary | ICD-10-CM

## 2017-05-08 DIAGNOSIS — I252 Old myocardial infarction: Secondary | ICD-10-CM

## 2017-05-08 DIAGNOSIS — I25810 Atherosclerosis of coronary artery bypass graft(s) without angina pectoris: Secondary | ICD-10-CM

## 2017-05-08 NOTE — Patient Instructions (Signed)

## 2017-06-08 ENCOUNTER — Other Ambulatory Visit: Payer: Self-pay | Admitting: Cardiovascular Disease

## 2017-09-04 ENCOUNTER — Other Ambulatory Visit: Payer: Self-pay | Admitting: Cardiovascular Disease

## 2017-09-04 ENCOUNTER — Telehealth: Payer: Self-pay | Admitting: *Deleted

## 2017-09-04 DIAGNOSIS — I2510 Atherosclerotic heart disease of native coronary artery without angina pectoris: Secondary | ICD-10-CM

## 2017-09-04 DIAGNOSIS — I2583 Coronary atherosclerosis due to lipid rich plaque: Principal | ICD-10-CM

## 2017-09-04 NOTE — Telephone Encounter (Signed)
[facility] sent fax that Moexipril/HCTZ 15-12.5 mg has been discontinued by the manufacturer and is no longer available. Will send to Dr. Eden Emms for a prescription alternative for a replacement.

## 2017-09-04 NOTE — Telephone Encounter (Signed)
Patients wife, [name] left a msg on the refill vm stating that per [facility] one of the patients medications is going to be d/c'd. I called [name] to inquire about what medication she was referring to and it is the moexipril-hctz. She requested a call back at [phone] when this has been addressed. Thanks, MI

## 2017-09-04 NOTE — Telephone Encounter (Signed)
Hyzaar 20/12.5 mg daily

## 2017-09-05 MED ORDER — LOSARTAN POTASSIUM 25 MG PO TABS: 25.0000 mg | ORAL_TABLET | Freq: Every day | ORAL | 3 refills | Status: DC

## 2017-09-05 MED ORDER — HYDROCHLOROTHIAZIDE 12.5 MG PO CAPS: 12.5000 mg | ORAL_CAPSULE | Freq: Every day | ORAL | 3 refills | Status: DC

## 2017-09-05 NOTE — Telephone Encounter (Signed)
Unable to order Hyzaar 20/12.5. Per Dr. Eden Emms can order Losartan 25 mg daily and HCTZ 12.5 mg daily to replace medication Moexipril/HCTZ.

## 2017-09-05 NOTE — Telephone Encounter (Signed)
Called patient's wife (DPR) to let her know patient's medication changes. Encouraged patient's wife to call with any questions or concerns. Patient's wife verbalized understanding.

## 2017-09-24 ENCOUNTER — Telehealth: Payer: Self-pay

## 2017-09-24 DIAGNOSIS — I350 Nonrheumatic aortic (valve) stenosis: Secondary | ICD-10-CM

## 2017-09-24 NOTE — Telephone Encounter (Signed)
-----   Message from Ethelda Chick, RN sent at 10/19/2016 10:05 AM EST ----- Needs repeat echo 1 year f/u February 2019

## 2017-09-24 NOTE — Telephone Encounter (Signed)
Called patient to make echo appointment and office visit.Patient will have echo and office visit on 10/14/17. Patient verbalized understanding.

## 2017-10-07 NOTE — Progress Notes (Signed)
Patient ID: Thomas [name]., male   DOB: 1940-10-02, 77 y.o.   MRN: [id number]   76 y.o. CABG 1999 EF 58% non ischemic myovue 2009 Quit smoking in 2015.  Stroke in 2014 With balance issues. Using walker and has had PT. Some dyspnea last visit echo updated and reviewed  10/16/16 EF 65-70% Mild AS mean gradient 6 mmhg  Aortic root 40 mmHg  Severe MAC  Snouzer named Thomas [name] that keeps him busy   Updated echo 10/14/17 reviewed stable mild AS mean gradient 6 mmHg still   ROS: Denies fever, malais, weight loss, blurry vision, decreased visual acuity, cough, sputum, SOB, hemoptysis, pleuritic pain, palpitaitons, heartburn, abdominal pain, melena, lower extremity edema, claudication, or rash.  All other systems reviewed and negative  BP 138/78   Pulse 66   Ht 5' 11"  (1.803 m)   Wt 168 lb (76.2 kg)   BMI 23.43 kg/m  Affect appropriate Chronically ill white male  HEENT: normal Neck supple with no adenopathy JVP normal no bruits no thyromegaly Lungs clear with no wheezing and good diaphragmatic motion Heart:  S1/S2 mild AS  murmur, no rub, gallop or click PMI normal Abdomen: benighn, BS positve, no tenderness, no AAA no bruit.  No HSM or HJR Distal pulses intact with no bruits No edema Neuro non-focal broad based gait  Skin warm and dry No muscular weakness    Current Outpatient Medications  Medication Sig Dispense Refill  . atorvastatin (LIPITOR) 40 MG tablet TAKE 1 TABLET BY MOUTH  DAILY 90 tablet 2  . clopidogrel (PLAVIX) 75 MG tablet TAKE 1 TABLET BY MOUTH  DAILY WITH BREAKFAST 90 tablet 3  . co-enzyme Q-10 50 MG capsule Take 50 mg by mouth daily.      . Flax OIL Take 1 capsule by mouth daily.      . folic acid (FOLVITE) 1 MG tablet TAKE 1 TABLET EVERY DAY 90 tablet 3  . hydrochlorothiazide (MICROZIDE) 12.5 MG capsule Take 1 capsule (12.5 mg total) by mouth daily. 90 capsule 3  . loratadine (CLARITIN) 10 MG tablet Take 10 mg by mouth daily.    Marland Kitchen losartan (COZAAR) 25 MG tablet  Take 1 tablet (25 mg total) by mouth daily. 90 tablet 3  . metoprolol tartrate (LOPRESSOR) 50 MG tablet TAKE 1 TABLET BY MOUTH TWO  TIMES DAILY 180 tablet 3  . Omega-3 Fatty Acids (FISH OIL) 1000 MG CAPS Take 1 capsule by mouth daily.       No current facility-administered medications for this visit.     Allergies  Sulfonamide derivatives  Electrocardiogram:  02/16/14  SR rate 57 lateral T wave changes   02/28/15  Sr rate 59  LVH no acute changes  03/16/16  SR rate 56 limb voltage LVH nonspecific lateral  ST changes  05/08/17 SR rate 54 LVH   Assessment and Plan CAD:  CABG 1999 Non ischemic myovue 2009 Stable with no angina and good activity level.  Continue medical Rx  Chol: continue statin LDL 86 11/15/16  LFTls normal  Cholesterol is at goal.  Continue current dose of statin and diet Rx.  No myalgias or side effects.    HTN: .Well controlled.  Continue current medications and low sodium Dash type diet.     Smoker:  Quit now about 5 years  No wheezing   Stroke: On plavix  f/u neuro  2014 had small right corona radiata ischemic stroke With LUE weakness   Insomnia:  He is taking melatonin, claritin ,  and zyrtec discussed not doing all 3 and just taking melatonin  Dyspnea:  Seems functional mostly when dressing  EF normal by echo 10/16/16  Aortic stenosis: mild by echo 10/16/16 gradients 6/12 mmHg no change on echo today f/u echo in a year   F/U with me in a year   [employer]

## 2017-10-14 ENCOUNTER — Other Ambulatory Visit: Payer: Self-pay

## 2017-10-14 ENCOUNTER — Ambulatory Visit: Payer: Medicare Other | Admitting: Cardiovascular Disease

## 2017-10-14 ENCOUNTER — Ambulatory Visit (HOSPITAL_COMMUNITY): Payer: Medicare Other | Attending: Cardiology

## 2017-10-14 ENCOUNTER — Encounter: Payer: Self-pay | Admitting: Cardiovascular Disease

## 2017-10-14 VITALS — BP 138/78 | HR 66 | Ht 71.0 in | Wt 168.0 lb

## 2017-10-14 DIAGNOSIS — I10 Essential (primary) hypertension: Secondary | ICD-10-CM | POA: Diagnosis not present

## 2017-10-14 DIAGNOSIS — I119 Hypertensive heart disease without heart failure: Secondary | ICD-10-CM | POA: Insufficient documentation

## 2017-10-14 DIAGNOSIS — Z951 Presence of aortocoronary bypass graft: Secondary | ICD-10-CM | POA: Insufficient documentation

## 2017-10-14 DIAGNOSIS — I352 Nonrheumatic aortic (valve) stenosis with insufficiency: Secondary | ICD-10-CM | POA: Diagnosis not present

## 2017-10-14 DIAGNOSIS — I2583 Coronary atherosclerosis due to lipid rich plaque: Secondary | ICD-10-CM

## 2017-10-14 DIAGNOSIS — R0600 Dyspnea, unspecified: Secondary | ICD-10-CM | POA: Diagnosis not present

## 2017-10-14 DIAGNOSIS — I2510 Atherosclerotic heart disease of native coronary artery without angina pectoris: Secondary | ICD-10-CM

## 2017-10-14 DIAGNOSIS — Z8673 Personal history of transient ischemic attack (TIA), and cerebral infarction without residual deficits: Secondary | ICD-10-CM | POA: Diagnosis not present

## 2017-10-14 DIAGNOSIS — I350 Nonrheumatic aortic (valve) stenosis: Secondary | ICD-10-CM

## 2017-10-14 DIAGNOSIS — I252 Old myocardial infarction: Secondary | ICD-10-CM | POA: Diagnosis not present

## 2017-10-14 NOTE — Patient Instructions (Addendum)

## 2017-11-03 ENCOUNTER — Inpatient Hospital Stay (HOSPITAL_COMMUNITY)
Admission: EM | Admit: 2017-11-03 | Discharge: 2017-11-06 | DRG: 040 | Disposition: A | Discharge status: Rehabilitation Facility | Payer: Medicare Other | Attending: Internal Medicine | Admitting: Internal Medicine

## 2017-11-03 ENCOUNTER — Encounter (HOSPITAL_COMMUNITY): Payer: Self-pay | Admitting: Emergency Medicine

## 2017-11-03 DIAGNOSIS — E860 Dehydration: Secondary | ICD-10-CM | POA: Diagnosis present

## 2017-11-03 DIAGNOSIS — H5347 Heteronymous bilateral field defects: Secondary | ICD-10-CM | POA: Diagnosis present

## 2017-11-03 DIAGNOSIS — I63431 Cerebral infarction due to embolism of right posterior cerebral artery: Principal | ICD-10-CM | POA: Diagnosis present

## 2017-11-03 DIAGNOSIS — I69354 Hemiplegia and hemiparesis following cerebral infarction affecting left non-dominant side: Secondary | ICD-10-CM

## 2017-11-03 DIAGNOSIS — N182 Chronic kidney disease, stage 2 (mild): Secondary | ICD-10-CM | POA: Diagnosis present

## 2017-11-03 DIAGNOSIS — E785 Hyperlipidemia, unspecified: Secondary | ICD-10-CM | POA: Diagnosis present

## 2017-11-03 DIAGNOSIS — N183 Chronic kidney disease, stage 3 unspecified: Secondary | ICD-10-CM

## 2017-11-03 DIAGNOSIS — I63441 Cerebral infarction due to embolism of right cerebellar artery: Secondary | ICD-10-CM | POA: Diagnosis present

## 2017-11-03 DIAGNOSIS — I6522 Occlusion and stenosis of left carotid artery: Secondary | ICD-10-CM

## 2017-11-03 DIAGNOSIS — I2510 Atherosclerotic heart disease of native coronary artery without angina pectoris: Secondary | ICD-10-CM | POA: Diagnosis present

## 2017-11-03 DIAGNOSIS — G936 Cerebral edema: Secondary | ICD-10-CM | POA: Diagnosis present

## 2017-11-03 DIAGNOSIS — I739 Peripheral vascular disease, unspecified: Secondary | ICD-10-CM | POA: Diagnosis present

## 2017-11-03 DIAGNOSIS — I472 Ventricular tachycardia: Secondary | ICD-10-CM | POA: Diagnosis not present

## 2017-11-03 DIAGNOSIS — Z823 Family history of stroke: Secondary | ICD-10-CM

## 2017-11-03 DIAGNOSIS — D631 Anemia in chronic kidney disease: Secondary | ICD-10-CM | POA: Diagnosis present

## 2017-11-03 DIAGNOSIS — N179 Acute kidney failure, unspecified: Secondary | ICD-10-CM

## 2017-11-03 DIAGNOSIS — R7303 Prediabetes: Secondary | ICD-10-CM

## 2017-11-03 DIAGNOSIS — N390 Urinary tract infection, site not specified: Secondary | ICD-10-CM | POA: Diagnosis present

## 2017-11-03 DIAGNOSIS — I130 Hypertensive heart and chronic kidney disease with heart failure and stage 1 through stage 4 chronic kidney disease, or unspecified chronic kidney disease: Secondary | ICD-10-CM | POA: Diagnosis present

## 2017-11-03 DIAGNOSIS — R29704 NIHSS score 4: Secondary | ICD-10-CM | POA: Diagnosis present

## 2017-11-03 DIAGNOSIS — I25810 Atherosclerosis of coronary artery bypass graft(s) without angina pectoris: Secondary | ICD-10-CM | POA: Diagnosis present

## 2017-11-03 DIAGNOSIS — R414 Neurologic neglect syndrome: Secondary | ICD-10-CM | POA: Diagnosis present

## 2017-11-03 DIAGNOSIS — I6340 Cerebral infarction due to embolism of unspecified cerebral artery: Secondary | ICD-10-CM

## 2017-11-03 DIAGNOSIS — Z882 Allergy status to sulfonamides status: Secondary | ICD-10-CM

## 2017-11-03 DIAGNOSIS — N400 Enlarged prostate without lower urinary tract symptoms: Secondary | ICD-10-CM | POA: Diagnosis present

## 2017-11-03 DIAGNOSIS — Z79899 Other long term (current) drug therapy: Secondary | ICD-10-CM

## 2017-11-03 DIAGNOSIS — H9193 Unspecified hearing loss, bilateral: Secondary | ICD-10-CM | POA: Diagnosis present

## 2017-11-03 DIAGNOSIS — Z8673 Personal history of transient ischemic attack (TIA), and cerebral infarction without residual deficits: Secondary | ICD-10-CM

## 2017-11-03 DIAGNOSIS — J302 Other seasonal allergic rhinitis: Secondary | ICD-10-CM

## 2017-11-03 DIAGNOSIS — Z951 Presence of aortocoronary bypass graft: Secondary | ICD-10-CM

## 2017-11-03 DIAGNOSIS — I10 Essential (primary) hypertension: Secondary | ICD-10-CM

## 2017-11-03 DIAGNOSIS — I63411 Cerebral infarction due to embolism of right middle cerebral artery: Secondary | ICD-10-CM | POA: Diagnosis present

## 2017-11-03 DIAGNOSIS — I5032 Chronic diastolic (congestive) heart failure: Secondary | ICD-10-CM | POA: Diagnosis present

## 2017-11-03 DIAGNOSIS — G9341 Metabolic encephalopathy: Secondary | ICD-10-CM | POA: Diagnosis present

## 2017-11-03 DIAGNOSIS — I639 Cerebral infarction, unspecified: Secondary | ICD-10-CM | POA: Diagnosis present

## 2017-11-03 DIAGNOSIS — Z7902 Long term (current) use of antithrombotics/antiplatelets: Secondary | ICD-10-CM

## 2017-11-03 DIAGNOSIS — I6523 Occlusion and stenosis of bilateral carotid arteries: Secondary | ICD-10-CM | POA: Diagnosis present

## 2017-11-03 DIAGNOSIS — Z87891 Personal history of nicotine dependence: Secondary | ICD-10-CM

## 2017-11-03 HISTORY — DX: Chronic kidney disease, stage 2 (mild): N18.2

## 2017-11-03 NOTE — ED Triage Notes (Signed)
Pt BIB EMS from home for AMS, LSN 1830. Per EMS and family, pt increased confusion, saying he's doing one thing while doing another, and having difficulty seeing out of left eye.  Pt alert to self, time, and place, disoriented to situation. No facial droop or weakness. Resp e/u. Family at bedside.

## 2017-11-03 NOTE — ED Provider Notes (Signed)
[name] EMERGENCY DEPARTMENT Provider Note   CSN: [id number] Arrival date & time: 11/03/17  2323     History   Chief Complaint Chief Complaint  Patient presents with  . Altered Mental Status    HPI Thomas [name]. is a 77 y.o. male.  Patient presents after wife called EMS for confusion. She reports they were watching television together until around 8:30 and she left the room. She returned to find him knocking his walker against the floor stating it wasn't working right. Walker was fine. The patient was confused as to his surroundings, stated he was going to his living room when in his living room. He did not remember how to work the TV remote; could not remember where his bathroom was. She did not see any facial droop or extremity weakness. He has had a stroke in the past but this is a different presentation when compared to previous CVA. The patient denies symptoms. He does not remember being confused at home or events that his wife describes. He denies pain, weakness, numbness, visual changes, SOB, CP. There has been no vomiting or diarrhea. No fever.   The history is provided by the patient and the spouse. No language interpreter was used.  Altered Mental Status   Associated symptoms include confusion. Pertinent negatives include no weakness.    Past Medical History:  Diagnosis Date  . CHF (congestive heart failure) (HCC) 1999  . Coronary artery disease   . Hx of colonic polyps   . Hyperlipidemia   . Hyperplastic colon polyp 04/15/2007  . Hypertension   . Prostatic hypertrophy, benign    with elevated PSA  . TIA (transient ischemic attack) 2003   "mini stroke" (03/19/2013)    Patient Active Problem List   Diagnosis Date Noted  . Left hemiparesis (HCC) 03/19/2013  . Cerebral infarction (HCC) 03/19/2013  . Left leg weakness 03/19/2013  . CAD, ARTERY BYPASS GRAFT 09/30/2008  . 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 Borderline hyperglycemia 12/03/2007    . Other and unspecified hyperlipidemia 07/28/2007  . Essential hypertension 07/28/2007  . 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 Coronary atherosclerosis 07/28/2007  . BENIGN PROSTATIC HYPERTROPHY 07/28/2007  . COLONIC POLYPS, 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    Past Surgical History:  Procedure Laterality Date  . CARDIAC CATHETERIZATION    . CATARACT EXTRACTION W/ INTRAOCULAR LENS  IMPLANT, BILATERAL Bilateral 1998  . COLONOSCOPY  2008  . CORONARY ARTERY BYPASS GRAFT  1999   x4        Home Medications    Prior to Admission medications   Medication Sig Start Date End Date Taking? Authorizing Provider  atorvastatin (LIPITOR) 40 MG tablet TAKE 1 TABLET BY MOUTH  DAILY 09/04/17   Wendall Stade, MD  clopidogrel (PLAVIX) 75 MG tablet TAKE 1 TABLET BY MOUTH  DAILY WITH BREAKFAST 06/10/17   Wendall Stade, MD  co-enzyme Q-10 50 MG capsule Take 50 mg by mouth daily.      [provider]  Flax OIL Take 1 capsule by mouth daily.      [provider]  folic acid (FOLVITE) 1 MG tablet TAKE 1 TABLET EVERY DAY 01/28/17   Wendall Stade, MD  hydrochlorothiazide (MICROZIDE) 12.5 MG capsule Take 1 capsule (12.5 mg total) by mouth daily. 09/05/17   Wendall Stade, MD  loratadine (CLARITIN) 10 MG tablet Take 10 mg by mouth daily.    [provider]  losartan (COZAAR) 25 MG tablet Take 1  tablet (25 mg total) by mouth daily. 09/05/17   Wendall Stade, MD  metoprolol tartrate (LOPRESSOR) 50 MG tablet TAKE 1 TABLET BY MOUTH TWO  TIMES DAILY 01/28/17   Wendall Stade, MD  Omega-3 Fatty Acids (FISH OIL) 1000 MG CAPS Take 1 capsule by mouth daily.      [provider]    Family History Family History  Problem Relation Age of Onset  . Coronary artery disease Mother   . Stroke Father     Social History Social History   Tobacco Use  . Smoking status: Former Smoker    Packs/day: 0.75    Years: 50.00    Pack years: 37.50    Types: Cigarettes    Last attempt to quit:  03/19/2013    Years since quitting: 4.6  . Smokeless tobacco: Never Used  Substance Use Topics  . Alcohol use: Yes    Alcohol/week: 0.6 oz    Types: 1 Cans of beer per week    Comment: 03/19/2013 "1 beer/week" does not drink anymore  . Drug use: No     Allergies   Sulfonamide derivatives   Review of Systems Review of Systems  Constitutional: Negative for chills and fever.  HENT: Negative.   Eyes: Negative for visual disturbance.  Respiratory: Negative.  Negative for cough and shortness of breath.   Cardiovascular: Negative.  Negative for chest pain.  Gastrointestinal: Negative.  Negative for abdominal pain, nausea and vomiting.  Genitourinary: Negative.   Musculoskeletal: Negative.  Negative for myalgias and neck pain.  Skin: Negative.   Neurological: Negative.  Negative for dizziness, syncope, facial asymmetry, speech difficulty, weakness, numbness and headaches.  Psychiatric/Behavioral: Positive for confusion.     Physical Exam Updated Vital Signs BP (!) 143/81 (BP Location: Right Arm)   Pulse 65   Temp 98.8 F (37.1 C) (Oral)   Resp 12   SpO2 96%   Physical Exam  Constitutional: He appears well-developed and well-nourished.  HENT:  Head: Normocephalic.  Neck: Normal range of motion. Neck supple.  Cardiovascular: Normal rate and regular rhythm.  Pulmonary/Chest: Effort normal and breath sounds normal. He has no wheezes. He has no rales.  Abdominal: Soft. Bowel sounds are normal. There is no tenderness. There is no rebound and no guarding.  Musculoskeletal: Normal range of motion.  Neurological: He is alert. He displays normal reflexes. He exhibits normal muscle tone.  CN's 3-12 grossly intact. Speech is clear and focused. No facial asymmetry. No lateralizing weakness. Reflexes are equal. No deficits of coordination. He has left hemianopia. He does not remember his age but knows his birthdate. NIH stroke scale = 4.  Skin: Skin is warm and dry. No rash noted.    Psychiatric: He has a normal mood and affect.     ED Treatments / Results  Labs (all labs ordered are listed, but only abnormal results are displayed) Labs Reviewed  ETHANOL  PROTIME-INR  APTT  CBC  DIFFERENTIAL  COMPREHENSIVE METABOLIC PANEL  RAPID URINE DRUG SCREEN, HOSP PERFORMED  URINALYSIS, ROUTINE W REFLEX MICROSCOPIC  I-STAT CHEM 8, ED  I-STAT TROPONIN, ED    EKG None  Radiology No results found.  Procedures Procedures (including critical care time)  Medications Ordered in ED Medications - No data to display   Initial Impression / Assessment and Plan / ED Course  I have reviewed the triage vital signs and the nursing notes.  Pertinent labs & imaging results that were available during my care of the patient were  reviewed by me and considered in my medical decision making (see chart for details).     Patient arrives with history provided by wife and EMS. The patient denies symptoms. No history of visual loss - new tonight. Some confusion. Code stroke called and Dr. Amada Jupiter in to see. Patient transported to CT immediately. VSS. NIH score of 4.  Patient is found to have an acute vs subacute right infarct w/o hemorrhage. Per neurology, no intervention recommended. Patient will be admitted to medicine, neurology to follow.   Discussed with Dr. Clyde Lundborg, [facility], who accepts the patient onto his service.   Final Clinical Impressions(s) / ED Diagnoses   Final diagnoses:  None   1. Acute vs subacute right nonhemorrhagic CVA  ED Discharge Orders    None       Elpidio Anis, PA-C 11/04/17 0435    Derwood Kaplan, MD 11/04/17 2116

## 2017-11-04 ENCOUNTER — Other Ambulatory Visit: Payer: Self-pay

## 2017-11-04 ENCOUNTER — Encounter (HOSPITAL_COMMUNITY): Payer: Self-pay | Admitting: Internal Medicine

## 2017-11-04 ENCOUNTER — Emergency Department (HOSPITAL_COMMUNITY): Payer: Medicare Other

## 2017-11-04 ENCOUNTER — Inpatient Hospital Stay (HOSPITAL_COMMUNITY): Payer: Medicare Other

## 2017-11-04 DIAGNOSIS — N390 Urinary tract infection, site not specified: Secondary | ICD-10-CM | POA: Diagnosis present

## 2017-11-04 DIAGNOSIS — R338 Other retention of urine: Secondary | ICD-10-CM | POA: Diagnosis not present

## 2017-11-04 DIAGNOSIS — I6523 Occlusion and stenosis of bilateral carotid arteries: Secondary | ICD-10-CM | POA: Diagnosis present

## 2017-11-04 DIAGNOSIS — A499 Bacterial infection, unspecified: Secondary | ICD-10-CM | POA: Diagnosis not present

## 2017-11-04 DIAGNOSIS — H9193 Unspecified hearing loss, bilateral: Secondary | ICD-10-CM | POA: Diagnosis present

## 2017-11-04 DIAGNOSIS — I6930 Unspecified sequelae of cerebral infarction: Secondary | ICD-10-CM | POA: Diagnosis not present

## 2017-11-04 DIAGNOSIS — I5032 Chronic diastolic (congestive) heart failure: Secondary | ICD-10-CM | POA: Diagnosis present

## 2017-11-04 DIAGNOSIS — I63431 Cerebral infarction due to embolism of right posterior cerebral artery: Secondary | ICD-10-CM | POA: Diagnosis not present

## 2017-11-04 DIAGNOSIS — E785 Hyperlipidemia, unspecified: Secondary | ICD-10-CM

## 2017-11-04 DIAGNOSIS — N183 Chronic kidney disease, stage 3 (moderate): Secondary | ICD-10-CM | POA: Diagnosis not present

## 2017-11-04 DIAGNOSIS — D72829 Elevated white blood cell count, unspecified: Secondary | ICD-10-CM | POA: Diagnosis not present

## 2017-11-04 DIAGNOSIS — R7303 Prediabetes: Secondary | ICD-10-CM

## 2017-11-04 DIAGNOSIS — I63531 Cerebral infarction due to unspecified occlusion or stenosis of right posterior cerebral artery: Secondary | ICD-10-CM | POA: Diagnosis not present

## 2017-11-04 DIAGNOSIS — G9341 Metabolic encephalopathy: Secondary | ICD-10-CM | POA: Diagnosis not present

## 2017-11-04 DIAGNOSIS — D631 Anemia in chronic kidney disease: Secondary | ICD-10-CM | POA: Diagnosis present

## 2017-11-04 DIAGNOSIS — I69354 Hemiplegia and hemiparesis following cerebral infarction affecting left non-dominant side: Secondary | ICD-10-CM | POA: Diagnosis not present

## 2017-11-04 DIAGNOSIS — N3000 Acute cystitis without hematuria: Secondary | ICD-10-CM | POA: Diagnosis not present

## 2017-11-04 DIAGNOSIS — R269 Unspecified abnormalities of gait and mobility: Secondary | ICD-10-CM | POA: Diagnosis not present

## 2017-11-04 DIAGNOSIS — E860 Dehydration: Secondary | ICD-10-CM | POA: Diagnosis present

## 2017-11-04 DIAGNOSIS — N179 Acute kidney failure, unspecified: Secondary | ICD-10-CM | POA: Diagnosis present

## 2017-11-04 DIAGNOSIS — I25810 Atherosclerosis of coronary artery bypass graft(s) without angina pectoris: Secondary | ICD-10-CM

## 2017-11-04 DIAGNOSIS — J302 Other seasonal allergic rhinitis: Secondary | ICD-10-CM | POA: Diagnosis not present

## 2017-11-04 DIAGNOSIS — I472 Ventricular tachycardia: Secondary | ICD-10-CM | POA: Diagnosis not present

## 2017-11-04 DIAGNOSIS — I69312 Visuospatial deficit and spatial neglect following cerebral infarction: Secondary | ICD-10-CM | POA: Diagnosis not present

## 2017-11-04 DIAGNOSIS — I6340 Cerebral infarction due to embolism of unspecified cerebral artery: Secondary | ICD-10-CM | POA: Diagnosis not present

## 2017-11-04 DIAGNOSIS — I6522 Occlusion and stenosis of left carotid artery: Secondary | ICD-10-CM | POA: Diagnosis not present

## 2017-11-04 DIAGNOSIS — G936 Cerebral edema: Secondary | ICD-10-CM | POA: Diagnosis not present

## 2017-11-04 DIAGNOSIS — H5347 Heteronymous bilateral field defects: Secondary | ICD-10-CM | POA: Diagnosis present

## 2017-11-04 DIAGNOSIS — I10 Essential (primary) hypertension: Secondary | ICD-10-CM | POA: Diagnosis not present

## 2017-11-04 DIAGNOSIS — N182 Chronic kidney disease, stage 2 (mild): Secondary | ICD-10-CM | POA: Diagnosis not present

## 2017-11-04 DIAGNOSIS — I2510 Atherosclerotic heart disease of native coronary artery without angina pectoris: Secondary | ICD-10-CM | POA: Diagnosis not present

## 2017-11-04 DIAGNOSIS — H53462 Homonymous bilateral field defects, left side: Secondary | ICD-10-CM | POA: Diagnosis not present

## 2017-11-04 DIAGNOSIS — R8290 Unspecified abnormal findings in urine: Secondary | ICD-10-CM | POA: Diagnosis not present

## 2017-11-04 DIAGNOSIS — I63441 Cerebral infarction due to embolism of right cerebellar artery: Secondary | ICD-10-CM | POA: Diagnosis not present

## 2017-11-04 DIAGNOSIS — I739 Peripheral vascular disease, unspecified: Secondary | ICD-10-CM | POA: Diagnosis present

## 2017-11-04 DIAGNOSIS — N401 Enlarged prostate with lower urinary tract symptoms: Secondary | ICD-10-CM | POA: Diagnosis not present

## 2017-11-04 DIAGNOSIS — I6389 Other cerebral infarction: Secondary | ICD-10-CM | POA: Diagnosis not present

## 2017-11-04 DIAGNOSIS — I639 Cerebral infarction, unspecified: Secondary | ICD-10-CM | POA: Diagnosis present

## 2017-11-04 DIAGNOSIS — Z8673 Personal history of transient ischemic attack (TIA), and cerebral infarction without residual deficits: Secondary | ICD-10-CM

## 2017-11-04 DIAGNOSIS — R29704 NIHSS score 4: Secondary | ICD-10-CM | POA: Diagnosis present

## 2017-11-04 DIAGNOSIS — I69398 Other sequelae of cerebral infarction: Secondary | ICD-10-CM | POA: Diagnosis not present

## 2017-11-04 DIAGNOSIS — I63411 Cerebral infarction due to embolism of right middle cerebral artery: Secondary | ICD-10-CM | POA: Diagnosis not present

## 2017-11-04 DIAGNOSIS — R414 Neurologic neglect syndrome: Secondary | ICD-10-CM | POA: Diagnosis not present

## 2017-11-04 DIAGNOSIS — I130 Hypertensive heart and chronic kidney disease with heart failure and stage 1 through stage 4 chronic kidney disease, or unspecified chronic kidney disease: Secondary | ICD-10-CM | POA: Diagnosis not present

## 2017-11-04 DIAGNOSIS — E871 Hypo-osmolality and hyponatremia: Secondary | ICD-10-CM | POA: Diagnosis not present

## 2017-11-04 DIAGNOSIS — Z87891 Personal history of nicotine dependence: Secondary | ICD-10-CM | POA: Diagnosis not present

## 2017-11-04 LAB — DIFFERENTIAL
BASOS PCT: 0 %
Basophils Absolute: 0 10*3/uL (ref 0.0–0.1)
Eosinophils Absolute: 0.2 10*3/uL (ref 0.0–0.7)
Eosinophils Relative: 2 %
LYMPHS PCT: 16 %
Lymphs Abs: 1.6 10*3/uL (ref 0.7–4.0)
Monocytes Absolute: 0.7 10*3/uL (ref 0.1–1.0)
Monocytes Relative: 7 %
NEUTROS ABS: 7.7 10*3/uL (ref 1.7–7.7)
NEUTROS PCT: 75 %

## 2017-11-04 LAB — RAPID URINE DRUG SCREEN, HOSP PERFORMED
AMPHETAMINES: NOT DETECTED
BENZODIAZEPINES: NOT DETECTED
Barbiturates: NOT DETECTED
Cocaine: NOT DETECTED
OPIATES: NOT DETECTED
TETRAHYDROCANNABINOL: NOT DETECTED

## 2017-11-04 LAB — URINALYSIS, ROUTINE W REFLEX MICROSCOPIC
Bilirubin Urine: NEGATIVE
GLUCOSE, UA: NEGATIVE mg/dL
KETONES UR: NEGATIVE mg/dL
NITRITE: NEGATIVE
PROTEIN: 30 mg/dL — AB
SQUAMOUS EPITHELIAL / LPF: NONE SEEN
Specific Gravity, Urine: 1.038 — ABNORMAL HIGH (ref 1.005–1.030)
pH: 6 (ref 5.0–8.0)

## 2017-11-04 LAB — PROTIME-INR
INR: 1.13
Prothrombin Time: 14.4 seconds (ref 11.4–15.2)

## 2017-11-04 LAB — COMPREHENSIVE METABOLIC PANEL
ALBUMIN: 3.6 g/dL (ref 3.5–5.0)
ALK PHOS: 96 U/L (ref 38–126)
ALT: 13 U/L — AB (ref 17–63)
ANION GAP: 11 (ref 5–15)
AST: 24 U/L (ref 15–41)
BUN: 30 mg/dL — ABNORMAL HIGH (ref 6–20)
CHLORIDE: 106 mmol/L (ref 101–111)
CO2: 20 mmol/L — AB (ref 22–32)
CREATININE: 1.8 mg/dL — AB (ref 0.61–1.24)
Calcium: 8.7 mg/dL — ABNORMAL LOW (ref 8.9–10.3)
GFR calc Af Amer: 40 mL/min — ABNORMAL LOW (ref >60)
GFR calc non Af Amer: 35 mL/min — ABNORMAL LOW (ref >60)
GLUCOSE: 119 mg/dL — AB (ref 65–99)
Potassium: 4.2 mmol/L (ref 3.5–5.1)
Sodium: 137 mmol/L (ref 135–145)
Total Bilirubin: 0.7 mg/dL (ref 0.3–1.2)
Total Protein: 6.5 g/dL (ref 6.5–8.1)

## 2017-11-04 LAB — ETHANOL

## 2017-11-04 LAB — I-STAT CHEM 8, ED
BUN: 32 mg/dL — ABNORMAL HIGH (ref 6–20)
CHLORIDE: 106 mmol/L (ref 101–111)
CREATININE: 1.8 mg/dL — AB (ref 0.61–1.24)
Calcium, Ion: 1.07 mmol/L — ABNORMAL LOW (ref 1.15–1.40)
GLUCOSE: 118 mg/dL — AB (ref 65–99)
HCT: 40 % (ref 39.0–52.0)
Hemoglobin: 13.6 g/dL (ref 13.0–17.0)
POTASSIUM: 4.1 mmol/L (ref 3.5–5.1)
Sodium: 139 mmol/L (ref 135–145)
TCO2: 23 mmol/L (ref 22–32)

## 2017-11-04 LAB — APTT: APTT: 29 s (ref 24–36)

## 2017-11-04 LAB — I-STAT TROPONIN, ED: Troponin i, poc: 0 ng/mL (ref 0.00–0.08)

## 2017-11-04 LAB — CREATININE, URINE, RANDOM: Creatinine, Urine: 73.08 mg/dL

## 2017-11-04 LAB — LIPID PANEL
CHOL/HDL RATIO: 3.3 ratio
Cholesterol: 138 mg/dL (ref 0–200)
HDL: 42 mg/dL (ref >40)
LDL CALC: 80 mg/dL (ref 0–99)
TRIGLYCERIDES: 80 mg/dL (ref <150)
VLDL: 16 mg/dL (ref 0–40)

## 2017-11-04 LAB — HEMOGLOBIN A1C
Hgb A1c MFr Bld: 5.8 % — ABNORMAL HIGH (ref 4.8–5.6)
Mean Plasma Glucose: 119.76 mg/dL

## 2017-11-04 LAB — CBC
HCT: 40.5 % (ref 39.0–52.0)
Hemoglobin: 13.6 g/dL (ref 13.0–17.0)
MCH: 31.1 pg (ref 26.0–34.0)
MCHC: 33.6 g/dL (ref 30.0–36.0)
MCV: 92.5 fL (ref 78.0–100.0)
Platelets: 200 10*3/uL (ref 150–400)
RBC: 4.38 MIL/uL (ref 4.22–5.81)
RDW: 13.2 % (ref 11.5–15.5)
WBC: 10.2 10*3/uL (ref 4.0–10.5)

## 2017-11-04 LAB — BRAIN NATRIURETIC PEPTIDE: B NATRIURETIC PEPTIDE 5: 202.7 pg/mL — AB (ref 0.0–100.0)

## 2017-11-04 MED ORDER — FLAX PO OIL: 1.0000 | TOPICAL_OIL | Freq: Every day | ORAL | Status: DC

## 2017-11-04 MED ORDER — ATORVASTATIN CALCIUM 80 MG PO TABS
80.0000 mg | ORAL_TABLET | Freq: Every day | ORAL | Status: DC
  Administered 2017-11-04 – 2017-11-06 (×3): 80 mg via ORAL
  Filled 2017-11-04 (×3): qty 1

## 2017-11-04 MED ORDER — IOPAMIDOL (ISOVUE-370) INJECTION 76%
100.0000 mL | Freq: Once | INTRAVENOUS | Status: AC | PRN
  Administered 2017-11-04: 100 mL via INTRAVENOUS

## 2017-11-04 MED ORDER — HYDRALAZINE HCL 20 MG/ML IJ SOLN: 5.0000 mg | INTRAMUSCULAR | Status: DC | PRN

## 2017-11-04 MED ORDER — LORAZEPAM 2 MG/ML IJ SOLN
0.5000 mg | Freq: Once | INTRAMUSCULAR | Status: AC
  Administered 2017-11-04: 0.5 mg via INTRAVENOUS
  Filled 2017-11-04: qty 1

## 2017-11-04 MED ORDER — STROKE: EARLY STAGES OF RECOVERY BOOK
Freq: Once | Status: AC
  Administered 2017-11-04: 05:00:00
  Filled 2017-11-04: qty 1

## 2017-11-04 MED ORDER — ASPIRIN 300 MG RE SUPP: 300.0000 mg | Freq: Every day | RECTAL | Status: DC

## 2017-11-04 MED ORDER — ACETAMINOPHEN 325 MG PO TABS: 650.0000 mg | ORAL_TABLET | ORAL | Status: DC | PRN

## 2017-11-04 MED ORDER — ASPIRIN 325 MG PO TABS
325.0000 mg | ORAL_TABLET | Freq: Every day | ORAL | Status: DC
  Administered 2017-11-04: 325 mg via ORAL
  Filled 2017-11-04 (×2): qty 1

## 2017-11-04 MED ORDER — CO-ENZYME Q-10 50 MG PO CAPS: 50.0000 mg | ORAL_CAPSULE | Freq: Every day | ORAL | Status: DC

## 2017-11-04 MED ORDER — LORATADINE 10 MG PO TABS
10.0000 mg | ORAL_TABLET | Freq: Every day | ORAL | Status: DC
  Administered 2017-11-04 – 2017-11-06 (×3): 10 mg via ORAL
  Filled 2017-11-04 (×3): qty 1

## 2017-11-04 MED ORDER — ASPIRIN EC 81 MG PO TBEC: 81.0000 mg | DELAYED_RELEASE_TABLET | Freq: Every day | ORAL | Status: DC

## 2017-11-04 MED ORDER — IPRATROPIUM BROMIDE 0.02 % IN SOLN
0.5000 mg | Freq: Once | RESPIRATORY_TRACT | Status: AC
  Administered 2017-11-04: 0.5 mg via RESPIRATORY_TRACT
  Filled 2017-11-04: qty 2.5

## 2017-11-04 MED ORDER — ZOLPIDEM TARTRATE 5 MG PO TABS: 5.0000 mg | ORAL_TABLET | Freq: Every evening | ORAL | Status: DC | PRN

## 2017-11-04 MED ORDER — FOLIC ACID 1 MG PO TABS
1.0000 mg | ORAL_TABLET | Freq: Every day | ORAL | Status: DC
  Administered 2017-11-04 – 2017-11-06 (×3): 1 mg via ORAL
  Filled 2017-11-04 (×3): qty 1

## 2017-11-04 MED ORDER — ENOXAPARIN SODIUM 40 MG/0.4ML ~~LOC~~ SOLN
40.0000 mg | SUBCUTANEOUS | Status: DC
  Administered 2017-11-04 – 2017-11-06 (×3): 40 mg via SUBCUTANEOUS
  Filled 2017-11-04 (×4): qty 0.4

## 2017-11-04 MED ORDER — ACETAMINOPHEN 325 MG PO TABS: 650.0000 mg | ORAL_TABLET | Freq: Four times a day (QID) | ORAL | Status: DC | PRN

## 2017-11-04 MED ORDER — OMEGA-3-ACID ETHYL ESTERS 1 G PO CAPS
1.0000 g | ORAL_CAPSULE | Freq: Every day | ORAL | Status: DC
  Administered 2017-11-04 – 2017-11-06 (×3): 1 g via ORAL
  Filled 2017-11-04 (×3): qty 1

## 2017-11-04 MED ORDER — GUAIFENESIN ER 600 MG PO TB12
1200.0000 mg | ORAL_TABLET | Freq: Two times a day (BID) | ORAL | Status: DC | PRN
  Administered 2017-11-05: 1200 mg via ORAL
  Filled 2017-11-04: qty 2

## 2017-11-04 MED ORDER — CLOPIDOGREL BISULFATE 75 MG PO TABS: 75.0000 mg | ORAL_TABLET | Freq: Every day | ORAL | Status: DC

## 2017-11-04 MED ORDER — SODIUM CHLORIDE 0.9 % IV SOLN: INTRAVENOUS | Status: DC

## 2017-11-04 MED ORDER — CLOPIDOGREL BISULFATE 75 MG PO TABS
75.0000 mg | ORAL_TABLET | Freq: Every day | ORAL | Status: DC
  Administered 2017-11-05 – 2017-11-06 (×2): 75 mg via ORAL
  Filled 2017-11-04 (×2): qty 1

## 2017-11-04 MED ORDER — ONDANSETRON HCL 4 MG/2ML IJ SOLN: 4.0000 mg | Freq: Three times a day (TID) | INTRAMUSCULAR | Status: DC | PRN

## 2017-11-04 MED ORDER — ASPIRIN EC 325 MG PO TBEC
325.0000 mg | DELAYED_RELEASE_TABLET | Freq: Every day | ORAL | Status: DC
  Administered 2017-11-05 – 2017-11-06 (×2): 325 mg via ORAL
  Filled 2017-11-04 (×3): qty 1

## 2017-11-04 MED ORDER — SODIUM CHLORIDE 0.9 % IV SOLN
INTRAVENOUS | Status: DC
  Administered 2017-11-04 (×2): via INTRAVENOUS

## 2017-11-04 MED ORDER — ACETAMINOPHEN 650 MG RE SUPP: 650.0000 mg | RECTAL | Status: DC | PRN

## 2017-11-04 MED ORDER — ALBUTEROL SULFATE (2.5 MG/3ML) 0.083% IN NEBU
5.0000 mg | INHALATION_SOLUTION | Freq: Once | RESPIRATORY_TRACT | Status: AC
  Administered 2017-11-04: 5 mg via RESPIRATORY_TRACT
  Filled 2017-11-04: qty 6

## 2017-11-04 MED ORDER — IOPAMIDOL (ISOVUE-370) INJECTION 76%
INTRAVENOUS | Status: AC
  Filled 2017-11-04: qty 100

## 2017-11-04 MED ORDER — SENNOSIDES-DOCUSATE SODIUM 8.6-50 MG PO TABS
1.0000 | ORAL_TABLET | Freq: Every evening | ORAL | Status: DC | PRN
Start: 2017-11-04 — End: 2017-11-06

## 2017-11-04 MED ORDER — ACETAMINOPHEN 160 MG/5ML PO SOLN: 650.0000 mg | ORAL | Status: DC | PRN

## 2017-11-04 MED ORDER — SODIUM CHLORIDE 0.9 % IV SOLN
1.0000 g | INTRAVENOUS | Status: DC
  Administered 2017-11-04 – 2017-11-06 (×3): 1 g via INTRAVENOUS
  Filled 2017-11-04 (×3): qty 10

## 2017-11-04 NOTE — ED Notes (Signed)
Pt previously anxious stating he was unable to urinate. Pt given ativan, given urinal and urinating at this time. Pt drowsy on assessment, will wait to do stroke swallow screen until pt more alert. Family aware not to give pt anything to eat or drink until RN does swallow screen.

## 2017-11-04 NOTE — H&P (Addendum)
History and Physical    Thomas [name]. ZOX:[id number] DOB: 08-Feb-1941 DOA: 11/03/2017  Referring MD/NP/PA:   PCP: Shirline Frees, NP   Patient coming from:  The patient is coming from home.  At baseline, pt is independent for most of ADL.   Chief Complaint: AMS and difficult seeing out of left eye  HPI: Thomas Ates. is a 77 y.o. male with medical history significant of stroke with mild left leg weakness, hypertension, hyperlipidemia, CAD, CABG, dCHF, CKD-II, who presents with altered mental status and difficulty seeing out of left eye.  Per pt's wife, pt was noted to be confused at about 8:30 PM. The patient was confused to his surroundings, not remember how to work the TV remote; could not remember where his bathroom was. Pt had episode of difficulty seeing out of left eye. 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 He has chronic mild left leg weakness from previous stroke, which did not change. Patient does not have new unilateral numbness or tingliness in extremities. Patient denies chest pain, shortness breath, cough, fever or chills. No nausea, vomiting, diarrhea, abdominal pain or symptoms of UTI.  ED Course: pt was found to have WBC 10.2, INR 1.13, PTT 29, negative troponin, pending urinalysis, alcohol level less than 10, worsening renal function, temperature normal, no tachycardia, oxygen saturation 95% on room air. Pt is placed on tele bed for obs. Dr. Amada Jupiter of neurology was consulted.  CT-head showed : 1.Positive for acute to subacute right PCA territory infarct with cytotoxic edema but no hemorrhage or mass effect. 2. Suspicion of hyperdense right PCA indicating occlusion. 3. Underlying advanced chronic small vessel disease.  CTA of head and neck and CT of cerebral perfusion: 1. Unreliable CBF results due to cytotoxic edema in the Right PCA territory. Completed infarct of 30 mL in the medial right occipital lobe, with 2. There is poor flow in the right P3  divisions, but the right P1 and P2 segments remain patent. 3. Positive for severe atherosclerosis and multifocal other high-grade intra- and extracranial stenosis;  high-grade Left ICA origin radiographic string sign stenosis due to bulky calcified plaque; moderate to severe bilateral ICA siphon stenosis appears, progressed since 2014 and is due to calcified plaque, with multifocal high-grade stenoses on the Left ; moderate to severe stenosis of the non dominant Right Vertebral. Artery at both its origin and V4 segments.; moderate to severe dominant Left Vertebral Artery origin stenosis.  Review of Systems:   General: no fevers, chills, no body weight gain, has fatigue HEENT: had vision loss in left eye, has poor hearing, No sore throat Respiratory: no dyspnea, coughing, wheezing CV: no chest pain, no palpitations GI: no nausea, vomiting, abdominal pain, diarrhea, constipation GU: no dysuria, burning on urination, increased urinary frequency, hematuria  Ext: no leg edema Neuro: has confusion, difficulty seeing out of left eye Skin: no rash, no skin tear. MSK: No muscle spasm, no deformity, no limitation of range of movement in spin Heme: No easy bruising.  Travel history: No recent long distant travel.  Allergy:  Allergies  Allergen Reactions  . Sulfonamide Derivatives     REACTION: Weak,lethargic    Past Medical History:  Diagnosis Date  . CHF (congestive heart failure) (HCC) 1999  . CKD (chronic kidney disease), stage II   . Coronary artery disease   . Hx of colonic polyps   . Hyperlipidemia   . Hyperplastic colon polyp 04/15/2007  . Hypertension   . Prostatic hypertrophy, benign  with elevated PSA  . TIA (transient ischemic attack) 2003   "mini stroke" (03/19/2013)    Past Surgical History:  Procedure Laterality Date  . CARDIAC CATHETERIZATION    . CATARACT EXTRACTION W/ INTRAOCULAR LENS  IMPLANT, BILATERAL Bilateral 1998  . COLONOSCOPY  2008  . CORONARY ARTERY BYPASS  GRAFT  1999   x4    Social History:  reports that he quit smoking about 4 years ago. His smoking use included cigarettes. He has a 37.50 pack-year smoking history. He has never used smokeless tobacco. He reports that he drinks about 0.6 oz of alcohol per week. He reports that he does not use drugs.  Family History:  Family History  Problem Relation Age of Onset  . Coronary artery disease Mother   . Stroke Father      Prior to Admission medications   Medication Sig Start Date End Date Taking? Authorizing Provider  atorvastatin (LIPITOR) 40 MG tablet TAKE 1 TABLET BY MOUTH  DAILY 09/04/17  Yes Wendall Stade, MD  clopidogrel (PLAVIX) 75 MG tablet TAKE 1 TABLET BY MOUTH  DAILY WITH BREAKFAST 06/10/17  Yes Wendall Stade, MD  co-enzyme Q-10 50 MG capsule Take 50 mg by mouth daily.     Yes [provider]  Flax OIL Take 1 capsule by mouth daily.     Yes [provider]  folic acid (FOLVITE) 1 MG tablet TAKE 1 TABLET EVERY DAY 01/28/17  Yes Wendall Stade, MD  hydrochlorothiazide (MICROZIDE) 12.5 MG capsule Take 1 capsule (12.5 mg total) by mouth daily. 09/05/17  Yes Wendall Stade, MD  loratadine (CLARITIN) 10 MG tablet Take 10 mg by mouth daily.   Yes [provider]  losartan (COZAAR) 25 MG tablet Take 1 tablet (25 mg total) by mouth daily. 09/05/17  Yes Wendall Stade, MD  metoprolol tartrate (LOPRESSOR) 50 MG tablet TAKE 1 TABLET BY MOUTH TWO  TIMES DAILY 01/28/17  Yes Wendall Stade, MD  Omega-3 Fatty Acids (FISH OIL) 1000 MG CAPS Take 1 capsule by mouth daily.     Yes [provider]    Physical Exam: Vitals:   11/04/17 0330 11/04/17 0345 11/04/17 0400 11/04/17 0430  BP: (!) 138/91 (!) 156/74 (!) 155/91 (!) 165/80  Pulse:  67  71  Resp: 18 13 11 18   Temp:    98.1 F (36.7 C)  TempSrc:    Oral  SpO2:  96%  98%  Weight:    75.5 kg (166 lb 7.2 oz)  Height:    5\' 11"  (1.803 m)   General: Not in acute distress HEENT:       Eyes: PERRL, EOMI,  no scleral icterus.       ENT: No discharge from the ears and nose, no pharynx injection, no tonsillar enlargement.        Neck: No JVD, no bruit, no mass felt. Heme: No neck lymph node enlargement. Cardiac: S1/S2, RRR, No murmurs, No gallops or rubs. Respiratory: No rales, wheezing, rhonchi or rubs. GI: Soft, nondistended, nontender, no rebound pain, no organomegaly, BS present. GU: No hematuria Ext: No pitting leg edema bilaterally. 2+DP/PT pulse bilaterally. Musculoskeletal: No joint deformities, No joint redness or warmth, no limitation of ROM in spin. Skin: No rashes.  Neuro: Alert, oriented to place and person, but not time, cranial nerves II-XII grossly intact, moves all extremities normally. Muscle strength 4/5 in left leg, 5/5 in all other extremities, sensation to light touch intact. Brachial reflex 2+ bilaterally.  Psych: Patient is not psychotic, no suicidal or hemocidal ideation.  Labs on Admission: I have personally reviewed following labs and imaging studies  CBC: Recent Labs  Lab 11/03/17 2343 11/04/17 0001  WBC 10.2  --   NEUTROABS 7.7  --   HGB 13.6 13.6  HCT 40.5 40.0  MCV 92.5  --   PLT 200  --    Basic Metabolic Panel: Recent Labs  Lab 11/03/17 2343 11/04/17 0001  NA 137 139  K 4.2 4.1  CL 106 106  CO2 20*  --   GLUCOSE 119* 118*  BUN 30* 32*  CREATININE 1.80* 1.80*  CALCIUM 8.7*  --    GFR: Estimated Creatinine Clearance: 37.2 mL/min (A) (by C-G formula based on SCr of 1.8 mg/dL (H)). Liver Function Tests: Recent Labs  Lab 11/03/17 2343  AST 24  ALT 13*  ALKPHOS 96  BILITOT 0.7  PROT 6.5  ALBUMIN 3.6   No results for input(s): LIPASE, AMYLASE in the last 168 hours. No results for input(s): AMMONIA in the last 168 hours. Coagulation Profile: Recent Labs  Lab 11/03/17 2343  INR 1.13   Cardiac Enzymes: No results for input(s): CKTOTAL, CKMB, CKMBINDEX, TROPONINI in the last 168 hours. BNP (last 3 results) No results for input(s):  PROBNP in the last 8760 hours. HbA1C: No results for input(s): HGBA1C in the last 72 hours. CBG: No results for input(s): GLUCAP in the last 168 hours. Lipid Profile: No results for input(s): CHOL, HDL, LDLCALC, TRIG, CHOLHDL, LDLDIRECT in the last 72 hours. Thyroid Function Tests: No results for input(s): TSH, T4TOTAL, FREET4, T3FREE, THYROIDAB in the last 72 hours. Anemia Panel: No results for input(s): VITAMINB12, FOLATE, FERRITIN, TIBC, IRON, RETICCTPCT in the last 72 hours. Urine analysis:    Component Value Date/Time   COLORURINE YELLOW 11/04/2017 0107   APPEARANCEUR TURBID (A) 11/04/2017 0107   LABSPEC 1.038 (H) 11/04/2017 0107   PHURINE 6.0 11/04/2017 0107   GLUCOSEU NEGATIVE 11/04/2017 0107   HGBUR SMALL (A) 11/04/2017 0107   BILIRUBINUR NEGATIVE 11/04/2017 0107   BILIRUBINUR neg 06/28/2014 0931   KETONESUR NEGATIVE 11/04/2017 0107   PROTEINUR 30 (A) 11/04/2017 0107   UROBILINOGEN 1.0 06/28/2014 0931   UROBILINOGEN 1.0 03/19/2013 1142   NITRITE NEGATIVE 11/04/2017 0107   LEUKOCYTESUR LARGE (A) 11/04/2017 0107   Sepsis Labs: @LABRCNTIP (procalcitonin:4,lacticidven:4) )No results found for this or any previous visit (from the past 240 hour(s)).   Radiological Exams on Admission: Ct Angio Head W Or Wo Contrast  Result Date: 11/04/2017 CLINICAL DATA:  77 year old male with left hemianopia. Unknown time of symptom onset. EXAM: CT ANGIOGRAPHY HEAD AND NECK CT PERFUSION BRAIN TECHNIQUE: Multidetector CT imaging of the head and neck was performed using the standard protocol during bolus administration of intravenous contrast. Multiplanar CT image reconstructions and MIPs were obtained to evaluate the vascular anatomy. Carotid stenosis measurements (when applicable) are obtained utilizing NASCET criteria, using the distal internal carotid diameter as the denominator. Multiphase CT imaging of the brain was performed following IV bolus contrast injection. Subsequent parametric  perfusion maps were calculated using RAPID software. CONTRAST:  ISOVUE-370 IOPAMIDOL (ISOVUE-370) INJECTION 76% COMPARISON:  Head CT without contrast 0007 hours today. Brain MRI and MRA 03/19/2013 FINDINGS: CT Brain Perfusion Findings: CBF (<30%) Volume: zero, but erroneous - see next. Perfusion (Tmax>6.0s) volume: 30 mL, which corresponds to the cytotoxic edema seen on the 0007 hours noncontrast head CT today. Therefore, this is infarct core. Mismatch Volume: Probably zero Infarction Location:Right PCA, right occipital  lobe CTA NECK Skeleton: Prior sternotomy. No acute osseous abnormality identified. Upper chest: Mild dependent atelectasis. No superior mediastinal lymphadenopathy. Mild gas and fluid distension of the thoracic esophagus. Other neck: Negative.  No neck mass or lymphadenopathy. Aortic arch: Mild bovine type arch configuration. Calcified arch atherosclerosis. Right carotid system: Tortuous brachiocephalic artery and proximal right CCA with a kinked appearance but no atherosclerotic stenosis. Soft and calcified plaque in the distal right CCA proximal to the bifurcation without stenosis. Bulky calcified plaque at the right carotid bifurcation, but less than 50 % stenosis with respect to the distal vessel at the right ICA origin. Coarse calcified plaque continues into the distal bulb, but stenosis remains less than 50 % with respect to the distal vessel. Patent right ICA to the skull base. Left carotid system: No left CCA origin stenosis. Mildly tortuous proximal left CCA. Occasional plaque proximal to the left carotid bifurcation without stenosis. Bulky calcified plaque at the left carotid bifurcation with short segment radiographic string sign stenosis (series 10, image 112). The left ICA remains patent with no additional stenosis to the skull base despite some calcified plaque. Vertebral arteries: Tortuous proximal right subclavian artery without atherosclerotic stenosis. Right vertebral artery  origin calcified plaque with moderate to severe stenosis. The right vertebral artery is non dominant with additional right V2 and V3 segment calcified plaque, but no other stenosis until the skull base. No proximal right subclavian artery stenosis despite plaque. Calcified plaque at the left vertebral artery origin with moderate to severe stenosis. Dominant left vertebral artery with additional calcified plaque in the V2 segments but no other stenosis to the skull base. CTA HEAD Posterior circulation: Calcified plaque in the right vertebral artery V4 segment with moderate to severe stenosis as the vessel crosses the dura and again just proximal to the right PICA origin which remains patent. This appears progressed since 2014. The right vertebral is patent to the vertebrobasilar junction. The dominant distal left vertebral artery demonstrates calcified plaque with only mild stenosis. Normal left PICA origin. Patent basilar artery with stable irregularity since 2014 mostly in the proximal 3rd. Mild associated stenosis. Patent SCA and PCA origins. Posterior communicating arteries are diminutive or absent. The right P1 and P2 segments are patent with mild irregularity. There is poor flow in the right P3 segments. The left PCA is patent with mild irregularity. Anterior circulation: Both ICA siphons are patent with extensive calcified atherosclerosis. There is moderate to severe short segment supraclinoid right ICA stenosis which may have progressed since 2014 (series 11 image 78). The right ICA terminus remains patent. On the left there is moderate distal petrous segment stenosis due to calcified plaque. 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 There is moderate to severe anterior genu segment stenosis due to calcified plaque, and additional severe left supraclinoid segment stenosis due to calcified plaque. These appear progressed since 2014. The left ICA terminus remains patent. MCA  and ACA origins are patent. ACA origins and A1 segments appear normal. Bilateral ACA branches are within normal limits. There is calcified plaque at the left MCA origin but no stenosis. The left M1 segment, left trifurcation, and left MCA branches are within normal limits. There is calcified plaque in the right MCA origin and M1 segment with only mild stenosis. The right MCA bifurcation and right MCA branches are patent with mild irregularity. No discrete right MCA branch occlusion is identified although there is decreased distal right MCA branch enhancement compared to the left side (series 12,  image 13). Venous sinuses: Grossly patent, suboptimal timing for venous valuation. Anatomic variants: Dominant left vertebral artery. Mild bovine type arch configuration. Review of the MIP images confirms the above findings IMPRESSION: 1. Unreliable CBF results due to cytotoxic edema in the Right PCA territory. 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 This was discussed by telephone with Dr. Ritta Slot on 11/04/2017 at 1229 hours. 2. With regard to #1, there is poor flow in the right P3 divisions, but the right P1 and P2 segments remain patent. 3. Positive for severe atherosclerosis and multifocal other high-grade intra- and extracranial stenosis: - high-grade Left ICA origin radiographic string sign stenosis due to bulky calcified plaque. - moderate to severe bilateral ICA siphon stenosis appears progressed since 2014 and is due to calcified plaque, with multifocal high-grade stenoses on the Left . - moderate to severe stenosis of the non dominant Right Vertebral Artery at both its origin and V4 segments. 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 4.  Aortic Atherosclerosis (ICD10-I70.0). Electronically Signed   By: Odessa Fleming M.D.   On: 11/04/2017 00:52   Ct Angio Neck W Or Wo  Contrast  Result Date: 11/04/2017 CLINICAL DATA:  77 year old male with left hemianopia. Unknown time of symptom onset. EXAM: CT ANGIOGRAPHY HEAD AND NECK CT PERFUSION BRAIN TECHNIQUE: Multidetector CT imaging of the head and neck was performed using the standard protocol during bolus administration of intravenous contrast. Multiplanar CT image reconstructions and MIPs were obtained to evaluate the vascular anatomy. Carotid stenosis measurements (when applicable) are obtained utilizing NASCET criteria, using the distal internal carotid diameter as the denominator. Multiphase CT imaging of the brain was performed following IV bolus contrast injection. Subsequent parametric perfusion maps were calculated using RAPID software. CONTRAST:  ISOVUE-370 IOPAMIDOL (ISOVUE-370) INJECTION 76% COMPARISON:  Head CT without contrast 0007 hours today. Brain MRI and MRA 03/19/2013 FINDINGS: CT Brain Perfusion Findings: CBF (<30%) Volume: zero, but erroneous - see next. Perfusion (Tmax>6.0s) volume: 30 mL, which corresponds to the cytotoxic edema seen on the 0007 hours noncontrast head CT today. Therefore, this is infarct core. Mismatch Volume: Probably zero Infarction Location:Right PCA, right occipital lobe CTA NECK Skeleton: Prior sternotomy. No acute osseous abnormality identified. Upper chest: Mild dependent atelectasis. No superior mediastinal lymphadenopathy. Mild gas and fluid distension of the thoracic esophagus. Other neck: Negative.  No neck mass or lymphadenopathy. Aortic arch: Mild bovine type arch configuration. Calcified arch atherosclerosis. Right carotid system: Tortuous brachiocephalic artery and proximal right CCA with a kinked appearance but no atherosclerotic stenosis. Soft and calcified plaque in the distal right CCA proximal to the bifurcation without stenosis. Bulky calcified plaque at the right carotid bifurcation, but less than 50 % stenosis with respect to the distal vessel at the right ICA origin.  Coarse calcified plaque continues into the distal bulb, but stenosis remains less than 50 % with respect to the distal vessel. Patent right ICA to the skull base. Left carotid system: No left CCA origin stenosis. Mildly tortuous proximal left CCA. Occasional plaque proximal to the left carotid bifurcation without stenosis. Bulky calcified plaque at the left carotid bifurcation with short segment radiographic string sign stenosis (series 10, image 112). The left ICA remains patent with no additional stenosis to the skull base despite some calcified plaque. Vertebral arteries: Tortuous proximal right subclavian artery without atherosclerotic stenosis. Right vertebral artery origin calcified plaque with  moderate to severe stenosis. The right vertebral artery is non dominant with additional right V2 and V3 segment calcified plaque, but no other stenosis until the skull base. No proximal right subclavian artery stenosis despite plaque. Calcified plaque at the left vertebral artery origin with moderate to severe stenosis. Dominant left vertebral artery with additional calcified plaque in the V2 segments but no other stenosis to the skull base. CTA HEAD Posterior circulation: Calcified plaque in the right vertebral artery V4 segment with moderate to severe stenosis as the vessel crosses the dura and again just proximal to the right PICA origin which remains patent. This appears progressed since 2014. The right vertebral is patent to the vertebrobasilar junction. The dominant distal left vertebral artery demonstrates calcified plaque with only mild stenosis. Normal left PICA origin. Patent basilar artery with stable irregularity since 2014 mostly in the proximal 3rd. Mild associated stenosis. Patent SCA and PCA origins. Posterior communicating arteries are diminutive or absent. The right P1 and P2 segments are patent with mild irregularity. There is poor flow in the right P3 segments. The left PCA is patent with mild  irregularity. Anterior circulation: Both ICA siphons are patent with extensive calcified atherosclerosis. There is moderate to severe short segment supraclinoid right ICA stenosis which may have progressed since 2014 (series 11 image 78). The right ICA terminus remains patent. On the left there is moderate distal petrous segment stenosis due to calcified plaque. 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 There is moderate to severe anterior genu segment stenosis due to calcified plaque, and additional severe left supraclinoid segment stenosis due to calcified plaque. These appear progressed since 2014. The left ICA terminus remains patent. MCA and ACA origins are patent. ACA origins and A1 segments appear normal. Bilateral ACA branches are within normal limits. There is calcified plaque at the left MCA origin but no stenosis. The left M1 segment, left trifurcation, and left MCA branches are within normal limits. There is calcified plaque in the right MCA origin and M1 segment with only mild stenosis. The right MCA bifurcation and right MCA branches are patent with mild irregularity. No discrete right MCA branch occlusion is identified although there is decreased distal right MCA branch enhancement compared to the left side (series 12, image 13). Venous sinuses: Grossly patent, suboptimal timing for venous valuation. Anatomic variants: Dominant left vertebral artery. Mild bovine type arch configuration. Review of the MIP images confirms the above findings IMPRESSION: 1. Unreliable CBF results due to cytotoxic edema in the Right PCA territory. 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 This was discussed by telephone with Dr. Ritta Slot on 11/04/2017 at 1229 hours. 2. With regard to #1, there is poor flow in the right P3 divisions, but the right P1 and P2 segments remain  patent. 3. Positive for severe atherosclerosis and multifocal other high-grade intra- and extracranial stenosis: - high-grade Left ICA origin radiographic string sign stenosis due to bulky calcified plaque. - moderate to severe bilateral ICA siphon stenosis appears progressed since 2014 and is due to calcified plaque, with multifocal high-grade stenoses on the Left . - moderate to severe stenosis of the non dominant Right Vertebral Artery at both its origin and V4 segments. 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 4.  Aortic Atherosclerosis (ICD10-I70.0). Electronically Signed   By: Odessa Fleming M.D.   On: 11/04/2017 00:52  Ct Cerebral Perfusion W Contrast  Result Date: 11/04/2017 CLINICAL DATA:  77 year old male with left hemianopia. Unknown time of symptom onset. EXAM: CT ANGIOGRAPHY HEAD AND NECK CT PERFUSION BRAIN TECHNIQUE: Multidetector CT imaging of the head and neck was performed using the standard protocol during bolus administration of intravenous contrast. Multiplanar CT image reconstructions and MIPs were obtained to evaluate the vascular anatomy. Carotid stenosis measurements (when applicable) are obtained utilizing NASCET criteria, using the distal internal carotid diameter as the denominator. Multiphase CT imaging of the brain was performed following IV bolus contrast injection. Subsequent parametric perfusion maps were calculated using RAPID software. CONTRAST:  ISOVUE-370 IOPAMIDOL (ISOVUE-370) INJECTION 76% COMPARISON:  Head CT without contrast 0007 hours today. Brain MRI and MRA 03/19/2013 FINDINGS: CT Brain Perfusion Findings: CBF (<30%) Volume: zero, but erroneous - see next. Perfusion (Tmax>6.0s) volume: 30 mL, which corresponds to the cytotoxic edema seen on the 0007 hours noncontrast head CT today. Therefore, this is infarct core. Mismatch Volume: Probably zero Infarction Location:Right PCA, right occipital lobe CTA NECK Skeleton: Prior sternotomy. No acute  osseous abnormality identified. Upper chest: Mild dependent atelectasis. No superior mediastinal lymphadenopathy. Mild gas and fluid distension of the thoracic esophagus. Other neck: Negative.  No neck mass or lymphadenopathy. Aortic arch: Mild bovine type arch configuration. Calcified arch atherosclerosis. Right carotid system: Tortuous brachiocephalic artery and proximal right CCA with a kinked appearance but no atherosclerotic stenosis. Soft and calcified plaque in the distal right CCA proximal to the bifurcation without stenosis. Bulky calcified plaque at the right carotid bifurcation, but less than 50 % stenosis with respect to the distal vessel at the right ICA origin. Coarse calcified plaque continues into the distal bulb, but stenosis remains less than 50 % with respect to the distal vessel. Patent right ICA to the skull base. Left carotid system: No left CCA origin stenosis. Mildly tortuous proximal left CCA. Occasional plaque proximal to the left carotid bifurcation without stenosis. Bulky calcified plaque at the left carotid bifurcation with short segment radiographic string sign stenosis (series 10, image 112). The left ICA remains patent with no additional stenosis to the skull base despite some calcified plaque. Vertebral arteries: Tortuous proximal right subclavian artery without atherosclerotic stenosis. Right vertebral artery origin calcified plaque with moderate to severe stenosis. The right vertebral artery is non dominant with additional right V2 and V3 segment calcified plaque, but no other stenosis until the skull base. No proximal right subclavian artery stenosis despite plaque. Calcified plaque at the left vertebral artery origin with moderate to severe stenosis. Dominant left vertebral artery with additional calcified plaque in the V2 segments but no other stenosis to the skull base. CTA HEAD Posterior circulation: Calcified plaque in the right vertebral artery V4 segment with moderate to  severe stenosis as the vessel crosses the dura and again just proximal to the right PICA origin which remains patent. This appears progressed since 2014. The right vertebral is patent to the vertebrobasilar junction. The dominant distal left vertebral artery demonstrates calcified plaque with only mild stenosis. Normal left PICA origin. Patent basilar artery with stable irregularity since 2014 mostly in the proximal 3rd. Mild associated stenosis. Patent SCA and PCA origins. Posterior communicating arteries are diminutive or absent. The right P1 and P2 segments are patent with mild irregularity. There is poor flow in the right P3 segments. The left PCA is patent with mild irregularity. Anterior circulation: Both ICA siphons are patent with extensive calcified atherosclerosis. There is moderate to severe short segment supraclinoid right  ICA stenosis which may have progressed since 2014 (series 11 image 78). The right ICA terminus remains patent. On the left there is moderate distal petrous segment stenosis due to calcified plaque. 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 There is moderate to severe anterior genu segment stenosis due to calcified plaque, and additional severe left supraclinoid segment stenosis due to calcified plaque. These appear progressed since 2014. The left ICA terminus remains patent. MCA and ACA origins are patent. ACA origins and A1 segments appear normal. Bilateral ACA branches are within normal limits. There is calcified plaque at the left MCA origin but no stenosis. The left M1 segment, left trifurcation, and left MCA branches are within normal limits. There is calcified plaque in the right MCA origin and M1 segment with only mild stenosis. The right MCA bifurcation and right MCA branches are patent with mild irregularity. No discrete right MCA branch occlusion is identified although there is decreased distal right MCA branch enhancement compared to the  left side (series 12, image 13). Venous sinuses: Grossly patent, suboptimal timing for venous valuation. Anatomic variants: Dominant left vertebral artery. Mild bovine type arch configuration. Review of the MIP images confirms the above findings IMPRESSION: 1. Unreliable CBF results due to cytotoxic edema in the Right PCA territory. 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 This was discussed by telephone with Dr. Ritta Slot on 11/04/2017 at 1229 hours. 2. With regard to #1, there is poor flow in the right P3 divisions, but the right P1 and P2 segments remain patent. 3. Positive for severe atherosclerosis and multifocal other high-grade intra- and extracranial stenosis: - high-grade Left ICA origin radiographic string sign stenosis due to bulky calcified plaque. - moderate to severe bilateral ICA siphon stenosis appears progressed since 2014 and is due to calcified plaque, with multifocal high-grade stenoses on the Left . - moderate to severe stenosis of the non dominant Right Vertebral Artery at both its origin and V4 segments. 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 4.  Aortic Atherosclerosis (ICD10-I70.0). Electronically Signed   By: Odessa Fleming M.D.   On: 11/04/2017 00:52   Ct Head Code Stroke Wo Contrast  Result Date: 11/04/2017 CLINICAL DATA:  Code stroke. 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 Last known normal 1900 hours. EXAM: CT HEAD WITHOUT CONTRAST TECHNIQUE: Contiguous axial images were obtained from the base of the skull through the vertex without intravenous contrast. COMPARISON:  Brain MRI, intracranial MRA, and noncontrast head CT 03/19/2013 FINDINGS: Brain: Similar cerebral volume since 2014. 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 No cortical encephalomalacia identified, but there is abnormal  hypodensity in the medial right occipital lobe on series 3, image 15 and coronal image 47. No associated hemorrhage or mass effect. No other cytotoxic edema identified. No midline shift, ventriculomegaly, mass effect, evidence of mass lesion, or acute intracranial hemorrhage. Vascular: Extensive calcified atherosclerosis at the skull base. Questionable abnormal hyperdensity of the right PCA on series 5, image 42. Skull: No acute osseous abnormality identified. Sinuses/Orbits: Widespread paranasal sinus mucosal thickening and fluid levels are largely new since 2014. The bilateral tympanic cavities and mastoids remain clear. Other: No acute orbit or scalp soft tissue findings. Orbits soft tissues appears stable since 2014. ASPECTS [facility] Stroke Program Early CT Score) - Ganglionic level infarction (caudate, lentiform nuclei, internal capsule, insula, M1-M3 cortex):  7 - Supraganglionic infarction (M4-M6 cortex): 3 Total score (0-10 with 10 being normal): 10 IMPRESSION: 1. 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 2. Suspicion of hyperdense right PCA indicating occlusion. 3. Underlying advanced chronic small vessel disease. 4. These results were communicated to Dr. Amada Jupiter at 12:22 amon 3/25/2019by text page via the [facility] messaging system. Electronically Signed   By: Odessa Fleming M.D.   On: 11/04/2017 00:23     EKG:  Not done in ED, will get one.   Assessment/Plan Principal Problem:   Stroke [facility]) Active Problems:   Essential hypertension   Coronary atherosclerosis   CAD, ARTERY BYPASS GRAFT   HLD (hyperlipidemia)   Chronic diastolic CHF (congestive heart failure) (HCC)   Acute renal failure superimposed on stage 2 chronic kidney disease (HCC)   Acute metabolic encephalopathy   UTI (urinary tract infection)   Stroke [facility]): neurology was consulted, Dr. Amada Jupiter event the patient, and recommended for further stroke  workup. -will place on tele bed for obs - highly appreciate Dr. Alene Mires consultation, will follow up recommendations as follows:  Recommendations  1. HgbA1c, fasting lipid panel  2. MRI of the brain without contrast  3. Frequent neuro checks  4. Echocardiogram  5. Prophylactic therapy-Antiplatelet med: Aspirin - dose 325mg  PO or 300mg  PR (pt was on plavix at home)  6. Risk factor modification  7. Telemetry monitoring  8. PT consult, OT consult, Speech consult  Essential hypertension: -hold HCTZ, metoprolol and cozarr to allow permissive hypertension - IV hydralazine when necessary for SBP>220  CAD: s/p of CBAG. No CP -ASA, lipitor -increased lipitor dose from 40 to 80 mg daily  HLD (hyperlipidemia): -Lipitor  Chronic diastolic CHF (congestive heart failure) (HCC): 2-D echo on 10/14/17 showed EF of 55-60%. Patient does not have leg edema JVD. CHF is compensated. -check BNP  AoCKD-II: Baseline Cre is 1.3 on 11/15/16, pt's Cre is 1.80 and bun 32 on admission. Likely due to prerenal secondary to dehydration and continuation of ACEI diuretics, - IVF as above - Check FeUrea - Follow up renal function by BMP - prinzide on hold  Acute metabolic encephalopathy: due to stroke -frequent neuro check -f/u UA  Addendum: UA came back positive with large amount of leukocyte, many bacterial and TMTC WBC -rocephin -f/u Bx and Ux   DVT ppx: SQ Lovenox Code Status: Full code Family Communication: None at bed side.    Disposition Plan:  Anticipate discharge back to previous home environment Consults called:  Neuro, Dr. Amada Jupiter Admission status: Obs / tele        Date of Service 11/04/2017    Lorretta Harp [facility] Pager [phone]  If 7PM-7AM, please contact night-coverage [email or web address] Password [facility] 11/04/2017, 4:56 AM

## 2017-11-04 NOTE — Progress Notes (Addendum)
STROKE TEAM PROGRESS NOTE   SUBJECTIVE (INTERVAL HISTORY) His wife is at the bedside.  Overall he feels his condition is unchanged. 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 LKW was around [employer] basketball game last night around 8:00pm to 8:30pm. After the game, he and his wife noticed he was not able to see on the left side of his visual field.    OBJECTIVE Temp:  [98.1 F (36.7 C)-98.8 F (37.1 C)] 98.3 F (36.8 C) (03/25 0824) Pulse Rate:  [65-71] 67 (03/25 0824) Cardiac Rhythm: Normal sinus rhythm (03/25 0700) Resp:  [11-18] 18 (03/25 0824) BP: (136-165)/(68-91) 153/72 (03/25 0824) SpO2:  [93 %-99 %] 97 % (03/25 0824) Weight:  [166 lb 7.2 oz (75.5 kg)-171 lb 11.8 oz (77.9 kg)] 166 lb 7.2 oz (75.5 kg) (03/25 0430)  No results for input(s): GLUCAP in the last 168 hours. Recent Labs  Lab 11/03/17 2343 11/04/17 0001  NA 137 139  K 4.2 4.1  CL 106 106  CO2 20*  --   GLUCOSE 119* 118*  BUN 30* 32*  CREATININE 1.80* 1.80*  CALCIUM 8.7*  --    Recent Labs  Lab 11/03/17 2343  AST 24  ALT 13*  ALKPHOS 96  BILITOT 0.7  PROT 6.5  ALBUMIN 3.6   Recent Labs  Lab 11/03/17 2343 11/04/17 0001  WBC 10.2  --   NEUTROABS 7.7  --   HGB 13.6 13.6  HCT 40.5 40.0  MCV 92.5  --   PLT 200  --    No results for input(s): CKTOTAL, CKMB, CKMBINDEX, TROPONINI in the last 168 hours. Recent Labs    11/03/17 2343  LABPROT 14.4  INR 1.13   Recent Labs    11/04/17 0107  COLORURINE YELLOW  LABSPEC 1.038*  PHURINE 6.0  GLUCOSEU NEGATIVE  HGBUR SMALL*  BILIRUBINUR NEGATIVE  KETONESUR NEGATIVE  PROTEINUR 30*  NITRITE NEGATIVE  LEUKOCYTESUR LARGE*       Component Value Date/Time   CHOL 138 11/04/2017 0539   TRIG 80 11/04/2017 0539   HDL 42 11/04/2017 0539   CHOLHDL 3.3 11/04/2017 0539   VLDL 16 11/04/2017 0539   LDLCALC 80 11/04/2017 0539   Lab Results  Component Value Date   HGBA1C 5.8 (H) 11/04/2017      Component Value Date/Time   LABOPIA NONE  DETECTED 11/04/2017 0107   COCAINSCRNUR NONE DETECTED 11/04/2017 0107   LABBENZ NONE DETECTED 11/04/2017 0107   AMPHETMU NONE DETECTED 11/04/2017 0107   THCU NONE DETECTED 11/04/2017 0107   LABBARB NONE DETECTED 11/04/2017 0107    Recent Labs  Lab 11/03/17 2343  ETH <10    I have personally reviewed the radiological images below and agree with the radiology interpretations.  Ct Angio Head W Or Wo Contrast  Result Date: 11/04/2017 CLINICAL DATA:  77 year old male with left hemianopia. Unknown time of symptom onset. EXAM: CT ANGIOGRAPHY HEAD AND NECK CT PERFUSION BRAIN TECHNIQUE: Multidetector CT imaging of the head and neck was performed using the standard protocol during bolus administration of intravenous contrast. Multiplanar CT image reconstructions and MIPs were obtained to evaluate the vascular anatomy. Carotid stenosis measurements (when applicable) are obtained utilizing NASCET criteria, using the distal internal carotid diameter as the denominator. Multiphase CT imaging of the brain was performed following IV bolus contrast injection. Subsequent parametric perfusion maps were calculated using RAPID software. CONTRAST:  ISOVUE-370 IOPAMIDOL (ISOVUE-370) INJECTION 76% COMPARISON:  Head CT without contrast 0007 hours today. Brain MRI and MRA 03/19/2013 FINDINGS:  CT Brain Perfusion Findings: CBF (<30%) Volume: zero, but erroneous - see next. Perfusion (Tmax>6.0s) volume: 30 mL, which corresponds to the cytotoxic edema seen on the 0007 hours noncontrast head CT today. Therefore, this is infarct core. Mismatch Volume: Probably zero Infarction Location:Right PCA, right occipital lobe CTA NECK Skeleton: Prior sternotomy. No acute osseous abnormality identified. Upper chest: Mild dependent atelectasis. No superior mediastinal lymphadenopathy. Mild gas and fluid distension of the thoracic esophagus. Other neck: Negative.  No neck mass or lymphadenopathy. Aortic arch: Mild bovine type arch  configuration. Calcified arch atherosclerosis. Right carotid system: Tortuous brachiocephalic artery and proximal right CCA with a kinked appearance but no atherosclerotic stenosis. Soft and calcified plaque in the distal right CCA proximal to the bifurcation without stenosis. Bulky calcified plaque at the right carotid bifurcation, but less than 50 % stenosis with respect to the distal vessel at the right ICA origin. Coarse calcified plaque continues into the distal bulb, but stenosis remains less than 50 % with respect to the distal vessel. Patent right ICA to the skull base. Left carotid system: No left CCA origin stenosis. Mildly tortuous proximal left CCA. Occasional plaque proximal to the left carotid bifurcation without stenosis. Bulky calcified plaque at the left carotid bifurcation with short segment radiographic string sign stenosis (series 10, image 112). The left ICA remains patent with no additional stenosis to the skull base despite some calcified plaque. Vertebral arteries: Tortuous proximal right subclavian artery without atherosclerotic stenosis. Right vertebral artery origin calcified plaque with moderate to severe stenosis. The right vertebral artery is non dominant with additional right V2 and V3 segment calcified plaque, but no other stenosis until the skull base. No proximal right subclavian artery stenosis despite plaque. Calcified plaque at the left vertebral artery origin with moderate to severe stenosis. Dominant left vertebral artery with additional calcified plaque in the V2 segments but no other stenosis to the skull base. CTA HEAD Posterior circulation: Calcified plaque in the right vertebral artery V4 segment with moderate to severe stenosis as the vessel crosses the dura and again just proximal to the right PICA origin which remains patent. This appears progressed since 2014. The right vertebral is patent to the vertebrobasilar junction. The dominant distal left vertebral artery  demonstrates calcified plaque with only mild stenosis. Normal left PICA origin. Patent basilar artery with stable irregularity since 2014 mostly in the proximal 3rd. Mild associated stenosis. Patent SCA and PCA origins. Posterior communicating arteries are diminutive or absent. The right P1 and P2 segments are patent with mild irregularity. There is poor flow in the right P3 segments. The left PCA is patent with mild irregularity. Anterior circulation: Both ICA siphons are patent with extensive calcified atherosclerosis. There is moderate to severe short segment supraclinoid right ICA stenosis which may have progressed since 2014 (series 11 image 78). The right ICA terminus remains patent. On the left there is moderate distal petrous segment stenosis due to calcified plaque. 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 There is moderate to severe anterior genu segment stenosis due to calcified plaque, and additional severe left supraclinoid segment stenosis due to calcified plaque. These appear progressed since 2014. The left ICA terminus remains patent. MCA and ACA origins are patent. ACA origins and A1 segments appear normal. Bilateral ACA branches are within normal limits. There is calcified plaque at the left MCA origin but no stenosis. The left M1 segment, left trifurcation, and left MCA branches are within normal limits. There is calcified plaque in the  right MCA origin and M1 segment with only mild stenosis. The right MCA bifurcation and right MCA branches are patent with mild irregularity. No discrete right MCA branch occlusion is identified although there is decreased distal right MCA branch enhancement compared to the left side (series 12, image 13). Venous sinuses: Grossly patent, suboptimal timing for venous valuation. Anatomic variants: Dominant left vertebral artery. Mild bovine type arch configuration. Review of the MIP images confirms the above findings IMPRESSION: 1.  Unreliable CBF results due to cytotoxic edema in the Right PCA territory. 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 This was discussed by telephone with Dr. Ritta Slot on 11/04/2017 at 1229 hours. 2. With regard to #1, there is poor flow in the right P3 divisions, but the right P1 and P2 segments remain patent. 3. Positive for severe atherosclerosis and multifocal other high-grade intra- and extracranial stenosis: - high-grade Left ICA origin radiographic string sign stenosis due to bulky calcified plaque. - moderate to severe bilateral ICA siphon stenosis appears progressed since 2014 and is due to calcified plaque, with multifocal high-grade stenoses on the Left . - moderate to severe stenosis of the non dominant Right Vertebral Artery at both its origin and V4 segments. 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 4.  Aortic Atherosclerosis (ICD10-I70.0). Electronically Signed   By: Odessa Fleming M.D.   On: 11/04/2017 00:52   Ct Angio Neck W Or Wo Contrast  Result Date: 11/04/2017 CLINICAL DATA:  77 year old male with left hemianopia. Unknown time of symptom onset. EXAM: CT ANGIOGRAPHY HEAD AND NECK CT PERFUSION BRAIN TECHNIQUE: Multidetector CT imaging of the head and neck was performed using the standard protocol during bolus administration of intravenous contrast. Multiplanar CT image reconstructions and MIPs were obtained to evaluate the vascular anatomy. Carotid stenosis measurements (when applicable) are obtained utilizing NASCET criteria, using the distal internal carotid diameter as the denominator. Multiphase CT imaging of the brain was performed following IV bolus contrast injection. Subsequent parametric perfusion maps were calculated using RAPID software. CONTRAST:  ISOVUE-370 IOPAMIDOL (ISOVUE-370) INJECTION 76% COMPARISON:  Head CT without contrast 0007 hours  today. Brain MRI and MRA 03/19/2013 FINDINGS: CT Brain Perfusion Findings: CBF (<30%) Volume: zero, but erroneous - see next. Perfusion (Tmax>6.0s) volume: 30 mL, which corresponds to the cytotoxic edema seen on the 0007 hours noncontrast head CT today. Therefore, this is infarct core. Mismatch Volume: Probably zero Infarction Location:Right PCA, right occipital lobe CTA NECK Skeleton: Prior sternotomy. No acute osseous abnormality identified. Upper chest: Mild dependent atelectasis. No superior mediastinal lymphadenopathy. Mild gas and fluid distension of the thoracic esophagus. Other neck: Negative.  No neck mass or lymphadenopathy. Aortic arch: Mild bovine type arch configuration. Calcified arch atherosclerosis. Right carotid system: Tortuous brachiocephalic artery and proximal right CCA with a kinked appearance but no atherosclerotic stenosis. Soft and calcified plaque in the distal right CCA proximal to the bifurcation without stenosis. Bulky calcified plaque at the right carotid bifurcation, but less than 50 % stenosis with respect to the distal vessel at the right ICA origin. Coarse calcified plaque continues into the distal bulb, but stenosis remains less than 50 % with respect to the distal vessel. Patent right ICA to the skull base. Left carotid system: No left CCA origin stenosis. Mildly tortuous proximal left CCA. Occasional plaque proximal to the left carotid bifurcation without stenosis. Bulky calcified plaque at the left  carotid bifurcation with short segment radiographic string sign stenosis (series 10, image 112). The left ICA remains patent with no additional stenosis to the skull base despite some calcified plaque. Vertebral arteries: Tortuous proximal right subclavian artery without atherosclerotic stenosis. Right vertebral artery origin calcified plaque with moderate to severe stenosis. The right vertebral artery is non dominant with additional right V2 and V3 segment calcified plaque, but no  other stenosis until the skull base. No proximal right subclavian artery stenosis despite plaque. Calcified plaque at the left vertebral artery origin with moderate to severe stenosis. Dominant left vertebral artery with additional calcified plaque in the V2 segments but no other stenosis to the skull base. CTA HEAD Posterior circulation: Calcified plaque in the right vertebral artery V4 segment with moderate to severe stenosis as the vessel crosses the dura and again just proximal to the right PICA origin which remains patent. This appears progressed since 2014. The right vertebral is patent to the vertebrobasilar junction. The dominant distal left vertebral artery demonstrates calcified plaque with only mild stenosis. Normal left PICA origin. Patent basilar artery with stable irregularity since 2014 mostly in the proximal 3rd. Mild associated stenosis. Patent SCA and PCA origins. Posterior communicating arteries are diminutive or absent. The right P1 and P2 segments are patent with mild irregularity. There is poor flow in the right P3 segments. The left PCA is patent with mild irregularity. Anterior circulation: Both ICA siphons are patent with extensive calcified atherosclerosis. There is moderate to severe short segment supraclinoid right ICA stenosis which may have progressed since 2014 (series 11 image 78). The right ICA terminus remains patent. On the left there is moderate distal petrous segment stenosis due to calcified plaque. 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 There is moderate to severe anterior genu segment stenosis due to calcified plaque, and additional severe left supraclinoid segment stenosis due to calcified plaque. These appear progressed since 2014. The left ICA terminus remains patent. MCA and ACA origins are patent. ACA origins and A1 segments appear normal. Bilateral ACA branches are within normal limits. There is calcified plaque at the left MCA  origin but no stenosis. The left M1 segment, left trifurcation, and left MCA branches are within normal limits. There is calcified plaque in the right MCA origin and M1 segment with only mild stenosis. The right MCA bifurcation and right MCA branches are patent with mild irregularity. No discrete right MCA branch occlusion is identified although there is decreased distal right MCA branch enhancement compared to the left side (series 12, image 13). Venous sinuses: Grossly patent, suboptimal timing for venous valuation. Anatomic variants: Dominant left vertebral artery. Mild bovine type arch configuration. Review of the MIP images confirms the above findings IMPRESSION: 1. Unreliable CBF results due to cytotoxic edema in the Right PCA territory. 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 This was discussed by telephone with Dr. Ritta Slot on 11/04/2017 at 1229 hours. 2. With regard to #1, there is poor flow in the right P3 divisions, but the right P1 and P2 segments remain patent. 3. Positive for severe atherosclerosis and multifocal other high-grade intra- and extracranial stenosis: - high-grade Left ICA origin radiographic string sign stenosis due to bulky calcified plaque. - moderate to severe bilateral ICA siphon stenosis appears progressed since 2014 and is due to calcified plaque, with multifocal high-grade stenoses on the Left . - moderate to  severe stenosis of the non dominant Right Vertebral Artery at both its origin and V4 segments. 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 4.  Aortic Atherosclerosis (ICD10-I70.0). Electronically Signed   By: Odessa Fleming M.D.   On: 11/04/2017 00:52   Ct Cerebral Perfusion W Contrast  Result Date: 11/04/2017 CLINICAL DATA:  77 year old male with left hemianopia. Unknown time of symptom onset. EXAM: CT ANGIOGRAPHY HEAD AND NECK CT PERFUSION  BRAIN TECHNIQUE: Multidetector CT imaging of the head and neck was performed using the standard protocol during bolus administration of intravenous contrast. Multiplanar CT image reconstructions and MIPs were obtained to evaluate the vascular anatomy. Carotid stenosis measurements (when applicable) are obtained utilizing NASCET criteria, using the distal internal carotid diameter as the denominator. Multiphase CT imaging of the brain was performed following IV bolus contrast injection. Subsequent parametric perfusion maps were calculated using RAPID software. CONTRAST:  ISOVUE-370 IOPAMIDOL (ISOVUE-370) INJECTION 76% COMPARISON:  Head CT without contrast 0007 hours today. Brain MRI and MRA 03/19/2013 FINDINGS: CT Brain Perfusion Findings: CBF (<30%) Volume: zero, but erroneous - see next. Perfusion (Tmax>6.0s) volume: 30 mL, which corresponds to the cytotoxic edema seen on the 0007 hours noncontrast head CT today. Therefore, this is infarct core. Mismatch Volume: Probably zero Infarction Location:Right PCA, right occipital lobe CTA NECK Skeleton: Prior sternotomy. No acute osseous abnormality identified. Upper chest: Mild dependent atelectasis. No superior mediastinal lymphadenopathy. Mild gas and fluid distension of the thoracic esophagus. Other neck: Negative.  No neck mass or lymphadenopathy. Aortic arch: Mild bovine type arch configuration. Calcified arch atherosclerosis. Right carotid system: Tortuous brachiocephalic artery and proximal right CCA with a kinked appearance but no atherosclerotic stenosis. Soft and calcified plaque in the distal right CCA proximal to the bifurcation without stenosis. Bulky calcified plaque at the right carotid bifurcation, but less than 50 % stenosis with respect to the distal vessel at the right ICA origin. Coarse calcified plaque continues into the distal bulb, but stenosis remains less than 50 % with respect to the distal vessel. Patent right ICA to the skull base. Left  carotid system: No left CCA origin stenosis. Mildly tortuous proximal left CCA. Occasional plaque proximal to the left carotid bifurcation without stenosis. Bulky calcified plaque at the left carotid bifurcation with short segment radiographic string sign stenosis (series 10, image 112). The left ICA remains patent with no additional stenosis to the skull base despite some calcified plaque. Vertebral arteries: Tortuous proximal right subclavian artery without atherosclerotic stenosis. Right vertebral artery origin calcified plaque with moderate to severe stenosis. The right vertebral artery is non dominant with additional right V2 and V3 segment calcified plaque, but no other stenosis until the skull base. No proximal right subclavian artery stenosis despite plaque. Calcified plaque at the left vertebral artery origin with moderate to severe stenosis. Dominant left vertebral artery with additional calcified plaque in the V2 segments but no other stenosis to the skull base. CTA HEAD Posterior circulation: Calcified plaque in the right vertebral artery V4 segment with moderate to severe stenosis as the vessel crosses the dura and again just proximal to the right PICA origin which remains patent. This appears progressed since 2014. The right vertebral is patent to the vertebrobasilar junction. The dominant distal left vertebral artery demonstrates calcified plaque with only mild stenosis. Normal left PICA origin. Patent basilar artery with stable irregularity since 2014 mostly in the proximal 3rd. Mild associated stenosis. Patent SCA and PCA origins. Posterior communicating arteries are diminutive or absent. The  right P1 and P2 segments are patent with mild irregularity. There is poor flow in the right P3 segments. The left PCA is patent with mild irregularity. Anterior circulation: Both ICA siphons are patent with extensive calcified atherosclerosis. There is moderate to severe short segment supraclinoid right ICA  stenosis which may have progressed since 2014 (series 11 image 78). The right ICA terminus remains patent. On the left there is moderate distal petrous segment stenosis due to calcified plaque. 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 There is moderate to severe anterior genu segment stenosis due to calcified plaque, and additional severe left supraclinoid segment stenosis due to calcified plaque. These appear progressed since 2014. The left ICA terminus remains patent. MCA and ACA origins are patent. ACA origins and A1 segments appear normal. Bilateral ACA branches are within normal limits. There is calcified plaque at the left MCA origin but no stenosis. The left M1 segment, left trifurcation, and left MCA branches are within normal limits. There is calcified plaque in the right MCA origin and M1 segment with only mild stenosis. The right MCA bifurcation and right MCA branches are patent with mild irregularity. No discrete right MCA branch occlusion is identified although there is decreased distal right MCA branch enhancement compared to the left side (series 12, image 13). Venous sinuses: Grossly patent, suboptimal timing for venous valuation. Anatomic variants: Dominant left vertebral artery. Mild bovine type arch configuration. Review of the MIP images confirms the above findings IMPRESSION: 1. Unreliable CBF results due to cytotoxic edema in the Right PCA territory. 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 This was discussed by telephone with Dr. Ritta Slot on 11/04/2017 at 1229 hours. 2. With regard to #1, there is poor flow in the right P3 divisions, but the right P1 and P2 segments remain patent. 3. Positive for severe atherosclerosis and multifocal other high-grade intra- and extracranial stenosis: - high-grade Left ICA origin radiographic string sign stenosis  due to bulky calcified plaque. - moderate to severe bilateral ICA siphon stenosis appears progressed since 2014 and is due to calcified plaque, with multifocal high-grade stenoses on the Left . - moderate to severe stenosis of the non dominant Right Vertebral Artery at both its origin and V4 segments. 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 4.  Aortic Atherosclerosis (ICD10-I70.0). Electronically Signed   By: Odessa Fleming M.D.   On: 11/04/2017 00:52   Ct Head Code Stroke Wo Contrast  Result Date: 11/04/2017 CLINICAL DATA:  Code stroke. 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 Last known normal 1900 hours. EXAM: CT HEAD WITHOUT CONTRAST TECHNIQUE: Contiguous axial images were obtained from the base of the skull through the vertex without intravenous contrast. COMPARISON:  Brain MRI, intracranial MRA, and noncontrast head CT 03/19/2013 FINDINGS: Brain: Similar cerebral volume since 2014. 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 No cortical encephalomalacia identified, but there is abnormal hypodensity in the medial right occipital lobe on series 3, image 15 and coronal image 47. No associated hemorrhage or mass effect. No other cytotoxic edema identified. No midline shift, ventriculomegaly, mass effect, evidence of mass lesion, or acute intracranial hemorrhage. Vascular: Extensive calcified atherosclerosis at the skull base. Questionable abnormal hyperdensity of the right PCA on series 5, image 42. Skull: No acute osseous abnormality identified. Sinuses/Orbits: Widespread paranasal sinus mucosal thickening and fluid levels are  largely new since 2014. The bilateral tympanic cavities and mastoids remain clear. Other: No acute orbit or scalp soft tissue findings. Orbits soft tissues appears stable since 2014. ASPECTS [facility] Stroke Program Early CT Score) - Ganglionic level infarction (caudate, lentiform nuclei, internal  capsule, insula, M1-M3 cortex): 7 - Supraganglionic infarction (M4-M6 cortex): 3 Total score (0-10 with 10 being normal): 10 IMPRESSION: 1. 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 2. Suspicion of hyperdense right PCA indicating occlusion. 3. Underlying advanced chronic small vessel disease. 4. These results were communicated to Dr. Amada Jupiter at 12:22 amon 3/25/2019by text page via the [facility] messaging system. Electronically Signed   By: Odessa Fleming M.D.   On: 11/04/2017 00:23   MRI pending  LE venous doppler pending  TEE pending  TTE 10/14/17 - Left ventricle: The cavity size was normal. Wall thickness was   increased in a pattern of severe LVH. Systolic function was   normal. The estimated ejection fraction was in the range of 55%   to 60%. - Aortic valve: There was mild stenosis. There was mild   regurgitation. Valve area (VTI): 2.23 cm^2. Valve area (Vmax):   1.55 cm^2. Valve area (Vmean): 1.53 cm^2. - Left atrium: The atrium was moderately dilated. - Atrial septum: No defect or patent foramen ovale was identified.   PHYSICAL EXAM  Temp:  [98.1 F (36.7 C)-98.8 F (37.1 C)] 98.3 F (36.8 C) (03/25 0824) Pulse Rate:  [65-71] 67 (03/25 0824) Resp:  [11-18] 18 (03/25 0824) BP: (136-165)/(68-91) 153/72 (03/25 0824) SpO2:  [93 %-99 %] 97 % (03/25 0824) Weight:  [166 lb 7.2 oz (75.5 kg)-171 lb 11.8 oz (77.9 kg)] 166 lb 7.2 oz (75.5 kg) (03/25 0430)  General - Well nourished, well developed, in no apparent distress.  Ophthalmologic - fundi not visualized due to noncooperation.  Cardiovascular - Regular rate and rhythm.  Mental Status -  Level of arousal and orientation to time, place, and person were intact. Language including expression, naming, repetition, comprehension was assessed and found intact. Fund of Knowledge was assessed and was impaired with previous presidents  Cranial Nerves II - XII - II -  left dense hemianopia. III, IV, VI - Extraocular movements intact. V - Facial sensation intact bilaterally. VII - Facial movement intact bilaterally. Left eye closure not as strong as right VIII - Hearing & vestibular intact bilaterally. X - Palate elevates symmetrically. XI - Chin turning & shoulder shrug intact bilaterally. XII - Tongue protrusion intact.  Motor Strength - The patient's strength was normal in all extremities and pronator drift was absent.  Bulk was normal and fasciculations were absent.   Motor Tone - Muscle tone was assessed at the neck and appendages and was normal.  Reflexes - The patient's reflexes were symmetrical in all extremities and he had no pathological reflexes.  Sensory - Light touch, temperature/pinprick were assessed and were symmetrical.    Coordination - The patient had normal movements in the hands with no ataxia or dysmetria.  Tremor was absent.  Gait and Station - deferred.   ASSESSMENT/PLAN Mr. Thomas [name]. is a 77 y.o. male with history of CHF, CKD, CAD s/p CABG 1999, HLD, HTN, TIA in 2003 and stroke in 2014 admitted for confusion and left visual field cut. No tPA given due to clear hypodensity on CT.    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   2D Echo  EF 55-60% on 10/14/17  TEE and loop pending to rule out cardiac source of stroke  LDL 80  HgbA1c 5.8  lovenox for VTE prophylaxis  Fall precautions  Diet Heart Room service appropriate? Yes; Fluid consistency: Thin   clopidogrel 75 mg daily prior to admission, now on aspirin 325 mg daily and clopidogrel 75 mg daily. Continue DAPT for 3 weeks and then plavix alone.   Patient counseled to be compliant with his antithrombotic  medications  Ongoing aggressive stroke risk factor management  Therapy recommendations:  pending  Disposition:  pending  Hx of stroke/TIA  TIA 2003  Stroke 03/2013 with left lower extremity weakness, MRI showed right CR, left frontal cortex, left occipital white matter infarcts, LDL 55, A1c 5.9, carotid Doppler negative, MRI showed right VA and bilateral ICA siphon stenosis.  Aspirin 325 changed to aspirin 325 and Plavix 75 dual antiplatelet.  Followed with Dr. Pearlean Brownie in clinic and on plavix PTA  Carotid stenosis and intracranial stenosis  Left ICA bifurcation high-grade stenosis with string sign on CTA  Need close vascular surgery outpatient follow-up for asymptomatic left ICA high-grade stenosis   Bilateral ICA siphon progressive atherosclerosis and stenosis  Put on duel antiplatelet and high-dose Lipitor  Hypertension Stable Permissive hypertension (OK if <220/120) for 24-48 hours post stroke and then gradually normalized within 5-7 days.  Long term BP goal 130-150 due to left ICA stenosis  Hyperlipidemia  Home meds: Lipitor 40  LDL 80, goal < 70  Now on Lipitor 80  Continue statin at discharge  Other Stroke Risk Factors  Advanced age  Coronary artery disease s/p CABG in 1999  CHF - following with Dr. Eden Emms  Other Active Problems  Left hemianopia, patient currently not driving as per wife  CKD stage III. Cre 1.80  Hospital day # 0   Marvel Plan, MD PhD Stroke Neurology 11/04/2017 11:00 AM    To contact Stroke Continuity provider, please refer to [email or web address]. After hours, contact General Neurology

## 2017-11-04 NOTE — Evaluation (Signed)
Physical Therapy Evaluation Patient Details Name: Thomas [name]. MRN: [id number] DOB: April 23, 1941 Today's Date: 11/04/2017   History of Present Illness  Thomas Parkeris a 77 y.o.malewith medical history significant ofstroke with mild left leg weakness, hypertension, hyperlipidemia, CAD, CABG, dCHF, CKD-II, who presents with altered mental status and difficulty seeing out of left eye. MRI revealed infarct of R PCA territory.  Clinical Impression  Pt with residual L sided weakness from previous stroke however now with L sided neglect and L heminopsia. Pt unable to navigate safely in hallways with RW requiring assist. Pt to benefit from short stay at [facility] for aggressive rehab to learn how to compensate for L vision field cut to achieve safe mod I level of function as he was PTA.    Follow Up Recommendations CIR;Supervision/Assistance - 24 hour    Equipment Recommendations  None recommended by PT    Recommendations for Other Services Rehab consult     Precautions / Restrictions Precautions Precautions: Fall Precaution Comments: L heminopsia Restrictions Weight Bearing Restrictions: No      Mobility  Bed Mobility Overal bed mobility: Modified Independent             General bed mobility comments: directional v/c's, hob elevated but no physical assist required  Transfers Overall transfer level: Needs assistance Equipment used: Rolling walker (2 wheeled) Transfers: Sit to/from Stand Sit to Stand: Min assist         General transfer comment: minA to power up and steady pt during transition of hands from bed to walker. max directional v/c's to complete task  Ambulation/Gait Ambulation/Gait assistance: Min assist Ambulation Distance (Feet): 120 Feet Assistive device: Rolling walker (2 wheeled) Gait Pattern/deviations: Step-to pattern;Decreased stride length;Decreased stance time - left;Decreased step length - left;Decreased weight shift to left;Decreased  dorsiflexion - left;Narrow base of support Gait velocity: slow Gait velocity interpretation: Below normal speed for age/gender General Gait Details: pt with L knee hyperextension during stance phase. pt required minA for walker management and navigating to the Left.    Stairs            Wheelchair Mobility    Modified Rankin (Stroke Patients Only) Modified Rankin (Stroke Patients Only) Pre-Morbid Rankin Score: Moderate disability Modified Rankin: Moderately severe disability     Balance Overall balance assessment: Needs assistance Sitting-balance support: No upper extremity supported;Feet supported Sitting balance-Leahy Scale: Fair     Standing balance support: Bilateral upper extremity supported Standing balance-Leahy Scale: Poor Standing balance comment: dependent on RW                             Pertinent Vitals/Pain Pain Assessment: No/denies pain    Home Living Family/patient expects to be discharged to:: Private residence Living Arrangements: Spouse/significant other Available Help at Discharge: Available 24 hours/day;Family Type of Home: House Home Access: Stairs to enter Entrance Stairs-Rails: None Entrance Stairs-Number of Steps: 3-4 Home Layout: Two level;Bed/bath upstairs Home Equipment: Walker - 4 wheels;Shower seat      Prior Function Level of Independence: Independent with assistive device(s)         Comments: pt uses rollator for ambulation, assists to get into tub however pt able to complete dressing and bathing on own     Hand Dominance   Dominant Hand: Right    Extremity/Trunk Assessment   Upper Extremity Assessment Upper Extremity Assessment: LUE deficits/detail LUE Deficits / Details: residual weakness from previous stroke    Lower Extremity  Assessment Lower Extremity Assessment: LLE deficits/detail LLE Deficits / Details: residual weakness from previous stroke    Cervical / Trunk Assessment Cervical / Trunk  Assessment: Normal  Communication   Communication: No difficulties  Cognition Arousal/Alertness: Awake/alert Behavior During Therapy: WFL for tasks assessed/performed Overall Cognitive Status: Impaired/Different from baseline Area of Impairment: Safety/judgement;Awareness;Problem solving                         Safety/Judgement: Decreased awareness of deficits;Decreased awareness of safety(L sided neglect) Awareness: Emergent Problem Solving: Slow processing;Difficulty sequencing;Requires verbal cues;Requires tactile cues General Comments: pt with L sided neglect and requires minA to manage RW      General Comments General comments (skin integrity, edema, etc.): pt with noted L heminopsia    Exercises     Assessment/Plan    PT Assessment Patient needs continued PT services  PT Problem List Decreased strength;Decreased range of motion;Decreased activity tolerance;Decreased balance;Decreased mobility;Decreased coordination;Decreased cognition;Decreased knowledge of use of DME;Decreased safety awareness;Decreased knowledge of precautions       PT Treatment Interventions DME instruction;Gait training;Stair training;Functional mobility training;Therapeutic activities;Therapeutic exercise;Balance training;Neuromuscular re-education;Cognitive remediation;Patient/family education    PT Goals (Current goals can be found in the Care Plan section)  Acute Rehab PT Goals Patient Stated Goal: home PT Goal Formulation: With patient/family Time For Goal Achievement: 11/11/17 Potential to Achieve Goals: Good Additional Goals Additional Goal #1: Pt to demo compensatory techniques to manage L vision field cut.    Frequency Min 4X/week   Barriers to discharge        Co-evaluation               AM-PAC PT "6 Clicks" Daily Activity  Outcome Measure Difficulty turning over in bed (including adjusting bedclothes, sheets and blankets)?: A Lot Difficulty moving from lying on  back to sitting on the side of the bed? : A Lot Difficulty sitting down on and standing up from a chair with arms (e.g., wheelchair, bedside commode, etc,.)?: Unable Help needed moving to and from a bed to chair (including a wheelchair)?: A Little Help needed walking in hospital room?: A Little Help needed climbing 3-5 steps with a railing? : A Lot 6 Click Score: 13    End of Session Equipment Utilized During Treatment: Gait belt Activity Tolerance: Patient tolerated treatment well Patient left: in chair;with call bell/phone within reach;with chair alarm set;with family/visitor present Nurse Communication: Mobility status PT Visit Diagnosis: Unsteadiness on feet (R26.81);Hemiplegia and hemiparesis Hemiplegia - Right/Left: Left Hemiplegia - dominant/non-dominant: Non-dominant    Time: 9147-8295 PT Time Calculation (min) (ACUTE ONLY): 26 min   Charges:   PT Evaluation $PT Eval Moderate Complexity: 1 Mod PT Treatments $Gait Training: 8-22 mins   PT G Codes:        Lewis Shock, PT, DPT Pager #: [phone] Office #: [phone]   Patton Rabinovich M Adalind Weitz 11/04/2017, 11:09 AM

## 2017-11-04 NOTE — Progress Notes (Signed)
Rehab Admissions Coordinator Note:  Patient was screened by Clois Dupes for appropriateness for an Inpatient Acute Rehab Consult per PT and OT recommendations.   At this time, we are recommending Inpatient Rehab consult.  Clois Dupes 11/04/2017, 11:49 AM  I can be reached at [phone].

## 2017-11-04 NOTE — Progress Notes (Signed)
Patient admitted after midnight, please see H&P.  Plan per neuro: TEE/loop, ASA/plavix Vascular surgery follow up outpatient for left ICA stenosis -CIR consult pending  Marlin Canary DO

## 2017-11-04 NOTE — Progress Notes (Signed)
      [facility] has been requested to perform a transesophageal echocardiogram on Thomas [name]. for stroke.  After careful review of history and examination, the risks and benefits of transesophageal echocardiogram have been explained including risks of esophageal damage, perforation (1:10,000 risk), bleeding, pharyngeal hematoma as well as other potential complications associated with conscious sedation including aspiration, arrhythmia, respiratory failure and death. Alternatives to treatment were discussed, questions were answered. Patient is willing to proceed.    Georgie Chard NP-C [facility] Pager: [phone]  11/04/2017 5:45 PM

## 2017-11-04 NOTE — Consult Note (Signed)
Neurology Consultation Reason for Consult: Altered mental status Referring Physician: Rhunette Croft, A  CC: Altered mental status  History is obtained from: Patient, wife  HPI: Thomas [name]. is a 77 y.o. male with a history of previous stroke who presents with confusion and visual change started earlier tonight.  The patient is unaware of his symptoms.  His wife noted him to be trying to use his walker and taking it against the floor saying it was not working right.  He could not remember how to work the TV remote.  He was last known to be well at some time during the [employer] game, at the end of the game his wife talked to him and he seemed okay, but he did not stand up or trying to do anything complicated during that timeframe.  She got up and was watching TV in a different room, and it was at that point that she came back into the room and found him to be confused and try to use his walker  LKW: Unclear, with patient unaware of his symptoms in a patient who would be sitting and talking without clear deficit, I think this could have happened considerably earlier. tpa given?: no, unclear time of onset   ROS: A 14 point ROS was performed and is negative except as noted in the HPI.   Past Medical History:  Diagnosis Date  . CHF (congestive heart failure) (HCC) 1999  . CKD (chronic kidney disease), stage II   . Coronary artery disease   . Hx of colonic polyps   . Hyperlipidemia   . Hyperplastic colon polyp 04/15/2007  . Hypertension   . Prostatic hypertrophy, benign    with elevated PSA  . TIA (transient ischemic attack) 2003   "mini stroke" (03/19/2013)     Family History  Problem Relation Age of Onset  . Coronary artery disease Mother   . Stroke Father      Social History:  reports that he quit smoking about 4 years ago. His smoking use included cigarettes. He has a 37.50 pack-year smoking history. He has never used smokeless tobacco. He reports that he drinks about 0.6 oz of  alcohol per week. He reports that he does not use drugs.   Exam: Current vital signs: BP 136/82   Pulse 69   Temp 98.8 F (37.1 C) (Oral)   Resp 17   Wt 77.9 kg (171 lb 11.8 oz)   SpO2 97%   BMI 23.95 kg/m  Vital signs in last 24 hours: Temp:  [98.8 F (37.1 C)] 98.8 F (37.1 C) (03/24 2328) Pulse Rate:  [65-71] 69 (03/25 0100) Resp:  [12-17] 17 (03/25 0100) BP: (136-164)/(68-83) 136/82 (03/25 0100) SpO2:  [95 %-98 %] 97 % (03/25 0100) Weight:  [77.9 kg (171 lb 11.8 oz)] 77.9 kg (171 lb 11.8 oz) (03/25 0023)   Physical Exam  Constitutional: Appears well-developed and well-nourished.  Psych: Affect appropriate to situation Eyes: No scleral injection HENT: No OP obstrucion Head: Normocephalic.  Cardiovascular: Normal rate and regular rhythm.  Respiratory: Effort normal, non-labored breathing GI: Soft.  No distension. There is no tenderness.  Skin: WDI  Neuro: Mental Status: Patient is awake, alert, oriented to person, place, month, year, and situation. Patient is able to give a clear and coherent history. No signs of aphasia or neglect Cranial Nerves: II: Left hemianopia. pupils are equal, round, and reactive to light.   III,IV, VI: He has a right gaze preference, but does cross midline to the  left V: Facial sensation is symmetric to temperature VII: Facial movement is symmetric.  VIII: hearing is intact to voice X: Uvula elevates symmetrically XI: Shoulder shrug is symmetric. XII: tongue is midline without atrophy or fasciculations.  Motor: Tone is normal. Bulk is normal.  Mild left hemiparesis 4/5 in the arm and leg Sensory: Sensation is symmetric to light touch and temperature in the arms and legs. Cerebellar: No clear ataxia  I have reviewed labs in epic and the results pertinent to this consultation are: Creatinine 1.8  I have reviewed the images obtained: CT head-infarct in the right PCA distribution CT perfusion-what I suspect is pseudonormalization  of blood flow suggesting ischemic penumbra, however with the  CT change I suspect that this is completed infarct.  Impression: 77 year old male with multifocal atherosclerotic disease and new PCA infarct.  Given this, I suspect artery to artery embolus versus thrombotic disease to be most likely.  Given the size of the infarct, I suspect that he had occlusion followed by infarct followed by recanalization which would explain his CT perfusion.  Recommendations: 1. HgbA1c, fasting lipid panel 2. MRI of the brain without contrast 3. Frequent neuro checks 4. Echocardiogram 5. Prophylactic therapy-Antiplatelet med: Aspirin - dose 325mg  PO or 300mg  PR 6. Risk factor modification 7. Telemetry monitoring 8. PT consult, OT consult, Speech consult 9. please page stroke NP  Or  PA  Or MD  from 8am -4 pm as this patient will be followed by the stroke team at this point.   You can look them up on [email or web address]      Ritta Slot, MD [facility] [phone]  If 7pm- 7am, please page neurology on call as listed in AMION.

## 2017-11-04 NOTE — Consult Note (Signed)
Physical Medicine and Rehabilitation Consult   Reason for Consult: Functional deficits due to stroke Referring Physician: Dr. Benjamine Mola   HPI: Thomas [name]. is a 77 y.o. 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 History taken from chart review, patient, and wife.  UDS negative. CT head reviewed, suggesting right PCA infarct.  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 Stroke work up initiated with DAPT recommended for embolic stroke due to unclear source. 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 CIR recommended due to functional decline.    Review of Systems  Constitutional: Positive for diaphoresis. Negative for chills and fever.  HENT: Negative for hearing loss and tinnitus.   Eyes: Negative for blurred vision and double vision.  Respiratory: Negative for cough and shortness of breath.   Cardiovascular: Negative for chest pain.  Gastrointestinal: Negative for diarrhea, heartburn and nausea.  Genitourinary: Negative for dysuria and urgency.  Musculoskeletal: Positive for falls. Negative for myalgias.  Skin: Negative for rash.  Neurological: Positive for focal weakness.  Psychiatric/Behavioral: The patient does not have insomnia.   All other systems reviewed and are negative.    Past Medical History:  Diagnosis Date  . CHF (congestive heart failure) (HCC) 1999  . CKD (chronic kidney disease), stage II   . Coronary artery disease   . Hx of colonic polyps   . Hyperlipidemia   . Hyperplastic colon polyp 04/15/2007  . Hypertension   . Prostatic hypertrophy, benign    with elevated PSA  . TIA (transient ischemic attack) 2003   "mini stroke" (03/19/2013)    Past Surgical History:  Procedure Laterality Date  . CARDIAC CATHETERIZATION    . CATARACT EXTRACTION W/ INTRAOCULAR LENS  IMPLANT, BILATERAL Bilateral 1998  . COLONOSCOPY  2008  . CORONARY ARTERY BYPASS GRAFT  1999   x4    Family History  Problem Relation Age of Onset  . Coronary artery disease Mother   . Stroke Father     Social History:   Married. Used walker PTA-- has chair lift to get to bathroom upstairs. He reports that he quit smoking about 4 years ago. His smoking use included cigarettes. He has a 37.50 pack-year smoking history. He has never used smokeless tobacco. He reports that he drinks about 0.6 oz of alcohol per week. He reports that he does not use drugs.    Allergies  Allergen Reactions  . Sulfonamide Derivatives     REACTION: Weak,lethargic    Medications Prior to Admission  Medication Sig Dispense Refill  . atorvastatin (LIPITOR) 40 MG tablet TAKE 1 TABLET BY MOUTH  DAILY 90 tablet 2  . clopidogrel (PLAVIX) 75 MG tablet TAKE 1 TABLET BY MOUTH  DAILY WITH BREAKFAST 90 tablet 3  . co-enzyme Q-10 50 MG capsule Take 50 mg by mouth daily.      . Flax OIL Take 1 capsule by mouth daily.      . folic acid (FOLVITE) 1 MG tablet TAKE 1 TABLET EVERY DAY 90 tablet 3  . hydrochlorothiazide (MICROZIDE) 12.5 MG capsule Take 1 capsule (12.5 mg total) by mouth daily. 90 capsule 3  . loratadine (CLARITIN) 10 MG tablet Take 10 mg by mouth  daily.    . losartan (COZAAR) 25 MG tablet Take 1 tablet (25 mg total) by mouth daily. 90 tablet 3  . metoprolol tartrate (LOPRESSOR) 50 MG tablet TAKE 1 TABLET BY MOUTH TWO  TIMES DAILY 180 tablet 3  . Omega-3 Fatty Acids (FISH OIL) 1000 MG CAPS Take 1 capsule by mouth daily.        Home: Home Living Family/patient expects to be discharged to:: Private residence Living Arrangements: Spouse/significant other Available Help at Discharge: Available 24 hours/day, Family Type of Home: House Home Access: Stairs  to enter Secretary/administrator of Steps: 3-4 Entrance Stairs-Rails: None Home Layout: Two level, Bed/bath upstairs Alternate Level Stairs-Number of Steps: 14(but has chair lift) Alternate Level Stairs-Rails: Right Bathroom Shower/Tub: Engineer, manufacturing systems: Standard Home Equipment: Environmental consultant - 4 wheels, Shower seat  Functional History: Prior Function Level of Independence: Independent with assistive device(s) Comments: pt uses rollator for ambulation, assists to get into tub however pt able to complete dressing and bathing on own Functional Status:  Mobility: Bed Mobility Overal bed mobility: Modified Independent General bed mobility comments: OOB upon arrival Transfers Overall transfer level: Needs assistance Equipment used: Rolling walker (2 wheeled) Transfers: Sit to/from Stand Sit to Stand: Mod assist General transfer comment: minA to power up and steady pt during transition of hands from bed to walker. max directional v/c's to complete task. Mod A for safe descent Ambulation/Gait Ambulation/Gait assistance: Min assist Ambulation Distance (Feet): 120 Feet Assistive device: Rolling walker (2 wheeled) Gait Pattern/deviations: Step-to pattern, Decreased stride length, Decreased stance time - left, Decreased step length - left, Decreased weight shift to left, Decreased dorsiflexion - left, Narrow base of support General Gait Details: pt with L knee hyperextension during stance phase. pt required minA for walker management and navigating to the Left.   Gait velocity: slow Gait velocity interpretation: Below normal speed for age/gender    ADL: ADL Overall ADL's : Needs assistance/impaired Eating/Feeding: Set up, Sitting Eating/Feeding Details (indicate cue type and reason): Pt finishing breakfast upon arrival Grooming: Oral care, Wash/dry face, Standing, Moderate assistance, Cueing for sequencing Grooming Details (indicate cue type and reason): Pt requiring Mod A for  posterior lean while up at sink. Pt unable to maintain static standing without UE support or physical A. Pt requiring Max cues to attend to left visual field. Pt present with left visual field cut and inattention. Pt unable to locate tooth brush or tooth paste on left side. Requiring Max cues to locate hot water faucet; pt repetatively turning on cold water and requiring Max cues to stop cold water and locate hot water. Upper Body Bathing: Minimal assistance, Sitting Lower Body Bathing: Maximal assistance, Sit to/from stand Upper Body Dressing : Minimal assistance, Sitting Lower Body Dressing: Maximal assistance, Sit to/from stand Toilet Transfer: Moderate assistance, Ambulation, RW(Simulated to recliner) Toilet Transfer Details (indicate cue type and reason): Mod A for safe descent to recliner. Pt requiring multiple attempts to power up into standing Functional mobility during ADLs: Moderate assistance, Rolling walker General ADL Comments: Pt demonstrating decreased functional performance with deficits in balance, vision, and strength. Pt with poor awarness of deficits. Pt not noticing OT entered room due to left visual deficits.  Cognition: Cognition Overall Cognitive Status: Impaired/Different from baseline Orientation Level: Oriented to person, Oriented to place, Oriented to time Cognition Arousal/Alertness: Awake/alert Behavior During Therapy: WFL for tasks assessed/performed Overall Cognitive Status: Impaired/Different from baseline Area of Impairment: Safety/judgement, Awareness, Problem solving Safety/Judgement: Decreased awareness of deficits, Decreased awareness of safety(L  sided neglect) Awareness: Emergent Problem Solving: Slow processing, Difficulty sequencing, Requires verbal cues, Requires tactile cues General Comments: pt with L sided neglect and required Max verbal cues to attend to objects and environment on left side. Requiring increased time throughout session for slow  processing  Blood pressure 125/68, pulse 72, temperature 98 F (36.7 C), temperature source Oral, resp. rate 18, height 5\' 11"  (1.803 m), weight 75.5 kg (166 lb 7.2 oz), SpO2 100 %. Physical Exam  Nursing note and vitals reviewed. Constitutional: He is oriented to person, place, and time. He appears well-developed and well-nourished. No distress.  HENT:  Head: Normocephalic and atraumatic.  Eyes: Pupils are equal, round, and reactive to light. Conjunctivae and EOM are normal. Right eye exhibits no discharge. Left eye exhibits no discharge.  Neck: Normal range of motion. Neck supple.  Cardiovascular: Normal rate and regular rhythm.  Respiratory: Effort normal and breath sounds normal. No respiratory distress. He has no wheezes.  GI: Soft. Bowel sounds are normal. There is no tenderness.  Musculoskeletal: He exhibits no edema or tenderness.  Neurological: He is alert and oriented to person, place, and time.  Speech clear.  Able to follow basic commands without difficulty.  Motor: RUE: 5/5 proximal to distal LUE: 4+/5 proximal to distal RLE: 4+-5/5 proximal to distal LLE: 4-4+/5 proximal to distal ?B/l UE mild ataxia  Skin: Skin is warm and dry. He is not diaphoretic.  Psychiatric: He has a normal mood and affect. His behavior is normal.    Results for orders placed or performed during the hospital encounter of 11/03/17 (from the past 24 hour(s))  Ethanol     Status: None   Collection Time: 11/03/17 11:43 PM  Result Value Ref Range   Alcohol, Ethyl (B) <10 <10 mg/dL  Protime-INR     Status: None   Collection Time: 11/03/17 11:43 PM  Result Value Ref Range   Prothrombin Time 14.4 11.4 - 15.2 seconds   INR 1.13   APTT     Status: None   Collection Time: 11/03/17 11:43 PM  Result Value Ref Range   aPTT 29 24 - 36 seconds  CBC     Status: None   Collection Time: 11/03/17 11:43 PM  Result Value Ref Range   WBC 10.2 4.0 - 10.5 K/uL   RBC 4.38 4.22 - 5.81 MIL/uL   Hemoglobin 13.6  13.0 - 17.0 g/dL   HCT 86.5 78.4 - 69.6 %   MCV 92.5 78.0 - 100.0 fL   MCH 31.1 26.0 - 34.0 pg   MCHC 33.6 30.0 - 36.0 g/dL   RDW 29.5 28.4 - 13.2 %   Platelets 200 150 - 400 K/uL  Differential     Status: None   Collection Time: 11/03/17 11:43 PM  Result Value Ref Range   Neutrophils Relative % 75 %   Neutro Abs 7.7 1.7 - 7.7 K/uL   Lymphocytes Relative 16 %   Lymphs Abs 1.6 0.7 - 4.0 K/uL   Monocytes Relative 7 %   Monocytes Absolute 0.7 0.1 - 1.0 K/uL   Eosinophils Relative 2 %   Eosinophils Absolute 0.2 0.0 - 0.7 K/uL   Basophils Relative 0 %   Basophils Absolute 0.0 0.0 - 0.1 K/uL  Comprehensive metabolic panel     Status: Abnormal   Collection Time: 11/03/17 11:43 PM  Result Value Ref Range   Sodium 137 135 - 145 mmol/L   Potassium 4.2 3.5 - 5.1 mmol/L   Chloride 106 101 - 111  mmol/L   CO2 20 (L) 22 - 32 mmol/L   Glucose, Bld 119 (H) 65 - 99 mg/dL   BUN 30 (H) 6 - 20 mg/dL   Creatinine, Ser 4.85 (H) 0.61 - 1.24 mg/dL   Calcium 8.7 (L) 8.9 - 10.3 mg/dL   Total Protein 6.5 6.5 - 8.1 g/dL   Albumin 3.6 3.5 - 5.0 g/dL   AST 24 15 - 41 U/L   ALT 13 (L) 17 - 63 U/L   Alkaline Phosphatase 96 38 - 126 U/L   Total Bilirubin 0.7 0.3 - 1.2 mg/dL   GFR calc non Af Amer 35 (L) >60 mL/min   GFR calc Af Amer 40 (L) >60 mL/min   Anion gap 11 5 - 15  I-stat troponin, ED     Status: None   Collection Time: 11/04/17 12:00 AM  Result Value Ref Range   Troponin i, poc 0.00 0.00 - 0.08 ng/mL   Comment 3          I-Stat Chem 8, ED     Status: Abnormal   Collection Time: 11/04/17 12:01 AM  Result Value Ref Range   Sodium 139 135 - 145 mmol/L   Potassium 4.1 3.5 - 5.1 mmol/L   Chloride 106 101 - 111 mmol/L   BUN 32 (H) 6 - 20 mg/dL   Creatinine, Ser 4.62 (H) 0.61 - 1.24 mg/dL   Glucose, Bld 703 (H) 65 - 99 mg/dL   Calcium, Ion 5.00 (L) 1.15 - 1.40 mmol/L   TCO2 23 22 - 32 mmol/L   Hemoglobin 13.6 13.0 - 17.0 g/dL   HCT 93.8 18.2 - 99.3 %  Urine rapid drug screen (hosp  performed)     Status: None   Collection Time: 11/04/17  1:07 AM  Result Value Ref Range   Opiates NONE DETECTED NONE DETECTED   Cocaine NONE DETECTED NONE DETECTED   Benzodiazepines NONE DETECTED NONE DETECTED   Amphetamines NONE DETECTED NONE DETECTED   Tetrahydrocannabinol NONE DETECTED NONE DETECTED   Barbiturates NONE DETECTED NONE DETECTED  Urinalysis, Routine w reflex microscopic     Status: Abnormal   Collection Time: 11/04/17  1:07 AM  Result Value Ref Range   Color, Urine YELLOW YELLOW   APPearance TURBID (A) CLEAR   Specific Gravity, Urine 1.038 (H) 1.005 - 1.030   pH 6.0 5.0 - 8.0   Glucose, UA NEGATIVE NEGATIVE mg/dL   Hgb urine dipstick SMALL (A) NEGATIVE   Bilirubin Urine NEGATIVE NEGATIVE   Ketones, ur NEGATIVE NEGATIVE mg/dL   Protein, ur 30 (A) NEGATIVE mg/dL   Nitrite NEGATIVE NEGATIVE   Leukocytes, UA LARGE (A) NEGATIVE   RBC / HPF TOO NUMEROUS TO COUNT 0 - 5 RBC/hpf   WBC, UA TOO NUMEROUS TO COUNT 0 - 5 WBC/hpf   Bacteria, UA MANY (A) NONE SEEN   Squamous Epithelial / LPF NONE SEEN NONE SEEN   Mucus PRESENT    Amorphous Crystal PRESENT   Creatinine, urine, random     Status: None   Collection Time: 11/04/17  1:17 AM  Result Value Ref Range   Creatinine, Urine 73.08 mg/dL  Brain natriuretic peptide     Status: Abnormal   Collection Time: 11/04/17  5:39 AM  Result Value Ref Range   B Natriuretic Peptide 202.7 (H) 0.0 - 100.0 pg/mL  Hemoglobin A1c     Status: Abnormal   Collection Time: 11/04/17  5:39 AM  Result Value Ref Range   Hgb A1c MFr Bld 5.8 (  H) 4.8 - 5.6 %   Mean Plasma Glucose 119.76 mg/dL  Lipid panel     Status: None   Collection Time: 11/04/17  5:39 AM  Result Value Ref Range   Cholesterol 138 0 - 200 mg/dL   Triglycerides 80 <161 mg/dL   HDL 42 >09 mg/dL   Total CHOL/HDL Ratio 3.3 RATIO   VLDL 16 0 - 40 mg/dL   LDL Cholesterol 80 0 - 99 mg/dL   Ct Angio Head W Or Wo Contrast  Result Date: 11/04/2017 CLINICAL DATA:  77 year old male  with left hemianopia. Unknown time of symptom onset. EXAM: CT ANGIOGRAPHY HEAD AND NECK CT PERFUSION BRAIN TECHNIQUE: Multidetector CT imaging of the head and neck was performed using the standard protocol during bolus administration of intravenous contrast. Multiplanar CT image reconstructions and MIPs were obtained to evaluate the vascular anatomy. Carotid stenosis measurements (when applicable) are obtained utilizing NASCET criteria, using the distal internal carotid diameter as the denominator. Multiphase CT imaging of the brain was performed following IV bolus contrast injection. Subsequent parametric perfusion maps were calculated using RAPID software. CONTRAST:  ISOVUE-370 IOPAMIDOL (ISOVUE-370) INJECTION 76% COMPARISON:  Head CT without contrast 0007 hours today. Brain MRI and MRA 03/19/2013 FINDINGS: CT Brain Perfusion Findings: CBF (<30%) Volume: zero, but erroneous - see next. Perfusion (Tmax>6.0s) volume: 30 mL, which corresponds to the cytotoxic edema seen on the 0007 hours noncontrast head CT today. Therefore, this is infarct core. Mismatch Volume: Probably zero Infarction Location:Right PCA, right occipital lobe CTA NECK Skeleton: Prior sternotomy. No acute osseous abnormality identified. Upper chest: Mild dependent atelectasis. No superior mediastinal lymphadenopathy. Mild gas and fluid distension of the thoracic esophagus. Other neck: Negative.  No neck mass or lymphadenopathy. Aortic arch: Mild bovine type arch configuration. Calcified arch atherosclerosis. Right carotid system: Tortuous brachiocephalic artery and proximal right CCA with a kinked appearance but no atherosclerotic stenosis. Soft and calcified plaque in the distal right CCA proximal to the bifurcation without stenosis. Bulky calcified plaque at the right carotid bifurcation, but less than 50 % stenosis with respect to the distal vessel at the right ICA origin. Coarse calcified plaque continues into the distal bulb, but stenosis  remains less than 50 % with respect to the distal vessel. Patent right ICA to the skull base. Left carotid system: No left CCA origin stenosis. Mildly tortuous proximal left CCA. Occasional plaque proximal to the left carotid bifurcation without stenosis. Bulky calcified plaque at the left carotid bifurcation with short segment radiographic string sign stenosis (series 10, image 112). The left ICA remains patent with no additional stenosis to the skull base despite some calcified plaque. Vertebral arteries: Tortuous proximal right subclavian artery without atherosclerotic stenosis. Right vertebral artery origin calcified plaque with moderate to severe stenosis. The right vertebral artery is non dominant with additional right V2 and V3 segment calcified plaque, but no other stenosis until the skull base. No proximal right subclavian artery stenosis despite plaque. Calcified plaque at the left vertebral artery origin with moderate to severe stenosis. Dominant left vertebral artery with additional calcified plaque in the V2 segments but no other stenosis to the skull base. CTA HEAD Posterior circulation: Calcified plaque in the right vertebral artery V4 segment with moderate to severe stenosis as the vessel crosses the dura and again just proximal to the right PICA origin which remains patent. This appears progressed since 2014. The right vertebral is patent to the vertebrobasilar junction. The dominant distal left vertebral artery demonstrates calcified plaque with only  mild stenosis. Normal left PICA origin. Patent basilar artery with stable irregularity since 2014 mostly in the proximal 3rd. Mild associated stenosis. Patent SCA and PCA origins. Posterior communicating arteries are diminutive or absent. The right P1 and P2 segments are patent with mild irregularity. There is poor flow in the right P3 segments. The left PCA is patent with mild irregularity. Anterior circulation: Both ICA siphons are patent with  extensive calcified atherosclerosis. There is moderate to severe short segment supraclinoid right ICA stenosis which may have progressed since 2014 (series 11 image 78). The right ICA terminus remains patent. On the left there is moderate distal petrous segment stenosis due to calcified plaque. 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 There is moderate to severe anterior genu segment stenosis due to calcified plaque, and additional severe left supraclinoid segment stenosis due to calcified plaque. These appear progressed since 2014. The left ICA terminus remains patent. MCA and ACA origins are patent. ACA origins and A1 segments appear normal. Bilateral ACA branches are within normal limits. There is calcified plaque at the left MCA origin but no stenosis. The left M1 segment, left trifurcation, and left MCA branches are within normal limits. There is calcified plaque in the right MCA origin and M1 segment with only mild stenosis. The right MCA bifurcation and right MCA branches are patent with mild irregularity. No discrete right MCA branch occlusion is identified although there is decreased distal right MCA branch enhancement compared to the left side (series 12, image 13). Venous sinuses: Grossly patent, suboptimal timing for venous valuation. Anatomic variants: Dominant left vertebral artery. Mild bovine type arch configuration. Review of the MIP images confirms the above findings IMPRESSION: 1. Unreliable CBF results due to cytotoxic edema in the Right PCA territory. 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 This was discussed by telephone with Dr. Ritta Slot on 11/04/2017 at 1229 hours. 2. With regard to #1, there is poor flow in the right P3 divisions, but the right P1 and P2 segments remain patent. 3. Positive for severe atherosclerosis and multifocal other  high-grade intra- and extracranial stenosis: - high-grade Left ICA origin radiographic string sign stenosis due to bulky calcified plaque. - moderate to severe bilateral ICA siphon stenosis appears progressed since 2014 and is due to calcified plaque, with multifocal high-grade stenoses on the Left . - moderate to severe stenosis of the non dominant Right Vertebral Artery at both its origin and V4 segments. 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 4.  Aortic Atherosclerosis (ICD10-I70.0). Electronically Signed   By: Odessa Fleming M.D.   On: 11/04/2017 00:52   Ct Angio Neck W Or Wo Contrast  Result Date: 11/04/2017 CLINICAL DATA:  77 year old male with left hemianopia. Unknown time of symptom onset. EXAM: CT ANGIOGRAPHY HEAD AND NECK CT PERFUSION BRAIN TECHNIQUE: Multidetector CT imaging of the head and neck was performed using the standard protocol during bolus administration of intravenous contrast. Multiplanar CT image reconstructions and MIPs were obtained to evaluate the vascular anatomy. Carotid stenosis measurements (when applicable) are obtained utilizing NASCET criteria, using the distal internal carotid diameter as the denominator. Multiphase CT imaging of the brain was performed following IV bolus contrast injection. Subsequent parametric perfusion maps were calculated using RAPID software. CONTRAST:  ISOVUE-370 IOPAMIDOL (ISOVUE-370) INJECTION 76% COMPARISON:  Head CT without contrast 0007 hours today. Brain MRI and MRA 03/19/2013  FINDINGS: CT Brain Perfusion Findings: CBF (<30%) Volume: zero, but erroneous - see next. Perfusion (Tmax>6.0s) volume: 30 mL, which corresponds to the cytotoxic edema seen on the 0007 hours noncontrast head CT today. Therefore, this is infarct core. Mismatch Volume: Probably zero Infarction Location:Right PCA, right occipital lobe CTA NECK Skeleton: Prior sternotomy. No acute osseous abnormality identified. Upper chest: Mild dependent  atelectasis. No superior mediastinal lymphadenopathy. Mild gas and fluid distension of the thoracic esophagus. Other neck: Negative.  No neck mass or lymphadenopathy. Aortic arch: Mild bovine type arch configuration. Calcified arch atherosclerosis. Right carotid system: Tortuous brachiocephalic artery and proximal right CCA with a kinked appearance but no atherosclerotic stenosis. Soft and calcified plaque in the distal right CCA proximal to the bifurcation without stenosis. Bulky calcified plaque at the right carotid bifurcation, but less than 50 % stenosis with respect to the distal vessel at the right ICA origin. Coarse calcified plaque continues into the distal bulb, but stenosis remains less than 50 % with respect to the distal vessel. Patent right ICA to the skull base. Left carotid system: No left CCA origin stenosis. Mildly tortuous proximal left CCA. Occasional plaque proximal to the left carotid bifurcation without stenosis. Bulky calcified plaque at the left carotid bifurcation with short segment radiographic string sign stenosis (series 10, image 112). The left ICA remains patent with no additional stenosis to the skull base despite some calcified plaque. Vertebral arteries: Tortuous proximal right subclavian artery without atherosclerotic stenosis. Right vertebral artery origin calcified plaque with moderate to severe stenosis. The right vertebral artery is non dominant with additional right V2 and V3 segment calcified plaque, but no other stenosis until the skull base. No proximal right subclavian artery stenosis despite plaque. Calcified plaque at the left vertebral artery origin with moderate to severe stenosis. Dominant left vertebral artery with additional calcified plaque in the V2 segments but no other stenosis to the skull base. CTA HEAD Posterior circulation: Calcified plaque in the right vertebral artery V4 segment with moderate to severe stenosis as the vessel crosses the dura and again just  proximal to the right PICA origin which remains patent. This appears progressed since 2014. The right vertebral is patent to the vertebrobasilar junction. The dominant distal left vertebral artery demonstrates calcified plaque with only mild stenosis. Normal left PICA origin. Patent basilar artery with stable irregularity since 2014 mostly in the proximal 3rd. Mild associated stenosis. Patent SCA and PCA origins. Posterior communicating arteries are diminutive or absent. The right P1 and P2 segments are patent with mild irregularity. There is poor flow in the right P3 segments. The left PCA is patent with mild irregularity. Anterior circulation: Both ICA siphons are patent with extensive calcified atherosclerosis. There is moderate to severe short segment supraclinoid right ICA stenosis which may have progressed since 2014 (series 11 image 78). The right ICA terminus remains patent. On the left there is moderate distal petrous segment stenosis due to calcified plaque. 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 There is moderate to severe anterior genu segment stenosis due to calcified plaque, and additional severe left supraclinoid segment stenosis due to calcified plaque. These appear progressed since 2014. The left ICA terminus remains patent. MCA and ACA origins are patent. ACA origins and A1 segments appear normal. Bilateral ACA branches are within normal limits. There is calcified plaque at the left MCA origin but no stenosis. The left M1 segment, left trifurcation, and left MCA branches are within normal limits. There is calcified plaque in  the right MCA origin and M1 segment with only mild stenosis. The right MCA bifurcation and right MCA branches are patent with mild irregularity. No discrete right MCA branch occlusion is identified although there is decreased distal right MCA branch enhancement compared to the left side (series 12, image 13). Venous sinuses: Grossly  patent, suboptimal timing for venous valuation. Anatomic variants: Dominant left vertebral artery. Mild bovine type arch configuration. Review of the MIP images confirms the above findings IMPRESSION: 1. Unreliable CBF results due to cytotoxic edema in the Right PCA territory. 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 This was discussed by telephone with Dr. Ritta Slot on 11/04/2017 at 1229 hours. 2. With regard to #1, there is poor flow in the right P3 divisions, but the right P1 and P2 segments remain patent. 3. Positive for severe atherosclerosis and multifocal other high-grade intra- and extracranial stenosis: - high-grade Left ICA origin radiographic string sign stenosis due to bulky calcified plaque. - moderate to severe bilateral ICA siphon stenosis appears progressed since 2014 and is due to calcified plaque, with multifocal high-grade stenoses on the Left . - moderate to severe stenosis of the non dominant Right Vertebral Artery at both its origin and V4 segments. 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 4.  Aortic Atherosclerosis (ICD10-I70.0). Electronically Signed   By: Odessa Fleming M.D.   On: 11/04/2017 00:52   Ct Cerebral Perfusion W Contrast  Result Date: 11/04/2017 CLINICAL DATA:  77 year old male with left hemianopia. Unknown time of symptom onset. EXAM: CT ANGIOGRAPHY HEAD AND NECK CT PERFUSION BRAIN TECHNIQUE: Multidetector CT imaging of the head and neck was performed using the standard protocol during bolus administration of intravenous contrast. Multiplanar CT image reconstructions and MIPs were obtained to evaluate the vascular anatomy. Carotid stenosis measurements (when applicable) are obtained utilizing NASCET criteria, using the distal internal carotid diameter as the denominator. Multiphase CT imaging of the brain was performed following IV bolus contrast  injection. Subsequent parametric perfusion maps were calculated using RAPID software. CONTRAST:  ISOVUE-370 IOPAMIDOL (ISOVUE-370) INJECTION 76% COMPARISON:  Head CT without contrast 0007 hours today. Brain MRI and MRA 03/19/2013 FINDINGS: CT Brain Perfusion Findings: CBF (<30%) Volume: zero, but erroneous - see next. Perfusion (Tmax>6.0s) volume: 30 mL, which corresponds to the cytotoxic edema seen on the 0007 hours noncontrast head CT today. Therefore, this is infarct core. Mismatch Volume: Probably zero Infarction Location:Right PCA, right occipital lobe CTA NECK Skeleton: Prior sternotomy. No acute osseous abnormality identified. Upper chest: Mild dependent atelectasis. No superior mediastinal lymphadenopathy. Mild gas and fluid distension of the thoracic esophagus. Other neck: Negative.  No neck mass or lymphadenopathy. Aortic arch: Mild bovine type arch configuration. Calcified arch atherosclerosis. Right carotid system: Tortuous brachiocephalic artery and proximal right CCA with a kinked appearance but no atherosclerotic stenosis. Soft and calcified plaque in the distal right CCA proximal to the bifurcation without stenosis. Bulky calcified plaque at the right carotid bifurcation, but less than 50 % stenosis with respect to the distal vessel at the right ICA origin. Coarse calcified plaque continues into the distal bulb, but stenosis remains less than 50 % with respect to the distal vessel. Patent right ICA to the skull base. Left carotid system: No left CCA origin stenosis. Mildly tortuous proximal left CCA. Occasional plaque proximal to the left carotid bifurcation without stenosis. Bulky calcified plaque at the left carotid  bifurcation with short segment radiographic string sign stenosis (series 10, image 112). The left ICA remains patent with no additional stenosis to the skull base despite some calcified plaque. Vertebral arteries: Tortuous proximal right subclavian artery without atherosclerotic  stenosis. Right vertebral artery origin calcified plaque with moderate to severe stenosis. The right vertebral artery is non dominant with additional right V2 and V3 segment calcified plaque, but no other stenosis until the skull base. No proximal right subclavian artery stenosis despite plaque. Calcified plaque at the left vertebral artery origin with moderate to severe stenosis. Dominant left vertebral artery with additional calcified plaque in the V2 segments but no other stenosis to the skull base. CTA HEAD Posterior circulation: Calcified plaque in the right vertebral artery V4 segment with moderate to severe stenosis as the vessel crosses the dura and again just proximal to the right PICA origin which remains patent. This appears progressed since 2014. The right vertebral is patent to the vertebrobasilar junction. The dominant distal left vertebral artery demonstrates calcified plaque with only mild stenosis. Normal left PICA origin. Patent basilar artery with stable irregularity since 2014 mostly in the proximal 3rd. Mild associated stenosis. Patent SCA and PCA origins. Posterior communicating arteries are diminutive or absent. The right P1 and P2 segments are patent with mild irregularity. There is poor flow in the right P3 segments. The left PCA is patent with mild irregularity. Anterior circulation: Both ICA siphons are patent with extensive calcified atherosclerosis. There is moderate to severe short segment supraclinoid right ICA stenosis which may have progressed since 2014 (series 11 image 78). The right ICA terminus remains patent. On the left there is moderate distal petrous segment stenosis due to calcified plaque. 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 There is moderate to severe anterior genu segment stenosis due to calcified plaque, and additional severe left supraclinoid segment stenosis due to calcified plaque. These appear progressed since 2014. The left  ICA terminus remains patent. MCA and ACA origins are patent. ACA origins and A1 segments appear normal. Bilateral ACA branches are within normal limits. There is calcified plaque at the left MCA origin but no stenosis. The left M1 segment, left trifurcation, and left MCA branches are within normal limits. There is calcified plaque in the right MCA origin and M1 segment with only mild stenosis. The right MCA bifurcation and right MCA branches are patent with mild irregularity. No discrete right MCA branch occlusion is identified although there is decreased distal right MCA branch enhancement compared to the left side (series 12, image 13). Venous sinuses: Grossly patent, suboptimal timing for venous valuation. Anatomic variants: Dominant left vertebral artery. Mild bovine type arch configuration. Review of the MIP images confirms the above findings IMPRESSION: 1. Unreliable CBF results due to cytotoxic edema in the Right PCA territory. 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 This was discussed by telephone with Dr. Ritta Slot on 11/04/2017 at 1229 hours. 2. With regard to #1, there is poor flow in the right P3 divisions, but the right P1 and P2 segments remain patent. 3. Positive for severe atherosclerosis and multifocal other high-grade intra- and extracranial stenosis: - high-grade Left ICA origin radiographic string sign stenosis due to bulky calcified plaque. - moderate to severe bilateral ICA siphon stenosis appears progressed since 2014 and is due to calcified plaque, with multifocal high-grade stenoses on the Left . - moderate to severe  stenosis of the non dominant Right Vertebral Artery at both its origin and V4 segments. 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 4.  Aortic Atherosclerosis (ICD10-I70.0). Electronically Signed   By: Odessa Fleming M.D.   On: 11/04/2017 00:52   Ct  Head Code Stroke Wo Contrast  Result Date: 11/04/2017 CLINICAL DATA:  Code stroke. 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 Last known normal 1900 hours. EXAM: CT HEAD WITHOUT CONTRAST TECHNIQUE: Contiguous axial images were obtained from the base of the skull through the vertex without intravenous contrast. COMPARISON:  Brain MRI, intracranial MRA, and noncontrast head CT 03/19/2013 FINDINGS: Brain: Similar cerebral volume since 2014. 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 No cortical encephalomalacia identified, but there is abnormal hypodensity in the medial right occipital lobe on series 3, image 15 and coronal image 47. No associated hemorrhage or mass effect. No other cytotoxic edema identified. No midline shift, ventriculomegaly, mass effect, evidence of mass lesion, or acute intracranial hemorrhage. Vascular: Extensive calcified atherosclerosis at the skull base. Questionable abnormal hyperdensity of the right PCA on series 5, image 42. Skull: No acute osseous abnormality identified. Sinuses/Orbits: Widespread paranasal sinus mucosal thickening and fluid levels are largely new since 2014. The bilateral tympanic cavities and mastoids remain clear. Other: No acute orbit or scalp soft tissue findings. Orbits soft tissues appears stable since 2014. ASPECTS [facility] Stroke Program Early CT Score) - Ganglionic level infarction (caudate, lentiform nuclei, internal capsule, insula, M1-M3 cortex): 7 - Supraganglionic infarction (M4-M6 cortex): 3 Total score (0-10 with 10 being normal): 10 IMPRESSION: 1. 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 2. Suspicion of hyperdense right PCA indicating occlusion. 3. Underlying advanced chronic small vessel disease. 4. These results were communicated to Dr. Amada Jupiter at 12:22 amon 3/25/2019by text page via the [facility] messaging system.  Electronically Signed   By: Odessa Fleming M.D.   On: 11/04/2017 00:23    Assessment/Plan: Diagnosis: Right PCA infarct Labs and images independently reviewed.  Records reviewed and summated above. Stroke: Continue secondary stroke prophylaxis and Risk Factor Modification listed below:   Antiplatelet therapy:   Blood Pressure Management:  Continue current medication with prn's with permisive HTN per primary team Statin Agent:   Prediabetes management:   tor recovery:   1. Does the need for close, 24 hr/day medical supervision in concert with the patient's rehab needs make it unreasonable for this patient to be served in a less intensive setting? Potentially  2. 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 Due to safety, disease management and patient education, does the patient require 24 hr/day rehab nursing? Potentially 4. Does the patient require coordinated care of a physician, rehab nurse, PT (1-2 hrs/day, 5 days/week) and OT (1-2 hrs/day, 5 days/week) to address physical and functional deficits in the context of the above medical diagnosis(es)? Potentially Addressing deficits in the following areas: balance, endurance, locomotion, strength, transferring, bathing, dressing, toileting and psychosocial support 5. Can the patient actively participate in an intensive therapy program of at least 3 hrs of therapy per day at  least 5 days per week? Yes 6. The potential for patient to make measurable gains while on inpatient rehab is good 7. Anticipated functional outcomes upon  discharge from inpatient rehab are supervision  with PT, supervision with OT, n/a with SLP. 8. Estimated rehab length of stay to reach the above functional goals is: 6-9 days. 9. Anticipated D/C setting: Home 10. Anticipated post D/C treatments: HH therapy and Home excercise program 11. Overall Rehab/Functional Prognosis: good  RECOMMENDATIONS: This patient's condition is appropriate for continued rehabilitative care in the following setting: Patient doing well on eval.  Will await reeval by therapies.  If patient continues to progress, recommend home with [facility] with PM&R outpt follow up, however, if deficits persist, recommend CIR. Patient has agreed to participate in recommended program. Yes Note that insurance prior authorization may be required for reimbursement for recommended care.  Comment: Rehab Admissions Coordinator to follow up.  Maryla Morrow, MD, ABPMR Jacquelynn Cree, PA-C 11/04/2017

## 2017-11-04 NOTE — Evaluation (Signed)
Occupational Therapy Evaluation Patient Details Name: Thomas [name]. MRN: [id number] DOB: 13-May-1941 Today's Date: 11/04/2017    History of Present Illness Thomas Parkeris a 77 y.o.malewith medical history significant ofstroke with mild left leg weakness, hypertension, hyperlipidemia, CAD, CABG, dCHF, CKD-II, who presents with altered mental status and difficulty seeing out of left eye. MRI revealed infarct of R PCA territory.   Clinical Impression   PTA, pt was living with his wife and was independent with ADLs and using a rollator for functional mobility. Pt currently requiring Mod A for ADLs in standing, Min A for UB ADLs, and Max A for LB ADLs. Pt presenting with poor balance and requiring physical A to maintain standing at sink due to posterior lean. Pt with significant left visual field deficits and left inattention. Pt requiring Max cues throughout ADLs to locate items in left visual field (I.e. Unable to locate hot water faucet, tooth brush, or tooth paste without Max cues). Educating pt and wife on visual deficits; both agreeing to the need for rehab. Pt with good family support and motivated to participate in therapy. Pt will require further acute OT to facilitate safe dc and address visual deficits during ADLs. Recommend dc to CIR for further intensive OT to optimize safety, independence with ADLs, and return to PLOF.      Follow Up Recommendations  CIR;Supervision/Assistance - 24 hour    Equipment Recommendations  Other (comment)(Defer to next venue)    Recommendations for Other Services Rehab consult     Precautions / Restrictions Precautions Precautions: Fall Precaution Comments: L heminopsia Restrictions Weight Bearing Restrictions: No      Mobility Bed Mobility Overal bed mobility: Modified Independent             General bed mobility comments: OOB upon arrival  Transfers Overall transfer level: Needs assistance Equipment used: Rolling walker  (2 wheeled) Transfers: Sit to/from Stand Sit to Stand: Mod assist         General transfer comment: minA to power up and steady pt during transition of hands from bed to walker. max directional v/c's to complete task. Mod A for safe descent    Balance Overall balance assessment: Needs assistance Sitting-balance support: No upper extremity supported;Feet supported Sitting balance-Leahy Scale: Fair     Standing balance support: Bilateral upper extremity supported Standing balance-Leahy Scale: Poor Standing balance comment: dependent on RW                           ADL either performed or assessed with clinical judgement   ADL Overall ADL's : Needs assistance/impaired Eating/Feeding: Set up;Sitting Eating/Feeding Details (indicate cue type and reason): Pt finishing breakfast upon arrival Grooming: Oral care;Wash/dry face;Standing;Moderate assistance;Cueing for sequencing Grooming Details (indicate cue type and reason): Pt requiring Mod A for posterior lean while up at sink. Pt unable to maintain static standing without UE support or physical A. Pt requiring Max cues to attend to left visual field. Pt present with left visual field cut and inattention. Pt unable to locate tooth brush or tooth paste on left side. Requiring Max cues to locate hot water faucet; pt repetatively turning on cold water and requiring Max cues to stop cold water and locate hot water. Upper Body Bathing: Minimal assistance;Sitting   Lower Body Bathing: Maximal assistance;Sit to/from stand   Upper Body Dressing : Minimal assistance;Sitting   Lower Body Dressing: Maximal assistance;Sit to/from stand   Toilet Transfer: Moderate assistance;Ambulation;RW(Simulated to  recliner) Toilet Transfer Details (indicate cue type and reason): Mod A for safe descent to recliner. Pt requiring multiple attempts to power up into standing         Functional mobility during ADLs: Moderate assistance;Rolling  walker General ADL Comments: Pt demonstrating decreased functional performance with deficits in balance, vision, and strength. Pt with poor awarness of deficits. Pt not noticing OT entered room due to left visual deficits.     Vision Baseline Vision/History: Wears glasses Wears Glasses: At all times Patient Visual Report: No change from baseline Vision Assessment?: Yes;Vision impaired- to be further tested in functional context Eye Alignment: Impaired (comment) Tracking/Visual Pursuits: Decreased smoothness of horizontal tracking;Decreased smoothness of vertical tracking;Requires cues, head turns, or add eye shifts to track;Unable to hold eye position out of midline(Poor visual attention and unable to maintain gaze) Convergence: Impaired - to be further tested in functional context Visual Fields: Left homonymous hemianopsia;Left visual field deficit Diplopia Assessment: (Denies diplopia) Additional Comments: Pt with left visual field deficits as seen during ADL tasks and testing. Pt with left inattention and requiring Max cues to attend to objects on left side. Pt able to gaze to left.      Perception     Praxis      Pertinent Vitals/Pain Pain Assessment: No/denies pain     Hand Dominance Right   Extremity/Trunk Assessment Upper Extremity Assessment Upper Extremity Assessment: LUE deficits/detail LUE Deficits / Details: residual weakness from previous stroke. WFL for ADLs and FM tasks.    Lower Extremity Assessment Lower Extremity Assessment: LLE deficits/detail LLE Deficits / Details: residual weakness from previous stroke   Cervical / Trunk Assessment Cervical / Trunk Assessment: Normal   Communication Communication Communication: No difficulties   Cognition Arousal/Alertness: Awake/alert Behavior During Therapy: WFL for tasks assessed/performed Overall Cognitive Status: Impaired/Different from baseline Area of Impairment: Safety/judgement;Awareness;Problem solving                          Safety/Judgement: Decreased awareness of deficits;Decreased awareness of safety(L sided neglect) Awareness: Emergent Problem Solving: Slow processing;Difficulty sequencing;Requires verbal cues;Requires tactile cues General Comments: pt with L sided neglect and required Max verbal cues to attend to objects and environment on left side. Requiring increased time throughout session for slow processing   General Comments  Wife present during evaluation. Discussed rehab options    Exercises     Shoulder Instructions      Home Living Family/patient expects to be discharged to:: Private residence Living Arrangements: Spouse/significant other Available Help at Discharge: Available 24 hours/day;Family Type of Home: House Home Access: Stairs to enter [employer] of Steps: 3-4 Entrance Stairs-Rails: None Home Layout: Two level;Bed/bath upstairs Alternate Level Stairs-Number of Steps: 14(but has chair lift) Alternate Level Stairs-Rails: Right Bathroom Shower/Tub: Chief Strategy Officer: Standard     Home Equipment: Environmental consultant - 4 wheels;Shower seat          Prior Functioning/Environment Level of Independence: Independent with assistive device(s)        Comments: pt uses rollator for ambulation, assists to get into tub however pt able to complete dressing and bathing on own        OT Problem List: Decreased strength;Decreased range of motion;Decreased activity tolerance;Impaired balance (sitting and/or standing);Impaired vision/perception;Decreased cognition;Decreased safety awareness;Decreased knowledge of use of DME or AE;Decreased knowledge of precautions      OT Treatment/Interventions: Self-care/ADL training;Therapeutic exercise;Energy conservation;DME and/or AE instruction;Therapeutic activities;Patient/family education;Visual/perceptual remediation/compensation;Cognitive remediation/compensation    OT Goals(Current  goals can  be found in the care plan section) Acute Rehab OT Goals Patient Stated Goal: Return to PLOF OT Goal Formulation: With patient/family Time For Goal Achievement: 11/18/17 Potential to Achieve Goals: Good ADL Goals Pt Will Perform Grooming: with set-up;with supervision;standing Pt Will Perform Upper Body Dressing: with set-up;with supervision;sitting Pt Will Perform Lower Body Dressing: sit to/from stand;with min guard assist Pt Will Transfer to Toilet: with min guard assist;ambulating;bedside commode Additional ADL Goal #1: Pt will attend to objects in left visual field during ADLs with 1-2 cues  OT Frequency: Min 3X/week   Barriers to D/C:            Co-evaluation              AM-PAC PT "6 Clicks" Daily Activity     Outcome Measure Help from another person eating meals?: None Help from another person taking care of personal grooming?: A Lot Help from another person toileting, which includes using toliet, bedpan, or urinal?: A Lot Help from another person bathing (including washing, rinsing, drying)?: A Lot Help from another person to put on and taking off regular upper body clothing?: A Lot Help from another person to put on and taking off regular lower body clothing?: A Lot 6 Click Score: 14   End of Session Equipment Utilized During Treatment: Gait belt;Rolling walker Nurse Communication: Mobility status;Precautions;Other (comment)(significant vision deficits)  Activity Tolerance: Patient tolerated treatment well Patient left: in chair;with call bell/phone within reach;with chair alarm set;with nursing/sitter in room  OT Visit Diagnosis: Unsteadiness on feet (R26.81);Other abnormalities of gait and mobility (R26.89);Muscle weakness (generalized) (M62.81);Low vision, both eyes (H54.2);Other symptoms and signs involving cognitive function                Time: 7829-5621 OT Time Calculation (min): 22 min Charges:  OT General Charges $OT Visit: 1 Visit OT Evaluation $OT  Eval Moderate Complexity: 1 Mod G-Codes:     Thomas [name] MSOT, OTR/L Acute Rehab Pager: [phone] Office: [phone]  Thomas [name] 11/04/2017, 11:34 AM

## 2017-11-04 NOTE — Progress Notes (Signed)
SLP Cancellation Note  Patient Details Name: Thomas [name]. MRN: [id number] DOB: [birth year]   Cancelled treatment:       Reason Eval/Treat Not Completed: Patient at procedure or test/unavailable; Pt OOF when SLE attempted: will attempt at a later date.   Tressie Stalker, M.S., CCC-SLP 11/04/2017, 2:59 PM

## 2017-11-04 NOTE — Progress Notes (Signed)
Patient complaining about respiratory difficulty requesting a breathing treatment, order was obtained will notify respiratory.

## 2017-11-05 ENCOUNTER — Encounter (HOSPITAL_COMMUNITY)
Admission: EM | Disposition: A | Discharge status: Rehabilitation Facility | Payer: Self-pay | Source: Home / Self Care | Attending: Internal Medicine

## 2017-11-05 ENCOUNTER — Inpatient Hospital Stay (HOSPITAL_COMMUNITY): Payer: Medicare Other

## 2017-11-05 ENCOUNTER — Encounter (HOSPITAL_COMMUNITY): Payer: Self-pay | Admitting: Internal Medicine

## 2017-11-05 DIAGNOSIS — I6389 Other cerebral infarction: Secondary | ICD-10-CM

## 2017-11-05 DIAGNOSIS — I6522 Occlusion and stenosis of left carotid artery: Secondary | ICD-10-CM

## 2017-11-05 DIAGNOSIS — I639 Cerebral infarction, unspecified: Secondary | ICD-10-CM

## 2017-11-05 HISTORY — PX: TEE WITHOUT CARDIOVERSION: SHX5443

## 2017-11-05 HISTORY — PX: LOOP RECORDER INSERTION: EP1214

## 2017-11-05 LAB — UREA NITROGEN, URINE: Urea Nitrogen, Ur: 514 mg/dL

## 2017-11-05 LAB — URINE CULTURE: Culture: 70000 — AB

## 2017-11-05 LAB — BASIC METABOLIC PANEL
Anion gap: 9 (ref 5–15)
BUN: 13 mg/dL (ref 6–20)
CALCIUM: 8.4 mg/dL — AB (ref 8.9–10.3)
CHLORIDE: 105 mmol/L (ref 101–111)
CO2: 21 mmol/L — AB (ref 22–32)
CREATININE: 0.96 mg/dL (ref 0.61–1.24)
GFR calc non Af Amer: >60 mL/min (ref >60)
Glucose, Bld: 128 mg/dL — ABNORMAL HIGH (ref 65–99)
Potassium: 3.7 mmol/L (ref 3.5–5.1)
Sodium: 135 mmol/L (ref 135–145)

## 2017-11-05 LAB — CBC
HEMATOCRIT: 36.6 % — AB (ref 39.0–52.0)
Hemoglobin: 12.3 g/dL — ABNORMAL LOW (ref 13.0–17.0)
MCH: 30.4 pg (ref 26.0–34.0)
MCHC: 33.6 g/dL (ref 30.0–36.0)
MCV: 90.6 fL (ref 78.0–100.0)
Platelets: 183 10*3/uL (ref 150–400)
RBC: 4.04 MIL/uL — ABNORMAL LOW (ref 4.22–5.81)
RDW: 12.9 % (ref 11.5–15.5)
WBC: 9.2 10*3/uL (ref 4.0–10.5)

## 2017-11-05 SURGERY — LOOP RECORDER INSERTION

## 2017-11-05 SURGERY — ECHOCARDIOGRAM, TRANSESOPHAGEAL
Anesthesia: Moderate Sedation

## 2017-11-05 MED ORDER — HYDROCHLOROTHIAZIDE 12.5 MG PO CAPS
12.5000 mg | ORAL_CAPSULE | Freq: Every day | ORAL | Status: DC
  Administered 2017-11-05 – 2017-11-06 (×2): 12.5 mg via ORAL
  Filled 2017-11-05 (×2): qty 1

## 2017-11-05 MED ORDER — BUTAMBEN-TETRACAINE-BENZOCAINE 2-2-14 % EX AERO
INHALATION_SPRAY | CUTANEOUS | Status: DC | PRN
  Administered 2017-11-05: 2 via TOPICAL

## 2017-11-05 MED ORDER — LABETALOL HCL 5 MG/ML IV SOLN
INTRAVENOUS | Status: AC
  Filled 2017-11-05: qty 4

## 2017-11-05 MED ORDER — FENTANYL CITRATE (PF) 100 MCG/2ML IJ SOLN
INTRAMUSCULAR | Status: DC | PRN
  Administered 2017-11-05 (×2): 25 ug via INTRAVENOUS

## 2017-11-05 MED ORDER — MIDAZOLAM HCL 5 MG/ML IJ SOLN
INTRAMUSCULAR | Status: AC
  Filled 2017-11-05: qty 2

## 2017-11-05 MED ORDER — FENTANYL CITRATE (PF) 100 MCG/2ML IJ SOLN
INTRAMUSCULAR | Status: AC
  Filled 2017-11-05: qty 2

## 2017-11-05 MED ORDER — METOPROLOL TARTRATE 50 MG PO TABS
50.0000 mg | ORAL_TABLET | Freq: Two times a day (BID) | ORAL | Status: DC
  Administered 2017-11-05 – 2017-11-06 (×3): 50 mg via ORAL
  Filled 2017-11-05 (×3): qty 1

## 2017-11-05 MED ORDER — SODIUM CHLORIDE 0.9 % IV SOLN
INTRAVENOUS | Status: DC
  Administered 2017-11-05: 08:00:00 via INTRAVENOUS

## 2017-11-05 MED ORDER — LIDOCAINE-EPINEPHRINE 1 %-1:100000 IJ SOLN
INTRAMUSCULAR | Status: AC
  Filled 2017-11-05: qty 1

## 2017-11-05 MED ORDER — MIDAZOLAM HCL 10 MG/2ML IJ SOLN
INTRAMUSCULAR | Status: DC | PRN
  Administered 2017-11-05 (×2): 2 mg via INTRAVENOUS

## 2017-11-05 MED ORDER — LIDOCAINE-EPINEPHRINE 1 %-1:100000 IJ SOLN
INTRAMUSCULAR | Status: DC | PRN
  Administered 2017-11-05: 25 mL

## 2017-11-05 SURGICAL SUPPLY — 2 items
LOOP REVEAL LINQSYS (Prosthesis & Implant Heart) ×2 IMPLANT
PACK LOOP INSERTION (CUSTOM PROCEDURE TRAY) ×3 IMPLANT

## 2017-11-05 NOTE — Progress Notes (Signed)
PROGRESS NOTE    Thomas [name].  ZOX:[id number] DOB: [birth year] DOA: 11/03/2017 PCP: Shirline Frees, NP   Outpatient Specialists:     Brief Narrative:  Thomas Quast. is a 77 y.o. male with medical history significant of stroke with mild left leg weakness, hypertension, hyperlipidemia, CAD, CABG, dCHF, CKD-II, who presents with altered mental status and difficulty seeing out of left eye.  Per pt's wife, pt was noted to be confused at about 8:30 PM. The patient was confused to his surroundings, not remember how to work the TV remote; could not remember where his bathroom was. Pt had episode of difficulty seeing out of left eye. 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 He has chronic mild left leg weakness from previous stroke, which did not change. Patient does not have new unilateral numbness or tingliness in extremities. Patient denies chest pain, shortness breath, cough, fever or chills. No nausea, vomiting, diarrhea, abdominal pain or symptoms of UTI.      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  CTA head and neck - right P3 flow decreased, left ICA proximal string sign at bifurcation, b/l ICA siphon progressive atherosclerosis with stenosis, right VA V1 and V4 stenosis.  -s/p TEE -2d echo -ldl: 80 -per neuro: now on aspirin 325 mg daily and clopidogrel 75 mg daily. Continue DAPT for  3 months and then plavix alone given hx of stroke and significant extra- and intracranial stenosis.  -CIR when insurance approved  Essential hypertension: -resume home meds including BB -run of vtach on tele   CAD: s/p of CBAG. No CP -ASA, lipitor -increased lipitor dose from 40 to 80 mg daily  HLD (hyperlipidemia): -Lipitor  Chronic diastolic CHF (congestive heart failure) (HCC): 2-D echo on 10/14/17 showed EF of 55-60%.  -resume diuretic -appears to be heading towards overhydration  AoCKD-II:  -improved with hydration but showing signs of volume overload-- resume diuretic  UTI -diphtheroides species -? Change abx- will discuss with pharmacy       Code Status: Full Code   Family Communication: wife  Disposition Plan:  CIR when approved by insurance   Consultants:   Neuro  cards    Subjective: Some SOB last PM  Objective: Vitals:   11/05/17 1005 11/05/17 1010 11/05/17 1015 11/05/17 1020  BP: (!) 164/66  (!) 169/82   Pulse: (!) 101 99 97 (!) 103  Resp: 16 16 17 16   Temp:      TempSrc:      SpO2: 100% 100% 100% 100%  Weight:      Height:        Intake/Output Summary (Last 24 hours) at 11/05/2017 1515 Last data filed at 11/05/2017 0300 Gross per 24 hour  Intake 1660 ml  Output 1100 ml  Net 560 ml   Filed Weights   11/04/17 0023 11/04/17 0430 11/05/17 0741  Weight: 77.9 kg (171 lb 11.8  oz) 75.5 kg (166 lb 7.2 oz) 75.5 kg (166 lb 7.2 oz)    Examination:  General exam: hard of hearing Respiratory system: Clear to auscultation. Respiratory effort normal. Cardiovascular system: S1 & S2 heard, RRR. No JVD, murmurs, rubs, gallops or clicks. No pedal edema. Gastrointestinal system: Abdomen is nondistended, soft and nontender. No organomegaly or masses felt. Normal bowel sounds heard.     Data Reviewed: I have personally reviewed following labs and imaging studies  CBC: Recent Labs  Lab 11/03/17 2343 11/04/17 0001 11/05/17 0410  WBC  10.2  --  9.2  NEUTROABS 7.7  --   --   HGB 13.6 13.6 12.3*  HCT 40.5 40.0 36.6*  MCV 92.5  --  90.6  PLT 200  --  183   Basic Metabolic Panel: Recent Labs  Lab 11/03/17 2343 11/04/17 0001 11/05/17 0410  NA 137 139 135  K 4.2 4.1 3.7  CL 106 106 105  CO2 20*  --  21*  GLUCOSE 119* 118* 128*  BUN 30* 32* 13  CREATININE 1.80* 1.80* 0.96  CALCIUM 8.7*  --  8.4*   GFR: Estimated Creatinine Clearance: 69.7 mL/min (by C-G formula based on SCr of 0.96 mg/dL). Liver Function Tests: Recent Labs  Lab 11/03/17 2343  AST 24  ALT 13*  ALKPHOS 96  BILITOT 0.7  PROT 6.5  ALBUMIN 3.6   No results for input(s): LIPASE, AMYLASE in the last 168 hours. No results for input(s): AMMONIA in the last 168 hours. Coagulation Profile: Recent Labs  Lab 11/03/17 2343  INR 1.13   Cardiac Enzymes: No results for input(s): CKTOTAL, CKMB, CKMBINDEX, TROPONINI in the last 168 hours. BNP (last 3 results) No results for input(s): PROBNP in the last 8760 hours. HbA1C: Recent Labs    11/04/17 0539  HGBA1C 5.8*   CBG: No results for input(s): GLUCAP in the last 168 hours. Lipid Profile: Recent Labs    11/04/17 0539  CHOL 138  HDL 42  LDLCALC 80  TRIG 80  CHOLHDL 3.3   Thyroid Function Tests: No results for input(s): TSH, T4TOTAL, FREET4, T3FREE, THYROIDAB in the last 72 hours. Anemia Panel: No results for input(s): VITAMINB12, FOLATE, FERRITIN, TIBC, IRON, RETICCTPCT in the last 72 hours. Urine analysis:    Component Value Date/Time   COLORURINE YELLOW 11/04/2017 0107   APPEARANCEUR TURBID (A) 11/04/2017 0107   LABSPEC 1.038 (H) 11/04/2017 0107   PHURINE 6.0 11/04/2017 0107   GLUCOSEU NEGATIVE 11/04/2017 0107   HGBUR SMALL (A) 11/04/2017 0107   BILIRUBINUR NEGATIVE 11/04/2017 0107   BILIRUBINUR neg 06/28/2014 0931   KETONESUR NEGATIVE 11/04/2017 0107   PROTEINUR 30 (A) 11/04/2017 0107   UROBILINOGEN 1.0 06/28/2014 0931   UROBILINOGEN 1.0 03/19/2013 1142   NITRITE NEGATIVE  11/04/2017 0107   LEUKOCYTESUR LARGE (A) 11/04/2017 0107      Recent Results (from the past 240 hour(s))  Urine Culture     Status: Abnormal   Collection Time: 11/04/17  1:07 AM  Result Value Ref Range Status   Specimen Description URINE, RANDOM  Final   Special Requests   Final    NONE Performed at [facility], [address] [place], [place] [address]    Culture (A)  Final    70,000 COLONIES/mL DIPHTHEROIDS(CORYNEBACTERIUM SPECIES)   Report Status 11/05/2017 FINAL  Final  Culture, blood (Routine X 2) w Reflex to ID Panel     Status: None (Preliminary result)   Collection Time: 11/04/17  5:49 AM  Result Value Ref Range Status   Specimen Description BLOOD LEFT ANTECUBITAL  Final   Special Requests   Final    BOTTLES DRAWN AEROBIC AND ANAEROBIC Blood Culture adequate volume   Culture   Final    NO GROWTH 1 DAY Performed at [facility], [address] [place], [place] [address]    Report Status PENDING  Incomplete  Culture, blood (Routine X 2) w Reflex to ID Panel     Status: None (Preliminary result)   Collection Time: 11/04/17  5:58 AM  Result Value Ref Range Status   Specimen Description BLOOD RIGHT HAND  Final   Special Requests   Final    BOTTLES DRAWN AEROBIC AND ANAEROBIC Blood Culture adequate volume   Culture   Final    NO GROWTH 1 DAY Performed at [facility], [address] [place], [place] [address]    Report Status PENDING  Incomplete      Anti-infectives (From admission, onward)   Start     Dose/Rate Route Frequency Ordered Stop   11/04/17 0600  cefTRIAXone (ROCEPHIN) 1 g in sodium chloride 0.9 % 100 mL IVPB     1 g 200 mL/hr over 30 Minutes Intravenous Every 24 hours 11/04/17 0454         Radiology Studies: Ct Angio Head W Or Wo Contrast  Result Date: 11/04/2017 CLINICAL DATA:  77 year old male with left hemianopia. Unknown time of symptom onset. EXAM: CT ANGIOGRAPHY HEAD AND NECK CT PERFUSION BRAIN TECHNIQUE: Multidetector  CT imaging of the head and neck was performed using the standard protocol during bolus administration of intravenous contrast. Multiplanar CT image reconstructions and MIPs were obtained to evaluate the vascular anatomy. Carotid stenosis measurements (when applicable) are obtained utilizing NASCET criteria, using the distal internal carotid diameter as the denominator. Multiphase CT imaging of the brain was performed following IV bolus contrast injection. Subsequent parametric perfusion maps were calculated using RAPID software. CONTRAST:  ISOVUE-370 IOPAMIDOL (ISOVUE-370) INJECTION 76% COMPARISON:  Head CT without contrast 0007 hours today. Brain MRI and MRA 03/19/2013 FINDINGS: CT Brain Perfusion Findings: CBF (<30%) Volume: zero, but erroneous - see next. Perfusion (Tmax>6.0s) volume: 30 mL, which corresponds to the cytotoxic edema seen on the 0007 hours noncontrast head CT today. Therefore, this is infarct core. Mismatch Volume: Probably zero Infarction Location:Right PCA, right occipital lobe CTA NECK Skeleton: Prior sternotomy. No acute osseous abnormality identified. Upper chest: Mild dependent atelectasis. No superior mediastinal lymphadenopathy. Mild gas and fluid distension of the thoracic esophagus. Other neck: Negative.  No neck mass or lymphadenopathy. Aortic arch: Mild bovine type arch configuration. Calcified arch atherosclerosis. Right carotid system: Tortuous brachiocephalic artery and proximal right CCA with a kinked appearance but no atherosclerotic stenosis. Soft and calcified plaque in the distal right CCA proximal to the bifurcation without stenosis. Bulky calcified plaque at the right carotid bifurcation, but less than 50 % stenosis with respect to the distal vessel at the right ICA origin. Coarse calcified plaque continues into the distal bulb, but stenosis remains less than 50 % with respect to the distal vessel. Patent right ICA to the skull base. Left carotid system: No left CCA  origin stenosis. Mildly tortuous proximal left CCA. Occasional plaque proximal to the left carotid bifurcation without stenosis. Bulky calcified plaque at the left carotid bifurcation with short segment radiographic string sign stenosis (series 10, image 112). The left ICA remains patent with no additional stenosis to the skull base despite some calcified plaque.  Vertebral arteries: Tortuous proximal right subclavian artery without atherosclerotic stenosis. Right vertebral artery origin calcified plaque with moderate to severe stenosis. The right vertebral artery is non dominant with additional right V2 and V3 segment calcified plaque, but no other stenosis until the skull base. No proximal right subclavian artery stenosis despite plaque. Calcified plaque at the left vertebral artery origin with moderate to severe stenosis. Dominant left vertebral artery with additional calcified plaque in the V2 segments but no other stenosis to the skull base. CTA HEAD Posterior circulation: Calcified plaque in the right vertebral artery V4 segment with moderate to severe stenosis as the vessel crosses the dura and again just proximal to the right PICA origin which remains patent. This appears progressed since 2014. The right vertebral is patent to the vertebrobasilar junction. The dominant distal left vertebral artery demonstrates calcified plaque with only mild stenosis. Normal left PICA origin. Patent basilar artery with stable irregularity since 2014 mostly in the proximal 3rd. Mild associated stenosis. Patent SCA and PCA origins. Posterior communicating arteries are diminutive or absent. The right P1 and P2 segments are patent with mild irregularity. There is poor flow in the right P3 segments. The left PCA is patent with mild irregularity. Anterior circulation: Both ICA siphons are patent with extensive calcified atherosclerosis. There is moderate to severe short segment supraclinoid right ICA stenosis which may have  progressed since 2014 (series 11 image 78). The right ICA terminus remains patent. On the left there is moderate distal petrous segment stenosis due to calcified plaque. 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 There is moderate to severe anterior genu segment stenosis due to calcified plaque, and additional severe left supraclinoid segment stenosis due to calcified plaque. These appear progressed since 2014. The left ICA terminus remains patent. MCA and ACA origins are patent. ACA origins and A1 segments appear normal. Bilateral ACA branches are within normal limits. There is calcified plaque at the left MCA origin but no stenosis. The left M1 segment, left trifurcation, and left MCA branches are within normal limits. There is calcified plaque in the right MCA origin and M1 segment with only mild stenosis. The right MCA bifurcation and right MCA branches are patent with mild irregularity. No discrete right MCA branch occlusion is identified although there is decreased distal right MCA branch enhancement compared to the left side (series 12, image 13). Venous sinuses: Grossly patent, suboptimal timing for venous valuation. Anatomic variants: Dominant left vertebral artery. Mild bovine type arch configuration. Review of the MIP images confirms the above findings IMPRESSION: 1. Unreliable CBF results due to cytotoxic edema in the Right PCA territory. 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 This was discussed by telephone with Dr. Ritta Slot on 11/04/2017 at 1229 hours. 2. With regard to #1, there is poor flow in the right P3 divisions, but the right P1 and P2 segments remain patent. 3. Positive for severe atherosclerosis and multifocal other high-grade intra- and extracranial stenosis: - high-grade Left ICA origin radiographic string sign stenosis due to bulky calcified  plaque. - moderate to severe bilateral ICA siphon stenosis appears progressed since 2014 and is due to calcified plaque, with multifocal high-grade stenoses on the Left . - moderate to severe stenosis of the non dominant Right Vertebral Artery at both its origin and V4 segments. 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 4.  Aortic Atherosclerosis (  ICD10-I70.0). Electronically Signed   By: Odessa Fleming M.D.   On: 11/04/2017 00:52   Ct Angio Neck W Or Wo Contrast  Result Date: 11/04/2017 CLINICAL DATA:  77 year old male with left hemianopia. Unknown time of symptom onset. EXAM: CT ANGIOGRAPHY HEAD AND NECK CT PERFUSION BRAIN TECHNIQUE: Multidetector CT imaging of the head and neck was performed using the standard protocol during bolus administration of intravenous contrast. Multiplanar CT image reconstructions and MIPs were obtained to evaluate the vascular anatomy. Carotid stenosis measurements (when applicable) are obtained utilizing NASCET criteria, using the distal internal carotid diameter as the denominator. Multiphase CT imaging of the brain was performed following IV bolus contrast injection. Subsequent parametric perfusion maps were calculated using RAPID software. CONTRAST:  ISOVUE-370 IOPAMIDOL (ISOVUE-370) INJECTION 76% COMPARISON:  Head CT without contrast 0007 hours today. Brain MRI and MRA 03/19/2013 FINDINGS: CT Brain Perfusion Findings: CBF (<30%) Volume: zero, but erroneous - see next. Perfusion (Tmax>6.0s) volume: 30 mL, which corresponds to the cytotoxic edema seen on the 0007 hours noncontrast head CT today. Therefore, this is infarct core. Mismatch Volume: Probably zero Infarction Location:Right PCA, right occipital lobe CTA NECK Skeleton: Prior sternotomy. No acute osseous abnormality identified. Upper chest: Mild dependent atelectasis. No superior mediastinal lymphadenopathy. Mild gas and fluid distension of the thoracic esophagus. Other neck: Negative.  No neck  mass or lymphadenopathy. Aortic arch: Mild bovine type arch configuration. Calcified arch atherosclerosis. Right carotid system: Tortuous brachiocephalic artery and proximal right CCA with a kinked appearance but no atherosclerotic stenosis. Soft and calcified plaque in the distal right CCA proximal to the bifurcation without stenosis. Bulky calcified plaque at the right carotid bifurcation, but less than 50 % stenosis with respect to the distal vessel at the right ICA origin. Coarse calcified plaque continues into the distal bulb, but stenosis remains less than 50 % with respect to the distal vessel. Patent right ICA to the skull base. Left carotid system: No left CCA origin stenosis. Mildly tortuous proximal left CCA. Occasional plaque proximal to the left carotid bifurcation without stenosis. Bulky calcified plaque at the left carotid bifurcation with short segment radiographic string sign stenosis (series 10, image 112). The left ICA remains patent with no additional stenosis to the skull base despite some calcified plaque. Vertebral arteries: Tortuous proximal right subclavian artery without atherosclerotic stenosis. Right vertebral artery origin calcified plaque with moderate to severe stenosis. The right vertebral artery is non dominant with additional right V2 and V3 segment calcified plaque, but no other stenosis until the skull base. No proximal right subclavian artery stenosis despite plaque. Calcified plaque at the left vertebral artery origin with moderate to severe stenosis. Dominant left vertebral artery with additional calcified plaque in the V2 segments but no other stenosis to the skull base. CTA HEAD Posterior circulation: Calcified plaque in the right vertebral artery V4 segment with moderate to severe stenosis as the vessel crosses the dura and again just proximal to the right PICA origin which remains patent. This appears progressed since 2014. The right vertebral is patent to the  vertebrobasilar junction. The dominant distal left vertebral artery demonstrates calcified plaque with only mild stenosis. Normal left PICA origin. Patent basilar artery with stable irregularity since 2014 mostly in the proximal 3rd. Mild associated stenosis. Patent SCA and PCA origins. Posterior communicating arteries are diminutive or absent. The right P1 and P2 segments are patent with mild irregularity. There is poor flow in the right P3 segments. The left PCA is patent with mild irregularity. Anterior  circulation: Both ICA siphons are patent with extensive calcified atherosclerosis. There is moderate to severe short segment supraclinoid right ICA stenosis which may have progressed since 2014 (series 11 image 78). The right ICA terminus remains patent. On the left there is moderate distal petrous segment stenosis due to calcified plaque. 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 There is moderate to severe anterior genu segment stenosis due to calcified plaque, and additional severe left supraclinoid segment stenosis due to calcified plaque. These appear progressed since 2014. The left ICA terminus remains patent. MCA and ACA origins are patent. ACA origins and A1 segments appear normal. Bilateral ACA branches are within normal limits. There is calcified plaque at the left MCA origin but no stenosis. The left M1 segment, left trifurcation, and left MCA branches are within normal limits. There is calcified plaque in the right MCA origin and M1 segment with only mild stenosis. The right MCA bifurcation and right MCA branches are patent with mild irregularity. No discrete right MCA branch occlusion is identified although there is decreased distal right MCA branch enhancement compared to the left side (series 12, image 13). Venous sinuses: Grossly patent, suboptimal timing for venous valuation. Anatomic variants: Dominant left vertebral artery. Mild bovine type arch configuration.  Review of the MIP images confirms the above findings IMPRESSION: 1. Unreliable CBF results due to cytotoxic edema in the Right PCA territory. 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 This was discussed by telephone with Dr. Ritta Slot on 11/04/2017 at 1229 hours. 2. With regard to #1, there is poor flow in the right P3 divisions, but the right P1 and P2 segments remain patent. 3. Positive for severe atherosclerosis and multifocal other high-grade intra- and extracranial stenosis: - high-grade Left ICA origin radiographic string sign stenosis due to bulky calcified plaque. - moderate to severe bilateral ICA siphon stenosis appears progressed since 2014 and is due to calcified plaque, with multifocal high-grade stenoses on the Left . - moderate to severe stenosis of the non dominant Right Vertebral Artery at both its origin and V4 segments. 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 4.  Aortic Atherosclerosis (ICD10-I70.0). Electronically Signed   By: Odessa Fleming M.D.   On: 11/04/2017 00:52   Mr Brain Wo Contrast  Result Date: 11/04/2017 CLINICAL DATA:  77 y/o  M; evaluation of stroke. EXAM: MRI HEAD WITHOUT CONTRAST TECHNIQUE: Multiplanar, multiecho pulse sequences of the brain and surrounding structures were obtained without intravenous contrast. COMPARISON:  10/07/2017 CT head, CTA head, CT perfusion head. 03/19/2013 MRI head. FINDINGS: Brain: Right PCA distribution area of reduced diffusion involving the medial temporal lobe and occipital lobe compatible with acute/early subacute infarction. Few punctate foci of infarction are present in right cerebellum, right splenium of corpus callosum and right parietal lobe. Punctate foci of susceptibility hypointensity within right posteromedial temporal lobe (series 12,001 image 23) compatible with petechial hemorrhage. Extensive  confluentnonspecific foci of T2 FLAIR hyperintense signal abnormality in subcortical and periventricular white matter are compatible withseverechronic microvascular ischemic changes for age. Moderatebrain parenchymal volume loss. Small chronic infarcts are present within the bilateral cerebellar hemispheres, pons, left thalamus, and right posterior limb of internal capsule. No extra-axial collection, effacement of basilar cisterns, or significant mass effect. Vascular: Normal central flow voids. Skull and upper cervical spine: Normal marrow signal. Sinuses/Orbits: Moderate diffuse paranasal sinus mucosal thickening with small fluid levels  in the maxillary sinus. Trace opacification of left mastoid air cells. Bilateral intra-ocular lens replacement. Other: None. IMPRESSION: 1. Right PCA distribution acute/early subacute infarction involving medial temporal lobe and occipital lobe. Punctate foci of infarction in right cerebellum, splenium of corpus callosum, and parietal lobe. Small area of petechial hemorrhage in right posteromedial temporal lobe. No significant mass effect. 2. Severe chronic microvascular ischemic changes and moderate parenchymal volume loss of the brain. 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 3. Moderate paranasal sinus disease with fluid levels which may represent acute sinusitis. Electronically Signed   By: Mitzi Hansen M.D.   On: 11/04/2017 15:43   Ct Cerebral Perfusion W Contrast  Result Date: 11/04/2017 CLINICAL DATA:  77 year old male with left hemianopia. Unknown time of symptom onset. EXAM: CT ANGIOGRAPHY HEAD AND NECK CT PERFUSION BRAIN TECHNIQUE: Multidetector CT imaging of the head and neck was performed using the standard protocol during bolus administration of intravenous contrast. Multiplanar CT image reconstructions and MIPs were obtained to evaluate the vascular anatomy. Carotid stenosis measurements (when applicable) are obtained  utilizing NASCET criteria, using the distal internal carotid diameter as the denominator. Multiphase CT imaging of the brain was performed following IV bolus contrast injection. Subsequent parametric perfusion maps were calculated using RAPID software. CONTRAST:  ISOVUE-370 IOPAMIDOL (ISOVUE-370) INJECTION 76% COMPARISON:  Head CT without contrast 0007 hours today. Brain MRI and MRA 03/19/2013 FINDINGS: CT Brain Perfusion Findings: CBF (<30%) Volume: zero, but erroneous - see next. Perfusion (Tmax>6.0s) volume: 30 mL, which corresponds to the cytotoxic edema seen on the 0007 hours noncontrast head CT today. Therefore, this is infarct core. Mismatch Volume: Probably zero Infarction Location:Right PCA, right occipital lobe CTA NECK Skeleton: Prior sternotomy. No acute osseous abnormality identified. Upper chest: Mild dependent atelectasis. No superior mediastinal lymphadenopathy. Mild gas and fluid distension of the thoracic esophagus. Other neck: Negative.  No neck mass or lymphadenopathy. Aortic arch: Mild bovine type arch configuration. Calcified arch atherosclerosis. Right carotid system: Tortuous brachiocephalic artery and proximal right CCA with a kinked appearance but no atherosclerotic stenosis. Soft and calcified plaque in the distal right CCA proximal to the bifurcation without stenosis. Bulky calcified plaque at the right carotid bifurcation, but less than 50 % stenosis with respect to the distal vessel at the right ICA origin. Coarse calcified plaque continues into the distal bulb, but stenosis remains less than 50 % with respect to the distal vessel. Patent right ICA to the skull base. Left carotid system: No left CCA origin stenosis. Mildly tortuous proximal left CCA. Occasional plaque proximal to the left carotid bifurcation without stenosis. Bulky calcified plaque at the left carotid bifurcation with short segment radiographic string sign stenosis (series 10, image 112). The left ICA remains  patent with no additional stenosis to the skull base despite some calcified plaque. Vertebral arteries: Tortuous proximal right subclavian artery without atherosclerotic stenosis. Right vertebral artery origin calcified plaque with moderate to severe stenosis. The right vertebral artery is non dominant with additional right V2 and V3 segment calcified plaque, but no other stenosis until the skull base. No proximal right subclavian artery stenosis despite plaque. Calcified plaque at the left vertebral artery origin with moderate to severe stenosis. Dominant left vertebral artery with additional calcified plaque in the V2 segments but no other stenosis to the skull base. CTA HEAD Posterior circulation: Calcified plaque in the right vertebral artery V4 segment with moderate to severe stenosis as the vessel crosses the dura and again just proximal to the right  PICA origin which remains patent. This appears progressed since 2014. The right vertebral is patent to the vertebrobasilar junction. The dominant distal left vertebral artery demonstrates calcified plaque with only mild stenosis. Normal left PICA origin. Patent basilar artery with stable irregularity since 2014 mostly in the proximal 3rd. Mild associated stenosis. Patent SCA and PCA origins. Posterior communicating arteries are diminutive or absent. The right P1 and P2 segments are patent with mild irregularity. There is poor flow in the right P3 segments. The left PCA is patent with mild irregularity. Anterior circulation: Both ICA siphons are patent with extensive calcified atherosclerosis. There is moderate to severe short segment supraclinoid right ICA stenosis which may have progressed since 2014 (series 11 image 78). The right ICA terminus remains patent. On the left there is moderate distal petrous segment stenosis due to calcified plaque. 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 There is moderate to severe anterior genu  segment stenosis due to calcified plaque, and additional severe left supraclinoid segment stenosis due to calcified plaque. These appear progressed since 2014. The left ICA terminus remains patent. MCA and ACA origins are patent. ACA origins and A1 segments appear normal. Bilateral ACA branches are within normal limits. There is calcified plaque at the left MCA origin but no stenosis. The left M1 segment, left trifurcation, and left MCA branches are within normal limits. There is calcified plaque in the right MCA origin and M1 segment with only mild stenosis. The right MCA bifurcation and right MCA branches are patent with mild irregularity. No discrete right MCA branch occlusion is identified although there is decreased distal right MCA branch enhancement compared to the left side (series 12, image 13). Venous sinuses: Grossly patent, suboptimal timing for venous valuation. Anatomic variants: Dominant left vertebral artery. Mild bovine type arch configuration. Review of the MIP images confirms the above findings IMPRESSION: 1. Unreliable CBF results due to cytotoxic edema in the Right PCA territory. 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 This was discussed by telephone with Dr. Ritta Slot on 11/04/2017 at 1229 hours. 2. With regard to #1, there is poor flow in the right P3 divisions, but the right P1 and P2 segments remain patent. 3. Positive for severe atherosclerosis and multifocal other high-grade intra- and extracranial stenosis: - high-grade Left ICA origin radiographic string sign stenosis due to bulky calcified plaque. - moderate to severe bilateral ICA siphon stenosis appears progressed since 2014 and is due to calcified plaque, with multifocal high-grade stenoses on the Left . - moderate to severe stenosis of the non dominant Right Vertebral Artery at both its origin and V4 segments. - moderate  to severe dominant Left Vertebral Artery origin stenosis. 4.  Aortic Atherosclerosis (ICD10-I70.0). Electronically Signed   By: Odessa Fleming M.D.   On: 11/04/2017 00:52   Ct Head Code Stroke Wo Contrast  Result Date: 11/04/2017 CLINICAL DATA:  Code stroke. 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 Last known normal 1900 hours. EXAM: CT HEAD WITHOUT CONTRAST TECHNIQUE: Contiguous axial images were obtained from the base of the skull through the vertex without intravenous contrast. COMPARISON:  Brain MRI, intracranial MRA, and noncontrast head CT 03/19/2013 FINDINGS: Brain: Similar cerebral volume since 2014. 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 No cortical encephalomalacia identified, but there is abnormal hypodensity in the medial right occipital lobe on series 3, image 15  and coronal image 47. No associated hemorrhage or mass effect. No other cytotoxic edema identified. No midline shift, ventriculomegaly, mass effect, evidence of mass lesion, or acute intracranial hemorrhage. Vascular: Extensive calcified atherosclerosis at the skull base. Questionable abnormal hyperdensity of the right PCA on series 5, image 42. Skull: No acute osseous abnormality identified. Sinuses/Orbits: Widespread paranasal sinus mucosal thickening and fluid levels are largely new since 2014. The bilateral tympanic cavities and mastoids remain clear. Other: No acute orbit or scalp soft tissue findings. Orbits soft tissues appears stable since 2014. ASPECTS [facility] Stroke Program Early CT Score) - Ganglionic level infarction (caudate, lentiform nuclei, internal capsule, insula, M1-M3 cortex): 7 - Supraganglionic infarction (M4-M6 cortex): 3 Total score (0-10 with 10 being normal): 10 IMPRESSION: 1. 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 2. Suspicion of hyperdense right PCA indicating occlusion. 3.  Underlying advanced chronic small vessel disease. 4. These results were communicated to Dr. Amada Jupiter at 12:22 amon 3/25/2019by text page via the [facility] messaging system. Electronically Signed   By: Odessa Fleming M.D.   On: 11/04/2017 00:23        Scheduled Meds: . aspirin EC  325 mg Oral Daily  . atorvastatin  80 mg Oral Daily  . clopidogrel  75 mg Oral Q breakfast  . enoxaparin (LOVENOX) injection  40 mg Subcutaneous Q24H  . folic acid  1 mg Oral Daily  . hydrochlorothiazide  12.5 mg Oral Daily  . loratadine  10 mg Oral Daily  . omega-3 acid ethyl esters  1 g Oral Daily   Continuous Infusions: . cefTRIAXone (ROCEPHIN)  IV Stopped (11/05/17 0962)     LOS: 1 day    Time spent: 35 min    Joseph Art, DO [facility] Pager [phone]  If 7PM-7AM, please contact night-coverage [email or web address] Password [facility] 11/05/2017, 3:15 PM

## 2017-11-05 NOTE — H&P (View-Only) (Signed)
ELECTROPHYSIOLOGY CONSULT NOTE  Patient ID: Thomas [name]. MRN: [id number], DOB/AGE: 77-Oct-1942   Admit date: 11/03/2017 Date of Consult: 11/05/2017  Primary Physician: Shirline Frees, NP Primary Cardiologist: Dr Eden Emms Reason for Consultation: Cryptogenic stroke; recommendations regarding Implantable Loop Recorder (requested by Dr Roda Shutters)  History of Present Illness Thomas [name]. was admitted on 11/03/2017 with acute CVA. he has been monitored on telemetry which has demonstrated no arrhythmias. No cause has been identified. Inpatient stroke work-up is to be completed with a TEE.  He has had prior TIA in 2014 also.   EP has been asked to evaluate for placement of an implantable loop recorder to monitor for atrial fibrillation.  Past Medical History Past Medical History:  Diagnosis Date  . CHF (congestive heart failure) (HCC) 1999  . CKD (chronic kidney disease), stage II   . Coronary artery disease   . Hx of colonic polyps   . Hyperlipidemia   . Hyperplastic colon polyp 04/15/2007  . Hypertension   . Prostatic hypertrophy, benign    with elevated PSA  . TIA (transient ischemic attack) 2003   "mini stroke" (03/19/2013)    Past Surgical History Past Surgical History:  Procedure Laterality Date  . CARDIAC CATHETERIZATION    . CATARACT EXTRACTION W/ INTRAOCULAR LENS  IMPLANT, BILATERAL Bilateral 1998  . COLONOSCOPY  2008  . CORONARY ARTERY BYPASS GRAFT  1999   x4    Allergies/Intolerances Allergies  Allergen Reactions  . Sulfonamide Derivatives     REACTION: Weak,lethargic   Inpatient Medications . aspirin EC  325 mg Oral Daily  . atorvastatin  80 mg Oral Daily  . clopidogrel  75 mg Oral Q breakfast  . enoxaparin (LOVENOX) injection  40 mg Subcutaneous Q24H  . folic acid  1 mg Oral Daily  . loratadine  10 mg Oral Daily  . omega-3 acid ethyl esters  1 g Oral Daily   . sodium chloride    . sodium chloride 100 mL/hr at 11/04/17 1932  . cefTRIAXone (ROCEPHIN)  IV  Stopped (11/05/17 4098)   Social History Social History   Socioeconomic History  . Marital status: Married    Spouse name: cathy  . Number of children: 2  . Years of education: college  . Highest education level: Not on file  Occupational History  . Occupation: retired  Engineer, production  . Financial resource strain: Not on file  . Food insecurity:    Worry: Not on file    Inability: Not on file  . Transportation needs:    Medical: Not on file    Non-medical: Not on file  Tobacco Use  . Smoking status: Former Smoker    Packs/day: 0.75    Years: 50.00    Pack years: 37.50    Types: Cigarettes    Last attempt to quit: 03/19/2013    Years since quitting: 4.6  . Smokeless tobacco: Never Used  Substance and Sexual Activity  . Alcohol use: Yes    Alcohol/week: 0.6 oz    Types: 1 Cans of beer per week    Comment: 03/19/2013 "1 beer/week" does not drink anymore  . Drug use: No  . Sexual activity: Not Currently  Lifestyle  . Physical activity:    Days per week: Not on file    Minutes per session: Not on file  . Stress: Not on file  Relationships  . Social connections:    Talks on phone: Not on file    Gets together: Not on  file    Attends religious service: Not on file    Active member of club or organization: Not on file    Attends meetings of clubs or organizations: Not on file    Relationship status: Not on file  . Intimate partner violence:    Fear of current or ex partner: Not on file    Emotionally abused: Not on file    Physically abused: Not on file    Forced sexual activity: Not on file  Other Topics Concern  . Not on file  Social History Narrative   Patient lives at home with wife [name])   Retired.   Education one year of college.   Right handed.   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     Review of Systems General: No chills, fever, night sweats or weight changes  Cardiovascular:  No chest pain, dyspnea on exertion, edema, orthopnea, palpitations, paroxysmal  nocturnal dyspnea Dermatological: No rash, lesions or masses Respiratory: No cough, dyspnea Urologic: No hematuria, dysuria Abdominal: No nausea, vomiting, diarrhea, bright red blood per rectum, melena, or hematemesis Neurologic: No visual changes, weakness, changes in mental status All other systems reviewed and are otherwise negative except as noted above.  Physical Exam Blood pressure (!) 165/78, pulse 98, temperature 98.3 F (36.8 C), temperature source Oral, resp. rate 16, height 5\' 11"  (1.803 m), weight 166 lb 7.2 oz (75.5 kg), SpO2 97 %.  General: Well developed, well appearing 77 y.o. male in no acute distress. HEENT: Normocephalic, atraumatic. EOMs intact. Sclera nonicteric. Oropharynx clear.  Neck: Supple without bruits. No JVD. Lungs: Respirations regular and unlabored, CTA bilaterally. No wheezes, rales or rhonchi. Heart: RRR. S1, S2 present. No murmurs, rub, S3 or S4. Abdomen: Soft, non-tender, non-distended. BS present x 4 quadrants. No hepatosplenomegaly.  Extremities: No clubbing, cyanosis or edema. DP/PT/Radials 2+ and equal bilaterally. Psych: Normal affect. Musculoskeletal: No kyphosis. Skin: Intact. Warm and dry. No rashes or petechiae in exposed areas.   Labs Lab Results  Component Value Date   WBC 9.2 11/05/2017   HGB 12.3 (L) 11/05/2017   HCT 36.6 (L) 11/05/2017   MCV 90.6 11/05/2017   PLT 183 11/05/2017    Recent Labs  Lab 11/03/17 2343  11/05/17 0410  NA 137   < > 135  K 4.2   < > 3.7  CL 106   < > 105  CO2 20*  --  21*  BUN 30*   < > 13  CREATININE 1.80*   < > 0.96  CALCIUM 8.7*  --  8.4*  PROT 6.5  --   --   BILITOT 0.7  --   --   ALKPHOS 96  --   --   ALT 13*  --   --   AST 24  --   --   GLUCOSE 119*   < > 128*   < > = values in this interval not displayed.   Recent Labs    11/03/17 2343  INR 1.13    Radiology/Studies Ct Angio Head W Or Wo Contrast  Result Date: 11/04/2017 CLINICAL DATA:  77 year old male with left hemianopia.  Unknown time of symptom onset. EXAM: CT ANGIOGRAPHY HEAD AND NECK CT PERFUSION BRAIN TECHNIQUE: Multidetector CT imaging of the head and neck was performed using the standard protocol during bolus administration of intravenous contrast. Multiplanar CT image reconstructions and MIPs were obtained to evaluate the vascular anatomy. Carotid stenosis measurements (when applicable) are obtained utilizing NASCET criteria, using the distal internal carotid  diameter as the denominator. Multiphase CT imaging of the brain was performed following IV bolus contrast injection. Subsequent parametric perfusion maps were calculated using RAPID software. CONTRAST:  ISOVUE-370 IOPAMIDOL (ISOVUE-370) INJECTION 76% COMPARISON:  Head CT without contrast 0007 hours today. Brain MRI and MRA 03/19/2013 FINDINGS: CT Brain Perfusion Findings: CBF (<30%) Volume: zero, but erroneous - see next. Perfusion (Tmax>6.0s) volume: 30 mL, which corresponds to the cytotoxic edema seen on the 0007 hours noncontrast head CT today. Therefore, this is infarct core. Mismatch Volume: Probably zero Infarction Location:Right PCA, right occipital lobe CTA NECK Skeleton: Prior sternotomy. No acute osseous abnormality identified. Upper chest: Mild dependent atelectasis. No superior mediastinal lymphadenopathy. Mild gas and fluid distension of the thoracic esophagus. Other neck: Negative.  No neck mass or lymphadenopathy. Aortic arch: Mild bovine type arch configuration. Calcified arch atherosclerosis. Right carotid system: Tortuous brachiocephalic artery and proximal right CCA with a kinked appearance but no atherosclerotic stenosis. Soft and calcified plaque in the distal right CCA proximal to the bifurcation without stenosis. Bulky calcified plaque at the right carotid bifurcation, but less than 50 % stenosis with respect to the distal vessel at the right ICA origin. Coarse calcified plaque continues into the distal bulb, but stenosis remains less than 50  % with respect to the distal vessel. Patent right ICA to the skull base. Left carotid system: No left CCA origin stenosis. Mildly tortuous proximal left CCA. Occasional plaque proximal to the left carotid bifurcation without stenosis. Bulky calcified plaque at the left carotid bifurcation with short segment radiographic string sign stenosis (series 10, image 112). The left ICA remains patent with no additional stenosis to the skull base despite some calcified plaque. Vertebral arteries: Tortuous proximal right subclavian artery without atherosclerotic stenosis. Right vertebral artery origin calcified plaque with moderate to severe stenosis. The right vertebral artery is non dominant with additional right V2 and V3 segment calcified plaque, but no other stenosis until the skull base. No proximal right subclavian artery stenosis despite plaque. Calcified plaque at the left vertebral artery origin with moderate to severe stenosis. Dominant left vertebral artery with additional calcified plaque in the V2 segments but no other stenosis to the skull base. CTA HEAD Posterior circulation: Calcified plaque in the right vertebral artery V4 segment with moderate to severe stenosis as the vessel crosses the dura and again just proximal to the right PICA origin which remains patent. This appears progressed since 2014. The right vertebral is patent to the vertebrobasilar junction. The dominant distal left vertebral artery demonstrates calcified plaque with only mild stenosis. Normal left PICA origin. Patent basilar artery with stable irregularity since 2014 mostly in the proximal 3rd. Mild associated stenosis. Patent SCA and PCA origins. Posterior communicating arteries are diminutive or absent. The right P1 and P2 segments are patent with mild irregularity. There is poor flow in the right P3 segments. The left PCA is patent with mild irregularity. Anterior circulation: Both ICA siphons are patent with extensive calcified  atherosclerosis. There is moderate to severe short segment supraclinoid right ICA stenosis which may have progressed since 2014 (series 11 image 78). The right ICA terminus remains patent. On the left there is moderate distal petrous segment stenosis due to calcified plaque. 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 There is moderate to severe anterior genu segment stenosis due to calcified plaque, and additional severe left supraclinoid segment stenosis due to calcified plaque. These appear progressed since 2014. The left ICA terminus remains patent. MCA and ACA  origins are patent. ACA origins and A1 segments appear normal. Bilateral ACA branches are within normal limits. There is calcified plaque at the left MCA origin but no stenosis. The left M1 segment, left trifurcation, and left MCA branches are within normal limits. There is calcified plaque in the right MCA origin and M1 segment with only mild stenosis. The right MCA bifurcation and right MCA branches are patent with mild irregularity. No discrete right MCA branch occlusion is identified although there is decreased distal right MCA branch enhancement compared to the left side (series 12, image 13). Venous sinuses: Grossly patent, suboptimal timing for venous valuation. Anatomic variants: Dominant left vertebral artery. Mild bovine type arch configuration. Review of the MIP images confirms the above findings IMPRESSION: 1. Unreliable CBF results due to cytotoxic edema in the Right PCA territory. 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 This was discussed by telephone with Dr. Ritta Slot on 11/04/2017 at 1229 hours. 2. With regard to #1, there is poor flow in the right P3 divisions, but the right P1 and P2 segments remain patent. 3. Positive for severe atherosclerosis and multifocal other high-grade intra- and  extracranial stenosis: - high-grade Left ICA origin radiographic string sign stenosis due to bulky calcified plaque. - moderate to severe bilateral ICA siphon stenosis appears progressed since 2014 and is due to calcified plaque, with multifocal high-grade stenoses on the Left . - moderate to severe stenosis of the non dominant Right Vertebral Artery at both its origin and V4 segments. 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 4.  Aortic Atherosclerosis (ICD10-I70.0). Electronically Signed   By: Odessa Fleming M.D.   On: 11/04/2017 00:52   Ct Angio Neck W Or Wo Contrast  Result Date: 11/04/2017 CLINICAL DATA:  77 year old male with left hemianopia. Unknown time of symptom onset. EXAM: CT ANGIOGRAPHY HEAD AND NECK CT PERFUSION BRAIN TECHNIQUE: Multidetector CT imaging of the head and neck was performed using the standard protocol during bolus administration of intravenous contrast. Multiplanar CT image reconstructions and MIPs were obtained to evaluate the vascular anatomy. Carotid stenosis measurements (when applicable) are obtained utilizing NASCET criteria, using the distal internal carotid diameter as the denominator. Multiphase CT imaging of the brain was performed following IV bolus contrast injection. Subsequent parametric perfusion maps were calculated using RAPID software. CONTRAST:  ISOVUE-370 IOPAMIDOL (ISOVUE-370) INJECTION 76% COMPARISON:  Head CT without contrast 0007 hours today. Brain MRI and MRA 03/19/2013 FINDINGS: CT Brain Perfusion Findings: CBF (<30%) Volume: zero, but erroneous - see next. Perfusion (Tmax>6.0s) volume: 30 mL, which corresponds to the cytotoxic edema seen on the 0007 hours noncontrast head CT today. Therefore, this is infarct core. Mismatch Volume: Probably zero Infarction Location:Right PCA, right occipital lobe CTA NECK Skeleton: Prior sternotomy. No acute osseous abnormality identified. Upper chest: Mild dependent atelectasis. No superior  mediastinal lymphadenopathy. Mild gas and fluid distension of the thoracic esophagus. Other neck: Negative.  No neck mass or lymphadenopathy. Aortic arch: Mild bovine type arch configuration. Calcified arch atherosclerosis. Right carotid system: Tortuous brachiocephalic artery and proximal right CCA with a kinked appearance but no atherosclerotic stenosis. Soft and calcified plaque in the distal right CCA proximal to the bifurcation without stenosis. Bulky calcified plaque at the right carotid bifurcation, but less than 50 % stenosis with respect to the distal vessel at the right ICA origin. Coarse calcified plaque continues into the distal bulb,  but stenosis remains less than 50 % with respect to the distal vessel. Patent right ICA to the skull base. Left carotid system: No left CCA origin stenosis. Mildly tortuous proximal left CCA. Occasional plaque proximal to the left carotid bifurcation without stenosis. Bulky calcified plaque at the left carotid bifurcation with short segment radiographic string sign stenosis (series 10, image 112). The left ICA remains patent with no additional stenosis to the skull base despite some calcified plaque. Vertebral arteries: Tortuous proximal right subclavian artery without atherosclerotic stenosis. Right vertebral artery origin calcified plaque with moderate to severe stenosis. The right vertebral artery is non dominant with additional right V2 and V3 segment calcified plaque, but no other stenosis until the skull base. No proximal right subclavian artery stenosis despite plaque. Calcified plaque at the left vertebral artery origin with moderate to severe stenosis. Dominant left vertebral artery with additional calcified plaque in the V2 segments but no other stenosis to the skull base. CTA HEAD Posterior circulation: Calcified plaque in the right vertebral artery V4 segment with moderate to severe stenosis as the vessel crosses the dura and again just proximal to the right  PICA origin which remains patent. This appears progressed since 2014. The right vertebral is patent to the vertebrobasilar junction. The dominant distal left vertebral artery demonstrates calcified plaque with only mild stenosis. Normal left PICA origin. Patent basilar artery with stable irregularity since 2014 mostly in the proximal 3rd. Mild associated stenosis. Patent SCA and PCA origins. Posterior communicating arteries are diminutive or absent. The right P1 and P2 segments are patent with mild irregularity. There is poor flow in the right P3 segments. The left PCA is patent with mild irregularity. Anterior circulation: Both ICA siphons are patent with extensive calcified atherosclerosis. There is moderate to severe short segment supraclinoid right ICA stenosis which may have progressed since 2014 (series 11 image 78). The right ICA terminus remains patent. On the left there is moderate distal petrous segment stenosis due to calcified plaque. 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 There is moderate to severe anterior genu segment stenosis due to calcified plaque, and additional severe left supraclinoid segment stenosis due to calcified plaque. These appear progressed since 2014. The left ICA terminus remains patent. MCA and ACA origins are patent. ACA origins and A1 segments appear normal. Bilateral ACA branches are within normal limits. There is calcified plaque at the left MCA origin but no stenosis. The left M1 segment, left trifurcation, and left MCA branches are within normal limits. There is calcified plaque in the right MCA origin and M1 segment with only mild stenosis. The right MCA bifurcation and right MCA branches are patent with mild irregularity. No discrete right MCA branch occlusion is identified although there is decreased distal right MCA branch enhancement compared to the left side (series 12, image 13). Venous sinuses: Grossly patent, suboptimal timing  for venous valuation. Anatomic variants: Dominant left vertebral artery. Mild bovine type arch configuration. Review of the MIP images confirms the above findings IMPRESSION: 1. Unreliable CBF results due to cytotoxic edema in the Right PCA territory. 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 This was discussed by telephone with Dr. Ritta Slot on 11/04/2017 at 1229 hours. 2. With regard to #1, there is poor flow in the right P3 divisions, but the right P1 and P2 segments remain patent. 3. Positive for severe atherosclerosis and multifocal  other high-grade intra- and extracranial stenosis: - high-grade Left ICA origin radiographic string sign stenosis due to bulky calcified plaque. - moderate to severe bilateral ICA siphon stenosis appears progressed since 2014 and is due to calcified plaque, with multifocal high-grade stenoses on the Left . - moderate to severe stenosis of the non dominant Right Vertebral Artery at both its origin and V4 segments. 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 4.  Aortic Atherosclerosis (ICD10-I70.0). Electronically Signed   By: Odessa Fleming M.D.   On: 11/04/2017 00:52   Mr Brain Wo Contrast  Result Date: 11/04/2017 CLINICAL DATA:  77 y/o  M; evaluation of stroke. EXAM: MRI HEAD WITHOUT CONTRAST TECHNIQUE: Multiplanar, multiecho pulse sequences of the brain and surrounding structures were obtained without intravenous contrast. COMPARISON:  10/07/2017 CT head, CTA head, CT perfusion head. 03/19/2013 MRI head. FINDINGS: Brain: Right PCA distribution area of reduced diffusion involving the medial temporal lobe and occipital lobe compatible with acute/early subacute infarction. Few punctate foci of infarction are present in right cerebellum, right splenium of corpus callosum and right parietal lobe. Punctate foci of susceptibility hypointensity within right  posteromedial temporal lobe (series 12,001 image 23) compatible with petechial hemorrhage. Extensive confluentnonspecific foci of T2 FLAIR hyperintense signal abnormality in subcortical and periventricular white matter are compatible withseverechronic microvascular ischemic changes for age. Moderatebrain parenchymal volume loss. Small chronic infarcts are present within the bilateral cerebellar hemispheres, pons, left thalamus, and right posterior limb of internal capsule. No extra-axial collection, effacement of basilar cisterns, or significant mass effect. Vascular: Normal central flow voids. Skull and upper cervical spine: Normal marrow signal. Sinuses/Orbits: Moderate diffuse paranasal sinus mucosal thickening with small fluid levels in the maxillary sinus. Trace opacification of left mastoid air cells. Bilateral intra-ocular lens replacement. Other: None. IMPRESSION: 1. Right PCA distribution acute/early subacute infarction involving medial temporal lobe and occipital lobe. Punctate foci of infarction in right cerebellum, splenium of corpus callosum, and parietal lobe. Small area of petechial hemorrhage in right posteromedial temporal lobe. No significant mass effect. 2. Severe chronic microvascular ischemic changes and moderate parenchymal volume loss of the brain. 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 3. Moderate paranasal sinus disease with fluid levels which may represent acute sinusitis. Electronically Signed   By: Mitzi Hansen M.D.   On: 11/04/2017 15:43   Ct Cerebral Perfusion W Contrast  Result Date: 11/04/2017 CLINICAL DATA:  77 year old male with left hemianopia. Unknown time of symptom onset. EXAM: CT ANGIOGRAPHY HEAD AND NECK CT PERFUSION BRAIN TECHNIQUE: Multidetector CT imaging of the head and neck was performed using the standard protocol during bolus administration of intravenous contrast. Multiplanar CT image reconstructions and MIPs were obtained  to evaluate the vascular anatomy. Carotid stenosis measurements (when applicable) are obtained utilizing NASCET criteria, using the distal internal carotid diameter as the denominator. Multiphase CT imaging of the brain was performed following IV bolus contrast injection. Subsequent parametric perfusion maps were calculated using RAPID software. CONTRAST:  ISOVUE-370 IOPAMIDOL (ISOVUE-370) INJECTION 76% COMPARISON:  Head CT without contrast 0007 hours today. Brain MRI and MRA 03/19/2013 FINDINGS: CT Brain Perfusion Findings: CBF (<30%) Volume: zero, but erroneous - see next. Perfusion (Tmax>6.0s) volume: 30 mL, which corresponds to the cytotoxic edema seen on the 0007 hours noncontrast head CT today. Therefore, this is infarct core. Mismatch Volume: Probably zero Infarction Location:Right PCA, right occipital lobe CTA NECK Skeleton: Prior sternotomy. No acute osseous abnormality identified. Upper chest: Mild dependent atelectasis. No superior mediastinal lymphadenopathy. Mild  gas and fluid distension of the thoracic esophagus. Other neck: Negative.  No neck mass or lymphadenopathy. Aortic arch: Mild bovine type arch configuration. Calcified arch atherosclerosis. Right carotid system: Tortuous brachiocephalic artery and proximal right CCA with a kinked appearance but no atherosclerotic stenosis. Soft and calcified plaque in the distal right CCA proximal to the bifurcation without stenosis. Bulky calcified plaque at the right carotid bifurcation, but less than 50 % stenosis with respect to the distal vessel at the right ICA origin. Coarse calcified plaque continues into the distal bulb, but stenosis remains less than 50 % with respect to the distal vessel. Patent right ICA to the skull base. Left carotid system: No left CCA origin stenosis. Mildly tortuous proximal left CCA. Occasional plaque proximal to the left carotid bifurcation without stenosis. Bulky calcified plaque at the left carotid bifurcation with  short segment radiographic string sign stenosis (series 10, image 112). The left ICA remains patent with no additional stenosis to the skull base despite some calcified plaque. Vertebral arteries: Tortuous proximal right subclavian artery without atherosclerotic stenosis. Right vertebral artery origin calcified plaque with moderate to severe stenosis. The right vertebral artery is non dominant with additional right V2 and V3 segment calcified plaque, but no other stenosis until the skull base. No proximal right subclavian artery stenosis despite plaque. Calcified plaque at the left vertebral artery origin with moderate to severe stenosis. Dominant left vertebral artery with additional calcified plaque in the V2 segments but no other stenosis to the skull base. CTA HEAD Posterior circulation: Calcified plaque in the right vertebral artery V4 segment with moderate to severe stenosis as the vessel crosses the dura and again just proximal to the right PICA origin which remains patent. This appears progressed since 2014. The right vertebral is patent to the vertebrobasilar junction. The dominant distal left vertebral artery demonstrates calcified plaque with only mild stenosis. Normal left PICA origin. Patent basilar artery with stable irregularity since 2014 mostly in the proximal 3rd. Mild associated stenosis. Patent SCA and PCA origins. Posterior communicating arteries are diminutive or absent. The right P1 and P2 segments are patent with mild irregularity. There is poor flow in the right P3 segments. The left PCA is patent with mild irregularity. Anterior circulation: Both ICA siphons are patent with extensive calcified atherosclerosis. There is moderate to severe short segment supraclinoid right ICA stenosis which may have progressed since 2014 (series 11 image 78). The right ICA terminus remains patent. On the left there is moderate distal petrous segment stenosis due to calcified plaque. 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 There is moderate to severe anterior genu segment stenosis due to calcified plaque, and additional severe left supraclinoid segment stenosis due to calcified plaque. These appear progressed since 2014. The left ICA terminus remains patent. MCA and ACA origins are patent. ACA origins and A1 segments appear normal. Bilateral ACA branches are within normal limits. There is calcified plaque at the left MCA origin but no stenosis. The left M1 segment, left trifurcation, and left MCA branches are within normal limits. There is calcified plaque in the right MCA origin and M1 segment with only mild stenosis. The right MCA bifurcation and right MCA branches are patent with mild irregularity. No discrete right MCA branch occlusion is identified although there is decreased distal right MCA branch enhancement compared to the left side (series 12, image 13). Venous sinuses: Grossly patent, suboptimal timing for venous valuation. Anatomic variants: Dominant left vertebral artery. Mild bovine type arch  configuration. Review of the MIP images confirms the above findings IMPRESSION: 1. Unreliable CBF results due to cytotoxic edema in the Right PCA territory. 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 This was discussed by telephone with Dr. Ritta Slot on 11/04/2017 at 1229 hours. 2. With regard to #1, there is poor flow in the right P3 divisions, but the right P1 and P2 segments remain patent. 3. Positive for severe atherosclerosis and multifocal other high-grade intra- and extracranial stenosis: - high-grade Left ICA origin radiographic string sign stenosis due to bulky calcified plaque. - moderate to severe bilateral ICA siphon stenosis appears progressed since 2014 and is due to calcified plaque, with multifocal high-grade stenoses on the Left . - moderate to severe  stenosis of the non dominant Right Vertebral Artery at both its origin and V4 segments. 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 4.  Aortic Atherosclerosis (ICD10-I70.0). Electronically Signed   By: Odessa Fleming M.D.   On: 11/04/2017 00:52   Ct Head Code Stroke Wo Contrast  Result Date: 11/04/2017 CLINICAL DATA:  Code stroke. 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 Last known normal 1900 hours. EXAM: CT HEAD WITHOUT CONTRAST TECHNIQUE: Contiguous axial images were obtained from the base of the skull through the vertex without intravenous contrast. COMPARISON:  Brain MRI, intracranial MRA, and noncontrast head CT 03/19/2013 FINDINGS: Brain: Similar cerebral volume since 2014. 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 No cortical encephalomalacia identified, but there is abnormal hypodensity in the medial right occipital lobe on series 3, image 15 and coronal image 47. No associated hemorrhage or mass effect. No other cytotoxic edema identified. No midline shift, ventriculomegaly, mass effect, evidence of mass lesion, or acute intracranial hemorrhage. Vascular: Extensive calcified atherosclerosis at the skull base. Questionable abnormal hyperdensity of the right PCA on series 5, image 42. Skull: No acute osseous abnormality identified. Sinuses/Orbits: Widespread paranasal sinus mucosal thickening and fluid levels are largely new since 2014. The bilateral tympanic cavities and mastoids remain clear. Other: No acute orbit or scalp soft tissue findings. Orbits soft tissues appears stable since 2014. ASPECTS [facility] Stroke Program Early CT Score) - Ganglionic level infarction (caudate, lentiform nuclei, internal capsule, insula, M1-M3 cortex): 7 - Supraganglionic infarction (M4-M6 cortex): 3 Total score (0-10 with 10 being normal): 10 IMPRESSION: 1. Positive for acute to subacute right PCA territory infarct with cytotoxic edema  but no hemorrhage or mass effect. 2. Suspicion of hyperdense right PCA indicating occlusion. 3. Underlying advanced chronic small vessel disease. 4. These results were communicated to Dr. Amada Jupiter at 12:22 amon 3/25/2019by text page via the [facility] messaging system. Electronically Signed   By: Odessa Fleming M.D.   On: 11/04/2017 00:23    Echocardiogram  10/14/17 reveals EF 55%, mild AI, moderate LA enlargement  12-lead ECG sinus Telemetry sinus with PVCs, no afib   Assessment and Plan 1. Cryptogenic stroke The patient presents with cryptogenic stroke.  He is making good recovery.  If the TEE is negative, I recommend loop recorder insertion to monitor for AF. The indication for loop recorder insertion / monitoring for AF in setting of cryptogenic stroke was discussed with the patient. The loop recorder insertion procedure was reviewed in detail including risks and benefits. These risks include but are not limited to bleeding and infection. The patient and his wife expressed verbal understanding and agrees to  proceed. The patient was also counseled regarding wound care and device follow-up.  Randolm Idol MD 11/05/2017, 7:30 AM

## 2017-11-05 NOTE — Discharge Instructions (Signed)
Implant site care instructions °Keep incision clean and dry for 3 days. °You can remove outer dressing tomorrow. °Leave steri-strips (little pieces of tape) on until seen in the office for wound check appointment. °Call the office ([phone]) for redness, drainage, swelling, or fever. ° °

## 2017-11-05 NOTE — Progress Notes (Signed)
LE venous duplex prelim: negative for DVT. Deloyce Walthers Eunice, RDMS, RVT  

## 2017-11-05 NOTE — Progress Notes (Signed)
Physical Therapy Treatment Patient Details Name: Thomas [name]. MRN: [id number] DOB: Aug 26, 1940 Today's Date: 11/05/2017    History of Present Illness Thomas Parkeris a 77 y.o.malewith medical history significant ofstroke with mild left leg weakness, hypertension, hyperlipidemia, CAD, CABG, dCHF, CKD-II, who presents with altered mental status and difficulty seeing out of left eye. MRI revealed infarct of R PCA territory.    PT Comments    Patient continues to present as a significant fall risk secondary to impaired balance, hemianopsia, slow gait speed, and anosognosia. Based on current gait speed of 1.3 ft/sec, indicates patient is at increased risk of falls and is a limited household ambulator. Demonstrates improved balance with RW compared to with HHA but continues to display left sided inattention due to visual field deficits despite maximal verbal cueing.  HR increase to 130 during ambulation. Discussed CIR with patient and patient wife extensively; patient wife verbalizes concern about patient safety at home currently. Continue to recommend CIR to maximize patient's functional independence and decrease fall risk.    Follow Up Recommendations  CIR;Supervision/Assistance - 24 hour     Equipment Recommendations  None recommended by PT    Recommendations for Other Services Rehab consult     Precautions / Restrictions Precautions Precautions: Fall Precaution Comments: L heminopsia Restrictions Weight Bearing Restrictions: No    Mobility  Bed Mobility Overal bed mobility: Modified Independent             General bed mobility comments: ModI with supine to sit  Transfers Overall transfer level: Needs assistance Equipment used: Rolling walker (2 wheeled);None Transfers: Sit to/from Stand Sit to Stand: Min assist         General transfer comment: Min assist with no AD in order to steady. Decreased eccentric control with stand to sit. Needs max VC's and  tactile cueing for safety to turn fully with hips squared before attempting stand to sit.   Ambulation/Gait Ambulation/Gait assistance: Min assist;Mod assist Ambulation Distance (Feet): 100 Feet Assistive device: Rolling walker (2 wheeled);1 person hand held assist Gait Pattern/deviations: Step-to pattern;Decreased stride length;Decreased stance time - left;Decreased step length - left;Decreased weight shift to left;Decreased dorsiflexion - left;Narrow base of support;Trunk flexed Gait velocity: slow Gait velocity interpretation: <1.8 ft/sec, indicative of risk for recurrent falls(1.3 ft/second) General Gait Details: Patient requiring mod assist with ambulation without an AD; 2 LOB due to staggering, narrow BOS, and significantly decreased stride length. Requiring minA with RW and VC's to promote upright posture and RW proximity. L knee hyperextension during stance phase, which patient reports is chronic. Patient cannot dual task; tends to stop completely when performing head turns/scanning environment.     Stairs            Wheelchair Mobility    Modified Rankin (Stroke Patients Only) Modified Rankin (Stroke Patients Only) Pre-Morbid Rankin Score: Moderate disability Modified Rankin: Moderately severe disability     Balance Overall balance assessment: Needs assistance Sitting-balance support: No upper extremity supported;Feet supported Sitting balance-Leahy Scale: Fair     Standing balance support: Single extremity supported Standing balance-Leahy Scale: Poor Standing balance comment: dependent on RW                            Cognition Arousal/Alertness: Awake/alert Behavior During Therapy: WFL for tasks assessed/performed Overall Cognitive Status: Impaired/Different from baseline Area of Impairment: Safety/judgement;Awareness;Problem solving  Safety/Judgement: Decreased awareness of deficits;Decreased awareness of  safety Awareness: Emergent Problem Solving: Slow processing;Difficulty sequencing;Requires verbal cues;Requires tactile cues General Comments: Pt with L sided inattention due to hemianopsia       Exercises      General Comments General comments (skin integrity, edema, etc.): Wife present during session. 5 times sit to stand (modified with use of BUE's): 44 seconds      Pertinent Vitals/Pain Pain Assessment: No/denies pain    Home Living                      Prior Function            PT Goals (current goals can now be found in the care plan section) Acute Rehab PT Goals Patient Stated Goal: home PT Goal Formulation: With patient/family Time For Goal Achievement: 11/11/17 Potential to Achieve Goals: Good Progress towards PT goals: Progressing toward goals    Frequency    Min 4X/week      PT Plan Current plan remains appropriate    Co-evaluation              AM-PAC PT "6 Clicks" Daily Activity  Outcome Measure  Difficulty turning over in bed (including adjusting bedclothes, sheets and blankets)?: A Lot Difficulty moving from lying on back to sitting on the side of the bed? : A Lot Difficulty sitting down on and standing up from a chair with arms (e.g., wheelchair, bedside commode, etc,.)?: Unable Help needed moving to and from a bed to chair (including a wheelchair)?: A Little Help needed walking in hospital room?: A Little Help needed climbing 3-5 steps with a railing? : A Lot 6 Click Score: 13    End of Session Equipment Utilized During Treatment: Gait belt Activity Tolerance: Patient tolerated treatment well Patient left: in chair;with call bell/phone within reach;with chair alarm set;with family/visitor present Nurse Communication: Mobility status PT Visit Diagnosis: Unsteadiness on feet (R26.81);Hemiplegia and hemiparesis Hemiplegia - Right/Left: Left Hemiplegia - dominant/non-dominant: Non-dominant     Time: 1610-9604 PT Time  Calculation (min) (ACUTE ONLY): 37 min  Charges:  $Gait Training: 8-22 mins $Therapeutic Activity: 8-22 mins                    G Codes:       Laurina Bustle, PT, DPT Acute Rehabilitation Services  Pager: [phone]    Vanetta Mulders 11/05/2017, 3:04 PM

## 2017-11-05 NOTE — Progress Notes (Signed)
Patient is NPO since Midnight will have TEE in AM, he also continues to complain about breathing difficulty I put him on 2 litre's [place] he appears to be sleeping comfortably.

## 2017-11-05 NOTE — CV Procedure (Signed)
     Transesophageal Echocardiogram Note  Hassen Reel [id number] 02/09/1941  Procedure: Transesophageal Echocardiogram Indications: stroke  Procedure Details Consent: Obtained Time Out: Verified patient identification, verified procedure, site/side was marked, verified correct patient position, special equipment/implants available, Radiology Safety Procedures followed,  medications/allergies/relevent history reviewed, required imaging and test results available.  Performed  Medications: During this procedure the patient is administered a total of Versed 4 mg and Fentanyl 75 mcg to achieve and maintain moderate conscious sedation.  The patient's heart rate, blood pressure, and oxygen saturation are monitored continuously during the procedure. The period of conscious sedation is 35 minutes, of which I was present face-to-face 100% of this time.  - Left ventricle: Systolic function was normal. The estimated   ejection fraction was in the range of 55% to 60%. Wall motion was   normal; there were no regional wall motion abnormalities. - Aorta: There was moderate non-mobile atheroma. - Mitral valve: Mildly calcified annulus. Mildly thickened leaflets   . There was mild regurgitation. - Left atrium: No evidence of thrombus in the atrial cavity or   appendage. No evidence of thrombus in the atrial cavity or   appendage. - Right atrium: No evidence of thrombus in the atrial cavity or   appendage. - Atrial septum: There was increased thickness of the septum,   consistent with lipomatous hypertrophy. No defect or patent   foramen ovale was identified.  Impressions:  - No cardiac source of emboli was indentified.    Complications: No apparent complications Patient did tolerate procedure well.  Tobias Alexander, MD, [facility] 11/05/2017, 10:22 AM

## 2017-11-05 NOTE — Care Management Note (Signed)
Case Management Note  Patient Details  Name: Thomas [name]. MRN: [id number] Date of Birth: 05-31-1941  Subjective/Objective:     Pt admitted with CVA. He is from home with his spouse.                Action/Plan: Recommendations are for CIR. Awaiting insurance authorization. CM following.  Expected Discharge Date:                  Expected Discharge Plan:  IP Rehab Facility  In-House Referral:     Discharge planning Services  CM Consult  Post Acute Care Choice:    Choice offered to:     DME Arranged:    DME Agency:     HH Arranged:    HH Agency:     Status of Service:  In process, will continue to follow  If discussed at Long Length of Stay Meetings, dates discussed:    Additional Comments:  Kermit Balo, RN 11/05/2017, 3:19 PM

## 2017-11-05 NOTE — Interval H&P Note (Signed)
History and Physical Interval Note:  11/05/2017 9:12 AM  Thomas [name].  has presented today for surgery, with the diagnosis of stroke  The various methods of treatment have been discussed with the patient and family. After consideration of risks, benefits and other options for treatment, the patient has consented to  Procedure(s): LOOP RECORDER INSERTION (N/A) as a surgical intervention .  The patient's history has been reviewed, patient examined, no change in status, stable for surgery.  I have reviewed the patient's chart and labs.  Questions were answered to the patient's satisfaction.     Hillis Range

## 2017-11-05 NOTE — PMR Pre-admission (Signed)
PMR Admission Coordinator Pre-Admission Assessment  Patient: Thomas [name]. is an 77 y.o., male MRN: [id number] DOB: [birth year] Height: 5\' 11"  (180.3 cm) Weight: 75.5 kg (166 lb 7.2 oz)              Insurance Information HMO:     PPO:      PCP:      IPA:      80/20:      OTHER: LPPO PRIMARY: [employer] Medicare       Policy#: [id number]      Subscriber: Self CM Name: Kathlen Brunswick       Phone#: [phone]     Fax#: [phone] Pre-Cert#: [id number] given by Jonny Ruiz on 11/06/17 for 7 days with updates due to the case manager by 4/2 if not requested earlier by fax    Employer: Retired Benefits:  Phone #: Verified online     Name: [email or web address] Eff. Date: 08/13/17     Deduct: $0      Out of Pocket Max: $3220      Life Max: N/A CIR: $150 a day, days 1-20      SNF: $0 a day, days 1-20; $50 a day, days 21-100 Outpatient: Necessity      Co-Pay: $20 Home Health: Necessity, 100%      Co-Pay: $0 DME: 80%     Co-Pay: 20% Providers: In-network  SECONDARY: None        Medicaid Application Date:       Case Manager:  Disability Application Date:       Case Worker:   Emergency Conservator, museum/gallery Information    Name Relation Home Work Mobile   Cart,[name] Spouse [phone]  [phone]     Current Medical History  Patient Admitting Diagnosis: Right PCA infarct   History of Present Illness: Thomas Parkeris a 76 y.o.malewith history of CKD, HTN, CHF, 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 History taken from chart review, patient, and wife.UDS negative.CT head reviewed, suggesting right PCA infarct. 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 Stroke work up initiated with DAPT recommended for embolic stroke due to unclear source. TEE showed EF 55-60%  with lipomatous hypertrophy of atrial septum and no thrombus noted. BLE dopplers were negative for DVT. Patient with resultant right dense hemiopsia with left neglect (improving)in setting of prior left hemiparesis. CIR recommended due to functional decline and patient admitted 11/06/17.    NIH Total: 2    Past Medical History  Past Medical History:  Diagnosis Date  . CHF (congestive heart failure) (HCC) 1999  . CKD (chronic kidney disease), stage II   . Coronary artery disease   . Hx of colonic polyps   . Hyperlipidemia   . Hyperplastic colon polyp 04/15/2007  . Hypertension   . Prostatic hypertrophy, benign    with elevated PSA  . TIA (transient ischemic attack) 2003   "mini stroke" (03/19/2013)    Family History  family history includes Coronary artery disease in his mother; Stroke in his father.  Prior Rehab/Hospitalizations:  Has the patient had major surgery during 100 days prior to admission? No  Current Medications   Current Facility-Administered Medications:  .  acetaminophen (TYLENOL) tablet 650 mg, 650 mg, Oral, Q4H PRN **OR** acetaminophen (TYLENOL) solution 650 mg, 650 mg, Per Tube, Q4H PRN **OR**  acetaminophen (TYLENOL) suppository 650 mg, 650 mg, Rectal, Q4H PRN, Allred, James, MD .  aspirin EC tablet 325 mg, 325 mg, Oral, Daily, Allred, James, MD, 325 mg at 11/05/17 1154 .  atorvastatin (LIPITOR) tablet 80 mg, 80 mg, Oral, Daily, Allred, James, MD, 80 mg at 11/05/17 1155 .  cephALEXin (KEFLEX) capsule 500 mg, 500 mg, Oral, Q12H, Vann, Jessica U, DO .  clopidogrel (PLAVIX) tablet 75 mg, 75 mg, Oral, Q breakfast, Allred, James, MD, 75 mg at 11/05/17 1155 .  enoxaparin (LOVENOX) injection 40 mg, 40 mg, Subcutaneous, Q24H, Allred, James, MD, 40 mg at 11/05/17 1200 .  folic acid (FOLVITE) tablet 1 mg, 1 mg, Oral, Daily, Allred, James, MD, 1 mg at 11/05/17 1154 .  guaiFENesin (MUCINEX) 12 hr tablet 1,200 mg, 1,200 mg, Oral, BID PRN, Hillis Range, MD, 1,200 mg at  11/05/17 1205 .  hydrALAZINE (APRESOLINE) injection 5 mg, 5 mg, Intravenous, Q2H PRN, Allred, James, MD .  hydrochlorothiazide (MICROZIDE) capsule 12.5 mg, 12.5 mg, Oral, Daily, Allred, James, MD, 12.5 mg at 11/05/17 1155 .  loratadine (CLARITIN) tablet 10 mg, 10 mg, Oral, Daily, Allred, James, MD, 10 mg at 11/05/17 1153 .  metoprolol tartrate (LOPRESSOR) tablet 50 mg, 50 mg, Oral, BID, Vann, Jessica U, DO, 50 mg at 11/05/17 2214 .  omega-3 acid ethyl esters (LOVAZA) capsule 1 g, 1 g, Oral, Daily, Allred, James, MD, 1 g at 11/05/17 1153 .  ondansetron (ZOFRAN) injection 4 mg, 4 mg, Intravenous, Q8H PRN, Allred, James, MD .  senna-docusate (Senokot-S) tablet 1 tablet, 1 tablet, Oral, QHS PRN, Allred, James, MD .  zolpidem (AMBIEN) tablet 5 mg, 5 mg, Oral, QHS PRN, Hillis Range, MD  Patients Current Diet: Fall precautions Diet Heart Room service appropriate? Yes; Fluid consistency: Thin Diet - low sodium heart healthy  Precautions / Restrictions Precautions Precautions: Fall Precaution Comments: L heminopsia Restrictions Weight Bearing Restrictions: No   Has the patient had 2 or more falls or a fall with injury in the past year?Yes  Prior Activity Level Community (5-7x/wk): Prior to admission patient was out in the community daily.  He enjoys going out to eat with friends, runnin errands with his spouse, and is active in his church.   Home Assistive Devices / Equipment Home Assistive Devices/Equipment: Environmental consultant (specify type), Cane (specify quad or straight), Dentures (specify type), Eyeglasses, Hearing aid Home Equipment: Walker - 4 wheels, Shower seat  Prior Device Use: Indicate devices/aids used by the patient prior to current illness, exacerbation or injury? Walker, Rollator   Prior Functional Level Prior Function Level of Independence: Independent with assistive device(s) Comments: pt uses rollator for ambulation, assists to get into tub however pt able to complete dressing and  bathing on own  Self Care: Did the patient need help bathing, dressing, using the toilet or eating? Needed some help  Indoor Mobility: Did the patient need assistance with walking from room to room (with or without device)? Independent  Stairs: Did the patient need assistance with internal or external stairs (with or without device)? Needed some help dependent with chair lift for 14 steps but could manage 2 steps in from garage with walker on his own with wife providing supervision   Functional Cognition: Did the patient need help planning regular tasks such as shopping or remembering to take medications? Needed some help  Current Functional Level Cognition  Overall Cognitive Status: Impaired/Different from baseline Orientation Level: Oriented X4 Safety/Judgement: Decreased awareness of deficits, Decreased awareness of safety General Comments: Pt with  L sided inattention due to hemianopsia     Extremity Assessment (includes Sensation/Coordination)  Upper Extremity Assessment: LUE deficits/detail LUE Deficits / Details: residual weakness from previous stroke. WFL for ADLs and FM tasks.   Lower Extremity Assessment: LLE deficits/detail LLE Deficits / Details: residual weakness from previous stroke    ADLs  Overall ADL's : Needs assistance/impaired Eating/Feeding: Set up, Sitting Eating/Feeding Details (indicate cue type and reason): Pt finishing breakfast upon arrival Grooming: Oral care, Wash/dry face, Standing, Moderate assistance, Cueing for sequencing Grooming Details (indicate cue type and reason): Pt requiring Mod A for posterior lean while up at sink. Pt unable to maintain static standing without UE support or physical A. Pt requiring Max cues to attend to left visual field. Pt present with left visual field cut and inattention. Pt unable to locate tooth brush or tooth paste on left side. Requiring Max cues to locate hot water faucet; pt repetatively turning on cold water and  requiring Max cues to stop cold water and locate hot water. Upper Body Bathing: Minimal assistance, Sitting Lower Body Bathing: Maximal assistance, Sit to/from stand Upper Body Dressing : Minimal assistance, Sitting Lower Body Dressing: Maximal assistance, Sit to/from stand Toilet Transfer: Moderate assistance, Ambulation, RW(Simulated to recliner) Toilet Transfer Details (indicate cue type and reason): Mod A for safe descent to recliner. Pt requiring multiple attempts to power up into standing Functional mobility during ADLs: Moderate assistance, Rolling walker General ADL Comments: Pt demonstrating decreased functional performance with deficits in balance, vision, and strength. Pt with poor awarness of deficits. Pt not noticing OT entered room due to left visual deficits.    Mobility  Overal bed mobility: Modified Independent General bed mobility comments: ModI with supine to sit    Transfers  Overall transfer level: Needs assistance Equipment used: Rolling walker (2 wheeled), None Transfers: Sit to/from Stand Sit to Stand: Min assist General transfer comment: Min assist with no AD in order to steady. Decreased eccentric control with stand to sit. Needs max VC's and tactile cueing for safety to turn fully with hips squared before attempting stand to sit.     Ambulation / Gait / Stairs / Wheelchair Mobility  Ambulation/Gait Ambulation/Gait assistance: Min assist, Mod assist Ambulation Distance (Feet): 100 Feet Assistive device: Rolling walker (2 wheeled), 1 person hand held assist Gait Pattern/deviations: Step-to pattern, Decreased stride length, Decreased stance time - left, Decreased step length - left, Decreased weight shift to left, Decreased dorsiflexion - left, Narrow base of support, Trunk flexed General Gait Details: Patient requiring mod assist with ambulation without an AD; 2 LOB due to staggering, narrow BOS, and significantly decreased stride length. Requiring minA with RW and  VC's to promote upright posture and RW proximity. L knee hyperextension during stance phase, which patient reports is chronic. Patient cannot dual task; tends to stop completely when performing head turns/scanning environment.   Gait velocity: slow Gait velocity interpretation: <1.8 ft/sec, indicative of risk for recurrent falls(1.3 ft/second)    Posture / Balance Balance Overall balance assessment: Needs assistance Sitting-balance support: No upper extremity supported, Feet supported Sitting balance-Leahy Scale: Fair Standing balance support: Single extremity supported Standing balance-Leahy Scale: Poor Standing balance comment: dependent on RW    Special needs/care consideration BiPAP/CPAP: No CPM: No Continuous Drip IV: No Dialysis: No         Life Vest: No Oxygen: Np Special Bed: No Trach Size: Np Wound Vac (area): No Skin: Dry and bruising per chart review  Bowel mgmt: Continent, last BM 11/03/17 Bladder mgmt: Intermittent incontinence, external catheter in place Diabetic mgmt: No, HgbA1c 5.8         Previous Home Environment Living Arrangements: Spouse/significant other Available Help at Discharge: Available 24 hours/day, Family Type of Home: House Home Layout: Two level, Bed/bath upstairs Alternate Level Stairs-Rails: Right Alternate Level Stairs-Number of Steps: 14(but has chair lift) Home Access: Stairs to enter Entrance Stairs-Rails: None Entrance Stairs-Number of Steps: 3-4 Bathroom Shower/Tub: Engineer, manufacturing systems: Standard Home Care Services: No  Discharge Living Setting Plans for Discharge Living Setting: Patient's home, Lives with (comment)(spouse) Type of Home at Discharge: House Discharge Home Layout: Two level, Bed/bath upstairs, 1/2 bath on main level Alternate Level Stairs-Rails: Right(has and used a lift chair ) Alternate Level Stairs-Number of Steps: 14 Discharge Home Access: Stairs to enter Entrance  Stairs-Rails: None Entrance Stairs-Number of Steps: 2 from garage  Discharge Bathroom Shower/Tub: Tub/shower unit Discharge Bathroom Toilet: Standard Discharge Bathroom Accessibility: Yes How Accessible: Accessible via walker Does the patient have any problems obtaining your medications?: No  Social/Family/Support Systems Patient Roles: Spouse Contact Information: Spouse: Transport planner Anticipated Caregiver: Spouse Anticipated Industrial/product designer Information: see above Ability/Limitations of Caregiver: None Caregiver Availability: 24/7 Discharge Plan Discussed with Primary Caregiver: Yes Is Caregiver In Agreement with Plan?: Yes Does Caregiver/Family have Issues with Lodging/Transportation while Pt is in Rehab?: No  Goals/Additional Needs Patient/Family Goal for Rehab: PT/OT: Supervision  Expected length of stay: 6-9 days  Cultural Considerations: [employer]  Dietary Needs: Heart healthy diet restrictions  Equipment Needs: TBD Pt/Family Agrees to Admission and willing to participate: Yes  Decrease burden of Care through IP rehab admission: No  Possible need for SNF placement upon discharge: No  Patient Condition: This patient's medical and functional status has changed since the consult dated 11/04/17 in which the Rehabilitation Physician determined and documented that the patient was potentially appropriate for intensive rehabilitative care in an inpatient rehabilitation facility. Issues have been addressed and update has been discussed with Dr. Allena Katz and patient now appropriate for inpatient rehabilitation. Will admit to inpatient rehab today.   Preadmission Screen Completed By:  Fae Pippin, 11/06/2017 9:27 AM ______________________________________________________________________   Discussed status with Dr. Allena Katz on 11/06/17 at 0930 and received telephone approval for admission today.  Admission Coordinator:  Fae Pippin, time 0930/Date 11/06/17

## 2017-11-05 NOTE — Progress Notes (Signed)
  Echocardiogram Echocardiogram Transesophageal has been performed.  Delcie Roch 11/05/2017, 8:58 AM

## 2017-11-05 NOTE — Progress Notes (Signed)
Inpatient Rehabilitation  This morning I opened case with [facility] Medicare, but awaited updated therapy note.  This afternoon sent updated PT note for review.  Met with patient at bedside to discuss team's recommendation for IP Rehab.  Shared booklets, insurance verification letter, and answered questions.  Plan to follow for timing of medical readiness, insurance authorization, and IP Rehab bed availability.  Call if questions.    Carmelia Roller., CCC/SLP Admission Coordinator  [place]  Cell [phone]

## 2017-11-05 NOTE — Evaluation (Signed)
SLP Cancellation Note  Patient Details Name: Thomas [name]. MRN: [id number] DOB: 10-09-1940   Cancelled treatment:       Reason Eval/Treat Not Completed: Patient at procedure or test/unavailable   Chales Abrahams 11/05/2017, 7:46 AM

## 2017-11-05 NOTE — Progress Notes (Signed)
STROKE TEAM PROGRESS NOTE   SUBJECTIVE (INTERVAL HISTORY) His wife is at the bedside.  Overall he feels his condition is unchanged. 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 LKW was around [employer] basketball game last night around 8:00pm to 8:30pm. After the game, he and his wife noticed he was not able to see on the left side of his visual field.    OBJECTIVE Temp:  [97.4 F (36.3 C)-98.9 F (37.2 C)] 98.3 F (36.8 C) (03/26 0856) Pulse Rate:  [68-120] 103 (03/26 1020) Cardiac Rhythm: Normal sinus rhythm (03/25 1900) Resp:  [13-23] 16 (03/26 1020) BP: (125-179)/(61-87) 169/82 (03/26 1015) SpO2:  [94 %-100 %] 100 % (03/26 1020) Weight:  [166 lb 7.2 oz (75.5 kg)] 166 lb 7.2 oz (75.5 kg) (03/26 0741)  No results for input(s): GLUCAP in the last 168 hours. Recent Labs  Lab 11/03/17 2343 11/04/17 0001 11/05/17 0410  NA 137 139 135  K 4.2 4.1 3.7  CL 106 106 105  CO2 20*  --  21*  GLUCOSE 119* 118* 128*  BUN 30* 32* 13  CREATININE 1.80* 1.80* 0.96  CALCIUM 8.7*  --  8.4*   Recent Labs  Lab 11/03/17 2343  AST 24  ALT 13*  ALKPHOS 96  BILITOT 0.7  PROT 6.5  ALBUMIN 3.6   Recent Labs  Lab 11/03/17 2343 11/04/17 0001 11/05/17 0410  WBC 10.2  --  9.2  NEUTROABS 7.7  --   --   HGB 13.6 13.6 12.3*  HCT 40.5 40.0 36.6*  MCV 92.5  --  90.6  PLT 200  --  183   No results for input(s): CKTOTAL, CKMB, CKMBINDEX, TROPONINI in the last 168 hours. Recent Labs    11/03/17 2343  LABPROT 14.4  INR 1.13   Recent Labs    11/04/17 0107  COLORURINE YELLOW  LABSPEC 1.038*  PHURINE 6.0  GLUCOSEU NEGATIVE  HGBUR SMALL*  BILIRUBINUR NEGATIVE  KETONESUR NEGATIVE  PROTEINUR 30*  NITRITE NEGATIVE  LEUKOCYTESUR LARGE*       Component Value Date/Time   CHOL 138 11/04/2017 0539   TRIG 80 11/04/2017 0539   HDL 42 11/04/2017 0539   CHOLHDL 3.3 11/04/2017 0539   VLDL 16 11/04/2017 0539   LDLCALC 80 11/04/2017 0539   Lab Results  Component Value Date   HGBA1C  5.8 (H) 11/04/2017      Component Value Date/Time   LABOPIA NONE DETECTED 11/04/2017 0107   COCAINSCRNUR NONE DETECTED 11/04/2017 0107   LABBENZ NONE DETECTED 11/04/2017 0107   AMPHETMU NONE DETECTED 11/04/2017 0107   THCU NONE DETECTED 11/04/2017 0107   LABBARB NONE DETECTED 11/04/2017 0107    Recent Labs  Lab 11/03/17 2343  ETH <10    I have personally reviewed the radiological images below and agree with the radiology interpretations.  Ct Angio Head W Or Wo Contrast  Result Date: 11/04/2017 CLINICAL DATA:  77 year old male with left hemianopia. Unknown time of symptom onset. EXAM: CT ANGIOGRAPHY HEAD AND NECK CT PERFUSION BRAIN TECHNIQUE: Multidetector CT imaging of the head and neck was performed using the standard protocol during bolus administration of intravenous contrast. Multiplanar CT image reconstructions and MIPs were obtained to evaluate the vascular anatomy. Carotid stenosis measurements (when applicable) are obtained utilizing NASCET criteria, using the distal internal carotid diameter as the denominator. Multiphase CT imaging of the brain was performed following IV bolus contrast injection. Subsequent parametric perfusion maps were calculated using RAPID software. CONTRAST:  ISOVUE-370 IOPAMIDOL (ISOVUE-370) INJECTION 76%  COMPARISON:  Head CT without contrast 0007 hours today. Brain MRI and MRA 03/19/2013 FINDINGS: CT Brain Perfusion Findings: CBF (<30%) Volume: zero, but erroneous - see next. Perfusion (Tmax>6.0s) volume: 30 mL, which corresponds to the cytotoxic edema seen on the 0007 hours noncontrast head CT today. Therefore, this is infarct core. Mismatch Volume: Probably zero Infarction Location:Right PCA, right occipital lobe CTA NECK Skeleton: Prior sternotomy. No acute osseous abnormality identified. Upper chest: Mild dependent atelectasis. No superior mediastinal lymphadenopathy. Mild gas and fluid distension of the thoracic esophagus. Other neck: Negative.  No  neck mass or lymphadenopathy. Aortic arch: Mild bovine type arch configuration. Calcified arch atherosclerosis. Right carotid system: Tortuous brachiocephalic artery and proximal right CCA with a kinked appearance but no atherosclerotic stenosis. Soft and calcified plaque in the distal right CCA proximal to the bifurcation without stenosis. Bulky calcified plaque at the right carotid bifurcation, but less than 50 % stenosis with respect to the distal vessel at the right ICA origin. Coarse calcified plaque continues into the distal bulb, but stenosis remains less than 50 % with respect to the distal vessel. Patent right ICA to the skull base. Left carotid system: No left CCA origin stenosis. Mildly tortuous proximal left CCA. Occasional plaque proximal to the left carotid bifurcation without stenosis. Bulky calcified plaque at the left carotid bifurcation with short segment radiographic string sign stenosis (series 10, image 112). The left ICA remains patent with no additional stenosis to the skull base despite some calcified plaque. Vertebral arteries: Tortuous proximal right subclavian artery without atherosclerotic stenosis. Right vertebral artery origin calcified plaque with moderate to severe stenosis. The right vertebral artery is non dominant with additional right V2 and V3 segment calcified plaque, but no other stenosis until the skull base. No proximal right subclavian artery stenosis despite plaque. Calcified plaque at the left vertebral artery origin with moderate to severe stenosis. Dominant left vertebral artery with additional calcified plaque in the V2 segments but no other stenosis to the skull base. CTA HEAD Posterior circulation: Calcified plaque in the right vertebral artery V4 segment with moderate to severe stenosis as the vessel crosses the dura and again just proximal to the right PICA origin which remains patent. This appears progressed since 2014. The right vertebral is patent to the  vertebrobasilar junction. The dominant distal left vertebral artery demonstrates calcified plaque with only mild stenosis. Normal left PICA origin. Patent basilar artery with stable irregularity since 2014 mostly in the proximal 3rd. Mild associated stenosis. Patent SCA and PCA origins. Posterior communicating arteries are diminutive or absent. The right P1 and P2 segments are patent with mild irregularity. There is poor flow in the right P3 segments. The left PCA is patent with mild irregularity. Anterior circulation: Both ICA siphons are patent with extensive calcified atherosclerosis. There is moderate to severe short segment supraclinoid right ICA stenosis which may have progressed since 2014 (series 11 image 78). The right ICA terminus remains patent. On the left there is moderate distal petrous segment stenosis due to calcified plaque. 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 There is moderate to severe anterior genu segment stenosis due to calcified plaque, and additional severe left supraclinoid segment stenosis due to calcified plaque. These appear progressed since 2014. The left ICA terminus remains patent. MCA and ACA origins are patent. ACA origins and A1 segments appear normal. Bilateral ACA branches are within normal limits. There is calcified plaque at the left MCA origin but no stenosis. The left M1 segment, left  trifurcation, and left MCA branches are within normal limits. There is calcified plaque in the right MCA origin and M1 segment with only mild stenosis. The right MCA bifurcation and right MCA branches are patent with mild irregularity. No discrete right MCA branch occlusion is identified although there is decreased distal right MCA branch enhancement compared to the left side (series 12, image 13). Venous sinuses: Grossly patent, suboptimal timing for venous valuation. Anatomic variants: Dominant left vertebral artery. Mild bovine type arch configuration.  Review of the MIP images confirms the above findings IMPRESSION: 1. Unreliable CBF results due to cytotoxic edema in the Right PCA territory. 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 This was discussed by telephone with Dr. Ritta Slot on 11/04/2017 at 1229 hours. 2. With regard to #1, there is poor flow in the right P3 divisions, but the right P1 and P2 segments remain patent. 3. Positive for severe atherosclerosis and multifocal other high-grade intra- and extracranial stenosis: - high-grade Left ICA origin radiographic string sign stenosis due to bulky calcified plaque. - moderate to severe bilateral ICA siphon stenosis appears progressed since 2014 and is due to calcified plaque, with multifocal high-grade stenoses on the Left . - moderate to severe stenosis of the non dominant Right Vertebral Artery at both its origin and V4 segments. 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 4.  Aortic Atherosclerosis (ICD10-I70.0). Electronically Signed   By: Odessa Fleming M.D.   On: 11/04/2017 00:52   Ct Angio Neck W Or Wo Contrast  Result Date: 11/04/2017 CLINICAL DATA:  77 year old male with left hemianopia. Unknown time of symptom onset. EXAM: CT ANGIOGRAPHY HEAD AND NECK CT PERFUSION BRAIN TECHNIQUE: Multidetector CT imaging of the head and neck was performed using the standard protocol during bolus administration of intravenous contrast. Multiplanar CT image reconstructions and MIPs were obtained to evaluate the vascular anatomy. Carotid stenosis measurements (when applicable) are obtained utilizing NASCET criteria, using the distal internal carotid diameter as the denominator. Multiphase CT imaging of the brain was performed following IV bolus contrast injection. Subsequent parametric perfusion maps were calculated using RAPID software. CONTRAST:  ISOVUE-370 IOPAMIDOL (ISOVUE-370)  INJECTION 76% COMPARISON:  Head CT without contrast 0007 hours today. Brain MRI and MRA 03/19/2013 FINDINGS: CT Brain Perfusion Findings: CBF (<30%) Volume: zero, but erroneous - see next. Perfusion (Tmax>6.0s) volume: 30 mL, which corresponds to the cytotoxic edema seen on the 0007 hours noncontrast head CT today. Therefore, this is infarct core. Mismatch Volume: Probably zero Infarction Location:Right PCA, right occipital lobe CTA NECK Skeleton: Prior sternotomy. No acute osseous abnormality identified. Upper chest: Mild dependent atelectasis. No superior mediastinal lymphadenopathy. Mild gas and fluid distension of the thoracic esophagus. Other neck: Negative.  No neck mass or lymphadenopathy. Aortic arch: Mild bovine type arch configuration. Calcified arch atherosclerosis. Right carotid system: Tortuous brachiocephalic artery and proximal right CCA with a kinked appearance but no atherosclerotic stenosis. Soft and calcified plaque in the distal right CCA proximal to the bifurcation without stenosis. Bulky calcified plaque at the right carotid bifurcation, but less than 50 % stenosis with respect to the distal vessel at the right ICA origin. Coarse calcified plaque continues into the distal bulb, but stenosis remains less than 50 % with respect to the distal vessel. Patent right ICA to the skull base. Left carotid system: No left CCA origin stenosis. Mildly tortuous proximal left CCA. Occasional  plaque proximal to the left carotid bifurcation without stenosis. Bulky calcified plaque at the left carotid bifurcation with short segment radiographic string sign stenosis (series 10, image 112). The left ICA remains patent with no additional stenosis to the skull base despite some calcified plaque. Vertebral arteries: Tortuous proximal right subclavian artery without atherosclerotic stenosis. Right vertebral artery origin calcified plaque with moderate to severe stenosis. The right vertebral artery is non dominant with  additional right V2 and V3 segment calcified plaque, but no other stenosis until the skull base. No proximal right subclavian artery stenosis despite plaque. Calcified plaque at the left vertebral artery origin with moderate to severe stenosis. Dominant left vertebral artery with additional calcified plaque in the V2 segments but no other stenosis to the skull base. CTA HEAD Posterior circulation: Calcified plaque in the right vertebral artery V4 segment with moderate to severe stenosis as the vessel crosses the dura and again just proximal to the right PICA origin which remains patent. This appears progressed since 2014. The right vertebral is patent to the vertebrobasilar junction. The dominant distal left vertebral artery demonstrates calcified plaque with only mild stenosis. Normal left PICA origin. Patent basilar artery with stable irregularity since 2014 mostly in the proximal 3rd. Mild associated stenosis. Patent SCA and PCA origins. Posterior communicating arteries are diminutive or absent. The right P1 and P2 segments are patent with mild irregularity. There is poor flow in the right P3 segments. The left PCA is patent with mild irregularity. Anterior circulation: Both ICA siphons are patent with extensive calcified atherosclerosis. There is moderate to severe short segment supraclinoid right ICA stenosis which may have progressed since 2014 (series 11 image 78). The right ICA terminus remains patent. On the left there is moderate distal petrous segment stenosis due to calcified plaque. 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 There is moderate to severe anterior genu segment stenosis due to calcified plaque, and additional severe left supraclinoid segment stenosis due to calcified plaque. These appear progressed since 2014. The left ICA terminus remains patent. MCA and ACA origins are patent. ACA origins and A1 segments appear normal. Bilateral ACA branches are within  normal limits. There is calcified plaque at the left MCA origin but no stenosis. The left M1 segment, left trifurcation, and left MCA branches are within normal limits. There is calcified plaque in the right MCA origin and M1 segment with only mild stenosis. The right MCA bifurcation and right MCA branches are patent with mild irregularity. No discrete right MCA branch occlusion is identified although there is decreased distal right MCA branch enhancement compared to the left side (series 12, image 13). Venous sinuses: Grossly patent, suboptimal timing for venous valuation. Anatomic variants: Dominant left vertebral artery. Mild bovine type arch configuration. Review of the MIP images confirms the above findings IMPRESSION: 1. Unreliable CBF results due to cytotoxic edema in the Right PCA territory. 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 This was discussed by telephone with Dr. Ritta Slot on 11/04/2017 at 1229 hours. 2. With regard to #1, there is poor flow in the right P3 divisions, but the right P1 and P2 segments remain patent. 3. Positive for severe atherosclerosis and multifocal other high-grade intra- and extracranial stenosis: - high-grade Left ICA origin radiographic string sign stenosis due to bulky calcified plaque. - moderate to severe bilateral ICA siphon stenosis appears progressed since 2014 and is  due to calcified plaque, with multifocal high-grade stenoses on the Left . - moderate to severe stenosis of the non dominant Right Vertebral Artery at both its origin and V4 segments. 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 4.  Aortic Atherosclerosis (ICD10-I70.0). Electronically Signed   By: Odessa Fleming M.D.   On: 11/04/2017 00:52   Mr Brain Wo Contrast  Result Date: 11/04/2017 CLINICAL DATA:  77 y/o  M; evaluation of stroke. EXAM: MRI HEAD WITHOUT CONTRAST TECHNIQUE:  Multiplanar, multiecho pulse sequences of the brain and surrounding structures were obtained without intravenous contrast. COMPARISON:  10/07/2017 CT head, CTA head, CT perfusion head. 03/19/2013 MRI head. FINDINGS: Brain: Right PCA distribution area of reduced diffusion involving the medial temporal lobe and occipital lobe compatible with acute/early subacute infarction. Few punctate foci of infarction are present in right cerebellum, right splenium of corpus callosum and right parietal lobe. Punctate foci of susceptibility hypointensity within right posteromedial temporal lobe (series 12,001 image 23) compatible with petechial hemorrhage. Extensive confluentnonspecific foci of T2 FLAIR hyperintense signal abnormality in subcortical and periventricular white matter are compatible withseverechronic microvascular ischemic changes for age. Moderatebrain parenchymal volume loss. Small chronic infarcts are present within the bilateral cerebellar hemispheres, pons, left thalamus, and right posterior limb of internal capsule. No extra-axial collection, effacement of basilar cisterns, or significant mass effect. Vascular: Normal central flow voids. Skull and upper cervical spine: Normal marrow signal. Sinuses/Orbits: Moderate diffuse paranasal sinus mucosal thickening with small fluid levels in the maxillary sinus. Trace opacification of left mastoid air cells. Bilateral intra-ocular lens replacement. Other: None. IMPRESSION: 1. Right PCA distribution acute/early subacute infarction involving medial temporal lobe and occipital lobe. Punctate foci of infarction in right cerebellum, splenium of corpus callosum, and parietal lobe. Small area of petechial hemorrhage in right posteromedial temporal lobe. No significant mass effect. 2. Severe chronic microvascular ischemic changes and moderate parenchymal volume loss of the brain. 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 3. Moderate paranasal sinus  disease with fluid levels which may represent acute sinusitis. Electronically Signed   By: Mitzi Hansen M.D.   On: 11/04/2017 15:43   Ct Cerebral Perfusion W Contrast  Result Date: 11/04/2017 CLINICAL DATA:  77 year old male with left hemianopia. Unknown time of symptom onset. EXAM: CT ANGIOGRAPHY HEAD AND NECK CT PERFUSION BRAIN TECHNIQUE: Multidetector CT imaging of the head and neck was performed using the standard protocol during bolus administration of intravenous contrast. Multiplanar CT image reconstructions and MIPs were obtained to evaluate the vascular anatomy. Carotid stenosis measurements (when applicable) are obtained utilizing NASCET criteria, using the distal internal carotid diameter as the denominator. Multiphase CT imaging of the brain was performed following IV bolus contrast injection. Subsequent parametric perfusion maps were calculated using RAPID software. CONTRAST:  ISOVUE-370 IOPAMIDOL (ISOVUE-370) INJECTION 76% COMPARISON:  Head CT without contrast 0007 hours today. Brain MRI and MRA 03/19/2013 FINDINGS: CT Brain Perfusion Findings: CBF (<30%) Volume: zero, but erroneous - see next. Perfusion (Tmax>6.0s) volume: 30 mL, which corresponds to the cytotoxic edema seen on the 0007 hours noncontrast head CT today. Therefore, this is infarct core. Mismatch Volume: Probably zero Infarction Location:Right PCA, right occipital lobe CTA NECK Skeleton: Prior sternotomy. No acute osseous abnormality identified. Upper chest: Mild dependent atelectasis. No superior mediastinal lymphadenopathy. Mild gas and fluid distension of the thoracic esophagus. Other neck: Negative.  No neck mass or lymphadenopathy. Aortic arch: Mild bovine type arch configuration. Calcified arch atherosclerosis. Right carotid system: Tortuous brachiocephalic artery and  proximal right CCA with a kinked appearance but no atherosclerotic stenosis. Soft and calcified plaque in the distal right CCA proximal to the  bifurcation without stenosis. Bulky calcified plaque at the right carotid bifurcation, but less than 50 % stenosis with respect to the distal vessel at the right ICA origin. Coarse calcified plaque continues into the distal bulb, but stenosis remains less than 50 % with respect to the distal vessel. Patent right ICA to the skull base. Left carotid system: No left CCA origin stenosis. Mildly tortuous proximal left CCA. Occasional plaque proximal to the left carotid bifurcation without stenosis. Bulky calcified plaque at the left carotid bifurcation with short segment radiographic string sign stenosis (series 10, image 112). The left ICA remains patent with no additional stenosis to the skull base despite some calcified plaque. Vertebral arteries: Tortuous proximal right subclavian artery without atherosclerotic stenosis. Right vertebral artery origin calcified plaque with moderate to severe stenosis. The right vertebral artery is non dominant with additional right V2 and V3 segment calcified plaque, but no other stenosis until the skull base. No proximal right subclavian artery stenosis despite plaque. Calcified plaque at the left vertebral artery origin with moderate to severe stenosis. Dominant left vertebral artery with additional calcified plaque in the V2 segments but no other stenosis to the skull base. CTA HEAD Posterior circulation: Calcified plaque in the right vertebral artery V4 segment with moderate to severe stenosis as the vessel crosses the dura and again just proximal to the right PICA origin which remains patent. This appears progressed since 2014. The right vertebral is patent to the vertebrobasilar junction. The dominant distal left vertebral artery demonstrates calcified plaque with only mild stenosis. Normal left PICA origin. Patent basilar artery with stable irregularity since 2014 mostly in the proximal 3rd. Mild associated stenosis. Patent SCA and PCA origins. Posterior communicating arteries  are diminutive or absent. The right P1 and P2 segments are patent with mild irregularity. There is poor flow in the right P3 segments. The left PCA is patent with mild irregularity. Anterior circulation: Both ICA siphons are patent with extensive calcified atherosclerosis. There is moderate to severe short segment supraclinoid right ICA stenosis which may have progressed since 2014 (series 11 image 78). The right ICA terminus remains patent. On the left there is moderate distal petrous segment stenosis due to calcified plaque. 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 There is moderate to severe anterior genu segment stenosis due to calcified plaque, and additional severe left supraclinoid segment stenosis due to calcified plaque. These appear progressed since 2014. The left ICA terminus remains patent. MCA and ACA origins are patent. ACA origins and A1 segments appear normal. Bilateral ACA branches are within normal limits. There is calcified plaque at the left MCA origin but no stenosis. The left M1 segment, left trifurcation, and left MCA branches are within normal limits. There is calcified plaque in the right MCA origin and M1 segment with only mild stenosis. The right MCA bifurcation and right MCA branches are patent with mild irregularity. No discrete right MCA branch occlusion is identified although there is decreased distal right MCA branch enhancement compared to the left side (series 12, image 13). Venous sinuses: Grossly patent, suboptimal timing for venous valuation. Anatomic variants: Dominant left vertebral artery. Mild bovine type arch configuration. Review of the MIP images confirms the above findings IMPRESSION: 1. Unreliable CBF results due to cytotoxic edema in the Right PCA territory. The constellation of plain head CT and CTP findings suggest  a completed infarct of 30 mL in the medial right occipital lobe, with no penumbra. This was discussed by telephone  with Dr. Ritta Slot on 11/04/2017 at 1229 hours. 2. With regard to #1, there is poor flow in the right P3 divisions, but the right P1 and P2 segments remain patent. 3. Positive for severe atherosclerosis and multifocal other high-grade intra- and extracranial stenosis: - high-grade Left ICA origin radiographic string sign stenosis due to bulky calcified plaque. - moderate to severe bilateral ICA siphon stenosis appears progressed since 2014 and is due to calcified plaque, with multifocal high-grade stenoses on the Left . - moderate to severe stenosis of the non dominant Right Vertebral Artery at both its origin and V4 segments. 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 4.  Aortic Atherosclerosis (ICD10-I70.0). Electronically Signed   By: Odessa Fleming M.D.   On: 11/04/2017 00:52   Ct Head Code Stroke Wo Contrast  Result Date: 11/04/2017 CLINICAL DATA:  Code stroke. 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 Last known normal 1900 hours. EXAM: CT HEAD WITHOUT CONTRAST TECHNIQUE: Contiguous axial images were obtained from the base of the skull through the vertex without intravenous contrast. COMPARISON:  Brain MRI, intracranial MRA, and noncontrast head CT 03/19/2013 FINDINGS: Brain: Similar cerebral volume since 2014. 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 No cortical encephalomalacia identified, but there is abnormal hypodensity in the medial right occipital lobe on series 3, image 15 and coronal image 47. No associated hemorrhage or mass effect. No other cytotoxic edema identified. No midline shift, ventriculomegaly, mass effect, evidence of mass lesion, or acute intracranial hemorrhage. Vascular: Extensive calcified atherosclerosis at the skull base. Questionable abnormal hyperdensity of the right PCA on series 5, image 42. Skull: No acute osseous abnormality identified. Sinuses/Orbits: Widespread paranasal sinus  mucosal thickening and fluid levels are largely new since 2014. The bilateral tympanic cavities and mastoids remain clear. Other: No acute orbit or scalp soft tissue findings. Orbits soft tissues appears stable since 2014. ASPECTS [facility] Stroke Program Early CT Score) - Ganglionic level infarction (caudate, lentiform nuclei, internal capsule, insula, M1-M3 cortex): 7 - Supraganglionic infarction (M4-M6 cortex): 3 Total score (0-10 with 10 being normal): 10 IMPRESSION: 1. 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 2. Suspicion of hyperdense right PCA indicating occlusion. 3. Underlying advanced chronic small vessel disease. 4. These results were communicated to Dr. Amada Jupiter at 12:22 amon 3/25/2019by text page via the [facility] messaging system. Electronically Signed   By: Odessa Fleming M.D.   On: 11/04/2017 00:23   MRI pending  LE venous doppler pending  TEE pending  TTE 10/14/17 - Left ventricle: The cavity size was normal. Wall thickness was   increased in a pattern of severe LVH. Systolic function was   normal. The estimated ejection fraction was in the range of 55%   to 60%. - Aortic valve: There was mild stenosis. There was mild   regurgitation. Valve area (VTI): 2.23 cm^2. Valve area (Vmax):   1.55 cm^2. Valve area (Vmean): 1.53 cm^2. - Left atrium: The atrium was moderately dilated. - Atrial septum: No defect or patent foramen ovale was identified.   PHYSICAL EXAM  Temp:  [97.4 F (36.3 C)-98.9 F (37.2 C)] 98.3 F (36.8 C) (03/26 0856) Pulse Rate:  [68-120] 103 (03/26 1020) Resp:  [13-23] 16 (03/26 1020) BP: (125-179)/(61-87) 169/82 (03/26 1015) SpO2:  [94 %-100 %] 100 % (  03/26 1020) Weight:  [166 lb 7.2 oz (75.5 kg)] 166 lb 7.2 oz (75.5 kg) (03/26 0741)  General - Well nourished, well developed, in no apparent distress.  Ophthalmologic - fundi not visualized due to noncooperation.  Cardiovascular - Regular  rate and rhythm.  Mental Status -  Level of arousal and orientation to time, place, and person were intact. Language including expression, naming, repetition, comprehension was assessed and found intact. Fund of Knowledge was assessed and was impaired with previous presidents  Cranial Nerves II - XII - II - left dense hemianopia. III, IV, VI - Extraocular movements intact. V - Facial sensation intact bilaterally. VII - Facial movement intact bilaterally. Left eye closure not as strong as right VIII - Hearing & vestibular intact bilaterally. X - Palate elevates symmetrically. XI - Chin turning & shoulder shrug intact bilaterally. XII - Tongue protrusion intact.  Motor Strength - The patient's strength was normal in all extremities and pronator drift was absent.  Bulk was normal and fasciculations were absent.   Motor Tone - Muscle tone was assessed at the neck and appendages and was normal.  Reflexes - The patient's reflexes were symmetrical in all extremities and he had no pathological reflexes.  Sensory - Light touch, temperature/pinprick were assessed and were symmetrical.    Coordination - The patient had normal movements in the hands with no ataxia or dysmetria.  Tremor was absent.  Gait and Station - deferred.   ASSESSMENT/PLAN Mr. Demiko Cadiente. is a 77 y.o. male with history of CHF, CKD, CAD s/p CABG 1999, HLD, HTN, TIA in 2003 and stroke in 2014 admitted for confusion and left visual field cut. No tPA given due to clear hypodensity on CT.    Stroke:  right PCA large, right cerebellar and right MCA punctate infarcts, embolic secondary to unclear source  Resultant left dense hemianopia  MRI right PCA large, right cerebellar and right MCA punctate infarcts, remote lacunar infarcts in cerebellum, basal ganglia, and brainstem.  CTA head and neck - right P3 flow decreased, left ICA proximal string sign at bifurcation, b/l ICA siphon progressive atherosclerosis with  stenosis, right VA V1 and V4 stenosis.   2D Echo  EF 55-60% on 10/14/17  TEE unremarkable  Loop recorder placed  LDL 80  HgbA1c 5.8  lovenox for VTE prophylaxis Fall precautions  Diet Heart Room service appropriate? Yes; Fluid consistency: Thin   clopidogrel 75 mg daily prior to admission, now on aspirin 325 mg daily and clopidogrel 75 mg daily. Continue DAPT for 3 months and then plavix alone given hx of stroke and significant extra- and intracranial stenosis.   Patient counseled to be compliant with his antithrombotic medications  Ongoing aggressive stroke risk factor management  Therapy recommendations:  CIR vs. HH  Disposition:  pending  Hx of stroke/TIA  TIA 2003  Stroke 03/2013 with left lower extremity weakness, MRI showed right CR, left frontal cortex, left occipital white matter infarcts, LDL 55, A1c 5.9, carotid Doppler negative, MRI showed right VA and bilateral ICA siphon stenosis.  Aspirin 325 changed to aspirin 325 and Plavix 75 dual antiplatelet.  Followed with Dr. Pearlean Brownie in clinic and on plavix PTA  Carotid stenosis and intracranial stenosis  Left ICA bifurcation high-grade stenosis with string sign on CTA  Need close vascular surgery outpatient follow-up for asymptomatic left ICA high-grade stenosis   Bilateral ICA siphon progressive atherosclerosis and stenosis  Put on duel antiplatelet and high-dose Lipitor  Hypertension Stable Permissive hypertension (OK if <  220/120) for 24-48 hours post stroke and then gradually normalized within 5-7 days.  Long term BP goal 130-150 due to left ICA stenosis  Hyperlipidemia  Home meds: Lipitor 40  LDL 80, goal < 70  Now on Lipitor 80  Continue statin at discharge  Other Stroke Risk Factors  Advanced age  Coronary artery disease s/p CABG in 1999  CHF - following with Dr. Eden Emms  Other Active Problems  Left hemianopia, patient currently not driving as per wife  CKD stage III. Cre  1.80->0.96  Hospital day # 1  Neurology will sign off. Please call with questions. Pt will follow up with Dr. Pearlean Brownie at [facility] in about 4 weeks. Thanks for the consult.   Marvel Plan, MD PhD Stroke Neurology 11/05/2017 11:00 AM    To contact Stroke Continuity provider, please refer to [email or web address]. After hours, contact General Neurology

## 2017-11-05 NOTE — Consult Note (Signed)
ELECTROPHYSIOLOGY CONSULT NOTE  Patient ID: Thomas [name]. MRN: [id number], DOB/AGE: 77-Oct-1942   Admit date: 11/03/2017 Date of Consult: 11/05/2017  Primary Physician: Shirline Frees, NP Primary Cardiologist: Dr Eden Emms Reason for Consultation: Cryptogenic stroke; recommendations regarding Implantable Loop Recorder (requested by Dr Roda Shutters)  History of Present Illness Thomas [name]. was admitted on 11/03/2017 with acute CVA. he has been monitored on telemetry which has demonstrated no arrhythmias. No cause has been identified. Inpatient stroke work-up is to be completed with a TEE.  He has had prior TIA in 2014 also.   EP has been asked to evaluate for placement of an implantable loop recorder to monitor for atrial fibrillation.  Past Medical History Past Medical History:  Diagnosis Date  . CHF (congestive heart failure) (HCC) 1999  . CKD (chronic kidney disease), stage II   . Coronary artery disease   . Hx of colonic polyps   . Hyperlipidemia   . Hyperplastic colon polyp 04/15/2007  . Hypertension   . Prostatic hypertrophy, benign    with elevated PSA  . TIA (transient ischemic attack) 2003   "mini stroke" (03/19/2013)    Past Surgical History Past Surgical History:  Procedure Laterality Date  . CARDIAC CATHETERIZATION    . CATARACT EXTRACTION W/ INTRAOCULAR LENS  IMPLANT, BILATERAL Bilateral 1998  . COLONOSCOPY  2008  . CORONARY ARTERY BYPASS GRAFT  1999   x4    Allergies/Intolerances Allergies  Allergen Reactions  . Sulfonamide Derivatives     REACTION: Weak,lethargic   Inpatient Medications . aspirin EC  325 mg Oral Daily  . atorvastatin  80 mg Oral Daily  . clopidogrel  75 mg Oral Q breakfast  . enoxaparin (LOVENOX) injection  40 mg Subcutaneous Q24H  . folic acid  1 mg Oral Daily  . loratadine  10 mg Oral Daily  . omega-3 acid ethyl esters  1 g Oral Daily   . sodium chloride    . sodium chloride 100 mL/hr at 11/04/17 1932  . cefTRIAXone (ROCEPHIN)  IV  Stopped (11/05/17 4098)   Social History Social History   Socioeconomic History  . Marital status: Married    Spouse name: cathy  . Number of children: 2  . Years of education: college  . Highest education level: Not on file  Occupational History  . Occupation: retired  Engineer, production  . Financial resource strain: Not on file  . Food insecurity:    Worry: Not on file    Inability: Not on file  . Transportation needs:    Medical: Not on file    Non-medical: Not on file  Tobacco Use  . Smoking status: Former Smoker    Packs/day: 0.75    Years: 50.00    Pack years: 37.50    Types: Cigarettes    Last attempt to quit: 03/19/2013    Years since quitting: 4.6  . Smokeless tobacco: Never Used  Substance and Sexual Activity  . Alcohol use: Yes    Alcohol/week: 0.6 oz    Types: 1 Cans of beer per week    Comment: 03/19/2013 "1 beer/week" does not drink anymore  . Drug use: No  . Sexual activity: Not Currently  Lifestyle  . Physical activity:    Days per week: Not on file    Minutes per session: Not on file  . Stress: Not on file  Relationships  . Social connections:    Talks on phone: Not on file    Gets together: Not on  file    Attends religious service: Not on file    Active member of club or organization: Not on file    Attends meetings of clubs or organizations: Not on file    Relationship status: Not on file  . Intimate partner violence:    Fear of current or ex partner: Not on file    Emotionally abused: Not on file    Physically abused: Not on file    Forced sexual activity: Not on file  Other Topics Concern  . Not on file  Social History Narrative   Patient lives at home with wife [name])   Retired.   Education one year of college.   Right handed.   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     Review of Systems General: No chills, fever, night sweats or weight changes  Cardiovascular:  No chest pain, dyspnea on exertion, edema, orthopnea, palpitations, paroxysmal  nocturnal dyspnea Dermatological: No rash, lesions or masses Respiratory: No cough, dyspnea Urologic: No hematuria, dysuria Abdominal: No nausea, vomiting, diarrhea, bright red blood per rectum, melena, or hematemesis Neurologic: No visual changes, weakness, changes in mental status All other systems reviewed and are otherwise negative except as noted above.  Physical Exam Blood pressure (!) 165/78, pulse 98, temperature 98.3 F (36.8 C), temperature source Oral, resp. rate 16, height 5\' 11"  (1.803 m), weight 166 lb 7.2 oz (75.5 kg), SpO2 97 %.  General: Well developed, well appearing 77 y.o. male in no acute distress. HEENT: Normocephalic, atraumatic. EOMs intact. Sclera nonicteric. Oropharynx clear.  Neck: Supple without bruits. No JVD. Lungs: Respirations regular and unlabored, CTA bilaterally. No wheezes, rales or rhonchi. Heart: RRR. S1, S2 present. No murmurs, rub, S3 or S4. Abdomen: Soft, non-tender, non-distended. BS present x 4 quadrants. No hepatosplenomegaly.  Extremities: No clubbing, cyanosis or edema. DP/PT/Radials 2+ and equal bilaterally. Psych: Normal affect. Musculoskeletal: No kyphosis. Skin: Intact. Warm and dry. No rashes or petechiae in exposed areas.   Labs Lab Results  Component Value Date   WBC 9.2 11/05/2017   HGB 12.3 (L) 11/05/2017   HCT 36.6 (L) 11/05/2017   MCV 90.6 11/05/2017   PLT 183 11/05/2017    Recent Labs  Lab 11/03/17 2343  11/05/17 0410  NA 137   < > 135  K 4.2   < > 3.7  CL 106   < > 105  CO2 20*  --  21*  BUN 30*   < > 13  CREATININE 1.80*   < > 0.96  CALCIUM 8.7*  --  8.4*  PROT 6.5  --   --   BILITOT 0.7  --   --   ALKPHOS 96  --   --   ALT 13*  --   --   AST 24  --   --   GLUCOSE 119*   < > 128*   < > = values in this interval not displayed.   Recent Labs    11/03/17 2343  INR 1.13    Radiology/Studies Ct Angio Head W Or Wo Contrast  Result Date: 11/04/2017 CLINICAL DATA:  77 year old male with left hemianopia.  Unknown time of symptom onset. EXAM: CT ANGIOGRAPHY HEAD AND NECK CT PERFUSION BRAIN TECHNIQUE: Multidetector CT imaging of the head and neck was performed using the standard protocol during bolus administration of intravenous contrast. Multiplanar CT image reconstructions and MIPs were obtained to evaluate the vascular anatomy. Carotid stenosis measurements (when applicable) are obtained utilizing NASCET criteria, using the distal internal carotid  diameter as the denominator. Multiphase CT imaging of the brain was performed following IV bolus contrast injection. Subsequent parametric perfusion maps were calculated using RAPID software. CONTRAST:  ISOVUE-370 IOPAMIDOL (ISOVUE-370) INJECTION 76% COMPARISON:  Head CT without contrast 0007 hours today. Brain MRI and MRA 03/19/2013 FINDINGS: CT Brain Perfusion Findings: CBF (<30%) Volume: zero, but erroneous - see next. Perfusion (Tmax>6.0s) volume: 30 mL, which corresponds to the cytotoxic edema seen on the 0007 hours noncontrast head CT today. Therefore, this is infarct core. Mismatch Volume: Probably zero Infarction Location:Right PCA, right occipital lobe CTA NECK Skeleton: Prior sternotomy. No acute osseous abnormality identified. Upper chest: Mild dependent atelectasis. No superior mediastinal lymphadenopathy. Mild gas and fluid distension of the thoracic esophagus. Other neck: Negative.  No neck mass or lymphadenopathy. Aortic arch: Mild bovine type arch configuration. Calcified arch atherosclerosis. Right carotid system: Tortuous brachiocephalic artery and proximal right CCA with a kinked appearance but no atherosclerotic stenosis. Soft and calcified plaque in the distal right CCA proximal to the bifurcation without stenosis. Bulky calcified plaque at the right carotid bifurcation, but less than 50 % stenosis with respect to the distal vessel at the right ICA origin. Coarse calcified plaque continues into the distal bulb, but stenosis remains less than 50  % with respect to the distal vessel. Patent right ICA to the skull base. Left carotid system: No left CCA origin stenosis. Mildly tortuous proximal left CCA. Occasional plaque proximal to the left carotid bifurcation without stenosis. Bulky calcified plaque at the left carotid bifurcation with short segment radiographic string sign stenosis (series 10, image 112). The left ICA remains patent with no additional stenosis to the skull base despite some calcified plaque. Vertebral arteries: Tortuous proximal right subclavian artery without atherosclerotic stenosis. Right vertebral artery origin calcified plaque with moderate to severe stenosis. The right vertebral artery is non dominant with additional right V2 and V3 segment calcified plaque, but no other stenosis until the skull base. No proximal right subclavian artery stenosis despite plaque. Calcified plaque at the left vertebral artery origin with moderate to severe stenosis. Dominant left vertebral artery with additional calcified plaque in the V2 segments but no other stenosis to the skull base. CTA HEAD Posterior circulation: Calcified plaque in the right vertebral artery V4 segment with moderate to severe stenosis as the vessel crosses the dura and again just proximal to the right PICA origin which remains patent. This appears progressed since 2014. The right vertebral is patent to the vertebrobasilar junction. The dominant distal left vertebral artery demonstrates calcified plaque with only mild stenosis. Normal left PICA origin. Patent basilar artery with stable irregularity since 2014 mostly in the proximal 3rd. Mild associated stenosis. Patent SCA and PCA origins. Posterior communicating arteries are diminutive or absent. The right P1 and P2 segments are patent with mild irregularity. There is poor flow in the right P3 segments. The left PCA is patent with mild irregularity. Anterior circulation: Both ICA siphons are patent with extensive calcified  atherosclerosis. There is moderate to severe short segment supraclinoid right ICA stenosis which may have progressed since 2014 (series 11 image 78). The right ICA terminus remains patent. On the left there is moderate distal petrous segment stenosis due to calcified plaque. 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 There is moderate to severe anterior genu segment stenosis due to calcified plaque, and additional severe left supraclinoid segment stenosis due to calcified plaque. These appear progressed since 2014. The left ICA terminus remains patent. MCA and ACA  origins are patent. ACA origins and A1 segments appear normal. Bilateral ACA branches are within normal limits. There is calcified plaque at the left MCA origin but no stenosis. The left M1 segment, left trifurcation, and left MCA branches are within normal limits. There is calcified plaque in the right MCA origin and M1 segment with only mild stenosis. The right MCA bifurcation and right MCA branches are patent with mild irregularity. No discrete right MCA branch occlusion is identified although there is decreased distal right MCA branch enhancement compared to the left side (series 12, image 13). Venous sinuses: Grossly patent, suboptimal timing for venous valuation. Anatomic variants: Dominant left vertebral artery. Mild bovine type arch configuration. Review of the MIP images confirms the above findings IMPRESSION: 1. Unreliable CBF results due to cytotoxic edema in the Right PCA territory. 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 This was discussed by telephone with Dr. Ritta Slot on 11/04/2017 at 1229 hours. 2. With regard to #1, there is poor flow in the right P3 divisions, but the right P1 and P2 segments remain patent. 3. Positive for severe atherosclerosis and multifocal other high-grade intra- and  extracranial stenosis: - high-grade Left ICA origin radiographic string sign stenosis due to bulky calcified plaque. - moderate to severe bilateral ICA siphon stenosis appears progressed since 2014 and is due to calcified plaque, with multifocal high-grade stenoses on the Left . - moderate to severe stenosis of the non dominant Right Vertebral Artery at both its origin and V4 segments. 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 4.  Aortic Atherosclerosis (ICD10-I70.0). Electronically Signed   By: Odessa Fleming M.D.   On: 11/04/2017 00:52   Ct Angio Neck W Or Wo Contrast  Result Date: 11/04/2017 CLINICAL DATA:  77 year old male with left hemianopia. Unknown time of symptom onset. EXAM: CT ANGIOGRAPHY HEAD AND NECK CT PERFUSION BRAIN TECHNIQUE: Multidetector CT imaging of the head and neck was performed using the standard protocol during bolus administration of intravenous contrast. Multiplanar CT image reconstructions and MIPs were obtained to evaluate the vascular anatomy. Carotid stenosis measurements (when applicable) are obtained utilizing NASCET criteria, using the distal internal carotid diameter as the denominator. Multiphase CT imaging of the brain was performed following IV bolus contrast injection. Subsequent parametric perfusion maps were calculated using RAPID software. CONTRAST:  ISOVUE-370 IOPAMIDOL (ISOVUE-370) INJECTION 76% COMPARISON:  Head CT without contrast 0007 hours today. Brain MRI and MRA 03/19/2013 FINDINGS: CT Brain Perfusion Findings: CBF (<30%) Volume: zero, but erroneous - see next. Perfusion (Tmax>6.0s) volume: 30 mL, which corresponds to the cytotoxic edema seen on the 0007 hours noncontrast head CT today. Therefore, this is infarct core. Mismatch Volume: Probably zero Infarction Location:Right PCA, right occipital lobe CTA NECK Skeleton: Prior sternotomy. No acute osseous abnormality identified. Upper chest: Mild dependent atelectasis. No superior  mediastinal lymphadenopathy. Mild gas and fluid distension of the thoracic esophagus. Other neck: Negative.  No neck mass or lymphadenopathy. Aortic arch: Mild bovine type arch configuration. Calcified arch atherosclerosis. Right carotid system: Tortuous brachiocephalic artery and proximal right CCA with a kinked appearance but no atherosclerotic stenosis. Soft and calcified plaque in the distal right CCA proximal to the bifurcation without stenosis. Bulky calcified plaque at the right carotid bifurcation, but less than 50 % stenosis with respect to the distal vessel at the right ICA origin. Coarse calcified plaque continues into the distal bulb,  but stenosis remains less than 50 % with respect to the distal vessel. Patent right ICA to the skull base. Left carotid system: No left CCA origin stenosis. Mildly tortuous proximal left CCA. Occasional plaque proximal to the left carotid bifurcation without stenosis. Bulky calcified plaque at the left carotid bifurcation with short segment radiographic string sign stenosis (series 10, image 112). The left ICA remains patent with no additional stenosis to the skull base despite some calcified plaque. Vertebral arteries: Tortuous proximal right subclavian artery without atherosclerotic stenosis. Right vertebral artery origin calcified plaque with moderate to severe stenosis. The right vertebral artery is non dominant with additional right V2 and V3 segment calcified plaque, but no other stenosis until the skull base. No proximal right subclavian artery stenosis despite plaque. Calcified plaque at the left vertebral artery origin with moderate to severe stenosis. Dominant left vertebral artery with additional calcified plaque in the V2 segments but no other stenosis to the skull base. CTA HEAD Posterior circulation: Calcified plaque in the right vertebral artery V4 segment with moderate to severe stenosis as the vessel crosses the dura and again just proximal to the right  PICA origin which remains patent. This appears progressed since 2014. The right vertebral is patent to the vertebrobasilar junction. The dominant distal left vertebral artery demonstrates calcified plaque with only mild stenosis. Normal left PICA origin. Patent basilar artery with stable irregularity since 2014 mostly in the proximal 3rd. Mild associated stenosis. Patent SCA and PCA origins. Posterior communicating arteries are diminutive or absent. The right P1 and P2 segments are patent with mild irregularity. There is poor flow in the right P3 segments. The left PCA is patent with mild irregularity. Anterior circulation: Both ICA siphons are patent with extensive calcified atherosclerosis. There is moderate to severe short segment supraclinoid right ICA stenosis which may have progressed since 2014 (series 11 image 78). The right ICA terminus remains patent. On the left there is moderate distal petrous segment stenosis due to calcified plaque. 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 There is moderate to severe anterior genu segment stenosis due to calcified plaque, and additional severe left supraclinoid segment stenosis due to calcified plaque. These appear progressed since 2014. The left ICA terminus remains patent. MCA and ACA origins are patent. ACA origins and A1 segments appear normal. Bilateral ACA branches are within normal limits. There is calcified plaque at the left MCA origin but no stenosis. The left M1 segment, left trifurcation, and left MCA branches are within normal limits. There is calcified plaque in the right MCA origin and M1 segment with only mild stenosis. The right MCA bifurcation and right MCA branches are patent with mild irregularity. No discrete right MCA branch occlusion is identified although there is decreased distal right MCA branch enhancement compared to the left side (series 12, image 13). Venous sinuses: Grossly patent, suboptimal timing  for venous valuation. Anatomic variants: Dominant left vertebral artery. Mild bovine type arch configuration. Review of the MIP images confirms the above findings IMPRESSION: 1. Unreliable CBF results due to cytotoxic edema in the Right PCA territory. 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 This was discussed by telephone with Dr. Ritta Slot on 11/04/2017 at 1229 hours. 2. With regard to #1, there is poor flow in the right P3 divisions, but the right P1 and P2 segments remain patent. 3. Positive for severe atherosclerosis and multifocal  other high-grade intra- and extracranial stenosis: - high-grade Left ICA origin radiographic string sign stenosis due to bulky calcified plaque. - moderate to severe bilateral ICA siphon stenosis appears progressed since 2014 and is due to calcified plaque, with multifocal high-grade stenoses on the Left . - moderate to severe stenosis of the non dominant Right Vertebral Artery at both its origin and V4 segments. 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 4.  Aortic Atherosclerosis (ICD10-I70.0). Electronically Signed   By: Odessa Fleming M.D.   On: 11/04/2017 00:52   Mr Brain Wo Contrast  Result Date: 11/04/2017 CLINICAL DATA:  77 y/o  M; evaluation of stroke. EXAM: MRI HEAD WITHOUT CONTRAST TECHNIQUE: Multiplanar, multiecho pulse sequences of the brain and surrounding structures were obtained without intravenous contrast. COMPARISON:  10/07/2017 CT head, CTA head, CT perfusion head. 03/19/2013 MRI head. FINDINGS: Brain: Right PCA distribution area of reduced diffusion involving the medial temporal lobe and occipital lobe compatible with acute/early subacute infarction. Few punctate foci of infarction are present in right cerebellum, right splenium of corpus callosum and right parietal lobe. Punctate foci of susceptibility hypointensity within right  posteromedial temporal lobe (series 12,001 image 23) compatible with petechial hemorrhage. Extensive confluentnonspecific foci of T2 FLAIR hyperintense signal abnormality in subcortical and periventricular white matter are compatible withseverechronic microvascular ischemic changes for age. Moderatebrain parenchymal volume loss. Small chronic infarcts are present within the bilateral cerebellar hemispheres, pons, left thalamus, and right posterior limb of internal capsule. No extra-axial collection, effacement of basilar cisterns, or significant mass effect. Vascular: Normal central flow voids. Skull and upper cervical spine: Normal marrow signal. Sinuses/Orbits: Moderate diffuse paranasal sinus mucosal thickening with small fluid levels in the maxillary sinus. Trace opacification of left mastoid air cells. Bilateral intra-ocular lens replacement. Other: None. IMPRESSION: 1. Right PCA distribution acute/early subacute infarction involving medial temporal lobe and occipital lobe. Punctate foci of infarction in right cerebellum, splenium of corpus callosum, and parietal lobe. Small area of petechial hemorrhage in right posteromedial temporal lobe. No significant mass effect. 2. Severe chronic microvascular ischemic changes and moderate parenchymal volume loss of the brain. 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 3. Moderate paranasal sinus disease with fluid levels which may represent acute sinusitis. Electronically Signed   By: Mitzi Hansen M.D.   On: 11/04/2017 15:43   Ct Cerebral Perfusion W Contrast  Result Date: 11/04/2017 CLINICAL DATA:  77 year old male with left hemianopia. Unknown time of symptom onset. EXAM: CT ANGIOGRAPHY HEAD AND NECK CT PERFUSION BRAIN TECHNIQUE: Multidetector CT imaging of the head and neck was performed using the standard protocol during bolus administration of intravenous contrast. Multiplanar CT image reconstructions and MIPs were obtained  to evaluate the vascular anatomy. Carotid stenosis measurements (when applicable) are obtained utilizing NASCET criteria, using the distal internal carotid diameter as the denominator. Multiphase CT imaging of the brain was performed following IV bolus contrast injection. Subsequent parametric perfusion maps were calculated using RAPID software. CONTRAST:  ISOVUE-370 IOPAMIDOL (ISOVUE-370) INJECTION 76% COMPARISON:  Head CT without contrast 0007 hours today. Brain MRI and MRA 03/19/2013 FINDINGS: CT Brain Perfusion Findings: CBF (<30%) Volume: zero, but erroneous - see next. Perfusion (Tmax>6.0s) volume: 30 mL, which corresponds to the cytotoxic edema seen on the 0007 hours noncontrast head CT today. Therefore, this is infarct core. Mismatch Volume: Probably zero Infarction Location:Right PCA, right occipital lobe CTA NECK Skeleton: Prior sternotomy. No acute osseous abnormality identified. Upper chest: Mild dependent atelectasis. No superior mediastinal lymphadenopathy. Mild  gas and fluid distension of the thoracic esophagus. Other neck: Negative.  No neck mass or lymphadenopathy. Aortic arch: Mild bovine type arch configuration. Calcified arch atherosclerosis. Right carotid system: Tortuous brachiocephalic artery and proximal right CCA with a kinked appearance but no atherosclerotic stenosis. Soft and calcified plaque in the distal right CCA proximal to the bifurcation without stenosis. Bulky calcified plaque at the right carotid bifurcation, but less than 50 % stenosis with respect to the distal vessel at the right ICA origin. Coarse calcified plaque continues into the distal bulb, but stenosis remains less than 50 % with respect to the distal vessel. Patent right ICA to the skull base. Left carotid system: No left CCA origin stenosis. Mildly tortuous proximal left CCA. Occasional plaque proximal to the left carotid bifurcation without stenosis. Bulky calcified plaque at the left carotid bifurcation with  short segment radiographic string sign stenosis (series 10, image 112). The left ICA remains patent with no additional stenosis to the skull base despite some calcified plaque. Vertebral arteries: Tortuous proximal right subclavian artery without atherosclerotic stenosis. Right vertebral artery origin calcified plaque with moderate to severe stenosis. The right vertebral artery is non dominant with additional right V2 and V3 segment calcified plaque, but no other stenosis until the skull base. No proximal right subclavian artery stenosis despite plaque. Calcified plaque at the left vertebral artery origin with moderate to severe stenosis. Dominant left vertebral artery with additional calcified plaque in the V2 segments but no other stenosis to the skull base. CTA HEAD Posterior circulation: Calcified plaque in the right vertebral artery V4 segment with moderate to severe stenosis as the vessel crosses the dura and again just proximal to the right PICA origin which remains patent. This appears progressed since 2014. The right vertebral is patent to the vertebrobasilar junction. The dominant distal left vertebral artery demonstrates calcified plaque with only mild stenosis. Normal left PICA origin. Patent basilar artery with stable irregularity since 2014 mostly in the proximal 3rd. Mild associated stenosis. Patent SCA and PCA origins. Posterior communicating arteries are diminutive or absent. The right P1 and P2 segments are patent with mild irregularity. There is poor flow in the right P3 segments. The left PCA is patent with mild irregularity. Anterior circulation: Both ICA siphons are patent with extensive calcified atherosclerosis. There is moderate to severe short segment supraclinoid right ICA stenosis which may have progressed since 2014 (series 11 image 78). The right ICA terminus remains patent. On the left there is moderate distal petrous segment stenosis due to calcified plaque. 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 There is moderate to severe anterior genu segment stenosis due to calcified plaque, and additional severe left supraclinoid segment stenosis due to calcified plaque. These appear progressed since 2014. The left ICA terminus remains patent. MCA and ACA origins are patent. ACA origins and A1 segments appear normal. Bilateral ACA branches are within normal limits. There is calcified plaque at the left MCA origin but no stenosis. The left M1 segment, left trifurcation, and left MCA branches are within normal limits. There is calcified plaque in the right MCA origin and M1 segment with only mild stenosis. The right MCA bifurcation and right MCA branches are patent with mild irregularity. No discrete right MCA branch occlusion is identified although there is decreased distal right MCA branch enhancement compared to the left side (series 12, image 13). Venous sinuses: Grossly patent, suboptimal timing for venous valuation. Anatomic variants: Dominant left vertebral artery. Mild bovine type arch  configuration. Review of the MIP images confirms the above findings IMPRESSION: 1. Unreliable CBF results due to cytotoxic edema in the Right PCA territory. 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 This was discussed by telephone with Dr. Ritta Slot on 11/04/2017 at 1229 hours. 2. With regard to #1, there is poor flow in the right P3 divisions, but the right P1 and P2 segments remain patent. 3. Positive for severe atherosclerosis and multifocal other high-grade intra- and extracranial stenosis: - high-grade Left ICA origin radiographic string sign stenosis due to bulky calcified plaque. - moderate to severe bilateral ICA siphon stenosis appears progressed since 2014 and is due to calcified plaque, with multifocal high-grade stenoses on the Left . - moderate to severe  stenosis of the non dominant Right Vertebral Artery at both its origin and V4 segments. 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 4.  Aortic Atherosclerosis (ICD10-I70.0). Electronically Signed   By: Odessa Fleming M.D.   On: 11/04/2017 00:52   Ct Head Code Stroke Wo Contrast  Result Date: 11/04/2017 CLINICAL DATA:  Code stroke. 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 Last known normal 1900 hours. EXAM: CT HEAD WITHOUT CONTRAST TECHNIQUE: Contiguous axial images were obtained from the base of the skull through the vertex without intravenous contrast. COMPARISON:  Brain MRI, intracranial MRA, and noncontrast head CT 03/19/2013 FINDINGS: Brain: Similar cerebral volume since 2014. 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 No cortical encephalomalacia identified, but there is abnormal hypodensity in the medial right occipital lobe on series 3, image 15 and coronal image 47. No associated hemorrhage or mass effect. No other cytotoxic edema identified. No midline shift, ventriculomegaly, mass effect, evidence of mass lesion, or acute intracranial hemorrhage. Vascular: Extensive calcified atherosclerosis at the skull base. Questionable abnormal hyperdensity of the right PCA on series 5, image 42. Skull: No acute osseous abnormality identified. Sinuses/Orbits: Widespread paranasal sinus mucosal thickening and fluid levels are largely new since 2014. The bilateral tympanic cavities and mastoids remain clear. Other: No acute orbit or scalp soft tissue findings. Orbits soft tissues appears stable since 2014. ASPECTS [facility] Stroke Program Early CT Score) - Ganglionic level infarction (caudate, lentiform nuclei, internal capsule, insula, M1-M3 cortex): 7 - Supraganglionic infarction (M4-M6 cortex): 3 Total score (0-10 with 10 being normal): 10 IMPRESSION: 1. Positive for acute to subacute right PCA territory infarct with cytotoxic edema  but no hemorrhage or mass effect. 2. Suspicion of hyperdense right PCA indicating occlusion. 3. Underlying advanced chronic small vessel disease. 4. These results were communicated to Dr. Amada Jupiter at 12:22 amon 3/25/2019by text page via the [facility] messaging system. Electronically Signed   By: Odessa Fleming M.D.   On: 11/04/2017 00:23    Echocardiogram  10/14/17 reveals EF 55%, mild AI, moderate LA enlargement  12-lead ECG sinus Telemetry sinus with PVCs, no afib   Assessment and Plan 1. Cryptogenic stroke The patient presents with cryptogenic stroke.  He is making good recovery.  If the TEE is negative, I recommend loop recorder insertion to monitor for AF. The indication for loop recorder insertion / monitoring for AF in setting of cryptogenic stroke was discussed with the patient. The loop recorder insertion procedure was reviewed in detail including risks and benefits. These risks include but are not limited to bleeding and infection. The patient and his wife expressed verbal understanding and agrees to  proceed. The patient was also counseled regarding wound care and device follow-up.  Randolm Idol MD 11/05/2017, 7:30 AM

## 2017-11-05 NOTE — Interval H&P Note (Signed)
History and Physical Interval Note:  11/05/2017 8:26 AM  Thomas [name].  has presented today for surgery, with the diagnosis of STROKE  The various methods of treatment have been discussed with the patient and family. After consideration of risks, benefits and other options for treatment, the patient has consented to  Procedure(s): TRANSESOPHAGEAL ECHOCARDIOGRAM (TEE) WITH LOOP (N/A) as a surgical intervention .  The patient's history has been reviewed, patient examined, no change in status, stable for surgery.  I have reviewed the patient's chart and labs.  Questions were answered to the patient's satisfaction.     Tobias Alexander

## 2017-11-06 ENCOUNTER — Inpatient Hospital Stay (HOSPITAL_COMMUNITY)
Admission: RE | Admit: 2017-11-06 | Discharge: 2017-11-22 | DRG: 056 | Disposition: A | Discharge status: Home / Self Care | Payer: Medicare Other | Source: Intra-hospital | Attending: Physical Medicine & Rehabilitation | Admitting: Physical Medicine & Rehabilitation

## 2017-11-06 ENCOUNTER — Encounter (HOSPITAL_COMMUNITY): Payer: Self-pay

## 2017-11-06 ENCOUNTER — Other Ambulatory Visit: Payer: Self-pay

## 2017-11-06 DIAGNOSIS — I5032 Chronic diastolic (congestive) heart failure: Secondary | ICD-10-CM | POA: Diagnosis present

## 2017-11-06 DIAGNOSIS — Z951 Presence of aortocoronary bypass graft: Secondary | ICD-10-CM

## 2017-11-06 DIAGNOSIS — N182 Chronic kidney disease, stage 2 (mild): Secondary | ICD-10-CM | POA: Diagnosis present

## 2017-11-06 DIAGNOSIS — I2510 Atherosclerotic heart disease of native coronary artery without angina pectoris: Secondary | ICD-10-CM | POA: Diagnosis present

## 2017-11-06 DIAGNOSIS — Z961 Presence of intraocular lens: Secondary | ICD-10-CM | POA: Diagnosis present

## 2017-11-06 DIAGNOSIS — I130 Hypertensive heart and chronic kidney disease with heart failure and stage 1 through stage 4 chronic kidney disease, or unspecified chronic kidney disease: Secondary | ICD-10-CM | POA: Diagnosis present

## 2017-11-06 DIAGNOSIS — I10 Essential (primary) hypertension: Secondary | ICD-10-CM | POA: Diagnosis not present

## 2017-11-06 DIAGNOSIS — R414 Neurologic neglect syndrome: Secondary | ICD-10-CM | POA: Diagnosis present

## 2017-11-06 DIAGNOSIS — E871 Hypo-osmolality and hyponatremia: Secondary | ICD-10-CM | POA: Diagnosis present

## 2017-11-06 DIAGNOSIS — Z86010 Personal history of colonic polyps: Secondary | ICD-10-CM

## 2017-11-06 DIAGNOSIS — Z87891 Personal history of nicotine dependence: Secondary | ICD-10-CM | POA: Diagnosis not present

## 2017-11-06 DIAGNOSIS — I69354 Hemiplegia and hemiparesis following cerebral infarction affecting left non-dominant side: Secondary | ICD-10-CM | POA: Diagnosis not present

## 2017-11-06 DIAGNOSIS — Z823 Family history of stroke: Secondary | ICD-10-CM

## 2017-11-06 DIAGNOSIS — I69312 Visuospatial deficit and spatial neglect following cerebral infarction: Secondary | ICD-10-CM | POA: Diagnosis present

## 2017-11-06 DIAGNOSIS — R8290 Unspecified abnormal findings in urine: Secondary | ICD-10-CM | POA: Diagnosis not present

## 2017-11-06 DIAGNOSIS — Z7902 Long term (current) use of antithrombotics/antiplatelets: Secondary | ICD-10-CM | POA: Diagnosis not present

## 2017-11-06 DIAGNOSIS — N400 Enlarged prostate without lower urinary tract symptoms: Secondary | ICD-10-CM | POA: Diagnosis present

## 2017-11-06 DIAGNOSIS — J302 Other seasonal allergic rhinitis: Secondary | ICD-10-CM | POA: Diagnosis present

## 2017-11-06 DIAGNOSIS — E785 Hyperlipidemia, unspecified: Secondary | ICD-10-CM | POA: Diagnosis present

## 2017-11-06 DIAGNOSIS — N179 Acute kidney failure, unspecified: Secondary | ICD-10-CM | POA: Diagnosis present

## 2017-11-06 DIAGNOSIS — N183 Chronic kidney disease, stage 3 unspecified: Secondary | ICD-10-CM

## 2017-11-06 DIAGNOSIS — Z8249 Family history of ischemic heart disease and other diseases of the circulatory system: Secondary | ICD-10-CM | POA: Diagnosis not present

## 2017-11-06 DIAGNOSIS — N390 Urinary tract infection, site not specified: Secondary | ICD-10-CM | POA: Diagnosis present

## 2017-11-06 DIAGNOSIS — G936 Cerebral edema: Secondary | ICD-10-CM | POA: Diagnosis present

## 2017-11-06 DIAGNOSIS — H53462 Homonymous bilateral field defects, left side: Secondary | ICD-10-CM | POA: Diagnosis not present

## 2017-11-06 DIAGNOSIS — D72829 Elevated white blood cell count, unspecified: Secondary | ICD-10-CM

## 2017-11-06 DIAGNOSIS — K5900 Constipation, unspecified: Secondary | ICD-10-CM | POA: Diagnosis present

## 2017-11-06 DIAGNOSIS — I63531 Cerebral infarction due to unspecified occlusion or stenosis of right posterior cerebral artery: Secondary | ICD-10-CM | POA: Diagnosis not present

## 2017-11-06 DIAGNOSIS — R338 Other retention of urine: Secondary | ICD-10-CM | POA: Diagnosis not present

## 2017-11-06 DIAGNOSIS — N401 Enlarged prostate with lower urinary tract symptoms: Secondary | ICD-10-CM | POA: Diagnosis not present

## 2017-11-06 DIAGNOSIS — I6340 Cerebral infarction due to embolism of unspecified cerebral artery: Secondary | ICD-10-CM

## 2017-11-06 DIAGNOSIS — I69398 Other sequelae of cerebral infarction: Secondary | ICD-10-CM | POA: Diagnosis not present

## 2017-11-06 DIAGNOSIS — A499 Bacterial infection, unspecified: Secondary | ICD-10-CM | POA: Diagnosis not present

## 2017-11-06 LAB — BASIC METABOLIC PANEL
ANION GAP: 10 (ref 5–15)
BUN: 9 mg/dL (ref 6–20)
CHLORIDE: 102 mmol/L (ref 101–111)
CO2: 23 mmol/L (ref 22–32)
CREATININE: 0.98 mg/dL (ref 0.61–1.24)
Calcium: 9 mg/dL (ref 8.9–10.3)
GFR calc non Af Amer: >60 mL/min (ref >60)
Glucose, Bld: 149 mg/dL — ABNORMAL HIGH (ref 65–99)
Potassium: 3.7 mmol/L (ref 3.5–5.1)
SODIUM: 135 mmol/L (ref 135–145)

## 2017-11-06 LAB — CBC
HEMATOCRIT: 40.4 % (ref 39.0–52.0)
HEMOGLOBIN: 13.7 g/dL (ref 13.0–17.0)
MCH: 30.9 pg (ref 26.0–34.0)
MCHC: 33.9 g/dL (ref 30.0–36.0)
MCV: 91.2 fL (ref 78.0–100.0)
Platelets: 201 10*3/uL (ref 150–400)
RBC: 4.43 MIL/uL (ref 4.22–5.81)
RDW: 12.9 % (ref 11.5–15.5)
WBC: 8.6 10*3/uL (ref 4.0–10.5)

## 2017-11-06 MED ORDER — CEPHALEXIN 250 MG PO CAPS
500.0000 mg | ORAL_CAPSULE | Freq: Two times a day (BID) | ORAL | Status: DC
  Administered 2017-11-07 – 2017-11-18 (×24): 500 mg via ORAL
  Filled 2017-11-06 (×24): qty 2

## 2017-11-06 MED ORDER — GUAIFENESIN ER 600 MG PO TB12
1200.0000 mg | ORAL_TABLET | Freq: Two times a day (BID) | ORAL | Status: DC | PRN
Start: 2017-11-06 — End: 2017-11-22

## 2017-11-06 MED ORDER — PROCHLORPERAZINE 25 MG RE SUPP: 12.5000 mg | Freq: Four times a day (QID) | RECTAL | Status: DC | PRN

## 2017-11-06 MED ORDER — ATORVASTATIN CALCIUM 80 MG PO TABS
80.0000 mg | ORAL_TABLET | Freq: Every day | ORAL | Status: DC
  Administered 2017-11-07 – 2017-11-22 (×16): 80 mg via ORAL
  Filled 2017-11-06 (×16): qty 1

## 2017-11-06 MED ORDER — GUAIFENESIN-DM 100-10 MG/5ML PO SYRP: 5.0000 mL | ORAL_SOLUTION | Freq: Four times a day (QID) | ORAL | Status: DC | PRN

## 2017-11-06 MED ORDER — PROCHLORPERAZINE EDISYLATE 5 MG/ML IJ SOLN: 5.0000 mg | Freq: Four times a day (QID) | INTRAMUSCULAR | Status: DC | PRN

## 2017-11-06 MED ORDER — AYR SALINE NASAL NA GEL: 1.0000 "application " | Freq: Two times a day (BID) | NASAL | Status: DC

## 2017-11-06 MED ORDER — BISACODYL 10 MG RE SUPP: 10.0000 mg | Freq: Every day | RECTAL | Status: DC | PRN

## 2017-11-06 MED ORDER — ASPIRIN EC 325 MG PO TBEC
325.0000 mg | DELAYED_RELEASE_TABLET | Freq: Every day | ORAL | Status: DC
  Administered 2017-11-07 – 2017-11-22 (×16): 325 mg via ORAL
  Filled 2017-11-06 (×16): qty 1

## 2017-11-06 MED ORDER — LORATADINE 10 MG PO TABS
10.0000 mg | ORAL_TABLET | Freq: Every day | ORAL | Status: DC
  Administered 2017-11-07 – 2017-11-22 (×16): 10 mg via ORAL
  Filled 2017-11-06 (×16): qty 1

## 2017-11-06 MED ORDER — ENOXAPARIN SODIUM 40 MG/0.4ML ~~LOC~~ SOLN
40.0000 mg | SUBCUTANEOUS | Status: DC
  Administered 2017-11-07 – 2017-11-22 (×16): 40 mg via SUBCUTANEOUS
  Filled 2017-11-06 (×16): qty 0.4

## 2017-11-06 MED ORDER — POLYETHYLENE GLYCOL 3350 17 G PO PACK
17.0000 g | PACK | Freq: Every day | ORAL | Status: DC | PRN
Start: 2017-11-06 — End: 2017-11-22
  Administered 2017-11-07 – 2017-11-21 (×2): 17 g via ORAL
  Filled 2017-11-06 (×2): qty 1

## 2017-11-06 MED ORDER — CEPHALEXIN 500 MG PO CAPS: 500.0000 mg | ORAL_CAPSULE | Freq: Two times a day (BID) | ORAL | Status: DC

## 2017-11-06 MED ORDER — CEPHALEXIN 500 MG PO CAPS
500.0000 mg | ORAL_CAPSULE | Freq: Two times a day (BID) | ORAL | Status: DC
  Administered 2017-11-06: 500 mg via ORAL
  Filled 2017-11-06: qty 1

## 2017-11-06 MED ORDER — METOPROLOL TARTRATE 50 MG PO TABS
50.0000 mg | ORAL_TABLET | Freq: Two times a day (BID) | ORAL | Status: DC
  Administered 2017-11-06 – 2017-11-22 (×32): 50 mg via ORAL
  Filled 2017-11-06 (×33): qty 1

## 2017-11-06 MED ORDER — FOLIC ACID 1 MG PO TABS
1.0000 mg | ORAL_TABLET | Freq: Every day | ORAL | Status: DC
  Administered 2017-11-07 – 2017-11-22 (×16): 1 mg via ORAL
  Filled 2017-11-06 (×16): qty 1

## 2017-11-06 MED ORDER — TRAZODONE HCL 50 MG PO TABS
25.0000 mg | ORAL_TABLET | Freq: Every evening | ORAL | Status: DC | PRN
  Administered 2017-11-11: 25 mg via ORAL
  Administered 2017-11-12 – 2017-11-21 (×6): 50 mg via ORAL
  Filled 2017-11-06 (×7): qty 1

## 2017-11-06 MED ORDER — DIPHENHYDRAMINE HCL 12.5 MG/5ML PO ELIX
12.5000 mg | ORAL_SOLUTION | Freq: Four times a day (QID) | ORAL | Status: DC | PRN
  Administered 2017-11-07: 12.5 mg via ORAL
  Administered 2017-11-10 – 2017-11-21 (×10): 25 mg via ORAL
  Filled 2017-11-06 (×3): qty 10
  Filled 2017-11-06: qty 0
  Filled 2017-11-06 (×7): qty 10

## 2017-11-06 MED ORDER — ACETAMINOPHEN 325 MG PO TABS
325.0000 mg | ORAL_TABLET | ORAL | Status: DC | PRN
  Administered 2017-11-19: 325 mg via ORAL
  Administered 2017-11-21: 650 mg via ORAL
  Filled 2017-11-06 (×2): qty 2

## 2017-11-06 MED ORDER — FLEET ENEMA 7-19 GM/118ML RE ENEM: 1.0000 | ENEMA | Freq: Once | RECTAL | Status: DC | PRN

## 2017-11-06 MED ORDER — ASPIRIN 325 MG PO TBEC: 325.0000 mg | DELAYED_RELEASE_TABLET | Freq: Every day | ORAL | 0 refills | Status: DC

## 2017-11-06 MED ORDER — SALINE SPRAY 0.65 % NA SOLN
1.0000 | Freq: Two times a day (BID) | NASAL | Status: DC
  Administered 2017-11-06 – 2017-11-21 (×23): 1 via NASAL
  Filled 2017-11-06: qty 44

## 2017-11-06 MED ORDER — HYDROCHLOROTHIAZIDE 12.5 MG PO CAPS
12.5000 mg | ORAL_CAPSULE | Freq: Every day | ORAL | Status: DC
  Administered 2017-11-07 – 2017-11-20 (×14): 12.5 mg via ORAL
  Filled 2017-11-06 (×14): qty 1

## 2017-11-06 MED ORDER — CLOPIDOGREL BISULFATE 75 MG PO TABS
75.0000 mg | ORAL_TABLET | Freq: Every day | ORAL | Status: DC
  Administered 2017-11-07 – 2017-11-22 (×16): 75 mg via ORAL
  Filled 2017-11-06 (×16): qty 1

## 2017-11-06 MED ORDER — ATORVASTATIN CALCIUM 80 MG PO TABS: 80.0000 mg | ORAL_TABLET | Freq: Every day | ORAL | Status: DC

## 2017-11-06 MED ORDER — ALUM & MAG HYDROXIDE-SIMETH 200-200-20 MG/5ML PO SUSP: 30.0000 mL | ORAL | Status: DC | PRN

## 2017-11-06 MED ORDER — OMEGA-3-ACID ETHYL ESTERS 1 G PO CAPS
1.0000 g | ORAL_CAPSULE | Freq: Every day | ORAL | Status: DC
Start: 2017-11-07 — End: 2017-11-22
  Administered 2017-11-07 – 2017-11-22 (×16): 1 g via ORAL
  Filled 2017-11-06 (×16): qty 1

## 2017-11-06 MED ORDER — PROCHLORPERAZINE MALEATE 5 MG PO TABS: 5.0000 mg | ORAL_TABLET | Freq: Four times a day (QID) | ORAL | Status: DC | PRN

## 2017-11-06 MED ORDER — FLUTICASONE PROPIONATE 50 MCG/ACT NA SUSP
2.0000 | Freq: Every day | NASAL | Status: DC
  Administered 2017-11-06 – 2017-11-22 (×16): 2 via NASAL
  Filled 2017-11-06: qty 16

## 2017-11-06 NOTE — H&P (Signed)
Physical Medicine and Rehabilitation Admission H&P    Chief Complaint  Patient presents with  . Functional deficits due to stroke      HPI:  Thomas [name]. is a 77 y.o. 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 History taken from chart review, patient, and wife.  UDS negative. CT head reviewed, suggesting right PCA infarct.  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 Stroke work up initiated with DAPT recommended for embolic stroke due to unclear source. TEE showed EF 55-60% with lipomatous hypertrophy of atrial septum and no thrombus noted. Loop recorder placed yesterday. BLE dopplers were negative for DVT. He was started on rocephin at admission due to confusion and blood cultures X 2 done--pending. UCS with diphtheroids and antibiotics changed to Keflex today. 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 CIR recommended due to functional decline.    Review of Systems  Constitutional: Negative for chills and fever.  HENT: Negative for hearing loss and tinnitus.   Eyes: Negative for blurred vision.  Respiratory: Negative for cough, sputum production, shortness of breath and wheezing.   Cardiovascular: Negative for chest pain, palpitations and leg swelling.  Gastrointestinal: Negative for abdominal pain, constipation, heartburn and nausea.  Genitourinary: Negative for dysuria and urgency.  Musculoskeletal: Positive for falls (frequent due to left knee instability and left foot weakness).  Skin: Negative for itching and rash.  Neurological: Positive for dizziness, focal weakness, weakness and headaches.  Psychiatric/Behavioral: Positive for  depression. The patient does not have insomnia.   All other systems reviewed and are negative.    Past Medical History:  Diagnosis Date  . CHF (congestive heart failure) ([place]) 1999  . CKD (chronic kidney disease), stage II   . Coronary artery disease   . Hx of colonic polyps   . Hyperlipidemia   . Hyperplastic colon polyp 04/15/2007  . Hypertension   . Prostatic hypertrophy, benign    with elevated PSA  . TIA (transient ischemic attack) 2003   "mini stroke" (03/19/2013)    Past Surgical History:  Procedure Laterality Date  . CARDIAC CATHETERIZATION    . CATARACT EXTRACTION W/ INTRAOCULAR LENS  IMPLANT, BILATERAL Bilateral 1998  . COLONOSCOPY  2008  . CORONARY ARTERY BYPASS GRAFT  1999   x4  . LOOP RECORDER INSERTION N/A 11/05/2017   Procedure: LOOP RECORDER INSERTION;  Surgeon: Thompson Grayer, MD;  Location: [facility];  Service: Cardiovascular;  Laterality: N/A;    Family History  Problem Relation Age of Onset  . Coronary artery disease Mother   . Stroke Father     Social History:  Married. Used walker PTA-- has chair lift to get to bathroom upstairs. He reports that he quit smoking about 4 years ago. His smoking use included cigarettes. He has a 37.50 pack-year smoking history. He has never used smokeless tobacco. He reports that he drinks about 0.6 oz of alcohol per week. He reports that he does not use drugs.    Allergies  Allergen Reactions  . Sulfonamide Derivatives     REACTION: Weak,lethargic    Medications Prior to Admission  Medication Sig Dispense Refill  . atorvastatin (LIPITOR) 40 MG tablet TAKE 1 TABLET BY  MOUTH  DAILY 90 tablet 2  . clopidogrel (PLAVIX) 75 MG tablet TAKE 1 TABLET BY MOUTH  DAILY WITH BREAKFAST 90 tablet 3  . co-enzyme Q-10 50 MG capsule Take 50 mg by mouth daily.      . Flax OIL Take 1 capsule by mouth daily.      . folic acid (FOLVITE) 1 MG tablet TAKE 1 TABLET EVERY DAY 90 tablet 3  . hydrochlorothiazide (MICROZIDE) 12.5 MG  capsule Take 1 capsule (12.5 mg total) by mouth daily. 90 capsule 3  . loratadine (CLARITIN) 10 MG tablet Take 10 mg by mouth daily.    Marland Kitchen losartan (COZAAR) 25 MG tablet Take 1 tablet (25 mg total) by mouth daily. 90 tablet 3  . metoprolol tartrate (LOPRESSOR) 50 MG tablet TAKE 1 TABLET BY MOUTH TWO  TIMES DAILY 180 tablet 3  . Omega-3 Fatty Acids (FISH OIL) 1000 MG CAPS Take 1 capsule by mouth daily.        Drug Regimen Review  Drug regimen was reviewed and remains appropriate with no significant issues identified  Home: Home Living Family/patient expects to be discharged to:: Private residence Living Arrangements: Spouse/significant other Available Help at Discharge: Available 24 hours/day, Family Type of Home: House Home Access: Stairs to enter Technical brewer of Steps: 3-4 Entrance Stairs-Rails: None Home Layout: Two level, Bed/bath upstairs Alternate Level Stairs-Number of Steps: 14(but has chair lift) Alternate Level Stairs-Rails: Right Bathroom Shower/Tub: Chiropodist: Standard Home Equipment: Environmental consultant - 4 wheels, Shower seat   Functional History: Prior Function Level of Independence: Independent with assistive device(s) Comments: pt uses rollator for ambulation, assists to get into tub however pt able to complete dressing and bathing on own  Functional Status:  Mobility: Bed Mobility Overal bed mobility: Modified Independent General bed mobility comments: ModI with supine to sit Transfers Overall transfer level: Needs assistance Equipment used: Rolling walker (2 wheeled), None Transfers: Sit to/from Stand Sit to Stand: Min assist General transfer comment: Min assist with no AD in order to steady. Decreased eccentric control with stand to sit. Needs max VC's and tactile cueing for safety to turn fully with hips squared before attempting stand to sit.  Ambulation/Gait Ambulation/Gait assistance: Min assist, Mod assist Ambulation Distance  (Feet): 100 Feet Assistive device: Rolling walker (2 wheeled), 1 person hand held assist Gait Pattern/deviations: Step-to pattern, Decreased stride length, Decreased stance time - left, Decreased step length - left, Decreased weight shift to left, Decreased dorsiflexion - left, Narrow base of support, Trunk flexed General Gait Details: Patient requiring mod assist with ambulation without an AD; 2 LOB due to staggering, narrow BOS, and significantly decreased stride length. Requiring minA with RW and VC's to promote upright posture and RW proximity. L knee hyperextension during stance phase, which patient reports is chronic. Patient cannot dual task; tends to stop completely when performing head turns/scanning environment.   Gait velocity: slow Gait velocity interpretation: <1.8 ft/sec, indicative of risk for recurrent falls(1.3 ft/second)    ADL: ADL Overall ADL's : Needs assistance/impaired Eating/Feeding: Set up, Sitting Eating/Feeding Details (indicate cue type and reason): Pt finishing breakfast upon arrival Grooming: Oral care, Wash/dry face, Standing, Moderate assistance, Cueing for sequencing Grooming Details (indicate cue type and reason): Pt requiring Mod A for posterior lean while up at sink. Pt unable to maintain static standing without UE support or physical A. Pt requiring Max cues to attend to left visual field. Pt present with left visual field cut and inattention. Pt unable to locate  tooth brush or tooth paste on left side. Requiring Max cues to locate hot water faucet; pt repetatively turning on cold water and requiring Max cues to stop cold water and locate hot water. Upper Body Bathing: Minimal assistance, Sitting Lower Body Bathing: Maximal assistance, Sit to/from stand Upper Body Dressing : Minimal assistance, Sitting Lower Body Dressing: Maximal assistance, Sit to/from stand Toilet Transfer: Moderate assistance, Ambulation, RW(Simulated to recliner) Toilet Transfer Details  (indicate cue type and reason): Mod A for safe descent to recliner. Pt requiring multiple attempts to power up into standing Functional mobility during ADLs: Moderate assistance, Rolling walker General ADL Comments: Pt demonstrating decreased functional performance with deficits in balance, vision, and strength. Pt with poor awarness of deficits. Pt not noticing OT entered room due to left visual deficits.  Cognition: Cognition Overall Cognitive Status: Impaired/Different from baseline Orientation Level: Oriented X4 Cognition Arousal/Alertness: Awake/alert Behavior During Therapy: WFL for tasks assessed/performed Overall Cognitive Status: Impaired/Different from baseline Area of Impairment: Safety/judgement, Awareness, Problem solving Safety/Judgement: Decreased awareness of deficits, Decreased awareness of safety Awareness: Emergent Problem Solving: Slow processing, Difficulty sequencing, Requires verbal cues, Requires tactile cues General Comments: Pt with L sided inattention due to hemianopsia    Blood pressure 129/71, pulse 83, temperature 97.8 F (36.6 C), temperature source Oral, resp. rate 17, height '5\' 11"'  (1.803 m), weight 75.5 kg (166 lb 7.2 oz), SpO2 95 %. Physical Exam  Nursing note and vitals reviewed. Constitutional: He is oriented to person, place, and time. He appears well-developed and well-nourished. No distress.  HENT:  Head: Normocephalic and atraumatic.  Mouth/Throat: Oropharynx is clear and moist.  Nasal congestion noted  Eyes: Pupils are equal, round, and reactive to light. Conjunctivae and EOM are normal.  Bilateral allergic shiners  Neck: Normal range of motion. Neck supple.  Cardiovascular: Normal rate and regular rhythm.  No murmur heard. Dry dressing left chest  Respiratory: Effort normal and breath sounds normal. No stridor. No respiratory distress. He has no wheezes.  GI: Soft. Bowel sounds are normal. He exhibits no distension. There is no tenderness.   Musculoskeletal:  No edema or tenderness in extremities  Neurological: He is alert and oriented to person, place, and time.  Right gaze preference with left inattention.  Left visual field deficits with poor awareness of deficits. Mild dysarthria.  Able to follow one and two step commands without difficulty.  LUE: 4+/5 proximal to distal RLE: 4+-5/5 proximal to distal LLE: 4-4+/5 proximal to distal LUE mild ataxic with finger to nose.   Skin: Skin is warm and dry. He is not diaphoretic.  Psychiatric: He has a normal mood and affect. His behavior is normal. Thought content normal.    Results for orders placed or performed during the hospital encounter of 11/03/17 (from the past 48 hour(s))  CBC     Status: Abnormal   Collection Time: 11/05/17  4:10 AM  Result Value Ref Range   WBC 9.2 4.0 - 10.5 K/uL   RBC 4.04 (L) 4.22 - 5.81 MIL/uL   Hemoglobin 12.3 (L) 13.0 - 17.0 g/dL   HCT 36.6 (L) 39.0 - 52.0 %   MCV 90.6 78.0 - 100.0 fL   MCH 30.4 26.0 - 34.0 pg   MCHC 33.6 30.0 - 36.0 g/dL   RDW 12.9 11.5 - 15.5 %   Platelets 183 150 - 400 K/uL    Comment: Performed at [facility], [place] [address] [place], [place] [address]  Basic metabolic panel     Status:  Abnormal   Collection Time: 11/05/17  4:10 AM  Result Value Ref Range   Sodium 135 135 - 145 mmol/L   Potassium 3.7 3.5 - 5.1 mmol/L   Chloride 105 101 - 111 mmol/L   CO2 21 (L) 22 - 32 mmol/L   Glucose, Bld 128 (H) 65 - 99 mg/dL   BUN 13 6 - 20 mg/dL   Creatinine, Ser 0.96 0.61 - 1.24 mg/dL   Calcium 8.4 (L) 8.9 - 10.3 mg/dL   GFR calc non Af Amer >60 >60 mL/min   GFR calc Af Amer >60 >60 mL/min    Comment: (NOTE) The eGFR has been calculated using the CKD EPI equation. This calculation has not been validated in all clinical situations. eGFR's persistently <60 mL/min signify possible Chronic Kidney Disease.    Anion gap 9 5 - 15    Comment: Performed at [facility] [address], [place]  [address]  CBC     Status: None   Collection Time: 11/06/17  8:16 AM  Result Value Ref Range   WBC 8.6 4.0 - 10.5 K/uL   RBC 4.43 4.22 - 5.81 MIL/uL   Hemoglobin 13.7 13.0 - 17.0 g/dL   HCT 40.4 39.0 - 52.0 %   MCV 91.2 78.0 - 100.0 fL   MCH 30.9 26.0 - 34.0 pg   MCHC 33.9 30.0 - 36.0 g/dL   RDW 12.9 11.5 - 15.5 %   Platelets 201 150 - 400 K/uL    Comment: Performed at [facility] [address] [place], [place] [address]   Mr Brain 39 Contrast  Result Date: 11/04/2017 CLINICAL DATA:  77 y/o  M; evaluation of stroke. EXAM: MRI HEAD WITHOUT CONTRAST TECHNIQUE: Multiplanar, multiecho pulse sequences of the brain and surrounding structures were obtained without intravenous contrast. COMPARISON:  10/07/2017 CT head, CTA head, CT perfusion head. 03/19/2013 MRI head. FINDINGS: Brain: Right PCA distribution area of reduced diffusion involving the medial temporal lobe and occipital lobe compatible with acute/early subacute infarction. Few punctate foci of infarction are present in right cerebellum, right splenium of corpus callosum and right parietal lobe. Punctate foci of susceptibility hypointensity within right posteromedial temporal lobe (series 12,001 image 23) compatible with petechial hemorrhage. Extensive confluentnonspecific foci of T2 FLAIR hyperintense signal abnormality in subcortical and periventricular white matter are compatible withseverechronic microvascular ischemic changes for age. Moderatebrain parenchymal volume loss. Small chronic infarcts are present within the bilateral cerebellar hemispheres, pons, left thalamus, and right posterior limb of internal capsule. No extra-axial collection, effacement of basilar cisterns, or significant mass effect. Vascular: Normal central flow voids. Skull and upper cervical spine: Normal marrow signal. Sinuses/Orbits: Moderate diffuse paranasal sinus mucosal thickening with small fluid levels in the maxillary sinus. Trace opacification of left  mastoid air cells. Bilateral intra-ocular lens replacement. Other: None. IMPRESSION: 1. Right PCA distribution acute/early subacute infarction involving medial temporal lobe and occipital lobe. Punctate foci of infarction in right cerebellum, splenium of corpus callosum, and parietal lobe. Small area of petechial hemorrhage in right posteromedial temporal lobe. No significant mass effect. 2. Severe chronic microvascular ischemic changes and moderate parenchymal volume loss of the brain. 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 3. Moderate paranasal sinus disease with fluid levels which may represent acute sinusitis. Electronically Signed   By: Kristine Garbe M.D.   On: 11/04/2017 15:43       Medical Problem List and Plan: 1. 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  2.  DVT Prophylaxis/Anticoagulation: Pharmaceutical: Lovenox 3. Pain Management: tylenol prn 4. Mood: LCSW to follow for evaluation and support.  5. Neuropsych: This patient is ?fully capable of making decisions on his own behalf. 6. Skin/Wound Care: routine pressure relief measures 7. Fluids/Electrolytes/Nutrition: Monitor I/O. Check lytes in am. 8. HTN: Monitor BID. Continue  9. CAD s/p CABG: On lipitor and ASA 10 CKD stage III: SCr- 1.8 at admission. Has improved with hydration. Continue to monitor serially.  11. UTI: Treated with rocephin Day # 3--change to Keflex for 2 more days? 12. Seasonal allergies v/s URI: Encourage fluid intake. Continue Claritin. Will add nose spray for nasal congestion.   Post Admission Physician Evaluation: 1. Preadmission assessment reviewed and changes made below. 2. Functional deficits secondary  to Right PCA infarct. 3. Patient is admitted to receive collaborative, interdisciplinary care between the physiatrist, rehab nursing staff, and therapy team. 4. Patient's level of  medical complexity and substantial therapy needs in context of that medical necessity cannot be provided at a lesser intensity of care such as a SNF. 5. Patient has experienced substantial functional loss from his/her baseline which was documented above under the "Functional History" and "Functional Status" headings.  Judging by the patient's diagnosis, physical exam, and functional history, the patient has potential for functional progress which will result in measurable gains while on inpatient rehab.  These gains will be of substantial and practical use upon discharge  in facilitating mobility and self-care at the household level. 7. Physiatrist will provide 24 hour management of medical needs as well as oversight of the therapy plan/treatment and provide guidance as appropriate regarding the interaction of the two. 7. 24 hour rehab nursing will assist with safety, disease management and patient education  and help integrate therapy concepts, techniques,education, etc. 8. PT will assess and treat for/with: Lower extremity strength, range of motion, stamina, balance, functional mobility, safety, adaptive techniques and equipment, coping skills, pain control, stroke education.   Goals are: Supervision. 9. OT will assess and treat for/with: ADL's, functional mobility, safety, upper extremity strength, adaptive techniques and equipment, ego support, and community reintegration.   Goals are: Supervision. Therapy may proceed with showering this patient. 10. SLP will assess and treat for/with: higher level cognition.  Goals are: Supervision/Mod I. 11. Case Management and Social Worker will assess and treat for psychological issues and discharge planning. 12. Team conference will be held weekly to assess progress toward goals and to determine barriers to discharge. 13. Patient will receive at least 3 hours of therapy per day at least 5 days per week. 14. ELOS: 6-10 days.       15. Prognosis:  good  Delice Lesch, MD, ABPMR Bary Leriche, PA-C 11/06/2017

## 2017-11-06 NOTE — Care Management Note (Signed)
Case Management Note  Patient Details  Name: Thomas [name]. MRN: [id number] Date of Birth: 04/24/1941  Subjective/Objective:                    Action/Plan: Pt discharging to CIR today. CM signing off.    Expected Discharge Date:  11/06/17               Expected Discharge Plan:  IP Rehab Facility  In-House Referral:     Discharge planning Services  CM Consult  Post Acute Care Choice:    Choice offered to:     DME Arranged:    DME Agency:     HH Arranged:    HH Agency:     Status of Service:  Completed, signed off  If discussed at [employer], dates discussed:    Additional Comments:  Kermit Balo, RN 11/06/2017, 1:26 PM

## 2017-11-06 NOTE — Progress Notes (Addendum)
Patient arrived to unit with spouse. Oriented to unit and verbalized understanding of need to call for assistance. Resting comfortably with call bell in place, no questions or discomfort at this time.

## 2017-11-06 NOTE — Progress Notes (Signed)
PMR Admission Coordinator Pre-Admission Assessment  Patient: Thomas [name]. is an 77 y.o., male MRN: [id number] DOB: November 06, 1940 Height: 5\' 11"  (180.3 cm) Weight: 75.5 kg (166 lb 7.2 oz)                                                                                                                                                  Insurance Information HMO:     PPO:      PCP:      IPA:      80/20:      OTHER: LPPO PRIMARY: [employer] Medicare       Policy#: [id number]      Subscriber: Self CM Name: Kathlen Brunswick       Phone#: [phone]     Fax#: [phone] Pre-Cert#: [id number] given by Jonny Ruiz on 11/06/17 for 7 days with updates due to the case manager by 4/2 if not requested earlier by fax    Employer: Retired Benefits:  Phone #: Verified online     Name: [email or web address] Eff. Date: 08/13/17     Deduct: $0      Out of Pocket Max: $3220      Life Max: N/A CIR: $150 a day, days 1-20      SNF: $0 a day, days 1-20; $50 a day, days 21-100 Outpatient: Necessity      Co-Pay: $20 Home Health: Necessity, 100%      Co-Pay: $0 DME: 80%     Co-Pay: 20% Providers: In-network  SECONDARY: None        Medicaid Application Date:       Case Manager:  Disability Application Date:       Case Worker:   Emergency Chief Operating Officer Information    Name Relation Home Work Mobile   Lagoy,[name] Spouse [phone]  [phone]     Current Medical History  Patient Admitting Diagnosis: Right PCA infarct   History of Present Illness: Thomas Parkeris a 76 y.o.malewith history of CKD, HTN, CHF, 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 History taken from chart review, patient, and wife.UDS negative.CT head reviewed, suggesting right PCA infarct. 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 Stroke work up initiated with DAPT recommended for embolic stroke due to unclear source.TEE showed EF 55-60% with lipomatous hypertrophy of atrial septum and no thrombus noted. BLE dopplers were negative for DVT.Patient with resultant right dense hemiopsia with left neglect (improving)in setting of prior left hemiparesis. CIR recommended due to functional decline and patient admitted 11/06/17.    NIH Total: 2  Past Medical History      Past Medical History:  Diagnosis Date  . CHF (congestive heart failure) (HCC) 1999  . CKD (chronic kidney disease), stage II   . Coronary artery disease   . Hx of colonic polyps   . Hyperlipidemia   . Hyperplastic colon polyp 04/15/2007  . Hypertension   . Prostatic hypertrophy, benign    with elevated PSA  . TIA (transient ischemic attack) 2003   "mini stroke" (03/19/2013)    Family History  family history includes Coronary artery disease in his mother; Stroke in his father.  Prior Rehab/Hospitalizations:  Has the patient had major surgery during 100 days prior to admission? No  Current Medications   Current Facility-Administered Medications:  .  acetaminophen (TYLENOL) tablet 650 mg, 650 mg, Oral, Q4H PRN **OR** acetaminophen (TYLENOL) solution 650 mg, 650 mg, Per Tube, Q4H PRN **OR** acetaminophen (TYLENOL) suppository 650 mg, 650 mg, Rectal, Q4H PRN, Allred, James, MD .  aspirin EC tablet 325 mg, 325 mg, Oral, Daily, Allred, James, MD, 325 mg at 11/05/17 1154 .  atorvastatin (LIPITOR) tablet 80 mg, 80 mg, Oral, Daily, Allred, James, MD, 80 mg at 11/05/17 1155 .  cephALEXin (KEFLEX) capsule 500 mg, 500 mg, Oral, Q12H, Vann, Jessica U, DO .  clopidogrel (PLAVIX) tablet 75 mg, 75 mg, Oral, Q breakfast, Allred, James, MD, 75 mg at 11/05/17 1155 .  enoxaparin (LOVENOX) injection 40 mg, 40 mg, Subcutaneous, Q24H, Allred, James, MD, 40 mg at 11/05/17 1200 .  folic acid (FOLVITE) tablet 1 mg,  1 mg, Oral, Daily, Allred, James, MD, 1 mg at 11/05/17 1154 .  guaiFENesin (MUCINEX) 12 hr tablet 1,200 mg, 1,200 mg, Oral, BID PRN, Hillis Range, MD, 1,200 mg at 11/05/17 1205 .  hydrALAZINE (APRESOLINE) injection 5 mg, 5 mg, Intravenous, Q2H PRN, Allred, James, MD .  hydrochlorothiazide (MICROZIDE) capsule 12.5 mg, 12.5 mg, Oral, Daily, Allred, James, MD, 12.5 mg at 11/05/17 1155 .  loratadine (CLARITIN) tablet 10 mg, 10 mg, Oral, Daily, Allred, James, MD, 10 mg at 11/05/17 1153 .  metoprolol tartrate (LOPRESSOR) tablet 50 mg, 50 mg, Oral, BID, Vann, Jessica U, DO, 50 mg at 11/05/17 2214 .  omega-3 acid ethyl esters (LOVAZA) capsule 1 g, 1 g, Oral, Daily, Allred, James, MD, 1 g at 11/05/17 1153 .  ondansetron (ZOFRAN) injection 4 mg, 4 mg, Intravenous, Q8H PRN, Allred, James, MD .  senna-docusate (Senokot-S) tablet 1 tablet, 1 tablet, Oral, QHS PRN, Allred, James, MD .  zolpidem (AMBIEN) tablet 5 mg, 5 mg, Oral, QHS PRN, Hillis Range, MD  Patients Current Diet: Fall precautions Diet Heart Room service appropriate? Yes; Fluid consistency: Thin Diet - low sodium heart healthy  Precautions / Restrictions Precautions Precautions: Fall Precaution Comments: L heminopsia Restrictions Weight Bearing Restrictions: No   Has the patient had 2 or more falls or a fall with injury in the past year?Yes  Prior Activity Level Community (5-7x/wk): Prior to admission patient was out in the community daily.  He enjoys going out to eat with friends, runnin errands with his spouse, and is active in his church.   Home Assistive Devices / Equipment Home Assistive Devices/Equipment: Environmental consultant (specify type), Cane (specify quad or straight), Dentures (specify type), Eyeglasses, Hearing aid Home Equipment: Walker - 4 wheels, Shower seat  Prior Device Use: Indicate devices/aids used by the patient prior to current illness, exacerbation or injury? Walker, Rollator   Prior Functional Level Prior  Function Level of Independence: Independent with assistive device(s) Comments: pt uses rollator for ambulation, assists to get into tub however pt able  to complete dressing and bathing on own  Self Care: Did the patient need help bathing, dressing, using the toilet or eating? Needed some help  Indoor Mobility: Did the patient need assistance with walking from room to room (with or without device)? Independent  Stairs: Did the patient need assistance with internal or external stairs (with or without device)? Needed some help dependent with chair lift for 14 steps but could manage 2 steps in from garage with walker on his own with wife providing supervision   Functional Cognition: Did the patient need help planning regular tasks such as shopping or remembering to take medications? Needed some help  Current Functional Level Cognition  Overall Cognitive Status: Impaired/Different from baseline Orientation Level: Oriented X4 Safety/Judgement: Decreased awareness of deficits, Decreased awareness of safety General Comments: Pt with L sided inattention due to hemianopsia     Extremity Assessment (includes Sensation/Coordination)  Upper Extremity Assessment: LUE deficits/detail LUE Deficits / Details: residual weakness from previous stroke. WFL for ADLs and FM tasks.   Lower Extremity Assessment: LLE deficits/detail LLE Deficits / Details: residual weakness from previous stroke    ADLs  Overall ADL's : Needs assistance/impaired Eating/Feeding: Set up, Sitting Eating/Feeding Details (indicate cue type and reason): Pt finishing breakfast upon arrival Grooming: Oral care, Wash/dry face, Standing, Moderate assistance, Cueing for sequencing Grooming Details (indicate cue type and reason): Pt requiring Mod A for posterior lean while up at sink. Pt unable to maintain static standing without UE support or physical A. Pt requiring Max cues to attend to left visual field. Pt present with left  visual field cut and inattention. Pt unable to locate tooth brush or tooth paste on left side. Requiring Max cues to locate hot water faucet; pt repetatively turning on cold water and requiring Max cues to stop cold water and locate hot water. Upper Body Bathing: Minimal assistance, Sitting Lower Body Bathing: Maximal assistance, Sit to/from stand Upper Body Dressing : Minimal assistance, Sitting Lower Body Dressing: Maximal assistance, Sit to/from stand Toilet Transfer: Moderate assistance, Ambulation, RW(Simulated to recliner) Toilet Transfer Details (indicate cue type and reason): Mod A for safe descent to recliner. Pt requiring multiple attempts to power up into standing Functional mobility during ADLs: Moderate assistance, Rolling walker General ADL Comments: Pt demonstrating decreased functional performance with deficits in balance, vision, and strength. Pt with poor awarness of deficits. Pt not noticing OT entered room due to left visual deficits.    Mobility  Overal bed mobility: Modified Independent General bed mobility comments: ModI with supine to sit    Transfers  Overall transfer level: Needs assistance Equipment used: Rolling walker (2 wheeled), None Transfers: Sit to/from Stand Sit to Stand: Min assist General transfer comment: Min assist with no AD in order to steady. Decreased eccentric control with stand to sit. Needs max VC's and tactile cueing for safety to turn fully with hips squared before attempting stand to sit.     Ambulation / Gait / Stairs / Wheelchair Mobility  Ambulation/Gait Ambulation/Gait assistance: Min assist, Mod assist Ambulation Distance (Feet): 100 Feet Assistive device: Rolling walker (2 wheeled), 1 person hand held assist Gait Pattern/deviations: Step-to pattern, Decreased stride length, Decreased stance time - left, Decreased step length - left, Decreased weight shift to left, Decreased dorsiflexion - left, Narrow base of support, Trunk  flexed General Gait Details: Patient requiring mod assist with ambulation without an AD; 2 LOB due to staggering, narrow BOS, and significantly decreased stride length. Requiring minA with RW and VC's to promote  upright posture and RW proximity. L knee hyperextension during stance phase, which patient reports is chronic. Patient cannot dual task; tends to stop completely when performing head turns/scanning environment.   Gait velocity: slow Gait velocity interpretation: <1.8 ft/sec, indicative of risk for recurrent falls(1.3 ft/second)    Posture / Balance Balance Overall balance assessment: Needs assistance Sitting-balance support: No upper extremity supported, Feet supported Sitting balance-Leahy Scale: Fair Standing balance support: Single extremity supported Standing balance-Leahy Scale: Poor Standing balance comment: dependent on RW    Special needs/care consideration BiPAP/CPAP: No CPM: No Continuous Drip IV: No Dialysis: No         Life Vest: No Oxygen: Np Special Bed: No Trach Size: Np Wound Vac (area): No Skin: Dry and bruising per chart review                                 Bowel mgmt: Continent, last BM 11/03/17 Bladder mgmt: Intermittent incontinence, external catheter in place Diabetic mgmt: No, [id number]         Previous Home Environment Living Arrangements: Spouse/significant other Available Help at Discharge: Available 24 hours/day, Family Type of Home: House Home Layout: Two level, Bed/bath upstairs Alternate Level Stairs-Rails: Right Alternate Level Stairs-Number of Steps: 14(but has chair lift) Home Access: Stairs to enter Entrance Stairs-Rails: None Entrance Stairs-Number of Steps: 3-4 Bathroom Shower/Tub: Engineer, manufacturing systems: Standard Home Care Services: No  Discharge Living Setting Plans for Discharge Living Setting: Patient's home, Lives with (comment)(spouse) Type of Home at Discharge: House Discharge Home Layout: Two  level, Bed/bath upstairs, 1/2 bath on main level Alternate Level Stairs-Rails: Right(has and used a lift chair ) Alternate Level Stairs-Number of Steps: 14 Discharge Home Access: Stairs to enter Entrance Stairs-Rails: None Entrance Stairs-Number of Steps: 2 from garage  Discharge Bathroom Shower/Tub: Tub/shower unit Discharge Bathroom Toilet: Standard Discharge Bathroom Accessibility: Yes How Accessible: Accessible via walker Does the patient have any problems obtaining your medications?: No  Social/Family/Support Systems Patient Roles: Spouse Contact Information: Spouse: Transport planner Anticipated Caregiver: Spouse Anticipated Industrial/product designer Information: see above Ability/Limitations of Caregiver: None Caregiver Availability: 24/7 Discharge Plan Discussed with Primary Caregiver: Yes Is Caregiver In Agreement with Plan?: Yes Does Caregiver/Family have Issues with Lodging/Transportation while Pt is in Rehab?: No  Goals/Additional Needs Patient/Family Goal for Rehab: PT/OT: Supervision  Expected length of stay: 6-9 days  Cultural Considerations: [employer]  Dietary Needs: Heart healthy diet restrictions  Equipment Needs: TBD Pt/Family Agrees to Admission and willing to participate: Yes  Decrease burden of Care through IP rehab admission: No  Possible need for SNF placement upon discharge: No  Patient Condition: This patient's medical and functional status has changed since the consult dated 11/04/17 in which the Rehabilitation Physician determined and documented that the patient was potentially appropriate for intensive rehabilitative care in an inpatient rehabilitation facility. Issues have been addressed and update has been discussed with Dr. Allena Katz and patient now appropriate for inpatient rehabilitation. Will admit to inpatient rehab today.   Preadmission Screen Completed By:  Fae Pippin, 11/06/2017 9:27  AM ______________________________________________________________________   Discussed status with Dr. Allena Katz on 11/06/17 at 0930 and received telephone approval for admission today.  Admission Coordinator:  Fae Pippin, time 0930/Date 11/06/17             Cosigned by: Marcello Fennel, MD at 11/06/2017 10:49 AM  Revision History

## 2017-11-06 NOTE — Progress Notes (Signed)
Inpatient Rehabilitation  I have received authorization for an IP Rehab admission, have medical clearance, and a bed to offer today. Plan to proceed with admission.  Discussed with team; call if questions.   Charlane Ferretti., CCC/SLP Admission Coordinator  [facility] Inpatient Rehabilitation  Cell [phone]

## 2017-11-06 NOTE — Progress Notes (Signed)
Physical Therapy Treatment Patient Details Name: Thomas [name]. MRN: [id number] DOB: 1941/03/15 Today's Date: 11/06/2017    History of Present Illness Thomas Parkeris a 77 y.o.malewith medical history significant ofstroke with mild left leg weakness, hypertension, hyperlipidemia, CAD, CABG, dCHF, CKD-II, who presents with altered mental status and difficulty seeing out of left eye. MRI revealed infarct of R PCA territory.    PT Comments    Patient is progressing towards their physical therapy goals. Session focused on incorporating scanning his environment with functional activities including searching for objects hidden around his room. With distractions in the hallway and dual tasking, patient displays decreased gait speed and tends to bump into objects due to lack of compensating for visual field deficits.  Plan for discharge to CIR today.     Follow Up Recommendations  CIR;Supervision/Assistance - 24 hour     Equipment Recommendations  None recommended by PT    Recommendations for Other Services Rehab consult     Precautions / Restrictions Precautions Precautions: Fall Precaution Comments: L heminopsia Restrictions Weight Bearing Restrictions: No    Mobility  Bed Mobility Overal bed mobility: Modified Independent             General bed mobility comments: ModI with supine to sit  Transfers Overall transfer level: Needs assistance Equipment used: Rolling walker (2 wheeled);None Transfers: Sit to/from Stand Sit to Stand: Min assist         General transfer comment: Min assist required to power up from sit to stand. Decreased eccentric control with stand to sit but improvement with practice.   Ambulation/Gait Ambulation/Gait assistance: Min assist Ambulation Distance (Feet): 100 Feet Assistive device: Rolling walker (2 wheeled);1 person hand held assist Gait Pattern/deviations: Step-to pattern;Decreased stride length;Decreased stance time -  left;Decreased step length - left;Decreased weight shift to left;Decreased dorsiflexion - left;Narrow base of support;Trunk flexed Gait velocity: slow Gait velocity interpretation: <1.8 ft/sec, indicative of risk for recurrent falls General Gait Details: Min assist required for ambulation with RW and max VC's for scanning environment, RW proximity, looking up, and upright posture. Patient with one instance of bumping into wall on L side. Left knee hyperextension in stance phase (which is baseline for patient). Displays slow gait speed with dual tasking.    Stairs            Wheelchair Mobility    Modified Rankin (Stroke Patients Only) Modified Rankin (Stroke Patients Only) Pre-Morbid Rankin Score: Moderate disability Modified Rankin: Moderately severe disability     Balance Overall balance assessment: Needs assistance Sitting-balance support: No upper extremity supported;Feet supported Sitting balance-Leahy Scale: Fair     Standing balance support: Bilateral upper extremity supported Standing balance-Leahy Scale: Poor Standing balance comment: dependent on RW                            Cognition Arousal/Alertness: Awake/alert Behavior During Therapy: WFL for tasks assessed/performed Overall Cognitive Status: Impaired/Different from baseline Area of Impairment: Safety/judgement;Awareness;Problem solving                         Safety/Judgement: Decreased awareness of deficits;Decreased awareness of safety Awareness: Emergent Problem Solving: Slow processing;Difficulty sequencing;Requires verbal cues;Requires tactile cues General Comments: Pt with L sided inattention due to hemianopsia       Exercises      General Comments General comments (skin integrity, edema, etc.): Wife present during session.  Pertinent Vitals/Pain Pain Assessment: No/denies pain    Home Living                      Prior Function            PT Goals  (current goals can now be found in the care plan section) Acute Rehab PT Goals Patient Stated Goal: home PT Goal Formulation: With patient/family Time For Goal Achievement: 11/11/17 Potential to Achieve Goals: Good Progress towards PT goals: Progressing toward goals    Frequency    Min 4X/week      PT Plan Current plan remains appropriate    Co-evaluation              AM-PAC PT "6 Clicks" Daily Activity  Outcome Measure  Difficulty turning over in bed (including adjusting bedclothes, sheets and blankets)?: A Lot Difficulty moving from lying on back to sitting on the side of the bed? : A Lot Difficulty sitting down on and standing up from a chair with arms (e.g., wheelchair, bedside commode, etc,.)?: Unable Help needed moving to and from a bed to chair (including a wheelchair)?: A Little Help needed walking in hospital room?: A Little Help needed climbing 3-5 steps with a railing? : A Lot 6 Click Score: 13    End of Session Equipment Utilized During Treatment: Gait belt Activity Tolerance: Patient tolerated treatment well Patient left: in chair;with call bell/phone within reach;with chair alarm set;with family/visitor present Nurse Communication: Mobility status PT Visit Diagnosis: Unsteadiness on feet (R26.81);Hemiplegia and hemiparesis Hemiplegia - Right/Left: Left Hemiplegia - dominant/non-dominant: Non-dominant     Time: 1112-1140 PT Time Calculation (min) (ACUTE ONLY): 28 min  Charges:  $Gait Training: 8-22 mins $Therapeutic Activity: 8-22 mins                    G Codes:       Thomas [name], PT, DPT Acute Rehabilitation Services  Pager: [phone]    Vanetta Mulders 11/06/2017, 1:05 PM

## 2017-11-06 NOTE — Progress Notes (Signed)
Physical Medicine and Rehabilitation Consult   Reason for Consult: Functional deficits due to stroke Referring Physician: Dr. Benjamine Mola   HPI: Thomas [name]. is a 77 y.o. 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 History taken from chart review, patient, and wife.  UDS negative. CT head reviewed, suggesting right PCA infarct.  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 Stroke work up initiated with DAPT recommended for embolic stroke due to unclear source. 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 CIR recommended due to functional decline.    Review of Systems  Constitutional: Positive for diaphoresis. Negative for chills and fever.  HENT: Negative for hearing loss and tinnitus.   Eyes: Negative for blurred vision and double vision.  Respiratory: Negative for cough and shortness of breath.   Cardiovascular: Negative for chest pain.  Gastrointestinal: Negative for diarrhea, heartburn and nausea.  Genitourinary: Negative for dysuria and urgency.  Musculoskeletal: Positive for falls. Negative for myalgias.  Skin: Negative for rash.  Neurological: Positive for focal weakness.  Psychiatric/Behavioral: The patient does not have insomnia.   All other systems reviewed and are negative.        Past Medical History:  Diagnosis Date  . CHF (congestive heart failure) (HCC) 1999  . CKD (chronic kidney disease), stage II   . Coronary artery disease   . Hx of colonic polyps   . Hyperlipidemia   . Hyperplastic colon polyp 04/15/2007  . Hypertension   . Prostatic hypertrophy, benign    with elevated PSA  . TIA (transient ischemic attack)  2003   "mini stroke" (03/19/2013)         Past Surgical History:  Procedure Laterality Date  . CARDIAC CATHETERIZATION    . CATARACT EXTRACTION W/ INTRAOCULAR LENS  IMPLANT, BILATERAL Bilateral 1998  . COLONOSCOPY  2008  . CORONARY ARTERY BYPASS GRAFT  1999   x4         Family History  Problem Relation Age of Onset  . Coronary artery disease Mother   . Stroke Father     Social History:   Married. Used walker PTA-- has chair lift to get to bathroom upstairs. He reports that he quit smoking about 4 years ago. His smoking use included cigarettes. He has a 37.50 pack-year smoking history. He has never used smokeless tobacco. He reports that he drinks about 0.6 oz of alcohol per week. He reports that he does not use drugs.         Allergies  Allergen Reactions  . Sulfonamide Derivatives     REACTION: Weak,lethargic          Medications Prior to Admission  Medication Sig Dispense Refill  . atorvastatin (LIPITOR) 40 MG tablet TAKE 1 TABLET BY MOUTH  DAILY 90 tablet 2  . clopidogrel (PLAVIX) 75 MG tablet TAKE 1 TABLET BY MOUTH  DAILY WITH BREAKFAST 90 tablet 3  . co-enzyme Q-10 50 MG capsule Take 50 mg by mouth daily.      . Flax OIL Take 1 capsule by mouth daily.      . folic acid (FOLVITE) 1 MG tablet TAKE 1 TABLET EVERY DAY 90 tablet 3  . hydrochlorothiazide (MICROZIDE) 12.5 MG capsule Take 1  capsule (12.5 mg total) by mouth daily. 90 capsule 3  . loratadine (CLARITIN) 10 MG tablet Take 10 mg by mouth daily.    Marland Kitchen losartan (COZAAR) 25 MG tablet Take 1 tablet (25 mg total) by mouth daily. 90 tablet 3  . metoprolol tartrate (LOPRESSOR) 50 MG tablet TAKE 1 TABLET BY MOUTH TWO  TIMES DAILY 180 tablet 3  . Omega-3 Fatty Acids (FISH OIL) 1000 MG CAPS Take 1 capsule by mouth daily.        Home: Home Living Family/patient expects to be discharged to:: Private residence Living Arrangements: Spouse/significant other Available Help at Discharge: Available  24 hours/day, Family Type of Home: House Home Access: Stairs to enter Secretary/administrator of Steps: 3-4 Entrance Stairs-Rails: None Home Layout: Two level, Bed/bath upstairs Alternate Level Stairs-Number of Steps: 14(but has chair lift) Alternate Level Stairs-Rails: Right Bathroom Shower/Tub: Engineer, manufacturing systems: Standard Home Equipment: Environmental consultant - 4 wheels, Shower seat  Functional History: Prior Function Level of Independence: Independent with assistive device(s) Comments: pt uses rollator for ambulation, assists to get into tub however pt able to complete dressing and bathing on own Functional Status:  Mobility: Bed Mobility Overal bed mobility: Modified Independent General bed mobility comments: OOB upon arrival Transfers Overall transfer level: Needs assistance Equipment used: Rolling walker (2 wheeled) Transfers: Sit to/from Stand Sit to Stand: Mod assist General transfer comment: minA to power up and steady pt during transition of hands from bed to walker. max directional v/c's to complete task. Mod A for safe descent Ambulation/Gait Ambulation/Gait assistance: Min assist Ambulation Distance (Feet): 120 Feet Assistive device: Rolling walker (2 wheeled) Gait Pattern/deviations: Step-to pattern, Decreased stride length, Decreased stance time - left, Decreased step length - left, Decreased weight shift to left, Decreased dorsiflexion - left, Narrow base of support General Gait Details: pt with L knee hyperextension during stance phase. pt required minA for walker management and navigating to the Left.   Gait velocity: slow Gait velocity interpretation: Below normal speed for age/gender  ADL: ADL Overall ADL's : Needs assistance/impaired Eating/Feeding: Set up, Sitting Eating/Feeding Details (indicate cue type and reason): Pt finishing breakfast upon arrival Grooming: Oral care, Wash/dry face, Standing, Moderate assistance, Cueing for sequencing Grooming  Details (indicate cue type and reason): Pt requiring Mod A for posterior lean while up at sink. Pt unable to maintain static standing without UE support or physical A. Pt requiring Max cues to attend to left visual field. Pt present with left visual field cut and inattention. Pt unable to locate tooth brush or tooth paste on left side. Requiring Max cues to locate hot water faucet; pt repetatively turning on cold water and requiring Max cues to stop cold water and locate hot water. Upper Body Bathing: Minimal assistance, Sitting Lower Body Bathing: Maximal assistance, Sit to/from stand Upper Body Dressing : Minimal assistance, Sitting Lower Body Dressing: Maximal assistance, Sit to/from stand Toilet Transfer: Moderate assistance, Ambulation, RW(Simulated to recliner) Toilet Transfer Details (indicate cue type and reason): Mod A for safe descent to recliner. Pt requiring multiple attempts to power up into standing Functional mobility during ADLs: Moderate assistance, Rolling walker General ADL Comments: Pt demonstrating decreased functional performance with deficits in balance, vision, and strength. Pt with poor awarness of deficits. Pt not noticing OT entered room due to left visual deficits.  Cognition: Cognition Overall Cognitive Status: Impaired/Different from baseline Orientation Level: Oriented to person, Oriented to place, Oriented to time Cognition Arousal/Alertness: Awake/alert Behavior During Therapy: [facility] for tasks assessed/performed Overall Cognitive  Status: Impaired/Different from baseline Area of Impairment: Safety/judgement, Awareness, Problem solving Safety/Judgement: Decreased awareness of deficits, Decreased awareness of safety(L sided neglect) Awareness: Emergent Problem Solving: Slow processing, Difficulty sequencing, Requires verbal cues, Requires tactile cues General Comments: pt with L sided neglect and required Max verbal cues to attend to objects and environment on left  side. Requiring increased time throughout session for slow processing  Blood pressure 125/68, pulse 72, temperature 98 F (36.7 C), temperature source Oral, resp. rate 18, height 5\' 11"  (1.803 m), weight 75.5 kg (166 lb 7.2 oz), SpO2 100 %. Physical Exam  Nursing note and vitals reviewed. Constitutional: He is oriented to person, place, and time. He appears well-developed and well-nourished. No distress.  HENT:  Head: Normocephalic and atraumatic.  Eyes: Pupils are equal, round, and reactive to light. Conjunctivae and EOM are normal. Right eye exhibits no discharge. Left eye exhibits no discharge.  Neck: Normal range of motion. Neck supple.  Cardiovascular: Normal rate and regular rhythm.  Respiratory: Effort normal and breath sounds normal. No respiratory distress. He has no wheezes.  GI: Soft. Bowel sounds are normal. There is no tenderness.  Musculoskeletal: He exhibits no edema or tenderness.  Neurological: He is alert and oriented to person, place, and time.  Speech clear.  Able to follow basic commands without difficulty.  Motor: RUE: 5/5 proximal to distal LUE: 4+/5 proximal to distal RLE: 4+-5/5 proximal to distal LLE: 4-4+/5 proximal to distal ?B/l UE mild ataxia  Skin: Skin is warm and dry. He is not diaphoretic.  Psychiatric: He has a normal mood and affect. His behavior is normal.  Assessment/Plan: Diagnosis: Right PCA infarct Labs and images independently reviewed.  Records reviewed and summated above. Stroke: Continue secondary stroke prophylaxis and Risk Factor Modification listed below:   Antiplatelet therapy:   Blood Pressure Management:  Continue current medication with prn's with permisive HTN per primary team Statin Agent:   Prediabetes management:   tor recovery:   1. Does the need for close, 24 hr/day medical supervision in concert with the patient's rehab needs make it unreasonable for this patient to be served in a less intensive setting? Potentially   2. 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 Due to safety, disease management and patient education, does the patient require 24 hr/day rehab nursing? Potentially 4. Does the patient require coordinated care of a physician, rehab nurse, PT (1-2 hrs/day, 5 days/week) and OT (1-2 hrs/day, 5 days/week) to address physical and functional deficits in the context of the above medical diagnosis(es)? Potentially Addressing deficits in the following areas: balance, endurance, locomotion, strength, transferring, bathing, dressing, toileting and psychosocial support 5. Can the patient actively participate in an intensive therapy program of at least 3 hrs of therapy per day at least 5 days per week? Yes 6. The potential for patient to make measurable gains while on inpatient rehab is good 7. Anticipated functional outcomes upon discharge from inpatient rehab are supervision  with PT, supervision with OT, n/a with SLP. 8. Estimated rehab length of stay to reach the above functional goals is: 6-9 days. 9. Anticipated D/C setting: Home 10. Anticipated post D/C treatments: HH therapy  and Home excercise program 11. Overall Rehab/Functional Prognosis: good  RECOMMENDATIONS: This patient's condition is appropriate for continued rehabilitative care in the following setting: Patient doing well on eval.  Will await reeval by therapies.  If patient continues to progress, recommend home with [facility] with PM&R outpt follow up, however, if deficits persist, recommend CIR. Patient has agreed to  participate in recommended program. Yes Note that insurance prior authorization may be required for reimbursement for recommended care.  Comment: Rehab Admissions Coordinator to follow up.  Maryla Morrow, MD, ABPMR Jacquelynn Cree, PA-C 11/04/2017          Revision History                        Routing History

## 2017-11-06 NOTE — Progress Notes (Signed)
SLP Cancellation Note  Patient Details Name: Thomas [name]. MRN: [id number] DOB: 1940/09/27   Cancelled treatment:       Reason Eval/Treat Not Completed: Other (comment)(SLP screened, cognitive deficits are present per wife and pt however pt with discharge to CIR in a few minutes and will defer evaluation for completion on CIR ) Wife and pt are agreeable.    Temiloluwa [place] 11/06/2017, 1:47 PM

## 2017-11-06 NOTE — Discharge Summary (Addendum)
Physician Discharge Summary  Thomas [name]. ZOX:[id number] DOB: Mar 14, 1941 DOA: 11/03/2017  PCP: Shirline Frees, NP  Admit date: 11/03/2017 Discharge date: 11/06/2017   Recommendations for Outpatient Follow-Up:   1. Keflex for UTI through 3/30 2. To CIR 3. Has loop recorder 4.  Per NEURO: now on aspirin 325 mg daily and clopidogrel 75 mg daily. Continue DAPT for 3 months and then plavix alone given hx of stroke and significant extra- and intracranial stenosis.      Discharge Diagnosis:   Principal Problem:   Stroke [facility]) Active Problems:   Essential hypertension   Coronary atherosclerosis   CAD, ARTERY BYPASS GRAFT   HLD (hyperlipidemia)   Chronic diastolic CHF (congestive heart failure) (HCC)   Acute renal failure superimposed on stage 2 chronic kidney disease (HCC)   Acute metabolic encephalopathy   UTI (urinary tract infection)   AKI (acute kidney injury) (HCC)   History of CVA with residual deficit   Prediabetes   Discharge disposition:  CIR  Discharge Condition: Improved.  Diet recommendation: Low sodium, heart healthy.  Carbohydrate-modified  Wound care: None.   History of Present Illness:   Thomas [name]. is a 77 y.o. male with medical history significant of stroke with mild left leg weakness, hypertension, hyperlipidemia, CAD, CABG, dCHF, CKD-II, who presents with altered mental status and difficulty seeing out of left eye.  Per pt's wife, pt was noted to be confused at about 8:30 PM. The patient was confused to his surroundings, not remember how to work the TV remote; could not remember where his bathroom was. Pt had episode of difficulty seeing out of left eye. 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 He has chronic mild left leg weakness from previous stroke, which did not change. Patient does not have new unilateral numbness or tingliness in extremities. Patient denies chest pain, shortness breath, cough, fever or chills. No nausea,  vomiting, diarrhea, abdominal pain or symptoms of UTI.     Hospital Course by Problem:   Stroke [facility]):  MRIright PCA large, right cerebellar and right MCA punctateinfarcts, remotelacunar infarcts in cerebellum, basal ganglia, and brainstem.  CTA head and neck - right P3 flow decreased, left ICA proximal string sign at bifurcation, b/l ICA siphon progressive atherosclerosis with stenosis, right VA V1 and V4 stenosis.  -s/p TEE -2d echo -ldl: 80 -per neuro: now on aspirin 325 mg daily and clopidogrel 75 mg daily. Continue DAPT for 3months and then plavix alonegiven hx of stroke and significant extra- and intracranial stenosis.  -CIR when insurance approved  Essential hypertension: -resume home meds including BB -run of vtach on tele   CAD:s/p of CBAG. No CP -ASA, lipitor -increased lipitor dose from 40 to 80 mg daily  HLD (hyperlipidemia): -Lipitor  Chronic diastolic CHF (congestive heart failure) (HCC):2-D echo on 10/14/17 showed EF of 55-60%.  -resume diuretic -appeared to be heading towards overhydration-- d/c IVF  AoCKD-II:  -improved with hydration but showing signs of volume overload-- resume diuretic  UTI -diphtheroides species -change IV rocephin to keflex to finish course       Medical Consultants:    Neuro/cards   Discharge Exam:   Vitals:   11/06/17 0443 11/06/17 0842  BP: 135/89 129/71  Pulse: (!) 59 83  Resp: 18 17  Temp: 98 F (36.7 C) 97.8 F (36.6 C)  SpO2: 96% 95%   Vitals:   11/05/17 2213 11/06/17 0013 11/06/17 0443 11/06/17 0842  BP: (!) 162/81 (!) 155/85 135/89 129/71  Pulse: 70 70 (!) 59 83  Resp:  18 18 17   Temp: 98.7 F (37.1 C) 97.9 F (36.6 C) 98 F (36.7 C) 97.8 F (36.6 C)  TempSrc: Oral Oral Oral Oral  SpO2: 98% 98% 96% 95%  Weight:      Height:        Gen:  NAD- resting   The results of significant diagnostics from this hospitalization (including imaging, microbiology, ancillary and laboratory)  are listed below for reference.     Procedures and Diagnostic Studies:   Ct Angio Head W Or Wo Contrast  Result Date: 11/04/2017 CLINICAL DATA:  77 year old male with left hemianopia. Unknown time of symptom onset. EXAM: CT ANGIOGRAPHY HEAD AND NECK CT PERFUSION BRAIN TECHNIQUE: Multidetector CT imaging of the head and neck was performed using the standard protocol during bolus administration of intravenous contrast. Multiplanar CT image reconstructions and MIPs were obtained to evaluate the vascular anatomy. Carotid stenosis measurements (when applicable) are obtained utilizing NASCET criteria, using the distal internal carotid diameter as the denominator. Multiphase CT imaging of the brain was performed following IV bolus contrast injection. Subsequent parametric perfusion maps were calculated using RAPID software. CONTRAST:  ISOVUE-370 IOPAMIDOL (ISOVUE-370) INJECTION 76% COMPARISON:  Head CT without contrast 0007 hours today. Brain MRI and MRA 03/19/2013 FINDINGS: CT Brain Perfusion Findings: CBF (<30%) Volume: zero, but erroneous - see next. Perfusion (Tmax>6.0s) volume: 30 mL, which corresponds to the cytotoxic edema seen on the 0007 hours noncontrast head CT today. Therefore, this is infarct core. Mismatch Volume: Probably zero Infarction Location:Right PCA, right occipital lobe CTA NECK Skeleton: Prior sternotomy. No acute osseous abnormality identified. Upper chest: Mild dependent atelectasis. No superior mediastinal lymphadenopathy. Mild gas and fluid distension of the thoracic esophagus. Other neck: Negative.  No neck mass or lymphadenopathy. Aortic arch: Mild bovine type arch configuration. Calcified arch atherosclerosis. Right carotid system: Tortuous brachiocephalic artery and proximal right CCA with a kinked appearance but no atherosclerotic stenosis. Soft and calcified plaque in the distal right CCA proximal to the bifurcation without stenosis. Bulky calcified plaque at the right carotid  bifurcation, but less than 50 % stenosis with respect to the distal vessel at the right ICA origin. Coarse calcified plaque continues into the distal bulb, but stenosis remains less than 50 % with respect to the distal vessel. Patent right ICA to the skull base. Left carotid system: No left CCA origin stenosis. Mildly tortuous proximal left CCA. Occasional plaque proximal to the left carotid bifurcation without stenosis. Bulky calcified plaque at the left carotid bifurcation with short segment radiographic string sign stenosis (series 10, image 112). The left ICA remains patent with no additional stenosis to the skull base despite some calcified plaque. Vertebral arteries: Tortuous proximal right subclavian artery without atherosclerotic stenosis. Right vertebral artery origin calcified plaque with moderate to severe stenosis. The right vertebral artery is non dominant with additional right V2 and V3 segment calcified plaque, but no other stenosis until the skull base. No proximal right subclavian artery stenosis despite plaque. Calcified plaque at the left vertebral artery origin with moderate to severe stenosis. Dominant left vertebral artery with additional calcified plaque in the V2 segments but no other stenosis to the skull base. CTA HEAD Posterior circulation: Calcified plaque in the right vertebral artery V4 segment with moderate to severe stenosis as the vessel crosses the dura and again just proximal to the right PICA origin which remains patent. This appears progressed since 2014. The right vertebral is patent to the vertebrobasilar  junction. The dominant distal left vertebral artery demonstrates calcified plaque with only mild stenosis. Normal left PICA origin. Patent basilar artery with stable irregularity since 2014 mostly in the proximal 3rd. Mild associated stenosis. Patent SCA and PCA origins. Posterior communicating arteries are diminutive or absent. The right P1 and P2 segments are patent with  mild irregularity. There is poor flow in the right P3 segments. The left PCA is patent with mild irregularity. Anterior circulation: Both ICA siphons are patent with extensive calcified atherosclerosis. There is moderate to severe short segment supraclinoid right ICA stenosis which may have progressed since 2014 (series 11 image 78). The right ICA terminus remains patent. On the left there is moderate distal petrous segment stenosis due to calcified plaque. 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 There is moderate to severe anterior genu segment stenosis due to calcified plaque, and additional severe left supraclinoid segment stenosis due to calcified plaque. These appear progressed since 2014. The left ICA terminus remains patent. MCA and ACA origins are patent. ACA origins and A1 segments appear normal. Bilateral ACA branches are within normal limits. There is calcified plaque at the left MCA origin but no stenosis. The left M1 segment, left trifurcation, and left MCA branches are within normal limits. There is calcified plaque in the right MCA origin and M1 segment with only mild stenosis. The right MCA bifurcation and right MCA branches are patent with mild irregularity. No discrete right MCA branch occlusion is identified although there is decreased distal right MCA branch enhancement compared to the left side (series 12, image 13). Venous sinuses: Grossly patent, suboptimal timing for venous valuation. Anatomic variants: Dominant left vertebral artery. Mild bovine type arch configuration. Review of the MIP images confirms the above findings IMPRESSION: 1. Unreliable CBF results due to cytotoxic edema in the Right PCA territory. 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 This was discussed by telephone with Dr. Ritta Slot on 11/04/2017 at 1229 hours. 2. With regard to  #1, there is poor flow in the right P3 divisions, but the right P1 and P2 segments remain patent. 3. Positive for severe atherosclerosis and multifocal other high-grade intra- and extracranial stenosis: - high-grade Left ICA origin radiographic string sign stenosis due to bulky calcified plaque. - moderate to severe bilateral ICA siphon stenosis appears progressed since 2014 and is due to calcified plaque, with multifocal high-grade stenoses on the Left . - moderate to severe stenosis of the non dominant Right Vertebral Artery at both its origin and V4 segments. 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 4.  Aortic Atherosclerosis (ICD10-I70.0). Electronically Signed   By: Odessa Fleming M.D.   On: 11/04/2017 00:52   Ct Angio Neck W Or Wo Contrast  Result Date: 11/04/2017 CLINICAL DATA:  77 year old male with left hemianopia. Unknown time of symptom onset. EXAM: CT ANGIOGRAPHY HEAD AND NECK CT PERFUSION BRAIN TECHNIQUE: Multidetector CT imaging of the head and neck was performed using the standard protocol during bolus administration of intravenous contrast. Multiplanar CT image reconstructions and MIPs were obtained to evaluate the vascular anatomy. Carotid stenosis measurements (when applicable) are obtained utilizing NASCET criteria, using the distal internal carotid diameter as the denominator. Multiphase CT imaging of the brain was performed following IV bolus contrast injection. Subsequent parametric perfusion maps were calculated using RAPID software. CONTRAST:  ISOVUE-370 IOPAMIDOL (ISOVUE-370) INJECTION 76% COMPARISON:  Head CT without contrast 0007 hours today. Brain MRI and MRA 03/19/2013 FINDINGS: CT Brain Perfusion Findings: CBF (<30%) Volume: zero, but erroneous - see next. Perfusion (Tmax>6.0s) volume: 30 mL, which corresponds to the cytotoxic edema seen on the 0007 hours noncontrast head CT today. Therefore, this is infarct core. Mismatch Volume: Probably zero Infarction  Location:Right PCA, right occipital lobe CTA NECK Skeleton: Prior sternotomy. No acute osseous abnormality identified. Upper chest: Mild dependent atelectasis. No superior mediastinal lymphadenopathy. Mild gas and fluid distension of the thoracic esophagus. Other neck: Negative.  No neck mass or lymphadenopathy. Aortic arch: Mild bovine type arch configuration. Calcified arch atherosclerosis. Right carotid system: Tortuous brachiocephalic artery and proximal right CCA with a kinked appearance but no atherosclerotic stenosis. Soft and calcified plaque in the distal right CCA proximal to the bifurcation without stenosis. Bulky calcified plaque at the right carotid bifurcation, but less than 50 % stenosis with respect to the distal vessel at the right ICA origin. Coarse calcified plaque continues into the distal bulb, but stenosis remains less than 50 % with respect to the distal vessel. Patent right ICA to the skull base. Left carotid system: No left CCA origin stenosis. Mildly tortuous proximal left CCA. Occasional plaque proximal to the left carotid bifurcation without stenosis. Bulky calcified plaque at the left carotid bifurcation with short segment radiographic string sign stenosis (series 10, image 112). The left ICA remains patent with no additional stenosis to the skull base despite some calcified plaque. Vertebral arteries: Tortuous proximal right subclavian artery without atherosclerotic stenosis. Right vertebral artery origin calcified plaque with moderate to severe stenosis. The right vertebral artery is non dominant with additional right V2 and V3 segment calcified plaque, but no other stenosis until the skull base. No proximal right subclavian artery stenosis despite plaque. Calcified plaque at the left vertebral artery origin with moderate to severe stenosis. Dominant left vertebral artery with additional calcified plaque in the V2 segments but no other stenosis to the skull base. CTA HEAD Posterior  circulation: Calcified plaque in the right vertebral artery V4 segment with moderate to severe stenosis as the vessel crosses the dura and again just proximal to the right PICA origin which remains patent. This appears progressed since 2014. The right vertebral is patent to the vertebrobasilar junction. The dominant distal left vertebral artery demonstrates calcified plaque with only mild stenosis. Normal left PICA origin. Patent basilar artery with stable irregularity since 2014 mostly in the proximal 3rd. Mild associated stenosis. Patent SCA and PCA origins. Posterior communicating arteries are diminutive or absent. The right P1 and P2 segments are patent with mild irregularity. There is poor flow in the right P3 segments. The left PCA is patent with mild irregularity. Anterior circulation: Both ICA siphons are patent with extensive calcified atherosclerosis. There is moderate to severe short segment supraclinoid right ICA stenosis which may have progressed since 2014 (series 11 image 78). The right ICA terminus remains patent. On the left there is moderate distal petrous segment stenosis due to calcified plaque. 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 There is moderate to severe anterior genu segment stenosis due to calcified plaque, and additional severe left supraclinoid segment stenosis due to calcified plaque. These appear progressed since 2014. The left ICA terminus remains patent. MCA and ACA origins are patent. ACA origins and A1 segments appear normal. Bilateral ACA branches are within normal limits. There is calcified plaque at the left MCA origin but no stenosis. The left M1 segment, left trifurcation, and  left MCA branches are within normal limits. There is calcified plaque in the right MCA origin and M1 segment with only mild stenosis. The right MCA bifurcation and right MCA branches are patent with mild irregularity. No discrete right MCA branch occlusion is  identified although there is decreased distal right MCA branch enhancement compared to the left side (series 12, image 13). Venous sinuses: Grossly patent, suboptimal timing for venous valuation. Anatomic variants: Dominant left vertebral artery. Mild bovine type arch configuration. Review of the MIP images confirms the above findings IMPRESSION: 1. Unreliable CBF results due to cytotoxic edema in the Right PCA territory. 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 This was discussed by telephone with Dr. Ritta Slot on 11/04/2017 at 1229 hours. 2. With regard to #1, there is poor flow in the right P3 divisions, but the right P1 and P2 segments remain patent. 3. Positive for severe atherosclerosis and multifocal other high-grade intra- and extracranial stenosis: - high-grade Left ICA origin radiographic string sign stenosis due to bulky calcified plaque. - moderate to severe bilateral ICA siphon stenosis appears progressed since 2014 and is due to calcified plaque, with multifocal high-grade stenoses on the Left . - moderate to severe stenosis of the non dominant Right Vertebral Artery at both its origin and V4 segments. 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 4.  Aortic Atherosclerosis (ICD10-I70.0). Electronically Signed   By: Odessa Fleming M.D.   On: 11/04/2017 00:52   Mr Brain Wo Contrast  Result Date: 11/04/2017 CLINICAL DATA:  77 y/o  M; evaluation of stroke. EXAM: MRI HEAD WITHOUT CONTRAST TECHNIQUE: Multiplanar, multiecho pulse sequences of the brain and surrounding structures were obtained without intravenous contrast. COMPARISON:  10/07/2017 CT head, CTA head, CT perfusion head. 03/19/2013 MRI head. FINDINGS: Brain: Right PCA distribution area of reduced diffusion involving the medial temporal lobe and occipital lobe compatible with acute/early subacute infarction. Few  punctate foci of infarction are present in right cerebellum, right splenium of corpus callosum and right parietal lobe. Punctate foci of susceptibility hypointensity within right posteromedial temporal lobe (series 12,001 image 23) compatible with petechial hemorrhage. Extensive confluentnonspecific foci of T2 FLAIR hyperintense signal abnormality in subcortical and periventricular white matter are compatible withseverechronic microvascular ischemic changes for age. Moderatebrain parenchymal volume loss. Small chronic infarcts are present within the bilateral cerebellar hemispheres, pons, left thalamus, and right posterior limb of internal capsule. No extra-axial collection, effacement of basilar cisterns, or significant mass effect. Vascular: Normal central flow voids. Skull and upper cervical spine: Normal marrow signal. Sinuses/Orbits: Moderate diffuse paranasal sinus mucosal thickening with small fluid levels in the maxillary sinus. Trace opacification of left mastoid air cells. Bilateral intra-ocular lens replacement. Other: None. IMPRESSION: 1. Right PCA distribution acute/early subacute infarction involving medial temporal lobe and occipital lobe. Punctate foci of infarction in right cerebellum, splenium of corpus callosum, and parietal lobe. Small area of petechial hemorrhage in right posteromedial temporal lobe. No significant mass effect. 2. Severe chronic microvascular ischemic changes and moderate parenchymal volume loss of the brain. 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 3. Moderate paranasal sinus disease with fluid levels which may represent acute sinusitis. Electronically Signed   By: Mitzi Hansen M.D.   On: 11/04/2017 15:43   Ct Cerebral Perfusion W Contrast  Result Date: 11/04/2017 CLINICAL DATA:  77 year old male with left hemianopia. Unknown time of symptom onset. EXAM:  CT ANGIOGRAPHY HEAD AND NECK CT PERFUSION BRAIN TECHNIQUE: Multidetector CT  imaging of the head and neck was performed using the standard protocol during bolus administration of intravenous contrast. Multiplanar CT image reconstructions and MIPs were obtained to evaluate the vascular anatomy. Carotid stenosis measurements (when applicable) are obtained utilizing NASCET criteria, using the distal internal carotid diameter as the denominator. Multiphase CT imaging of the brain was performed following IV bolus contrast injection. Subsequent parametric perfusion maps were calculated using RAPID software. CONTRAST:  ISOVUE-370 IOPAMIDOL (ISOVUE-370) INJECTION 76% COMPARISON:  Head CT without contrast 0007 hours today. Brain MRI and MRA 03/19/2013 FINDINGS: CT Brain Perfusion Findings: CBF (<30%) Volume: zero, but erroneous - see next. Perfusion (Tmax>6.0s) volume: 30 mL, which corresponds to the cytotoxic edema seen on the 0007 hours noncontrast head CT today. Therefore, this is infarct core. Mismatch Volume: Probably zero Infarction Location:Right PCA, right occipital lobe CTA NECK Skeleton: Prior sternotomy. No acute osseous abnormality identified. Upper chest: Mild dependent atelectasis. No superior mediastinal lymphadenopathy. Mild gas and fluid distension of the thoracic esophagus. Other neck: Negative.  No neck mass or lymphadenopathy. Aortic arch: Mild bovine type arch configuration. Calcified arch atherosclerosis. Right carotid system: Tortuous brachiocephalic artery and proximal right CCA with a kinked appearance but no atherosclerotic stenosis. Soft and calcified plaque in the distal right CCA proximal to the bifurcation without stenosis. Bulky calcified plaque at the right carotid bifurcation, but less than 50 % stenosis with respect to the distal vessel at the right ICA origin. Coarse calcified plaque continues into the distal bulb, but stenosis remains less than 50 % with respect to the distal vessel. Patent right ICA to the skull base. Left carotid system: No left CCA origin  stenosis. Mildly tortuous proximal left CCA. Occasional plaque proximal to the left carotid bifurcation without stenosis. Bulky calcified plaque at the left carotid bifurcation with short segment radiographic string sign stenosis (series 10, image 112). The left ICA remains patent with no additional stenosis to the skull base despite some calcified plaque. Vertebral arteries: Tortuous proximal right subclavian artery without atherosclerotic stenosis. Right vertebral artery origin calcified plaque with moderate to severe stenosis. The right vertebral artery is non dominant with additional right V2 and V3 segment calcified plaque, but no other stenosis until the skull base. No proximal right subclavian artery stenosis despite plaque. Calcified plaque at the left vertebral artery origin with moderate to severe stenosis. Dominant left vertebral artery with additional calcified plaque in the V2 segments but no other stenosis to the skull base. CTA HEAD Posterior circulation: Calcified plaque in the right vertebral artery V4 segment with moderate to severe stenosis as the vessel crosses the dura and again just proximal to the right PICA origin which remains patent. This appears progressed since 2014. The right vertebral is patent to the vertebrobasilar junction. The dominant distal left vertebral artery demonstrates calcified plaque with only mild stenosis. Normal left PICA origin. Patent basilar artery with stable irregularity since 2014 mostly in the proximal 3rd. Mild associated stenosis. Patent SCA and PCA origins. Posterior communicating arteries are diminutive or absent. The right P1 and P2 segments are patent with mild irregularity. There is poor flow in the right P3 segments. The left PCA is patent with mild irregularity. Anterior circulation: Both ICA siphons are patent with extensive calcified atherosclerosis. There is moderate to severe short segment supraclinoid right ICA stenosis which may have progressed  since 2014 (series 11 image 78). The right ICA terminus remains patent. On the left there  is moderate distal petrous segment stenosis due to calcified plaque. 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 There is moderate to severe anterior genu segment stenosis due to calcified plaque, and additional severe left supraclinoid segment stenosis due to calcified plaque. These appear progressed since 2014. The left ICA terminus remains patent. MCA and ACA origins are patent. ACA origins and A1 segments appear normal. Bilateral ACA branches are within normal limits. There is calcified plaque at the left MCA origin but no stenosis. The left M1 segment, left trifurcation, and left MCA branches are within normal limits. There is calcified plaque in the right MCA origin and M1 segment with only mild stenosis. The right MCA bifurcation and right MCA branches are patent with mild irregularity. No discrete right MCA branch occlusion is identified although there is decreased distal right MCA branch enhancement compared to the left side (series 12, image 13). Venous sinuses: Grossly patent, suboptimal timing for venous valuation. Anatomic variants: Dominant left vertebral artery. Mild bovine type arch configuration. Review of the MIP images confirms the above findings IMPRESSION: 1. Unreliable CBF results due to cytotoxic edema in the Right PCA territory. 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 This was discussed by telephone with Dr. Ritta Slot on 11/04/2017 at 1229 hours. 2. With regard to #1, there is poor flow in the right P3 divisions, but the right P1 and P2 segments remain patent. 3. Positive for severe atherosclerosis and multifocal other high-grade intra- and extracranial stenosis: - high-grade Left ICA origin radiographic string sign stenosis due to bulky calcified plaque. -  moderate to severe bilateral ICA siphon stenosis appears progressed since 2014 and is due to calcified plaque, with multifocal high-grade stenoses on the Left . - moderate to severe stenosis of the non dominant Right Vertebral Artery at both its origin and V4 segments. 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 4.  Aortic Atherosclerosis (ICD10-I70.0). Electronically Signed   By: Odessa Fleming M.D.   On: 11/04/2017 00:52   Ct Head Code Stroke Wo Contrast  Result Date: 11/04/2017 CLINICAL DATA:  Code stroke. 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 Last known normal 1900 hours. EXAM: CT HEAD WITHOUT CONTRAST TECHNIQUE: Contiguous axial images were obtained from the base of the skull through the vertex without intravenous contrast. COMPARISON:  Brain MRI, intracranial MRA, and noncontrast head CT 03/19/2013 FINDINGS: Brain: Similar cerebral volume since 2014. 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 No cortical encephalomalacia identified, but there is abnormal hypodensity in the medial right occipital lobe on series 3, image 15 and coronal image 47. No associated hemorrhage or mass effect. No other cytotoxic edema identified. No midline shift, ventriculomegaly, mass effect, evidence of mass lesion, or acute intracranial hemorrhage. Vascular: Extensive calcified atherosclerosis at the skull base. Questionable abnormal hyperdensity of the right PCA on series 5, image 42. Skull: No acute osseous abnormality identified. Sinuses/Orbits: Widespread paranasal sinus mucosal thickening and fluid levels are largely new since 2014. The bilateral tympanic cavities and mastoids remain clear. Other: No acute orbit or scalp soft tissue findings. Orbits soft tissues appears stable since 2014. ASPECTS [facility] Stroke Program Early CT Score) - Ganglionic level infarction (caudate, lentiform nuclei, internal capsule, insula, M1-M3 cortex): 7 -  Supraganglionic infarction (M4-M6 cortex): 3 Total score (0-10 with 10 being normal): 10 IMPRESSION: 1. Positive for acute to subacute  right PCA territory infarct with cytotoxic edema but no hemorrhage or mass effect. 2. Suspicion of hyperdense right PCA indicating occlusion. 3. Underlying advanced chronic small vessel disease. 4. These results were communicated to Dr. Amada Jupiter at 12:22 amon 3/25/2019by text page via the [facility] messaging system. Electronically Signed   By: Odessa Fleming M.D.   On: 11/04/2017 00:23     Labs:   Basic Metabolic Panel: Recent Labs  Lab 11/03/17 2343 11/04/17 0001 11/05/17 0410  NA 137 139 135  K 4.2 4.1 3.7  CL 106 106 105  CO2 20*  --  21*  GLUCOSE 119* 118* 128*  BUN 30* 32* 13  CREATININE 1.80* 1.80* 0.96  CALCIUM 8.7*  --  8.4*   GFR Estimated Creatinine Clearance: 69.7 mL/min (by C-G formula based on SCr of 0.96 mg/dL). Liver Function Tests: Recent Labs  Lab 11/03/17 2343  AST 24  ALT 13*  ALKPHOS 96  BILITOT 0.7  PROT 6.5  ALBUMIN 3.6   No results for input(s): LIPASE, AMYLASE in the last 168 hours. No results for input(s): AMMONIA in the last 168 hours. Coagulation profile Recent Labs  Lab 11/03/17 2343  INR 1.13    CBC: Recent Labs  Lab 11/03/17 2343 11/04/17 0001 11/05/17 0410  WBC 10.2  --  9.2  NEUTROABS 7.7  --   --   HGB 13.6 13.6 12.3*  HCT 40.5 40.0 36.6*  MCV 92.5  --  90.6  PLT 200  --  183   Cardiac Enzymes: No results for input(s): CKTOTAL, CKMB, CKMBINDEX, TROPONINI in the last 168 hours. BNP: Invalid input(s): POCBNP CBG: No results for input(s): GLUCAP in the last 168 hours. D-Dimer No results for input(s): DDIMER in the last 72 hours. Hgb A1c Recent Labs    11/04/17 0539  HGBA1C 5.8*   Lipid Profile Recent Labs    11/04/17 0539  CHOL 138  HDL 42  LDLCALC 80  TRIG 80  CHOLHDL 3.3   Thyroid function studies No results for input(s): TSH, T4TOTAL, T3FREE, THYROIDAB in the last 72  hours.  Invalid input(s): FREET3 Anemia work up No results for input(s): VITAMINB12, FOLATE, FERRITIN, TIBC, IRON, RETICCTPCT in the last 72 hours. Microbiology Recent Results (from the past 240 hour(s))  Urine Culture     Status: Abnormal   Collection Time: 11/04/17  1:07 AM  Result Value Ref Range Status   Specimen Description URINE, RANDOM  Final   Special Requests   Final    NONE Performed at [facility], [address] [place], [place] [address]    Culture (A)  Final    70,000 COLONIES/mL DIPHTHEROIDS(CORYNEBACTERIUM SPECIES)   Report Status 11/05/2017 FINAL  Final  Culture, blood (Routine X 2) w Reflex to ID Panel     Status: None (Preliminary result)   Collection Time: 11/04/17  5:49 AM  Result Value Ref Range Status   Specimen Description BLOOD LEFT ANTECUBITAL  Final   Special Requests   Final    BOTTLES DRAWN AEROBIC AND ANAEROBIC Blood Culture adequate volume   Culture   Final    NO GROWTH 1 DAY Performed at [facility], [address], [place], [place] [address]    Report Status PENDING  Incomplete  Culture, blood (Routine X 2) w Reflex to ID Panel     Status: None (Preliminary result)   Collection Time: 11/04/17  5:58 AM  Result Value Ref Range Status   Specimen Description BLOOD RIGHT HAND  Final   Special Requests   Final    BOTTLES DRAWN AEROBIC AND ANAEROBIC Blood Culture adequate volume   Culture   Final    NO GROWTH 1 DAY Performed at [facility], [address] [place], [place] [address]    Report Status PENDING  Incomplete     Discharge Instructions:   Discharge Instructions    Ambulatory referral to Neurology   Complete by:  As directed    Pt will follow up with Dr. Pearlean Brownie at [facility] in about 4-6 weeks. Thanks.   Ambulatory referral to Physical Medicine Rehab   Complete by:  As directed    1 month post-stroke hospital follow up   Ambulatory referral to Vascular Surgery   Complete by:  As directed    Left ICA high grade  stenosis, asymptomatic   Diet - low sodium heart healthy   Complete by:  As directed    Increase activity slowly   Complete by:  As directed      Allergies as of 11/06/2017      Reactions   Sulfonamide Derivatives    REACTION: Weak,lethargic      Medication List    STOP taking these medications   losartan 25 MG tablet Commonly known as:  COZAAR     TAKE these medications   aspirin 325 MG EC tablet Take 1 tablet (325 mg total) by mouth daily.   atorvastatin 80 MG tablet Commonly known as:  LIPITOR Take 1 tablet (80 mg total) by mouth daily. What changed:    medication strength  how much to take   clopidogrel 75 MG tablet Commonly known as:  PLAVIX TAKE 1 TABLET BY MOUTH  DAILY WITH BREAKFAST   co-enzyme Q-10 50 MG capsule Take 50 mg by mouth daily.   Fish Oil 1000 MG Caps Take 1 capsule by mouth daily.   Flax Oil Take 1 capsule by mouth daily.   folic acid 1 MG tablet Commonly known as:  FOLVITE TAKE 1 TABLET EVERY DAY   hydrochlorothiazide 12.5 MG capsule Commonly known as:  MICROZIDE Take 1 capsule (12.5 mg total) by mouth daily.   loratadine 10 MG tablet Commonly known as:  CLARITIN Take 10 mg by mouth daily.   metoprolol tartrate 50 MG tablet Commonly known as:  LOPRESSOR TAKE 1 TABLET BY MOUTH TWO  TIMES DAILY      Follow-up Information    [facility] Follow up on 11/20/2017.   Specialty:  Cardiology Why:  10:00AM Contact information: [address], [address] [place] [address] [phone]       Micki Riley, MD. Schedule an appointment as soon as possible for a visit in 4 week(s).   Specialties:  Neurology, Radiology Contact information: [address] [place] [address] [phone]        [facility]. Schedule an appointment as soon as possible for a visit in 4 week(s).   Specialty:  Vascular Surgery Contact information: [address] [place] [address] [phone]       Shirline Frees, NP Follow up in 1 week(s).   Specialty:  Family Medicine Contact information: [address] [place] [address] [phone]            Time coordinating discharge: 37 min  Signed:  Joseph Art   [facility] 11/06/2017, 9:16 AM

## 2017-11-07 ENCOUNTER — Inpatient Hospital Stay (HOSPITAL_COMMUNITY): Payer: Medicare Other | Admitting: Speech Pathology

## 2017-11-07 ENCOUNTER — Inpatient Hospital Stay (HOSPITAL_COMMUNITY): Payer: Medicare Other

## 2017-11-07 ENCOUNTER — Inpatient Hospital Stay (HOSPITAL_COMMUNITY): Payer: Medicare Other | Admitting: Physical Therapy

## 2017-11-07 DIAGNOSIS — N183 Chronic kidney disease, stage 3 (moderate): Secondary | ICD-10-CM

## 2017-11-07 DIAGNOSIS — A499 Bacterial infection, unspecified: Secondary | ICD-10-CM

## 2017-11-07 DIAGNOSIS — I63531 Cerebral infarction due to unspecified occlusion or stenosis of right posterior cerebral artery: Secondary | ICD-10-CM

## 2017-11-07 DIAGNOSIS — N390 Urinary tract infection, site not specified: Secondary | ICD-10-CM

## 2017-11-07 LAB — CBC WITH DIFFERENTIAL/PLATELET
BASOS ABS: 0 10*3/uL (ref 0.0–0.1)
BASOS PCT: 0 %
EOS PCT: 7 %
Eosinophils Absolute: 0.6 10*3/uL (ref 0.0–0.7)
HCT: 41.3 % (ref 39.0–52.0)
Hemoglobin: 14 g/dL (ref 13.0–17.0)
Lymphocytes Relative: 32 %
Lymphs Abs: 2.9 10*3/uL (ref 0.7–4.0)
MCH: 31 pg (ref 26.0–34.0)
MCHC: 33.9 g/dL (ref 30.0–36.0)
MCV: 91.6 fL (ref 78.0–100.0)
MONO ABS: 0.7 10*3/uL (ref 0.1–1.0)
Monocytes Relative: 8 %
Neutro Abs: 4.7 10*3/uL (ref 1.7–7.7)
Neutrophils Relative %: 53 %
PLATELETS: 197 10*3/uL (ref 150–400)
RBC: 4.51 MIL/uL (ref 4.22–5.81)
RDW: 13.1 % (ref 11.5–15.5)
WBC: 9 10*3/uL (ref 4.0–10.5)

## 2017-11-07 LAB — COMPREHENSIVE METABOLIC PANEL
ALBUMIN: 3.4 g/dL — AB (ref 3.5–5.0)
ALT: 19 U/L (ref 17–63)
ANION GAP: 7 (ref 5–15)
AST: 39 U/L (ref 15–41)
Alkaline Phosphatase: 94 U/L (ref 38–126)
BUN: 13 mg/dL (ref 6–20)
CHLORIDE: 101 mmol/L (ref 101–111)
CO2: 25 mmol/L (ref 22–32)
Calcium: 8.8 mg/dL — ABNORMAL LOW (ref 8.9–10.3)
Creatinine, Ser: 1.13 mg/dL (ref 0.61–1.24)
GFR calc Af Amer: >60 mL/min (ref >60)
GFR calc non Af Amer: >60 mL/min (ref >60)
GLUCOSE: 111 mg/dL — AB (ref 65–99)
POTASSIUM: 3.7 mmol/L (ref 3.5–5.1)
Sodium: 133 mmol/L — ABNORMAL LOW (ref 135–145)
Total Bilirubin: 0.9 mg/dL (ref 0.3–1.2)
Total Protein: 6.5 g/dL (ref 6.5–8.1)

## 2017-11-07 NOTE — Evaluation (Signed)
Speech Language Pathology Assessment and Plan  Patient Details  Name: Thomas [name]. MRN: [id number] Date of Birth: July 13, 1941  SLP Diagnosis: Cognitive Impairments  Rehab Potential: Good ELOS: 7 days    Today's Date: 11/07/2017 SLP Individual Time: 1345-1445 SLP Individual Time Calculation (min): 60 min   Problem List:  Patient Active Problem List   Diagnosis Date Noted  . 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 Benign essential HTN   . Coronary artery disease involving native coronary artery of native heart without angina pectoris   . Stage 3 chronic kidney disease ([place])   . Acute lower UTI   . Seasonal allergies   . Stroke ([place]) 11/04/2017  . HLD (hyperlipidemia) 11/04/2017  . Chronic diastolic CHF (congestive heart failure) ([place]) 11/04/2017  . Acute renal failure superimposed on stage 2 chronic kidney disease ([place]) 11/04/2017  . Acute metabolic encephalopathy 38/93/7342  . UTI (urinary tract infection) 11/04/2017  . AKI (acute kidney injury) ([place])   . History of CVA with residual deficit   . Prediabetes   . Left hemiparesis ([place]) 03/19/2013  . Cerebral infarction ([place]) 03/19/2013  . Left leg weakness 03/19/2013  . CAD, ARTERY BYPASS GRAFT 09/30/2008  . 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 Borderline hyperglycemia 12/03/2007  . Other and unspecified hyperlipidemia 07/28/2007  . Essential hypertension 07/28/2007  . 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 Coronary atherosclerosis 07/28/2007  . BENIGN PROSTATIC HYPERTROPHY 07/28/2007  . COLONIC POLYPS, 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   Past Medical History:  Past Medical History:  Diagnosis Date  . CHF (congestive heart failure) ([place]) 1999  . CKD (chronic kidney disease), stage II   . Coronary artery disease   . Hx of colonic polyps   . Hyperlipidemia   . Hyperplastic colon polyp 04/15/2007  . Hypertension   . Prostatic hypertrophy, benign    with elevated PSA  . TIA (transient ischemic attack) 2003   "mini stroke"  (03/19/2013)   Past Surgical History:  Past Surgical History:  Procedure Laterality Date  . CARDIAC CATHETERIZATION    . CATARACT EXTRACTION W/ INTRAOCULAR LENS  IMPLANT, BILATERAL Bilateral 1998  . COLONOSCOPY  2008  . CORONARY ARTERY BYPASS GRAFT  1999   x4  . LOOP RECORDER INSERTION N/A 11/05/2017   Procedure: LOOP RECORDER INSERTION;  Surgeon: Thompson Grayer, MD;  Location: [facility];  Service: Cardiovascular;  Laterality: N/A;  . TEE WITHOUT CARDIOVERSION N/A 11/05/2017   Procedure: TRANSESOPHAGEAL ECHOCARDIOGRAM (TEE) WITH LOOP;  Surgeon: Dorothy Spark, MD;  Location: [facility] ENDOSCOPY;  Service: Cardiovascular;  Laterality: N/A;    Assessment / Plan / Recommendation Clinical Impression Bryker Fletchall Sobieski Jr.is a 77 y.o.malewith history of CKD, HTN, CHF, prior CVAwith left sided weakness and gait disorder; who was admitted on 11/03/17 with confusion, difficulty walking and visual changes. History taken from chart review, patient, and wife.UDS negative.CT head reviewed, suggesting right PCA infarct. 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 Stroke work up initiated with DAPT recommended for embolic stroke due to unclear source. TEE showed EF 55-60% with lipomatous hypertrophy of atrial septum and no thrombus noted. Loop recorder placed yesterday. BLE dopplers were negative for DVT. He was started on rocephin at admission due to confusion and blood cultures X 2 done--pending. UCS with diphtheroids and antibiotics changed to Keflex today. Patient with resultant right dense hemiopsia with left neglect (improving)in setting  of prior left hemiparesis. CIR recommended due to functional decline and was admited on 11/06/17.   Cognitive linguistic evaluation completed on 3/38/19. 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 Pt with deficits in left attention, visual deficits, semi-complex problem solving, selective attention and recall of new information. Skilled ST is required to address these deficits, increase functional independence and reduce caregiver burden. Anticipate that pt will need 24 hour supervision and possibly HHST at discharge.    Skilled Therapeutic Interventions          Skilled treatment session focused on completion of cognitive linguistic evaluation, see above. Pt required Min A to Mod A to stay on verbal tasks. He is better at maintaining attention to physical/structured tasks. Pt was returned to room, left upright in recliner with chair alarm on and all needs within reach. Continue per current plan of care.   SLP Assessment  Patient will need skilled [facility] during CIR admission    Recommendations  Patient destination: Home Follow up Recommendations: 24 hour supervision/assistance;Home Health SLP Equipment Recommended: None recommended by SLP    SLP Frequency 3 to 5 out of 7 days   SLP Duration  SLP Intensity  SLP Treatment/Interventions 7 days  Minumum of 1-2 x/day, 30 to 90 minutes  Cognitive remediation/compensation;Functional tasks;Patient/family education;Therapeutic Activities;Internal/external aids    Pain Pain Assessment Pain Scale: 0-10 Pain Score: 0-No pain  Prior Functioning Cognitive/Linguistic Baseline: Within functional limits Type of Home: House  Lives With: Spouse Available Help at Discharge: Available 24 hours/day;Family Vocation: Retired  Function:  Eating Eating   Modified Consistency Diet: No Eating Assist Level: Set up assist for           Cognition Comprehension Comprehension assist level: (P) Set up assist with hearing device  Expression   Expression assist level: (P) Expresses basic needs/ideas: With no assist  Social Interaction Social Interaction assist level: (P) Interacts  appropriately with others with medication or extra time (anti-anxiety, antidepressant).  Problem Solving Problem solving assist level: (P) Solves basic 75 - 89% of the time/requires cueing 10 - 24% of the time;Solves basic 50 - 74% of the time/requires cueing 25 - 49% of the time  Memory Memory assist level: (P) Recognizes or recalls 75 - 89% of the time/requires cueing 10 - 24% of the time   Short Term Goals: Week 1: SLP Short Term Goal 1 (Week 1): Pt will utilize external memory aids to recall dialy information with supervision cues.  SLP Short Term Goal 2 (Week 1): Pt will scan to left of midline with Min A cues to locate items in 75% of opportunities.  SLP Short Term Goal 3 (Week 1): Pt will demonstrate anticipatory awareness by listing safe vs. unsafe activities to participate in at home with Min A cues.  SLP Short Term Goal 4 (Week 1): Pt will demonstrate selective attention for ~ 45 minutes in moderately distracting environment with supervision cues.  SLP Short Term Goal 5 (Week 1): Pt will self-monitor and self-correct errors with Min A cues.   Refer to Care Plan for Long Term Goals  Recommendations for other services: None   Discharge Criteria: Patient will be discharged from SLP if patient refuses treatment 3 consecutive times without medical reason, if treatment goals not met, if there is a change in medical status, if patient makes no progress towards goals or if patient is discharged from hospital.  The above  assessment, treatment plan, treatment alternatives and goals were discussed and mutually agreed upon: by patient  Avaiyah Strubel Rutherford Nail 11/07/2017, 3:49 PM

## 2017-11-07 NOTE — Progress Notes (Signed)
Patient information reviewed and entered into eRehab system by Lillie Bollig, RN, CRRN, PPS Coordinator.  Information including medical coding and functional independence measure will be reviewed and updated through discharge.     Per nursing patient was given "Data Collection Information Summary for Patients in Inpatient Rehabilitation Facilities with attached "Privacy Act Statement-Health Care Records" upon admission.  

## 2017-11-07 NOTE — Evaluation (Signed)
Physical Therapy Assessment and Plan  Patient Details  Name: Thomas [name]. MRN: [id number] Date of Birth: 07-Nov-1940  PT Diagnosis: Abnormal posture, Abnormality of gait, Ataxia, Ataxic gait, Cognitive deficits, Hemiplegia dominant and Muscle weakness Rehab Potential: Good ELOS: 7-10 days    Today's Date: 11/07/2017 PT Individual Time: 1540-1705 PT Individual Time Calculation (min): 85 min    Problem List:  Patient Active Problem List   Diagnosis Date Noted  . 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 Benign essential HTN   . Coronary artery disease involving native coronary artery of native heart without angina pectoris   . Stage 3 chronic kidney disease ([place])   . Acute lower UTI   . Seasonal allergies   . Stroke ([place]) 11/04/2017  . HLD (hyperlipidemia) 11/04/2017  . Chronic diastolic CHF (congestive heart failure) ([place]) 11/04/2017  . Acute renal failure superimposed on stage 2 chronic kidney disease ([place]) 11/04/2017  . Acute metabolic encephalopathy 68/07/7516  . UTI (urinary tract infection) 11/04/2017  . AKI (acute kidney injury) ([place])   . History of CVA with residual deficit   . Prediabetes   . Left hemiparesis ([place]) 03/19/2013  . Cerebral infarction ([place]) 03/19/2013  . Left leg weakness 03/19/2013  . CAD, ARTERY BYPASS GRAFT 09/30/2008  . 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 Borderline hyperglycemia 12/03/2007  . Other and unspecified hyperlipidemia 07/28/2007  . Essential hypertension 07/28/2007  . 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 Coronary atherosclerosis 07/28/2007  . BENIGN PROSTATIC HYPERTROPHY 07/28/2007  . COLONIC POLYPS, 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    Past Medical History:  Past Medical History:  Diagnosis Date  . CHF (congestive heart failure) ([place]) 1999  . CKD (chronic kidney disease), stage II   . Coronary artery disease   . Hx of colonic polyps   . Hyperlipidemia   . Hyperplastic colon polyp 04/15/2007  . Hypertension   . Prostatic  hypertrophy, benign    with elevated PSA  . TIA (transient ischemic attack) 2003   "mini stroke" (03/19/2013)   Past Surgical History:  Past Surgical History:  Procedure Laterality Date  . CARDIAC CATHETERIZATION    . CATARACT EXTRACTION W/ INTRAOCULAR LENS  IMPLANT, BILATERAL Bilateral 1998  . COLONOSCOPY  2008  . CORONARY ARTERY BYPASS GRAFT  1999   x4  . LOOP RECORDER INSERTION N/A 11/05/2017   Procedure: LOOP RECORDER INSERTION;  Surgeon: Thompson Grayer, MD;  Location: [place] CV LAB;  Service: Cardiovascular;  Laterality: N/A;  . TEE WITHOUT CARDIOVERSION N/A 11/05/2017   Procedure: TRANSESOPHAGEAL ECHOCARDIOGRAM (TEE) WITH LOOP;  Surgeon: Dorothy Spark, MD;  Location: [facility] ENDOSCOPY;  Service: Cardiovascular;  Laterality: N/A;    Assessment & Plan Clinical Impression: Patient is a  77 y.o.malewith history of CKD, HTN, CHF, prior CVAwith left sided weakness and gait disorder; who was admitted on 11/03/17 with confusion, difficulty walking and visual changes. History taken from chart review, patient, and wife.UDS negative.CT head reviewed, suggesting right PCA infarct. 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 Stroke work up initiated with DAPT recommended for embolic stroke due to unclear source. TEE showed EF 55-60% with lipomatous hypertrophy of atrial septum and no thrombus noted. Loop recorder placed yesterday. BLE dopplers were negative for DVT. He was started on rocephin at admission due to confusion and blood cultures X 2 done--pending. UCS with diphtheroids and antibiotics changed to Keflex today. Patient  with resultant Left dense hemiopsia with left neglect (improving)in setting of prior left hemiparesis. Patient transferred to CIR on 11/06/2017 .   Patient currently requires min with mobility secondary to muscle weakness,  impaired timing and sequencing, motor apraxia, ataxia, decreased coordination and decreased motor planning, decreased visual perceptual skills, field cut and hemianopsia, decreased attention to left, decreased attention, decreased awareness, decreased problem solving, decreased safety awareness and delayed processing and decreased standing balance, decreased postural control, hemiplegia and decreased balance strategies.  Prior to hospitalization, patient was modified independent  with mobility and lived with Spouse in a House home.  Home access is 3-4Stairs to enter.  Patient will benefit from skilled PT intervention to maximize safe functional mobility, minimize fall risk and decrease caregiver burden for planned discharge home with 24 hour supervision.  Anticipate patient will benefit from follow up [place] at discharge.  PT - End of Session Activity Tolerance: Tolerates 10 - 20 min activity with multiple rests Endurance Deficit: Yes Endurance Deficit Description: Pt requires seated rest breaks following prolonged activity  PT Assessment Rehab Potential (ACUTE/IP ONLY): Good PT Barriers to Discharge: [place] home environment;Decreased caregiver support;Home environment access/layout;Behavior PT Patient demonstrates impairments in the following area(s): Balance;Behavior;Endurance;Motor;Perception;Safety;Sensory;Skin Integrity PT Transfers Functional Problem(s): Bed Mobility;Bed to Chair;Car;Furniture;Floor PT Locomotion Functional Problem(s): Ambulation;Wheelchair Mobility;Stairs PT Plan PT Intensity: Minimum of 1-2 x/day ,45 to 90 minutes PT Frequency: 5 out of 7 days PT Duration Estimated Length of Stay: 7-10 days  PT Treatment/Interventions: Ambulation/gait training;Balance/vestibular training;Cognitive remediation/compensation;Community reintegration;Discharge planning;Disease management/prevention;DME/adaptive equipment instruction;Functional electrical stimulation;Functional mobility  training;Patient/family education;Psychosocial support;Pain management;Neuromuscular re-education;Skin care/wound management;Splinting/orthotics;Therapeutic Exercise;Therapeutic Activities;Stair training;UE/LE Strength taining/ROM;Visual/perceptual remediation/compensation;UE/LE Coordination activities;Wheelchair propulsion/positioning PT Recommendation Follow Up Recommendations: Home health PT Patient destination: Home Equipment Recommended: To be determined Equipment Details: Pt has RW and Rollator  Skilled Therapeutic Intervention Pt received sitting in WC and agreeable to PT. PT instructed patient in PT Evaluation and initiated treatment intervention; see below for details.  PT educated patient in [place], rehab potential, rehab goals, and discharge recommendations. Patient returned to room and left sitting in [facility] with call bell in reach and all needs met.      PT Evaluation Precautions/Restrictions Precautions Precautions: Fall Precaution Comments: L hemianopsia Restrictions Weight Bearing Restrictions: No General   Vital SignsTherapy Vitals Temp: 97.6 F (36.4 C) Temp Source: Oral Pulse Rate: 68 Resp: 20 BP: 102/76 Patient Position (if appropriate): Sitting Oxygen Therapy SpO2: 97 % O2 Device: Room Air Pain Pain Assessment Pain Scale: 0-10 Pain Score: 0-No pain Home Living/Prior Functioning Home Living Available Help at Discharge: Available 24 hours/day;Family Type of Home: House Home Access: Stairs to enter Technical brewer of Steps: 3-4 Entrance Stairs-Rails: Can reach both Home Layout: Two level;Bed/bath upstairs Alternate Level Stairs-Number of Steps: 14 Alternate Level Stairs-Rails: Right Bathroom Toilet: Standard Bathroom Accessibility: Yes  Lives With: Spouse Prior Function Level of Independence: Independent with basic ADLs;Requires assistive device for independence Driving: No Vocation: Retired Comments: pt uses rollator for ambulation, assists to  get into tub however pt able to complete dressing and bathing on own. has chair lift to get to second level of house.  Vision/Perception  Vision - Assessment Eye Alignment: Impaired (comment) Ocular Range of Motion: Restricted on the left Tracking/Visual Pursuits: Decreased smoothness of horizontal tracking;Decreased smoothness of vertical tracking Saccades: Additional eye shifts occurred during testing;Decreased speed of saccadic movement Perception Perception: Impaired Inattention/Neglect: Does not attend to left visual field;Does not attend to left side of body Praxis Praxis: Impaired Praxis Impairment Details: Initiation;Motor planning Praxis-Other  Comments: Mild motor planning difficulties with L UE, some overshooting observed during reaching  Cognition Overall Cognitive Status: Impaired/Different from baseline Arousal/Alertness: Awake/alert Orientation Level: Oriented X4 Attention: Selective Sustained Attention: Appears intact Selective Attention: Impaired Selective Attention Impairment: Verbal complex;Functional complex Memory: Impaired Memory Impairment: Decreased short term memory Decreased Short Term Memory: Verbal basic;Functional basic Awareness: Impaired Awareness Impairment: Intellectual impairment Problem Solving: Impaired Problem Solving Impairment: Verbal complex;Functional complex Executive Function: Organizing;Reasoning;Sequencing Reasoning: Impaired Reasoning Impairment: Verbal complex;Functional complex Sequencing: Impaired Sequencing Impairment: Verbal complex;Functional complex Organizing: Impaired Organizing Impairment: Verbal complex;Functional complex Safety/Judgment: Impaired Sensation Sensation Light Touch: Impaired Detail Light Touch Impaired Details: Impaired LLE;Impaired LUE Stereognosis: Not tested Hot/Cold: Appears Intact Proprioception: Impaired Detail Proprioception Impaired Details: Impaired LLE;Impaired LUE Coordination Gross Motor  Movements are Fluid and Coordinated: No Fine Motor Movements are Fluid and Coordinated: Yes Coordination and Movement Description: decreased smoothness bil L.R.  Heel Shin Test: mild uncoordination on the R, dysmetria on the L Motor  Motor Motor: Abnormal postural alignment and control;Motor apraxia;Hemiplegia Motor - Skilled Clinical Observations: Pt with abnormal gait pattern at baseline, mild hemiplegia note with L inattnetion.   Mobility Bed Mobility Bed Mobility: Supine to Sit;Rolling Right;Rolling Left;Sit to Supine Rolling Right: 5: Supervision Rolling Right Details: Verbal cues for precautions/safety Rolling Left: 5: Supervision Rolling Left Details: Verbal cues for precautions/safety Supine to Sit: 5: Supervision Supine to Sit Details: Verbal cues for safe use of DME/AE Sit to Supine: 5: Supervision Sit to Supine - Details: Verbal cues for precautions/safety;Verbal cues for technique Transfers Transfers: Yes Sit to Stand: 4: Min assist Sit to Stand Details: Verbal cues for precautions/safety;Verbal cues for technique;Verbal cues for safe use of DME/AE Stand to Sit: 4: Min assist Stand to Sit Details (indicate cue type and reason): Verbal cues for technique;Verbal cues for safe use of DME/AE;Verbal cues for precautions/safety Squat Pivot Transfers: 4: Min assist Squat Pivot Transfer Details: Verbal cues for technique;Verbal cues for precautions/safety;Verbal cues for safe use of DME/AE;Verbal cues for gait pattern Squat Pivot Transfer Details (indicate cue type and reason): Car transfer: min assist ( verbal and visual cues for technique, AD management and safety.) Locomotion  Ambulation Ambulation: Yes Ambulation/Gait Assistance: 4: Min assist Ambulation Distance (Feet): 150 Feet Assistive device: Rolling walker Ambulation/Gait Assistance Details: Verbal cues for safe use of DME/AE;Verbal cues for precautions/safety;Verbal cues for gait pattern Gait Gait: Yes Gait  Pattern: Impaired Gait Pattern: Step-through pattern;Left genu recurvatum;Ataxic;Right flexed knee in stance;Narrow base of support Stairs / Additional Locomotion Stairs: Yes Stairs Assistance: 4: Min assist Stairs Assistance Details: Verbal cues for gait pattern;Verbal cues for safe use of DME/AE;Verbal cues for precautions/safety Stair Management Technique: Two rails Number of Stairs: 8 Height of Stairs: 6 Wheelchair Mobility Wheelchair Mobility: Yes Wheelchair Assistance: 4: Advertising account executive Details: Verbal cues for safe use of DME/AE;Verbal cues for technique;Verbal cues for Information systems manager: Both upper extremities Wheelchair Parts Management: Needs assistance  Trunk/Postural Assessment  Cervical Assessment Cervical Assessment: Exceptions to [facility] Cervical AROM Overall Cervical AROM: Other (comment)(forward head flexion) Thoracic Assessment Thoracic Assessment: Within Functional Limits Lumbar Assessment Lumbar Assessment: Within Functional Limits Postural Control Postural Control: Deficits on evaluation Trunk Control: Pt with poor standing balance  Balance Balance Balance Assessed: Yes Static Sitting Balance Static Sitting - Balance Support: No upper extremity supported Static Sitting - Level of Assistance: 6: Modified independent (Device/Increase time) Dynamic Sitting Balance Dynamic Sitting - Level of Assistance: 5: Stand by assistance Static Standing Balance Static Standing - Balance Support: No upper  extremity supported Static Standing - Level of Assistance: 5: Stand by assistance Dynamic Standing Balance Dynamic Standing - Balance Support: No upper extremity supported Dynamic Standing - Level of Assistance: 4: Min assist Extremity Assessment  RUE Assessment RUE Assessment: Exceptions to [facility] RUE Strength RUE Overall Strength Comments: Pt with strength deficits but overall functional during ADLs LUE Assessment LUE Assessment:  Exceptions to [facility] LUE Strength LUE Overall Strength Comments: Pt with strength deficits but functional overall during ADLs RLE Assessment RLE Assessment: Within Functional Limits LLE Assessment LLE Assessment: Exceptions to [facility] LLE Strength LLE Overall Strength Comments: 4/5 hip flexion. all others tested 5/5   See Function Navigator for Current Functional Status.   Refer to Care Plan for Long Term Goals  Recommendations for other services: Therapeutic Recreation  Stress management and Outing/community reintegration  Discharge Criteria: Patient will be discharged from PT if patient refuses treatment 3 consecutive times without medical reason, if treatment goals not met, if there is a change in medical status, if patient makes no progress towards goals or if patient is discharged from hospital.  The above assessment, treatment plan, treatment alternatives and goals were discussed and mutually agreed upon: by patient  Lorie Phenix 11/07/2017, 4:29 PM

## 2017-11-07 NOTE — Evaluation (Signed)
Occupational Therapy Assessment and Plan  Patient Details  Name: Thomas [name]. MRN: [id number] Date of Birth: Dec 15, 1940  OT Diagnosis: disturbance of vision and muscle weakness (generalized) Rehab Potential: Rehab Potential (ACUTE ONLY): Good ELOS: 5-7 days   Today's Date: 11/07/2017 OT Individual Time: 0900-1000 OT Individual Time Calculation (min): 60 min     Problem List:  Patient Active Problem List   Diagnosis Date Noted  . 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 Benign essential HTN   . Coronary artery disease involving native coronary artery of native heart without angina pectoris   . Stage 3 chronic kidney disease ([place])   . Acute lower UTI   . Seasonal allergies   . Stroke ([place]) 11/04/2017  . HLD (hyperlipidemia) 11/04/2017  . Chronic diastolic CHF (congestive heart failure) ([place]) 11/04/2017  . Acute renal failure superimposed on stage 2 chronic kidney disease ([place]) 11/04/2017  . Acute metabolic encephalopathy 98/92/1194  . UTI (urinary tract infection) 11/04/2017  . AKI (acute kidney injury) ([place])   . History of CVA with residual deficit   . Prediabetes   . Left hemiparesis ([place]) 03/19/2013  . Cerebral infarction ([place]) 03/19/2013  . Left leg weakness 03/19/2013  . CAD, ARTERY BYPASS GRAFT 09/30/2008  . 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 Borderline hyperglycemia 12/03/2007  . Other and unspecified hyperlipidemia 07/28/2007  . Essential hypertension 07/28/2007  . 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 Coronary atherosclerosis 07/28/2007  . BENIGN PROSTATIC HYPERTROPHY 07/28/2007  . COLONIC POLYPS, 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    Past Medical History:  Past Medical History:  Diagnosis Date  . CHF (congestive heart failure) ([place]) 1999  . CKD (chronic kidney disease), stage II   . Coronary artery disease   . Hx of colonic polyps   . Hyperlipidemia   . Hyperplastic colon polyp 04/15/2007  . Hypertension   . Prostatic hypertrophy, benign    with elevated PSA   . TIA (transient ischemic attack) 2003   "mini stroke" (03/19/2013)   Past Surgical History:  Past Surgical History:  Procedure Laterality Date  . CARDIAC CATHETERIZATION    . CATARACT EXTRACTION W/ INTRAOCULAR LENS  IMPLANT, BILATERAL Bilateral 1998  . COLONOSCOPY  2008  . CORONARY ARTERY BYPASS GRAFT  1999   x4  . LOOP RECORDER INSERTION N/A 11/05/2017   Procedure: LOOP RECORDER INSERTION;  Surgeon: Thompson Grayer, MD;  Location: [facility];  Service: Cardiovascular;  Laterality: N/A;  . TEE WITHOUT CARDIOVERSION N/A 11/05/2017   Procedure: TRANSESOPHAGEAL ECHOCARDIOGRAM (TEE) WITH LOOP;  Surgeon: Dorothy Spark, MD;  Location: [facility];  Service: Cardiovascular;  Laterality: N/A;    Assessment & Plan Clinical Impression:  Thomas Parkeris a 77 y.o.malewith history of CKD, HTN, CHF, prior CVAwith left sided weakness and gait disorder; who was admitted on 11/03/17 with confusion, difficulty walking and visual changes. History taken from chart review, patient, and wife.UDS negative.CT head reviewed, suggesting right PCA infarct. 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 Stroke work up initiated with DAPT recommended for embolic stroke due to unclear source. TEE showed EF 55-60% with lipomatous hypertrophy of atrial septum and no thrombus noted. Loop recorder placed yesterday. BLE dopplers were negative for DVT. He was started on rocephin at admission due to confusion and blood cultures X 2 done--pending. UCS with diphtheroids and antibiotics changed to Keflex today. Patient with resultant  right dense hemiopsia with left neglect (improving)in setting of prior left hemiparesis. CIR recommended due to functional decline.   Patient transferred to CIR on 11/06/2017 .    Patient currently requires min with basic self-care skills  secondary to muscle weakness, motor apraxia and decreased motor planning, decreased visual acuity and hemianopsia and decreased standing balance, decreased postural control and decreased balance strategies.  Prior to hospitalization, patient could complete ADLs with modified independent .  Patient will benefit from skilled intervention to decrease level of assist with basic self-care skills and increase level of independence with iADL prior to discharge home with care partner.  Anticipate patient will require minimal physical assistance and follow up home health.  OT - End of Session Activity Tolerance: Tolerates 10 - 20 min activity with multiple rests Endurance Deficit: Yes Endurance Deficit Description: Pt requires seated rest breaks following prolonged activity  OT Assessment Rehab Potential (ACUTE ONLY): Good OT Patient demonstrates impairments in the following area(s): Balance;Safety;Sensory;Endurance;Motor;Perception OT Basic ADL's Functional Problem(s): Grooming;Bathing;Dressing;Toileting OT Transfers Functional Problem(s): Toilet;Tub/Shower OT Plan OT Intensity: Minimum of 1-2 x/day, 45 to 90 minutes OT Frequency: 5 out of 7 days OT Duration/Estimated Length of Stay: 5-7 days OT Treatment/Interventions: Balance/vestibular training;Discharge planning;Self Care/advanced ADL retraining;Therapeutic Activities;UE/LE Coordination activities;DME/adaptive equipment instruction;UE/LE Strength taining/ROM;Therapeutic Exercise;Patient/family education;Visual/perceptual remediation/compensation OT Self Feeding Anticipated Outcome(s): mod I OT Basic Self-Care Anticipated Outcome(s): mod I OT Toileting Anticipated Outcome(s): (S) OT Bathroom Transfers Anticipated Outcome(s): (S) OT Recommendation Recommendations for Other Services: Speech consult;Therapeutic Recreation consult Therapeutic Recreation Interventions: Outing/community reintergration Patient destination: Home Follow Up  Recommendations: Home health OT Equipment Recommended: To be determined   Skilled Therapeutic Intervention Pt received supine in bed with no c/o of pain and agreeable to therapy. Pt and wife educated re OT POC, goal setting, and CIR expectations. Pt transferred to sitting EOB with vc. Pt required min A HHA during stand pivot transfer to w/c. Vc provided re weight shift over L LE, with pt unable to fully complete during standing. Pt transferred to shower bench with min HHA. Pt educated re fall risk reduction strategies and use of AE/AD during ADLs. Pt completed UB and LB bathing from seated level with (S). Pt required steadying assist to complete standing level peri-care in shower, with vc provided for use of grab bars. Pt transferred out of shower into w/c with tactile cues required for UE placement on w/c and grab bars. Pt completed distal LB dressing from w/c level with (S). Vc provided for activity pacing. Steadying assist provided for standing level clothing management. Review of OT goals with pt and family and d/c planning completed. Visual assessment completed, with vc and education provided re scanning environment.  Pt left sitting up in w/c with quick release belt donned and all needs met.   OT Evaluation Precautions/Restrictions  Precautions Precautions: Fall Precaution Comments: L hemianopsia Restrictions Weight Bearing Restrictions: No General Chart Reviewed: Yes Family/Caregiver Present: Yes Pain Pain Assessment Pain Score: 0-No pain Home Living/Prior Functioning Home Living Family/patient expects to be discharged to:: Private residence Living Arrangements: Spouse/significant other Available Help at Discharge: Available 24 hours/day, Family Type of Home: House Home Access: Stairs to enter Technical brewer of Steps: 3-4 Entrance Stairs-Rails: None Home Layout: Two level, Bed/bath upstairs Alternate Level Stairs-Number of Steps: 14 Alternate Level Stairs-Rails:  Right Bathroom Toilet: Standard Bathroom Accessibility: Yes  Lives With: Spouse IADL History Occupation: Retired Prior Function Level of Independence: Independent with basic ADLs, Requires assistive device for independence Comments: pt uses rollator for ambulation,  assists to get into tub however pt able to complete dressing and bathing on own ADL ADL ADL Comments: See functional navigator Vision Baseline Vision/History: Wears glasses Wears Glasses: At all times Patient Visual Report: Other (comment)(L hemianopsia) Vision Assessment?: Yes Tracking/Visual Pursuits: Decreased smoothness of horizontal tracking;Decreased smoothness of vertical tracking;Requires cues, head turns, or add eye shifts to track;Unable to hold eye position out of midline Visual Fields: Left homonymous hemianopsia;Left visual field deficit Perception  Perception: Impaired Inattention/Neglect: Does not attend to left visual field;Impaired-to be further tested in functional context Praxis Praxis: Impaired Praxis Impairment Details: Motor planning Praxis-Other Comments: Mild motor planning difficulties with L UE, some overshooting observed during reaching Cognition Overall Cognitive Status: Impaired/Different from baseline Arousal/Alertness: Awake/alert Orientation Level: Person;Place;Situation Person: Oriented Place: Oriented Situation: Oriented Year: 2019 Month: March Day of Week: Correct Immediate Memory Recall: Blue Memory Recall: Bed;Sock Memory Recall Sock: With Cue Memory Recall Bed: With Cue Attention: Selective Sustained Attention: Appears intact Selective Attention: Impaired Selective Attention Impairment: Functional basic(pt easily distracted) Problem Solving: Impaired Safety/Judgment: Impaired Sensation Sensation Light Touch: Impaired by gross assessment Stereognosis: Not tested Hot/Cold: Appears Intact Proprioception: Impaired by gross assessment Coordination Gross Motor Movements  are Fluid and Coordinated: No Fine Motor Movements are Fluid and Coordinated: Yes Motor  Motor Motor: Abnormal postural alignment and control;Motor apraxia Motor - Skilled Clinical Observations: Pt with abnormal gait pattern at baseline, PT to further assess Mobility  Bed Mobility Bed Mobility: Supine to Sit Supine to Sit: 5: Supervision Supine to Sit Details: Verbal cues for safe use of DME/AE Transfers Transfers: Sit to Stand;Stand to Sit Sit to Stand: 4: Min guard Stand to Sit: 4: Min guard  Trunk/Postural Assessment  Cervical Assessment Cervical Assessment: Exceptions to [facility] Cervical AROM Overall Cervical AROM: Other (comment)(forward head flexion) Thoracic Assessment Thoracic Assessment: Within Functional Limits Lumbar Assessment Lumbar Assessment: Within Functional Limits Postural Control Postural Control: Deficits on evaluation Trunk Control: Pt with poor standing balance  Balance Balance Balance Assessed: No Extremity/Trunk Assessment RUE Assessment RUE Assessment: Exceptions to [facility] RUE Strength RUE Overall Strength Comments: Pt with strength deficits but overall functional during ADLs LUE Assessment LUE Assessment: Exceptions to [facility] LUE Strength LUE Overall Strength Comments: Pt with strength deficits but functional overall during ADLs   See Function Navigator for Current Functional Status.   Refer to Care Plan for Long Term Goals  Recommendations for other services: Therapeutic Recreation  Outing/community reintegration   Discharge Criteria: Patient will be discharged from OT if patient refuses treatment 3 consecutive times without medical reason, if treatment goals not met, if there is a change in medical status, if patient makes no progress towards goals or if patient is discharged from hospital.  The above assessment, treatment plan, treatment alternatives and goals were discussed and mutually agreed upon: by patient and by family  Curtis Sites 11/07/2017, 1:54 PM

## 2017-11-07 NOTE — Progress Notes (Signed)
[place] [facility]ICINE & REHABILITATION     PROGRESS NOTE    Subjective/Complaints: Had a good night. Asked if he could use his straight razor to shave. Denies pain ROS: Patient denies fever, rash, sore throat, blurred vision, nausea, vomiting, diarrhea, cough, shortness of breath or chest pain, joint or back pain, headache, or mood change.    Objective: Vital Signs: Blood pressure (!) 153/82, pulse 76, temperature 98.1 F (36.7 C), temperature source Oral, resp. rate 18, height 5\' 11"  (1.803 m), weight 77.9 kg (171 lb 11.8 oz), SpO2 94 %. No results found. Recent Labs    11/06/17 0816 11/07/17 0651  WBC 8.6 9.0  HGB 13.7 14.0  HCT 40.4 41.3  PLT 201 197   Recent Labs    11/06/17 0816 11/07/17 0651  NA 135 133*  K 3.7 3.7  CL 102 101  GLUCOSE 149* 111*  BUN 9 13  CREATININE 0.98 1.13  CALCIUM 9.0 8.8*   CBG (last 3)  No results for input(s): GLUCAP in the last 72 hours.  Wt Readings from Last 3 Encounters:  11/06/17 77.9 kg (171 lb 11.8 oz)  11/05/17 75.5 kg (166 lb 7.2 oz)  10/14/17 76.2 kg (168 lb)    Physical Exam:  Constitutional: No distress . Vital signs reviewed. HEENT: EOMI, oral membranes moist Cardiovascular: RRR without murmur. No JVD. Incision intact/dry blood on dressing  Respiratory: CTA Bilaterally without wheezes or rales. Normal effort    GI: BS +, non-tender, non-distended   Musculoskeletal:  No edema or tenderness UE/LE's  Neurological: He is alert and oriented to person, place, and time. Reasonable insight and awareness Right gaze preference with left inattention.  Left homonymous hemianopsia.  Mild dysarthria.   LUE: 4+/5 proximal to distal, RUE almost 5/5 RLE: 4+ to 5/5 proximal to distal LLE: 4 to 4+/5 proximal to distal LUE mild ataxic with finger to nose/past pointing  Senses pain and light touch in all 4  Skin: Skin is warm and dry. He is not diaphoretic.  Psychiatric: pleasant and appropriate    Assessment/Plan: 1.  Functional deficits  secondary to right PCA infarct which require 3+ hours per day of interdisciplinary therapy in a comprehensive inpatient rehab setting. Physiatrist is providing close team supervision and 24 hour management of active medical problems listed below. Physiatrist and rehab team continue to assess barriers to discharge/monitor patient progress toward functional and medical goals.  Function:  Bathing Bathing position      Bathing parts      Bathing assist        Upper Body Dressing/Undressing Upper body dressing                    Upper body assist        Lower Body Dressing/Undressing Lower body dressing                                  Lower body assist        Toileting Toileting          Toileting assist     Transfers Chair/bed transfer     Chair/bed transfer assist level: Touching or steadying assistance (Pt > 75%)       Locomotion Ambulation           Wheelchair          Cognition Comprehension Comprehension assist level: Follows basic conversation/direction with no assist  Expression  Expression assist level: Expresses basic needs/ideas: With no assist  Social Interaction Social Interaction assist level: Interacts appropriately with others with medication or extra time (anti-anxiety, antidepressant).  Problem Solving Problem solving assist level: Solves basic problems with no assist  Memory Memory assist level: More than reasonable amount of time   Medical Problem List and Plan: 1. 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   -beginning therapies today 2.  DVT Prophylaxis/Anticoagulation: Pharmaceutical: Lovenox 3. Pain Management: tylenol prn 4. Mood: LCSW to follow for evaluation and support.  5. Neuropsych: This patient is capable of making decisions on his own behalf. 6. Skin/Wound Care: routine pressure relief measures 7.  Fluids/Electrolytes/Nutrition: encourage PO   -I personally reviewed the patient's labs today.   8. HTN: Monitor BID. Continue  9. CAD s/p CABG: On lipitor and ASA 10 CKD stage III: SCr- 1.8. Cr 1.13 today 11. UTI: Diptheroids 70k. Received 4 days of rocephin. Continue keflex thru friday 12. Seasonal allergies v/s URI: Encourage fluid intake. Continue Claritin.    - flonase added for nasal congestion.    LOS (Days) 1 A FACE TO FACE EVALUATION WAS PERFORMED  Ranelle Oyster, MD 11/07/2017 11:40 AM

## 2017-11-08 ENCOUNTER — Inpatient Hospital Stay (HOSPITAL_COMMUNITY): Payer: Medicare Other | Admitting: Speech Pathology

## 2017-11-08 ENCOUNTER — Inpatient Hospital Stay (HOSPITAL_COMMUNITY): Payer: Medicare Other | Admitting: Physical Therapy

## 2017-11-08 ENCOUNTER — Inpatient Hospital Stay (HOSPITAL_COMMUNITY): Payer: Medicare Other | Admitting: Occupational Therapy

## 2017-11-08 NOTE — Progress Notes (Signed)
[place] PHYSICAL MEDICINE & REHABILITATION     PROGRESS NOTE    Subjective/Complaints: No new complaints. Had a good day with therapy. Denies pain.   ROS: Patient denies fever, rash, sore throat, blurred vision, nausea, vomiting, diarrhea, cough, shortness of breath or chest pain, joint or back pain, headache, or mood change.    Objective: Vital Signs: Blood pressure 120/77, pulse 77, temperature 98.6 F (37 C), temperature source Oral, resp. rate 18, height 5\' 11"  (1.803 m), weight 77.9 kg (171 lb 11.8 oz), SpO2 93 %. No results found. Recent Labs    11/06/17 0816 11/07/17 0651  WBC 8.6 9.0  HGB 13.7 14.0  HCT 40.4 41.3  PLT 201 197   Recent Labs    11/06/17 0816 11/07/17 0651  NA 135 133*  K 3.7 3.7  CL 102 101  GLUCOSE 149* 111*  BUN 9 13  CREATININE 0.98 1.13  CALCIUM 9.0 8.8*   CBG (last 3)  No results for input(s): GLUCAP in the last 72 hours.  Wt Readings from Last 3 Encounters:  11/06/17 77.9 kg (171 lb 11.8 oz)  11/05/17 75.5 kg (166 lb 7.2 oz)  10/14/17 76.2 kg (168 lb)    Physical Exam:  Constitutional: No distress . Vital signs reviewed. HEENT: EOMI, oral membranes moist Cardiovascular: RRR without murmur. No JVD    Respiratory: CTA Bilaterally without wheezes or rales. Normal effort    GI: BS +, non-tender, non-distended  Musculoskeletal:  No edema or tenderness UE/LE's  Neurological: He is alert and oriented to person, place, and time. Reasonable insight and awareness Left inattention.   Left homonymous hemianopsia.  Mild dysarthria.   LUE: 4+/5 proximal to distal, RUE almost 5/5 RLE: 4+ to 5/5 proximal to distal LLE: 4 to 4+/5 proximal to distal LUE mild ataxia with FTN Senses pain and light touch in all 4  Skin: Skin is warm and dry. He is not diaphoretic.  Psychiatric: pleasant and appropriate    Assessment/Plan: 1. Functional deficits  secondary to right PCA infarct which require 3+ hours per day of interdisciplinary therapy  in a comprehensive inpatient rehab setting. Physiatrist is providing close team supervision and 24 hour management of active medical problems listed below. Physiatrist and rehab team continue to assess barriers to discharge/monitor patient progress toward functional and medical goals.  Function:  Bathing Bathing position   Position: Shower  Bathing parts Body parts bathed by patient: Right arm, Left lower leg, Left arm, Chest, Abdomen, Front perineal area, Buttocks, Right upper leg, Left upper leg, Right lower leg Body parts bathed by helper: Back  Bathing assist Assist Level: Touching or steadying assistance(Pt > 75%)      Upper Body Dressing/Undressing Upper body dressing   What is the patient wearing?: Pull over shirt/dress     Pull over shirt/dress - Perfomed by patient: Thread/unthread right sleeve, Thread/unthread left sleeve, Put head through opening, Pull shirt over trunk          Upper body assist Assist Level: Set up      Lower Body Dressing/Undressing Lower body dressing   What is the patient wearing?: Underwear, Pants, Socks, Shoes Underwear - Performed by patient: Thread/unthread right underwear leg, Thread/unthread left underwear leg, Pull underwear up/down   Pants- Performed by patient: Thread/unthread right pants leg, Thread/unthread left pants leg, Pull pants up/down, Fasten/unfasten pants       Socks - Performed by patient: Don/doff right sock, Don/doff left sock   Shoes - Performed by patient: Don/doff right  shoe, Don/doff left shoe, Fasten right, Fasten left            Lower body assist Assist for lower body dressing: Touching or steadying assistance (Pt > 75%)      Toileting Toileting   Toileting steps completed by patient: Performs perineal hygiene, Adjust clothing prior to toileting Toileting steps completed by helper: Adjust clothing after toileting Toileting Assistive Devices: Grab bar or rail  Toileting assist Assist level: Touching or  steadying assistance (Pt.75%)   Transfers Chair/bed transfer   Chair/bed transfer method: Stand pivot Chair/bed transfer assist level: Touching or steadying assistance (Pt > 75%) Chair/bed transfer assistive device: Patent attorney     Max distance: 196ft Assist level: Touching or steadying assistance (Pt > 75%)   Wheelchair   Type: Manual Max wheelchair distance: 1109ft  Assist Level: Touching or steadying assistance (Pt > 75%)  Cognition Comprehension Comprehension assist level: Set up assist with hearing device, Understands basic 90% of the time/cues < 10% of the time  Expression Expression assist level: Expresses basic needs/ideas: With no assist  Social Interaction Social Interaction assist level: Interacts appropriately with others with medication or extra time (anti-anxiety, antidepressant).  Problem Solving Problem solving assist level: Solves basic 75 - 89% of the time/requires cueing 10 - 24% of the time  Memory Memory assist level: Recognizes or recalls 75 - 89% of the time/requires cueing 10 - 24% of the time   Medical Problem List and Plan: 1. 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   -continue therapies 2.  DVT Prophylaxis/Anticoagulation: Pharmaceutical: Lovenox 3. Pain Management: tylenol prn 4. Mood: LCSW to follow for evaluation and support.  5. Neuropsych: This patient is capable of making decisions on his own behalf. 6. Skin/Wound Care: routine pressure relief measures 7. Fluids/Electrolytes/Nutrition: intake is solid   8. HTN: Monitor BID. Continue  9. CAD s/p CABG: On lipitor and ASA 10 CKD stage III: SCr- 1.8. Cr 1.13 3/28 11. UTI: Diptheroids 70k. Received 4 days of rocephin. Continue keflex thru today 12. Seasonal allergies v/s URI: Encourage fluid intake. Continue Claritin.    - flonase added for nasal congestion with improvement.    LOS (Days)  2 A FACE TO FACE EVALUATION WAS PERFORMED  Ranelle Oyster, MD 11/08/2017 8:32 AM

## 2017-11-08 NOTE — Plan of Care (Signed)
  Problem: RH BLADDER ELIMINATION Goal: RH STG MANAGE BLADDER WITH ASSISTANCE Description STG Manage Bladder With Assistance. Mod I  Spills urinal requiring max assistance. Outcome: Not Progressing   Problem: RH SAFETY Goal: RH STG ADHERE TO SAFETY PRECAUTIONS W/ASSISTANCE/DEVICE Description STG Adhere to Safety Precautions With Assistance/Device. Min  Attempts up without assistance. Outcome: Not Progressing

## 2017-11-08 NOTE — Progress Notes (Signed)
Speech Language Pathology Daily Session Notes  Patient Details  Name: Thomas [name]. MRN: [id number] Date of Birth: 11-Oct-1940  Today's Date: 11/08/2017  Session 1: SLP Individual Time: 1300-1330 SLP Individual Time Calculation (min): 30 min   Session 2: SLP Individual Time: 1400-1430 SLP Individual Time Calculation (min): 30 min   Short Term Goals: Week 1: SLP Short Term Goal 1 (Week 1): Pt will utilize external memory aids to recall dialy information with supervision cues.  SLP Short Term Goal 2 (Week 1): Pt will scan to left of midline with Min A cues to locate items in 75% of opportunities.  SLP Short Term Goal 3 (Week 1): Pt will demonstrate anticipatory awareness by listing safe vs. unsafe activities to participate in at home with Min A cues.  SLP Short Term Goal 4 (Week 1): Pt will demonstrate selective attention for ~ 45 minutes in moderately distracting environment with supervision cues.  SLP Short Term Goal 5 (Week 1): Pt will self-monitor and self-correct errors with Min A cues.   Skilled Therapeutic Interventions:   Session 1: Skilled treatment session focused on cognitive goals. SLP facilitated session by providing Supervision verbal cues for problem solving during a basic money management task. Pt required Mod A verbal cues to attend to left field of environment and to self-monitor and correct errors during the task. Pt demonstrated selective attention for ~30 minutes in a mildly distracting environment with Supervision verbal cues for redirection. Pt handed off to OT. Continue with current plan of care.   Session 2: Skilled treatment session focused on cognitive goals. SLP facilitated session by providing Max A verbal cues for problem solving during a basic money management task. Pt demonstrated decreased problem solving compared to last session, suspect due to mental fatigue. Pt required Min A verbal cues to attend to left field of environment and Max A verbal cues to  self-monitor and correct errors during the task. Pt left upright in recliner with alarm on, quick release belt in place, and wife present. Continue with current plan of care.      Function:  Cognition Comprehension Comprehension assist level: Follows basic conversation/direction with extra time/assistive device  Expression   Expression assist level: Expresses basic needs/ideas: With extra time/assistive device  Social Interaction Social Interaction assist level: Interacts appropriately 90% of the time - Needs monitoring or encouragement for participation or interaction.  Problem Solving Problem solving assist level: Solves basic 50 - 74% of the time/requires cueing 25 - 49% of the time  Memory Memory assist level: Recognizes or recalls 75 - 89% of the time/requires cueing 10 - 24% of the time    Pain Pain Assessment Pain Scale: 0-10 Pain Score: 0-No pain  Therapy/Group: Individual Therapy  Roxan Hockey  SLP - Student 11/08/2017, 3:07 PM

## 2017-11-08 NOTE — Progress Notes (Signed)
Physical Therapy Session Note  Patient Details  Name: Thomas [name]. MRN: [id number] Date of Birth: Dec 30, 1940  Today's Date: 11/08/2017 PT Individual Time: 1100-1159 PT Individual Time Calculation (min): 59 min   Short Term Goals: Week 1:  PT Short Term Goal 1 (Week 1): STG=LTG due to ELOS  Skilled Therapeutic Interventions/Progress Updates:   Pt received in recliner, taking a nap. Pt easily aroused and agreeable therapy. Pt denies pain. Pt transferred recliner>w/c with RW and min A, requiring verbal cuing for sequencing and technique. Therapist propelled pt in w/c to gym for energy conservation. Pt transferred w/c<>mat with RW and min A, demonstrating posteroir LOB 2/2 decreased anterior weight shifting and increased weight bearing R>L. Pt performed sit<>stand w/o use of UE or RW for LE strengthening, requiring verbal cuing for anterior weight shift and hand placement. Pt performed sit<>stand x6 with min A, requiring a rest break and then x4, min A progressing to mod A as pt fatigued. In standing, pt attempted toe taps to target on ground to facilitate weight shifting with mod A. Pt demonstrated decreased weight shifting, decreased strength in LE, and posterior LOB, therapist able to correct with mod assist. Pt only able to complete a few before requiring a break. Pt performed ball toss while standing to improve L inattention and static standing balance. Pt performed card matching game in standing with min A for balance, with forced use of LLE due to R leg being elevated on 3" step, requiring verbal and auditory cuing to find correct card placement. Therapist propelled pt in w/c back to room, transferred w/c>recliner with min A and RW, with verbal cuing for sequencing. Pt left seated in walker, chair alarm on, quick release belt on, and RN in room administering medication.   Therapy Documentation Precautions:  Precautions Precautions: Fall Precaution Comments: L  hemianopsia Restrictions Weight Bearing Restrictions: No  See Function Navigator for Current Functional Status.   Therapy/Group: Individual Therapy  Tawanna Solo 11/08/2017, 4:44 PM

## 2017-11-08 NOTE — Plan of Care (Signed)
  Problem: RH BLADDER ELIMINATION Goal: RH STG MANAGE BLADDER WITH ASSISTANCE Description STG Manage Bladder With Assistance. Mod I  11/08/2017 1702 by Ronnette Juniper, LPN Outcome: Progressing 11/08/2017 1700 by Ronnette Juniper, LPN Outcome: Progressing   Problem: RH SAFETY Goal: RH STG ADHERE TO SAFETY PRECAUTIONS W/ASSISTANCE/DEVICE Description STG Adhere to Safety Precautions With Assistance/Device. Min  11/08/2017 1702 by Ronnette Juniper, LPN Outcome: Progressing 11/08/2017 1700 by Ronnette Juniper, LPN Outcome: Progressing   Problem: RH Vision Goal: RH LTG Vision (Specify) 11/08/2017 1702 by Ronnette Juniper, LPN Outcome: Progressing 11/08/2017 1700 by Ronnette Juniper, LPN Outcome: Progressing

## 2017-11-08 NOTE — Progress Notes (Signed)
Occupational Therapy Session Note  Patient Details  Name: Thomas [name]. MRN: [id number] Date of Birth: [birth year]  Today's Date: 11/08/2017 OT Individual Time: 4949-4473 and 1331-1400 OT Individual Time Calculation (min): 59 min and 29 min  Skilled Therapeutic Interventions/Progress Updates:    Pt greeted in recliner with spouse present. No c/o pain and ADL needs met. Worked on cognitive remediation, dynamic balance, and visual scanning during bedmaking. Pt requiring mod-max vcs for sequencing, problem solving, and self organizing when stripping bed and reapplying linen. While standing to complete these tasks, tactile/manual cues provided to Lt knee due to hyperextension. Max cues for DME safety required throughout tx as pt ambulated around room with Mod A using RW. He required several rest breaks and extra time/cues to scan for necessary items, especially items on Lt. At end of session pt was left in recliner with chair alarm set and spouse present.    2nd Session 1:1 tx (29 min) Pt greeted via SLP handoff. Tx focus on visual scanning, attention, and following 1 step instruction for carryover of cognitive skills during ADL tasks. Pt requiring mod vcs for accurate scanning for number sequence and calendar handouts. Pt exhibited greater difficultly tracking completely to left side. Environmental modifications created to increase ease. At end of tx pt was left via SLP handoff.   Therapy Documentation Precautions:  Precautions Precautions: Fall Precaution Comments: L hemianopsia Restrictions Weight Bearing Restrictions: No   Pain: No c/o pain during session    ADL: ADL ADL Comments: See functional navigator     See Function Navigator for Current Functional Status.   Therapy/Group: Individual Therapy  [name] [name] 11/08/2017, 12:45 PM

## 2017-11-08 NOTE — Progress Notes (Signed)
Social Work  Social Work Assessment and Plan  Patient Details  Name: Thomas [name]. MRN: [id number] Date of Birth: [birth year]  Today's Date: 11/08/2017  Problem List:  Patient Active Problem List   Diagnosis Date Noted  . 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 Benign essential HTN   . Coronary artery disease involving native coronary artery of native heart without angina pectoris   . Stage 3 chronic kidney disease (HCC)   . Acute lower UTI   . Seasonal allergies   . Stroke (HCC) 11/04/2017  . HLD (hyperlipidemia) 11/04/2017  . Chronic diastolic CHF (congestive heart failure) (HCC) 11/04/2017  . Acute renal failure superimposed on stage 2 chronic kidney disease (HCC) 11/04/2017  . Acute metabolic encephalopathy 11/04/2017  . UTI (urinary tract infection) 11/04/2017  . AKI (acute kidney injury) (HCC)   . History of CVA with residual deficit   . Prediabetes   . Left hemiparesis (HCC) 03/19/2013  . Cerebral infarction (HCC) 03/19/2013  . Left leg weakness 03/19/2013  . CAD, ARTERY BYPASS GRAFT 09/30/2008  . 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 Borderline hyperglycemia 12/03/2007  . Other and unspecified hyperlipidemia 07/28/2007  . Essential hypertension 07/28/2007  . 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 Coronary atherosclerosis 07/28/2007  . BENIGN PROSTATIC HYPERTROPHY 07/28/2007  . COLONIC POLYPS, 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   Past Medical History:  Past Medical History:  Diagnosis Date  . CHF (congestive heart failure) (HCC) 1999  . CKD (chronic kidney disease), stage II   . Coronary artery disease   . Hx of colonic polyps   . Hyperlipidemia   . Hyperplastic colon polyp 04/15/2007  . Hypertension   . Prostatic hypertrophy, benign    with elevated PSA  . TIA (transient ischemic attack) 2003   "mini stroke" (03/19/2013)   Past Surgical History:  Past Surgical History:  Procedure Laterality Date  . CARDIAC CATHETERIZATION    . CATARACT EXTRACTION W/ INTRAOCULAR  LENS  IMPLANT, BILATERAL Bilateral 1998  . COLONOSCOPY  2008  . CORONARY ARTERY BYPASS GRAFT  1999   x4  . LOOP RECORDER INSERTION N/A 11/05/2017   Procedure: LOOP RECORDER INSERTION;  Surgeon: Hillis Range, MD;  Location: [facility] INVASIVE CV LAB;  Service: Cardiovascular;  Laterality: N/A;  . TEE WITHOUT CARDIOVERSION N/A 11/05/2017   Procedure: TRANSESOPHAGEAL ECHOCARDIOGRAM (TEE) WITH LOOP;  Surgeon: Lars Masson, MD;  Location: [facility] ENDOSCOPY;  Service: Cardiovascular;  Laterality: N/A;   Social History:  reports that he quit smoking about 4 years ago. His smoking use included cigarettes. He has a 37.50 pack-year smoking history. He has never used smokeless tobacco. He reports that he drinks about 0.6 oz of alcohol per week. He reports that he does not use drugs.  Family / Support Systems Marital Status: Married How Long?: 27 yrs (2nd marriage for pt) Patient Roles: Spouse, Parent Spouse/Significant Other: wife, Thomas [name] @ (H) [phone] or (C) [phone] Children: son, Thomas [name] living in [place] and pt has a daughter living out of state Anticipated Caregiver: Spouse Ability/Limitations of Caregiver: None Caregiver Availability: 24/7 Family Dynamics: Pt describes wife as very supportive and denies any concerns about support at home.  Social History Preferred language: English Religion: Methodist Cultural Background: NA Read: Yes Write: Yes Employment Status: Retired Fish farm manager Issues: None Guardian/Conservator: None - per MD, pt is capable of making decisions on his own behalf.   Abuse/Neglect Abuse/Neglect Assessment Can Be Completed: Yes Physical Abuse: Denies Verbal Abuse: Denies Sexual Abuse: Denies Exploitation of  patient/patient's resources: Denies Self-Neglect: Denies  Emotional Status Pt's affect, behavior adn adjustment status: Pt very pleasant and completes the assessment interview without difficulty.  He denies any emotional distress  and is optimistic about his stroke recovery.  Will monitor and refer for neuropsychology as indicated. Recent Psychosocial Issues: none Pyschiatric History: none Substance Abuse History: none  Patient / Family Perceptions, Expectations & Goals Pt/Family understanding of illness & functional limitations: Pt has a good understanding of his stroke and current functional limitations.  Notes that he went to OP rehab after prior CVA so is familiar with rehab. Premorbid pt/family roles/activities: Mostly independent in the home.  Wife drives.  Pt used stair lift and wife provided min assist with ADLs. Anticipated changes in roles/activities/participation: Little change anticipated if pt able to reach a supervision goals. Pt/family expectations/goals: "I hope I can get home pretty quick."  [employer]: None Premorbid Home Care/DME Agencies: Other (Comment)(did op rehab at [facility] location) Transportation available at discharge: yes Resource referrals recommended: Neuropsychology, Support group (specify)  Discharge Planning Living Arrangements: Spouse/significant other Support Systems: Spouse/significant other, Children, Friends/neighbors Type of Residence: Private residence Insurance Resources: [employer] Financial Resources: Social Security Financial Screen Referred: No Living Expenses: Own Money Management: Spouse Does the patient have any problems obtaining your medications?: No Home Management: mostly wife Patient/Family Preliminary Plans: Pt to d/c home with wife who can provide 24/7 support Social Work Anticipated Follow Up Needs: HH/OP Expected length of stay: 6-9 days   Clinical Impression Very pleasant gentleman here following a stroke.  H/o prior CVA in 2014 and attended OP therapies.  Short LOS anticipated and supervision goals.  Wife able to provide this support.  Pt denies any significant emotional distress.  SW to follow for support and d/c  planning.  Thomas [name] 11/08/2017, 11:27 AM

## 2017-11-08 NOTE — IPOC Note (Signed)
Overall Plan of Care [facility]) Patient Details Name: Thomas [name]. MRN: [id number] DOB: 06-04-1941  Admitting Diagnosis: <principal problem not specified>pca infarct  Hospital Problems: Active Problems:   Acute right PCA stroke [facility])     Functional Problem List: Nursing Bladder, Endurance, Medication Management, Motor, Perception, Safety  PT Balance, Behavior, Endurance, Motor, Perception, Safety, Sensory, Skin Integrity  OT Balance, Safety, Sensory, Endurance, Motor, Perception  SLP Cognition, Perception, Safety  TR         Basic ADL's: OT Grooming, Bathing, Dressing, Toileting     Advanced  ADL's: OT       Transfers: PT Bed Mobility, Bed to Chair, Car, Furniture, Civil Service fast streamer, Research scientist (life sciences): PT Ambulation, Psychologist, prison and probation services, Stairs     Additional Impairments: OT    SLP Social Cognition   Problem Solving, Memory, Attention, Awareness  TR      Anticipated Outcomes Item Anticipated Outcome  Self Feeding mod I  Swallowing      Basic self-care  mod I  Toileting  (S)   Bathroom Transfers (S)  Bowel/Bladder  remain continent of bowel and regular evacuation; regain bladder continence  Transfers     Locomotion     Communication     Cognition  Min A to Supervision  Pain  no pain or less than 2  Safety/Judgment  Remain free of falls, infection and skin breakdown   Therapy Plan: PT Intensity: Minimum of 1-2 x/day ,45 to 90 minutes PT Frequency: 5 out of 7 days PT Duration Estimated Length of Stay: 7-10 days  OT Intensity: Minimum of 1-2 x/day, 45 to 90 minutes OT Frequency: 5 out of 7 days OT Duration/Estimated Length of Stay: 5-7 days SLP Intensity: Minumum of 1-2 x/day, 30 to 90 minutes SLP Frequency: 3 to 5 out of 7 days SLP Duration/Estimated Length of Stay: 7 days    Team Interventions: Nursing Interventions Patient/Family Education, Bladder Management, Medication Management, Psychosocial Support  PT interventions  Ambulation/gait training, Warden/ranger, Cognitive remediation/compensation, Community reintegration, Discharge planning, Disease management/prevention, DME/adaptive equipment instruction, Functional electrical stimulation, Functional mobility training, Patient/family education, Psychosocial support, Pain management, Neuromuscular re-education, Skin care/wound management, Splinting/orthotics, Therapeutic Exercise, Therapeutic Activities, Stair training, UE/LE Strength taining/ROM, Visual/perceptual remediation/compensation, UE/LE Coordination activities, Wheelchair propulsion/positioning  OT Interventions Warden/ranger, Discharge planning, Self Care/advanced ADL retraining, Therapeutic Activities, UE/LE Coordination activities, DME/adaptive equipment instruction, UE/LE Strength taining/ROM, Therapeutic Exercise, Patient/family education, Visual/perceptual remediation/compensation  SLP Interventions Cognitive remediation/compensation, Functional tasks, Patient/family education, Therapeutic Activities, Internal/external aids  TR Interventions    SW/CM Interventions Discharge Planning, Psychosocial Support, Patient/Family Education   Barriers to Discharge MD  Medical stability  Nursing      PT Inaccessible home environment, Decreased caregiver support, Home environment access/layout, Behavior    OT      SLP (Previous CVA)    SW       Team Discharge Planning: Destination: PT-Home ,OT- Home , SLP-Home Projected Follow-up: PT-Home health PT, OT-  Home health OT, SLP-24 hour supervision/assistance, Home Health SLP Projected Equipment Needs: PT-To be determined, OT- To be determined, SLP-None recommended by SLP Equipment Details: PT-Pt has RW and Rollator, OT-  Patient/family involved in discharge planning: PT- Patient,  OT-Patient, Family member/caregiver, SLP-Patient  MD ELOS: 7-10 days Medical Rehab Prognosis:  Excellent Assessment: The patient has been admitted for  CIR therapies with the diagnosis of right PCA infarct. The team will be addressing functional mobility, strength, stamina, balance, safety, adaptive techniques and equipment, self-care, bowel and  bladder mgt, patient and caregiver education, visual-spatial awareness, cognition, communication, community reentry. Goals have been set at supervision to mod I with self-care and mobility and supervision to min assist with cognition.    Ranelle Oyster, MD, FAAPMR      See Team Conference Notes for weekly updates to the plan of care

## 2017-11-08 NOTE — Progress Notes (Signed)
Restless night, without specific complaint. Initially requested claritin, informed patient he had claritin at 0800. Patient wanted to take  PRN  Benadryl 12.5mg  for allergies, med given at 2205. Used urinal X 3 without spills. Thomas [name] A

## 2017-11-09 ENCOUNTER — Inpatient Hospital Stay (HOSPITAL_COMMUNITY): Payer: Medicare Other | Admitting: Physical Therapy

## 2017-11-09 ENCOUNTER — Inpatient Hospital Stay (HOSPITAL_COMMUNITY): Payer: Medicare Other | Admitting: Occupational Therapy

## 2017-11-09 ENCOUNTER — Inpatient Hospital Stay (HOSPITAL_COMMUNITY): Payer: Medicare Other | Admitting: Speech Pathology

## 2017-11-09 DIAGNOSIS — H53462 Homonymous bilateral field defects, left side: Secondary | ICD-10-CM

## 2017-11-09 DIAGNOSIS — R269 Unspecified abnormalities of gait and mobility: Secondary | ICD-10-CM

## 2017-11-09 DIAGNOSIS — I10 Essential (primary) hypertension: Secondary | ICD-10-CM

## 2017-11-09 DIAGNOSIS — I69398 Other sequelae of cerebral infarction: Secondary | ICD-10-CM

## 2017-11-09 LAB — CULTURE, BLOOD (ROUTINE X 2)
Culture: NO GROWTH
Culture: NO GROWTH
SPECIAL REQUESTS: ADEQUATE
Special Requests: ADEQUATE

## 2017-11-09 NOTE — Progress Notes (Signed)
Speech Language Pathology Daily Session Note  Patient Details  Name: Thomas [name]. MRN: [id number] Date of Birth: 1941-05-22  Today's Date: 11/09/2017 SLP Individual Time: 1020-1100 SLP Individual Time Calculation (min): 40 min  Short Term Goals: Week 1: SLP Short Term Goal 1 (Week 1): Pt will utilize external memory aids to recall dialy information with supervision cues.  SLP Short Term Goal 2 (Week 1): Pt will scan to left of midline with Min A cues to locate items in 75% of opportunities.  SLP Short Term Goal 3 (Week 1): Pt will demonstrate anticipatory awareness by listing safe vs. unsafe activities to participate in at home with Min A cues.  SLP Short Term Goal 4 (Week 1): Pt will demonstrate selective attention for ~ 45 minutes in moderately distracting environment with supervision cues.  SLP Short Term Goal 5 (Week 1): Pt will self-monitor and self-correct errors with Min A cues.   Skilled Therapeutic Interventions:  Pt was seen for skilled ST targeting cognitive goals.  Pt needed max  assist verbal, visual and auditory cues for attention to the left while propelling wheelchair to avoid hitting obstacles. SLP facilitated the session with a basic calendar task to address attention to task and visual scanning.  Pt needed mod-max assist verbal cues to place number cards in order starting on the left side on a large print calendar.  As pt crossed midline to right visual field, therapist was able to fade cues to supervision for card placement.  Pt selectively attended to task for its duration (~20 minutes) with min verbal cues for redirection.  Pt was returned to room and left in recliner with quick release belt donned and call bell within reach.  Wife at bedside.    Function:  Eating Eating                 Cognition Comprehension Comprehension assist level: Follows basic conversation/direction with extra time/assistive device  Expression   Expression assist level: Expresses  basic needs/ideas: With extra time/assistive device  Social Interaction Social Interaction assist level: Interacts appropriately 90% of the time - Needs monitoring or encouragement for participation or interaction.  Problem Solving Problem solving assist level: Solves basic 50 - 74% of the time/requires cueing 25 - 49% of the time  Memory Memory assist level: Recognizes or recalls 50 - 74% of the time/requires cueing 25 - 49% of the time    Pain Pain Assessment Pain Scale: 0-10 Pain Score: 0-No pain  Therapy/Group: Individual Therapy  Neithan Day, Melanee Spry 11/09/2017, 11:04 AM

## 2017-11-09 NOTE — Progress Notes (Signed)
[facility] PHYSICAL MEDICINE & REHABILITATION     PROGRESS NOTE    Subjective/Complaints: No issues overnight, needs cues to look toward the left  ROS: Patient denies fever, rash, sore throat, blurred vision, nausea, vomiting, diarrhea, cough, shortness of breath or chest pain, joint or back pain, headache, or mood change.    Objective: Vital Signs: Blood pressure 119/70, pulse 75, temperature 98.7 F (37.1 C), temperature source Oral, resp. rate 18, height 5\' 11"  (1.803 m), weight 77.9 kg (171 lb 11.8 oz), SpO2 98 %. No results found. Recent Labs    11/07/17 0651  WBC 9.0  HGB 14.0  HCT 41.3  PLT 197   Recent Labs    11/07/17 0651  NA 133*  K 3.7  CL 101  GLUCOSE 111*  BUN 13  CREATININE 1.13  CALCIUM 8.8*   CBG (last 3)  No results for input(s): GLUCAP in the last 72 hours.  Wt Readings from Last 3 Encounters:  11/06/17 77.9 kg (171 lb 11.8 oz)  11/05/17 75.5 kg (166 lb 7.2 oz)  10/14/17 76.2 kg (168 lb)    Physical Exam:  Constitutional: No distress . Vital signs reviewed. HEENT: EOMI, oral membranes moist Cardiovascular: RRR without murmur. No JVD    Respiratory: CTA Bilaterally without wheezes or rales. Normal effort    GI: BS +, non-tender, non-distended  Musculoskeletal:  No edema or tenderness UE/LE's  Neurological: He is alert and oriented to person, place, and time. Reasonable insight and awareness Left inattention.   Left homonymous hemianopsia.  Mild dysarthria.   LUE: 4+/5 proximal to distal, RUE almost 5/5 RLE: 4+ to 5/5 proximal to distal LLE: 4 to 4+/5 proximal to distal LUE mild ataxia with FTN Senses pain and light touch in all 4  Skin: Skin is warm and dry. He is not diaphoretic.  Psychiatric: pleasant and appropriate    Assessment/Plan: 1. Functional deficits  secondary to right PCA infarct which require 3+ hours per day of interdisciplinary therapy in a comprehensive inpatient rehab setting. Physiatrist is providing close team  supervision and 24 hour management of active medical problems listed below. Physiatrist and rehab team continue to assess barriers to discharge/monitor patient progress toward functional and medical goals.  Function:  Bathing Bathing position   Position: Shower  Bathing parts Body parts bathed by patient: Right arm, Left lower leg, Left arm, Chest, Abdomen, Front perineal area, Buttocks, Right upper leg, Left upper leg, Right lower leg Body parts bathed by helper: Back  Bathing assist Assist Level: Touching or steadying assistance(Pt > 75%)      Upper Body Dressing/Undressing Upper body dressing   What is the patient wearing?: Pull over shirt/dress     Pull over shirt/dress - Perfomed by patient: Thread/unthread right sleeve, Thread/unthread left sleeve, Put head through opening, Pull shirt over trunk          Upper body assist Assist Level: Set up      Lower Body Dressing/Undressing Lower body dressing   What is the patient wearing?: Underwear, Pants, Socks, Shoes Underwear - Performed by patient: Thread/unthread right underwear leg, Thread/unthread left underwear leg, Pull underwear up/down   Pants- Performed by patient: Thread/unthread right pants leg, Thread/unthread left pants leg, Pull pants up/down, Fasten/unfasten pants       Socks - Performed by patient: Don/doff right sock, Don/doff left sock   Shoes - Performed by patient: Don/doff right shoe, Don/doff left shoe, Fasten right, Fasten left  Lower body assist Assist for lower body dressing: Touching or steadying assistance (Pt > 75%)      Toileting Toileting   Toileting steps completed by patient: Performs perineal hygiene, Adjust clothing prior to toileting, Adjust clothing after toileting Toileting steps completed by helper: Adjust clothing after toileting Toileting Assistive Devices: Grab bar or rail  Toileting assist Assist level: Touching or steadying assistance (Pt.75%)    Transfers Chair/bed transfer   Chair/bed transfer method: Stand pivot Chair/bed transfer assist level: Touching or steadying assistance (Pt > 75%) Chair/bed transfer assistive device: Patent attorney     Max distance: 134ft Assist level: Touching or steadying assistance (Pt > 75%)   Wheelchair   Type: Manual Max wheelchair distance: 122ft  Assist Level: Touching or steadying assistance (Pt > 75%)  Cognition Comprehension Comprehension assist level: Follows basic conversation/direction with extra time/assistive device  Expression Expression assist level: Expresses basic needs/ideas: With extra time/assistive device  Social Interaction Social Interaction assist level: Interacts appropriately 90% of the time - Needs monitoring or encouragement for participation or interaction.  Problem Solving Problem solving assist level: Solves basic 50 - 74% of the time/requires cueing 25 - 49% of the time  Memory Memory assist level: Recognizes or recalls 50 - 74% of the time/requires cueing 25 - 49% of the time   Medical Problem List and Plan: 1. 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   -continue therapies, PT OT speech 2.  DVT Prophylaxis/Anticoagulation: Pharmaceutical: Lovenox 3. Pain Management: tylenol prn 4. Mood: LCSW to follow for evaluation and support.  5. Neuropsych: This patient is capable of making decisions on his own behalf. 6. Skin/Wound Care: routine pressure relief measures 7. Fluids/Electrolytes/Nutrition: intake is solid   8. HTN: Monitor BID. Continue  Vitals:   11/08/17 1446 11/09/17 0500  BP: 110/65 119/70  Pulse: 68 75  Resp: 18 18  Temp: 97.6 F (36.4 C) 98.7 F (37.1 C)  SpO2: 100% 98%  Controlled 11/09/2017 9. CAD s/p CABG: On lipitor and ASA, no chest pain 10 CKD stage III: SCr- 1.8. Cr 1.13 3/28 11. UTI: Diptheroids 70k. Received 4 days of rocephin.  Continue keflex thru today 12. Seasonal allergies v/s URI: Encourage fluid intake. Continue Claritin.    - flonase added for nasal congestion with improvement.    LOS (Days) 3 A FACE TO FACE EVALUATION WAS PERFORMED  Erick Colace, MD 11/09/2017 11:34 AM

## 2017-11-09 NOTE — Progress Notes (Signed)
Physical Therapy Session Note  Patient Details  Name: Thomas [name]. MRN: [id number] Date of Birth: 1940/09/16  Today's Date: 11/09/2017 PT Individual Time: 1350-1500 PT Individual Time Calculation (min): 70 min   Short Term Goals: Week 1:  PT Short Term Goal 1 (Week 1): STG=LTG due to ELOS  Skilled Therapeutic Interventions/Progress Updates: Pt presented in recliner with wife present agreeable to therapy. Pt performed stand pivot transfer to w/c min guard and propelled w/c approx 162f with verbal cues for L scanning/attention. Pt transferred to mat stand pivot and performed sit to/from stands x 6 no AD with multimodal cues for increasing anterior wt shifting due to heavy posterior lean. Pt performed static standing placing clothes pins on basket ball net using LUE and placing on L. Performed standing on red wedge for HC stretch 1 min x 3. Participated in game of horseshoes for L NMR and forced scanning to L, pt with improved scanning with activity. Ambulated from gym to day room with RW and minA. Pt required moderate cues for obstacle negotiation as pt tends to veer R. As pt became distracted pt with decreased safety with RW requiring mod/max cues from PTA for safety. Pt also requiring cues for direction as when pt instructed to "turn left" pt continued to walk straight. In day room pt participated in NuStepL3 x 10 min for reciprocal activity and endurance. Pt returned to room in w/c at end of session and performed stand pivot to recliner. Pt left in chair at end of session with QRB placed, chair alarm on, and needs met.      Therapy Documentation Precautions:  Precautions Precautions: Fall Precaution Comments: L hemianopsia Restrictions Weight Bearing Restrictions: No   See Function Navigator for Current Functional Status.   Therapy/Group: Individual Therapy  Rameen Gohlke  Hamsini Verrilli, PTA  11/09/2017, 4:00 PM

## 2017-11-09 NOTE — Progress Notes (Signed)
Occupational Therapy Session Note  Patient Details  Name: Emma Dellaporta. MRN: [id number] Date of Birth: 15-Nov-1940  Today's Date: 11/09/2017 OT Individual Time: 8546-2703 OT Individual Time Calculation (min): 70 min   Skilled Therapeutic Interventions/Progress Updates:    Pt greeted on toilet with RN staff present. Tx focus on functional transfers, DME safety, visual scanning, and balance during bathing, dressing, grooming, and oral care tasks. Functional transfers completed with steady assist using RW at ambulatory level. He required sequencing/safety cues in shower at sit<stand level. Similar cues required for dressing completion EOB, with max cues for RW mgt. He required Min A for footwear due to long toenails/low frustration tolerance. Tended to yell at spouse for assist and required calming cues/redirection to proceed with task himself. Grooming, oral care, shaving completed w/c level with focus on scanning to Lt for needed items. At end of tx pt was left in w/c with safety belt fastened and spouse present, anticipating next therapist.    Therapy Documentation Precautions:  Precautions Precautions: Fall Precaution Comments: L hemianopsia Restrictions Weight Bearing Restrictions: No Pain: Pain Assessment Pain Scale: 0-10 Pain Score: 0-No pain ADL: ADL ADL Comments: See functional navigator     See Function Navigator for Current Functional Status.   Therapy/Group: Individual Therapy  Yanni Ruberg A Laurielle Selmon 11/09/2017, 12:53 PM

## 2017-11-09 NOTE — Plan of Care (Signed)
  Problem: RH BLADDER ELIMINATION Goal: RH STG MANAGE BLADDER WITH ASSISTANCE Description STG Manage Bladder With Assistance. Mod I  Outcome: Progressing   Problem: RH SAFETY Goal: RH STG ADHERE TO SAFETY PRECAUTIONS W/ASSISTANCE/DEVICE Description STG Adhere to Safety Precautions With Assistance/Device. Min  Outcome: Progressing   Problem: RH Vision Goal: RH LTG Vision (Specify) Outcome: Progressing

## 2017-11-10 ENCOUNTER — Inpatient Hospital Stay (HOSPITAL_COMMUNITY): Payer: Medicare Other | Admitting: Occupational Therapy

## 2017-11-10 NOTE — Progress Notes (Signed)
Occupational Therapy Session Note  Patient Details  Name: Thomas [name]. MRN: [id number] Date of Birth: [birth year]  Today's Date: 11/10/2017 OT Individual Time: 1102-1200 OT Individual Time Calculation (min): 58 min   Skilled Therapeutic Interventions/Progress Updates:    Pt greeted in recliner. ADL needs met. Tx focus on Lt scanning, balance, cognition, and standing endurance during meaningful IADL engagement. Min A sit<stand with RW to work on word-find puzzles at elevated table. Provided tactile cues to Lt knee due to hyperextension tendencies. Pt requiring min vcs to scan to Lt to find 6 words, however, when task was upgraded to larger puzzle with smaller print, he required mod vcs and tactile cues for maintaining alertness (near end of session). Longest standing window without rest 5 minutes. Cues required for increasing eccentric control while descending due to his tendency to "plop" when he's tired. At end of tx he assisted therapist with his lunch setup. He was left with all needs within reach, safety belt fastened, and chair alarm set.   Therapy Documentation Precautions:  Precautions Precautions: Fall Precaution Comments: L hemianopsia Restrictions Weight Bearing Restrictions: No Pain: No c/o pain during session    ADL: ADL ADL Comments: See functional navigator     See Function Navigator for Current Functional Status.   Therapy/Group: Individual Therapy  [name] A Esker Dever 11/10/2017, 12:46 PM

## 2017-11-10 NOTE — Progress Notes (Signed)
[name] PHYSICAL MEDICINE & REHABILITATION     PROGRESS NOTE    Subjective/Complaints: Slept okay last night.  Wife is in the room.  No new issues noted.  ROS: Patient denies fever, rash, sore throat, blurred vision, nausea, vomiting, diarrhea, cough, shortness of breath or chest pain, joint or back pain, headache, or mood change.    Objective: Vital Signs: Blood pressure 127/89, pulse 82, temperature 99 F (37.2 C), temperature source Oral, resp. rate 17, height 5\' 11"  (1.803 m), weight 77.9 kg (171 lb 11.8 oz), SpO2 100 %. No results found. No results for input(s): WBC, HGB, HCT, PLT in the last 72 hours. No results for input(s): NA, K, CL, GLUCOSE, BUN, CREATININE, CALCIUM in the last 72 hours.  Invalid input(s): CO CBG (last 3)  No results for input(s): GLUCAP in the last 72 hours.  Wt Readings from Last 3 Encounters:  11/06/17 77.9 kg (171 lb 11.8 oz)  11/05/17 75.5 kg (166 lb 7.2 oz)  10/14/17 76.2 kg (168 lb)    Physical Exam:  Constitutional: No distress . Vital signs reviewed. HEENT: EOMI, oral membranes moist Cardiovascular: RRR without murmur. No JVD    Respiratory: CTA Bilaterally without wheezes or rales. Normal effort    GI: BS +, non-tender, non-distended  Musculoskeletal:  No edema or tenderness UE/LE's  Neurological: He is alert and oriented to person, place, and time. Reasonable insight and awareness Left inattention.   Left homonymous hemianopsia.  Mild dysarthria.   LUE: 4+/5 proximal to distal, RUE almost 5/5 RLE: 4+ to 5/5 proximal to distal LLE: 4 to 4+/5 proximal to distal LUE mild ataxia with FTN Senses pain and light touch in all 4  Skin: Skin is warm and dry. He is not diaphoretic.  Psychiatric: pleasant and appropriate    Assessment/Plan: 1. Functional deficits  secondary to right PCA infarct which require 3+ hours per day of interdisciplinary therapy in a comprehensive inpatient rehab setting. Physiatrist is providing close team  supervision and 24 hour management of active medical problems listed below. Physiatrist and rehab team continue to assess barriers to discharge/monitor patient progress toward functional and medical goals.  Function:  Bathing Bathing position   Position: Shower  Bathing parts Body parts bathed by patient: Right arm, Left lower leg, Left arm, Chest, Abdomen, Front perineal area, Buttocks, Right upper leg, Left upper leg, Right lower leg Body parts bathed by helper: Back, Buttocks  Bathing assist Assist Level: Touching or steadying assistance(Pt > 75%)      Upper Body Dressing/Undressing Upper body dressing   What is the patient wearing?: Pull over shirt/dress     Pull over shirt/dress - Perfomed by patient: Thread/unthread right sleeve, Thread/unthread left sleeve, Put head through opening, Pull shirt over trunk          Upper body assist Assist Level: Set up      Lower Body Dressing/Undressing Lower body dressing   What is the patient wearing?: Underwear, Pants, Socks, Shoes Underwear - Performed by patient: Thread/unthread right underwear leg, Thread/unthread left underwear leg, Pull underwear up/down   Pants- Performed by patient: Thread/unthread right pants leg, Thread/unthread left pants leg, Pull pants up/down, Fasten/unfasten pants       Socks - Performed by patient: Don/doff right sock, Don/doff left sock Socks - Performed by helper: Don/doff right sock, Don/doff left sock Shoes - Performed by patient: Don/doff right shoe, Don/doff left shoe, Fasten right, Fasten left            Lower  body assist Assist for lower body dressing: Touching or steadying assistance (Pt > 75%)      Toileting Toileting   Toileting steps completed by patient: Adjust clothing prior to toileting, Performs perineal hygiene, Adjust clothing after toileting Toileting steps completed by helper: Adjust clothing after toileting Toileting Assistive Devices: Grab bar or rail  Toileting assist  Assist level: Touching or steadying assistance (Pt.75%)   Transfers Chair/bed transfer   Chair/bed transfer method: Stand pivot Chair/bed transfer assist level: Touching or steadying assistance (Pt > 75%) Chair/bed transfer assistive device: Patent attorney     Max distance: 143ft Assist level: Touching or steadying assistance (Pt > 75%)   Wheelchair   Type: Manual Max wheelchair distance: 18ft  Assist Level: Touching or steadying assistance (Pt > 75%)  Cognition Comprehension Comprehension assist level: Follows basic conversation/direction with extra time/assistive device  Expression Expression assist level: Expresses basic needs/ideas: With extra time/assistive device  Social Interaction Social Interaction assist level: Interacts appropriately 90% of the time - Needs monitoring or encouragement for participation or interaction.  Problem Solving Problem solving assist level: Solves basic 50 - 74% of the time/requires cueing 25 - 49% of the time  Memory Memory assist level: Recognizes or recalls 50 - 74% of the time/requires cueing 25 - 49% of the time   Medical Problem List and Plan: 1. 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   -continue therapies, CIR level PT OT speech 2.  DVT Prophylaxis/Anticoagulation: Pharmaceutical: Lovenox 3. Pain Management: tylenol prn 4. Mood: LCSW to follow for evaluation and support.  5. Neuropsych: This patient is capable of making decisions on his own behalf. 6. Skin/Wound Care: routine pressure relief measures 7. Fluids/Electrolytes/Nutrition: intake 100 percent meals 8. HTN: Monitor BID. Continue  Vitals:   11/09/17 2032 11/10/17 0300  BP: 122/72 127/89  Pulse: 79 82  Resp:  17  Temp:  99 F (37.2 C)  SpO2:  100%  Controlled 11/10/2017 9. CAD s/p CABG: On lipitor and ASA, no chest pain 10 CKD stage III: SCr- 1.8. Cr 1.13 3/28 11.  UTI: Cx showed diptheroids (?contaminant) AsymptomaticD/C antibiotics 12. Seasonal allergies v/s URI: Encourage fluid intake. Continue Claritin.    - flonase added for nasal congestion with improvement.    LOS (Days) 4 A FACE TO FACE EVALUATION WAS PERFORMED  Erick Colace, MD 11/10/2017 10:43 AM

## 2017-11-11 ENCOUNTER — Inpatient Hospital Stay (HOSPITAL_COMMUNITY): Payer: Medicare Other | Admitting: Occupational Therapy

## 2017-11-11 ENCOUNTER — Inpatient Hospital Stay (HOSPITAL_COMMUNITY): Payer: Medicare Other

## 2017-11-11 ENCOUNTER — Inpatient Hospital Stay (HOSPITAL_COMMUNITY): Payer: Medicare Other | Admitting: Speech Pathology

## 2017-11-11 NOTE — Progress Notes (Signed)
Speech Language Pathology Daily Session Note  Patient Details  Name: Thomas [name]. MRN: [id number] Date of Birth: 1941-06-11  Today's Date: 11/11/2017 SLP Individual Time: 1000-1100 SLP Individual Time Calculation (min): 60 min  Short Term Goals: Week 1: SLP Short Term Goal 1 (Week 1): Pt will utilize external memory aids to recall dialy information with supervision cues.  SLP Short Term Goal 2 (Week 1): Pt will scan to left of midline with Min A cues to locate items in 75% of opportunities.  SLP Short Term Goal 3 (Week 1): Pt will demonstrate anticipatory awareness by listing safe vs. unsafe activities to participate in at home with Min A cues.  SLP Short Term Goal 4 (Week 1): Pt will demonstrate selective attention for ~ 45 minutes in moderately distracting environment with supervision cues.  SLP Short Term Goal 5 (Week 1): Pt will self-monitor and self-correct errors with Min A cues.   Skilled Therapeutic Interventions: Skilled treatment session focused on cognitive goals. Upon arrival, pt was sitting in recliner and required Mod A verbal cues for problem solving in regards to transferring to wheelchair. Pt propelled wheelchair down the hallway and required Max A faded to Min A verbal cues to attend to left field of environment to avoid obstacles. SLP also facilitated session by providing Min A verbal cues for problem solving during a complex medication management task. Given Mod A verbal cues, pt was able to attend to left field of environment and self-monitor and correct errors during task. Pt also demonstrated recall of current medications with Min A verbal cues. Pt left upright in recliner with quick release belt in place and alarm on. Continue with current plan of care.      Function:  Cognition Comprehension Comprehension assist level: Follows basic conversation/direction with extra time/assistive device  Expression   Expression assist level: Expresses basic needs/ideas: With  extra time/assistive device  Social Interaction Social Interaction assist level: Interacts appropriately 90% of the time - Needs monitoring or encouragement for participation or interaction.  Problem Solving Problem solving assist level: Solves basic 90% of the time/requires cueing < 10% of the time  Memory Memory assist level: Recognizes or recalls 75 - 89% of the time/requires cueing 10 - 24% of the time    Pain Pain Assessment Pain Scale: 0-10 Pain Score: 0-No pain  Therapy/Group: Individual Therapy  Roxan Hockey  SLP - Student 11/11/2017, 12:24 PM

## 2017-11-11 NOTE — Progress Notes (Signed)
PHYSICAL MEDICINE & REHABILITATION     PROGRESS NOTE    Subjective/Complaints: Patient sitting up in bed.  Just finished breakfast.  Reports no problems.  ROS: Patient denies fever, rash, sore throat, blurred vision, nausea, vomiting, diarrhea, cough, shortness of breath or chest pain, joint or back pain, headache, or mood change.   Objective: Vital Signs: Blood pressure 127/70, pulse 68, temperature 97.6 F (36.4 C), temperature source Oral, resp. rate 16, height 5\' 11"  (1.803 m), weight 77.9 kg (171 lb 11.8 oz), SpO2 97 %. No results found. No results for input(s): WBC, HGB, HCT, PLT in the last 72 hours. No results for input(s): NA, K, CL, GLUCOSE, BUN, CREATININE, CALCIUM in the last 72 hours.  Invalid input(s): CO CBG (last 3)  No results for input(s): GLUCAP in the last 72 hours.  Wt Readings from Last 3 Encounters:  11/06/17 77.9 kg (171 lb 11.8 oz)  11/05/17 75.5 kg (166 lb 7.2 oz)  10/14/17 76.2 kg (168 lb)    Physical Exam:  Constitutional: No distress . Vital signs reviewed. HEENT: EOMI, oral membranes moist Cardiovascular: RRR without murmur. No JVD    Respiratory: CTA Bilaterally without wheezes or rales. Normal effort    GI: BS +, non-tender, non-distended  Musculoskeletal:   No lower extremity edema Neurological: He is alert and oriented to person, place, and time. Reasonable insight and awareness  ongoing left inattention and left homonymous hemianopsia Mild dysarthria.   LUE: 4+/5 proximal to distal, RUE almost 5/5 RLE: 4+ to 5/5 proximal to distal LLE: 4 to 4+/5 proximal to distal LUE mild ataxia with FTN Senses pain and light touch in all 4  Skin: Warm Psychiatric: pleasant and appropriate    Assessment/Plan: 1. Functional deficits  secondary to right PCA infarct which require 3+ hours per day of interdisciplinary therapy in a comprehensive inpatient rehab setting. Physiatrist is providing close team supervision and 24 hour management  of active medical problems listed below. Physiatrist and rehab team continue to assess barriers to discharge/monitor patient progress toward functional and medical goals.  Function:  Bathing Bathing position   Position: Shower  Bathing parts Body parts bathed by patient: Right arm, Left lower leg, Left arm, Chest, Abdomen, Front perineal area, Buttocks, Right upper leg, Left upper leg, Right lower leg Body parts bathed by helper: Back, Buttocks  Bathing assist Assist Level: Touching or steadying assistance(Pt > 75%)      Upper Body Dressing/Undressing Upper body dressing   What is the patient wearing?: Pull over shirt/dress     Pull over shirt/dress - Perfomed by patient: Thread/unthread right sleeve, Thread/unthread left sleeve, Put head through opening, Pull shirt over trunk          Upper body assist Assist Level: Set up      Lower Body Dressing/Undressing Lower body dressing   What is the patient wearing?: Underwear, Pants, Socks, Shoes Underwear - Performed by patient: Thread/unthread right underwear leg, Thread/unthread left underwear leg, Pull underwear up/down   Pants- Performed by patient: Thread/unthread right pants leg, Thread/unthread left pants leg, Pull pants up/down, Fasten/unfasten pants       Socks - Performed by patient: Don/doff right sock, Don/doff left sock Socks - Performed by helper: Don/doff right sock, Don/doff left sock Shoes - Performed by patient: Don/doff right shoe, Don/doff left shoe, Fasten right, Fasten left            Lower body assist Assist for lower body dressing: Touching or steadying assistance (Pt >  75%)      Toileting Toileting   Toileting steps completed by patient: Adjust clothing prior to toileting, Performs perineal hygiene, Adjust clothing after toileting Toileting steps completed by helper: Adjust clothing after toileting Toileting Assistive Devices: Grab bar or rail  Toileting assist Assist level: Touching or steadying  assistance (Pt.75%)   Transfers Chair/bed transfer   Chair/bed transfer method: Stand pivot Chair/bed transfer assist level: Touching or steadying assistance (Pt > 75%) Chair/bed transfer assistive device: Patent attorney     Max distance: 167ft Assist level: Touching or steadying assistance (Pt > 75%)   Wheelchair   Type: Manual Max wheelchair distance: 17ft  Assist Level: Touching or steadying assistance (Pt > 75%)  Cognition Comprehension Comprehension assist level: Follows basic conversation/direction with extra time/assistive device  Expression Expression assist level: Expresses basic needs/ideas: With extra time/assistive device  Social Interaction Social Interaction assist level: Interacts appropriately 90% of the time - Needs monitoring or encouragement for participation or interaction.  Problem Solving Problem solving assist level: Solves basic 50 - 74% of the time/requires cueing 25 - 49% of the time  Memory Memory assist level: Recognizes or recalls 50 - 74% of the time/requires cueing 25 - 49% of the time   Medical Problem List and Plan: 1. 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   -continue therapies, CIR level PT OT speech--focus on visual spatial awareness and safety 2.  DVT Prophylaxis/Anticoagulation: Pharmaceutical: Lovenox 3. Pain Management: tylenol prn 4. Mood: LCSW to follow for evaluation and support.  5. Neuropsych: This patient is capable of making decisions on his own behalf. 6. Skin/Wound Care: routine pressure relief measures 7. Fluids/Electrolytes/Nutrition: intake 100 percent 8. HTN: Monitor BID. Continue  Vitals:   11/11/17 0108 11/11/17 0825  BP: 125/69 127/70  Pulse: 63 68  Resp: 16   Temp: 97.6 F (36.4 C)   SpO2: 97%   Controlled 4/1 9. CAD s/p CABG: On lipitor and ASA, no chest pain 10 CKD stage III: SCr- 1.8. Cr 1.13 3/28 11.  UTI: Cx showed diptheroids (?contaminant)   12. Seasonal allergies v/s URI: Encourage fluid intake. Continue Claritin.    - flonase added for nasal congestion with improvement.    LOS (Days) 5 A FACE TO FACE EVALUATION WAS PERFORMED  Ranelle Oyster, MD 11/11/2017 8:41 AM

## 2017-11-11 NOTE — Progress Notes (Signed)
Physical Therapy Note  Patient Details  Name: Thomas [name]. MRN: [id number] Date of Birth: April 22, 1941 Today's Date: 11/11/2017  1400-1450, 50 min individual tx Pain:none per pt  neuromuscular re-education via forced use, multimodal cues for isolated LLE hip and knee extension in sitting in w/c, using Kinetron at level 50 cm/sec with PT providing resistance for alternmating reciprocal movement bil LEs.  Stand pivot w/c> mat to R with min/moda assist.  Therapeutic activity reaching slightly out of BOS with L/R hands to facilitate trunk shortening/lengthening/roataing. Pt stood on wedge using bil hands to open Rx bottles on his L, with bil hands.  He had 2 LOB backwards while manipulating 6 bottles.  Pt needed extra time and cues to attend to L.    sustaned stretch bil heel cords and hamstrings, and balance challenge, standing on wedge iwht bil UE support., x 30 seconds, x 1 minute.  Pt tends to hyperextend bil knees, L>R, and not fully wt bear on LLE in standing..    Gait training iwht RW on level tile x 100' with min assist.  Mod cues for upright trunk, forward gaze and steering RW.  Pt noted to tilt head to R during ambulation, with limited improveent with cues.  Pt left resting in recliner with all needs at hand, seat alarm set and quick release belt donned.  LEs elevated.   See function navigator for current status.  Aberdeen Hafen 11/11/2017, 12:15 PM

## 2017-11-11 NOTE — Progress Notes (Signed)
Occupational Therapy Session Note  Patient Details  Name: Thomas [name]. MRN: [id number] Date of Birth: 26-Apr-1941  Today's Date: 11/11/2017 OT Individual Time: 6222-9798 OT Individual Time Calculation (min): 75 min    Short Term Goals: Week 1:  OT Short Term Goal 1 (Week 1): STGs=LTGs due to ELOS  Skilled Therapeutic Interventions/Progress Updates:    Upon entering the room, pt supine in bed with wife present in room. Pt with no c/o pain and agreeable to OT intervention. Pt declined shower this session but requesting to change clothing. Pt performed supine >sit with min A to EOB. Pt ambulating with RW and steady assistance into bathroom for toileting with steady assistance for transfer, hygiene, and clothing management. Pt donning clean clothing from seated position on EOB. Pt standing to wash buttocks with min A for balance and pt displaying posterior lean. Pt donned LB clothing with steady assistance for balance in standing. UB clothing donned with supervision and set up to obtain needed items. OT checked off pt's wife for stand pivot transfers from bed <> recliner chair with multiple practices. Pt ambulating 60' to [facility] gym with RW and steady assistance for dynavision task. In standing, pt hitting 12 targets within 2 minutes on board with 13 second reaction time on L and 5 sec reaction time on R. Pt taking seated rest break and then attempting again with only L side turned on and pt able to hit 22 targets on L side this time within same period of time. Pt required max multimodal cues throughout session to locate items on L side for self care and dynavision. Pt needing cues and assistance with advancement of RW for safety as well as cues for forward head. Pt returning to room and seated in recliner chair with wife present. Chair alarm activated and quick release belt donned.   Therapy Documentation Precautions:  Precautions Precautions: Fall Precaution Comments: L  hemianopsia Restrictions Weight Bearing Restrictions: No General:   Vital Signs:   Pain: Pain Assessment Pain Scale: 0-10 Pain Score: 0-No pain ADL: ADL ADL Comments: See functional navigator Vision   Perception    Praxis   Exercises:   Other Treatments:    See Function Navigator for Current Functional Status.   Therapy/Group: Individual Therapy  Alen Bleacher 11/11/2017, 12:51 PM

## 2017-11-12 ENCOUNTER — Inpatient Hospital Stay (HOSPITAL_COMMUNITY): Payer: Medicare Other | Admitting: Occupational Therapy

## 2017-11-12 ENCOUNTER — Inpatient Hospital Stay (HOSPITAL_COMMUNITY): Payer: Medicare Other | Admitting: Physical Therapy

## 2017-11-12 ENCOUNTER — Inpatient Hospital Stay (HOSPITAL_COMMUNITY): Payer: Medicare Other | Admitting: Speech Pathology

## 2017-11-12 NOTE — Progress Notes (Signed)
Physical Therapy Note  Patient Details  Name: Thomas [name]. MRN: [id number] Date of Birth: Oct 04, 1940 Today's Date: 11/12/2017    Time: 1130-1155 25 minutes  1:1 No c/o pain.  Pt performs stand pivot transfers without device with min A, max cuing for safety and technique.  Sit to stand without UEs for emphasis on forward wt shift and LE control.  Pt requires min A for standing and for eccentric control with sitting.  Standing reaching task with focus on fwd wt shifts with pt requiring mod manual facilitation and significant fwd reach to decrease posterior lean.  Pt left in room with alarm on, needs at hand.  Time 2: 1300-1342 42 minutes  1:1 No c/o pain. Pt performs gait with RW 50' x 2 with min A.  Gait with obstacle negotiation with mod cues for scanning Lt, min A for balance with RW.  Standing squat and reach task with pt grabbing cones in numerical order with pt requiring min A for balance, supervision cues for scanning Lt.  nustep per pt request x 6 minutes level 5.  Pt left in room with alarm set, wife present, needs at hand.   DONAWERTH,KAREN 11/12/2017, 12:17 PM

## 2017-11-12 NOTE — Progress Notes (Signed)
Occupational Therapy Session Note  Patient Details  Name: Thomas [name]. MRN: [id number] Date of Birth: [birth year]  Today's Date: 11/12/2017 OT Individual Time: 3748-2707 OT Individual Time Calculation (min): 70 min    Short Term Goals: Week 1:  OT Short Term Goal 1 (Week 1): STGs=LTGs due to ELOS  Skilled Therapeutic Interventions/Progress Updates:    Upon entering the room, pt supine in bed sleeping but agreeable to OT intervention. Pt with no c/o pain. Pt impulsive this session and attempting to get out of bed as therapist enters room. Pt standing with min A and use of RW with posterior lean and L knee hyperextended. Pt ambulating 10' into bathroom with steady assistance and mod multimodal cues to advance RW and locate bathroom door on L side. Pt transferred into shower and doffed clothing from seated position. Pt bathing self while seated on TTB with lateral leans and min A for safety. Pt sitting on EOB for dressing with clothing items places on L side for visual scanning. Pt needing min A for standing balance with LB clothing management. He utilized figure four position to don B socks and shoes. Pt transferring to recliner chair with quick release belt donned and chair alarm activated. OT assisted pt with set up of items on breakfast tray. Pt able to locate all items on tray. Call bell within reach.   Therapy Documentation Precautions:  Precautions Precautions: Fall Precaution Comments: L hemianopsia Restrictions Weight Bearing Restrictions: No General:   Vital Signs:   Pain: Pain Assessment Pain Scale: 0-10 Pain Score: 0-No pain ADL: ADL ADL Comments: See functional navigator Vision   Perception    Praxis   Exercises:   Other Treatments:    See Function Navigator for Current Functional Status.   Therapy/Group: Individual Therapy  Alen Bleacher 11/12/2017, 12:12 PM

## 2017-11-12 NOTE — Patient Care Conference (Signed)
Inpatient RehabilitationTeam Conference and Plan of Care Update Date: 11/12/2017   Time: 2:30 PM    Patient Name: Thomas [name].      Medical Record Number: [id number]  Date of Birth: [birth year] Sex: Male         Room/Bed: 4W09C/4W09C-01 Payor Info: Payor: Multimedia programmer / Plan: [employer] MEDICARE / Product Type: *No Product type* /    Admitting Diagnosis: CVA  Admit Date/Time:  11/06/2017  2:59 PM Admission Comments: No comment available   Primary Diagnosis:  <principal problem not specified> Principal Problem: <principal problem not specified>  Patient Active Problem List   Diagnosis Date Noted  . 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 Benign essential HTN   . Coronary artery disease involving native coronary artery of native heart without angina pectoris   . Stage 3 chronic kidney disease (HCC)   . Acute lower UTI   . Seasonal allergies   . Stroke (HCC) 11/04/2017  . HLD (hyperlipidemia) 11/04/2017  . Chronic diastolic CHF (congestive heart failure) (HCC) 11/04/2017  . Acute renal failure superimposed on stage 2 chronic kidney disease (HCC) 11/04/2017  . Acute metabolic encephalopathy 11/04/2017  . UTI (urinary tract infection) 11/04/2017  . AKI (acute kidney injury) (HCC)   . History of CVA with residual deficit   . Prediabetes   . Left hemiparesis (HCC) 03/19/2013  . Cerebral infarction (HCC) 03/19/2013  . Left leg weakness 03/19/2013  . CAD, ARTERY BYPASS GRAFT 09/30/2008  . 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 Borderline hyperglycemia 12/03/2007  . Other and unspecified hyperlipidemia 07/28/2007  . Essential hypertension 07/28/2007  . 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 Coronary atherosclerosis 07/28/2007  . BENIGN PROSTATIC HYPERTROPHY 07/28/2007  . COLONIC POLYPS, 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    Expected Discharge Date: Expected Discharge Date: 11/22/17  Team Members Present: Physician leading conference: Dr. Faith Rogue Social Worker Present: Amada Jupiter, LCSW Nurse Present: Keturah Barre, RN PT Present: Karolee Stamps, Sherre Lain, PT OT Present: Roney Mans, OT SLP Present: Feliberto Gottron, SLP PPS Coordinator present : Tora Duck, RN, CRRN     Current Status/Progress Goal Weekly Team Focus  Medical   Right PCA infarct with left neglect and homonymous hemianopsia.  Hypertension under better control.  Patient's appetite is great  Continue to improve patient's visual spatial awareness  Blood pressure and heart rate control, stroke education,     Bowel/Bladder   continent of b/b with frequent voids, q 1-1/2 hours, LBM 4/1  manage bowel and bladder with mod I assist  continue to monitor b/b   Swallow/Nutrition/ Hydration             ADL's   min A overall  mod I for UB and LB dressing and bathing, Supervision for toileting  pt/family edu, ADL retraining, balance, functional transfers, endurance, strengthening   Mobility   mod A transfers, min A for RW  supervision transfers and gait  family ed, balance, strengthening   Communication             Safety/Cognition/ Behavioral Observations  Mod A  Min A  attention, awareness, recall and problem solving    Pain   denies pain although grimaces and states his back hurts while moving or groans loudly when moving to get up  pain < 2  monitor pain q shift and prn, administer meds as ordered   Skin   loop recorder incision to chest D/I, numerous bruises on arms and legs, abrasion to left arm, dryness  to skin and feet could benefit from eucerin cream?  maintain skin without further breakdown while on IPR  monitor skin and treat as ordered    Rehab Goals Patient on target to meet rehab goals: Yes *See Care Plan and progress notes for long and short-term goals.     Barriers to Discharge  Current Status/Progress Possible Resolutions Date Resolved   Physician    Medical stability        Continue management as above.  Patient will need supervision at home      Nursing                   PT                    OT                  SLP                SW                Discharge Planning/Teaching Needs:  Plan home with wife who can provide any needed assistance.  Teaching to be scheduled prior to d/c.   Team Discussion:  Cont b/b.  No significant medical issues.  Impulsive and using bed and chair alarms.  Min assist with transfers and gait.  Left inattention and leans backwards.  Cognition overall at mod assist.  Aiming for supervision goals overall.  Revisions to Treatment Plan:  None    Continued Need for Acute Rehabilitation Level of Care: The patient requires daily medical management by a physician with specialized training in physical medicine and rehabilitation for the following conditions: Daily direction of a multidisciplinary physical rehabilitation program to ensure safe treatment while eliciting the highest outcome that is of practical value to the patient.: Yes Daily medical management of patient stability for increased activity during participation in an intensive rehabilitation regime.: Yes Daily analysis of laboratory values and/or radiology reports with any subsequent need for medication adjustment of medical intervention for : Neurological problems;Blood pressure problems  Shalika Arntz 11/13/2017, 3:25 PM

## 2017-11-12 NOTE — Care Management Note (Signed)
[facility]patient [facility] Individual Statement of Services  Patient Name:  Thomas [name].  Date:  11/08/2017  Welcome to the [facility]patient [facility].  Our goal is to provide you with an individualized program based on your diagnosis and situation, designed to meet your specific needs.  With this comprehensive rehabilitation program, you will be expected to participate in at least 3 hours of rehabilitation therapies Monday-Friday, with modified therapy programming on the weekends.  Your rehabilitation program will include the following services:  Physical Therapy (PT), Occupational Therapy (OT), Speech Therapy (ST), 24 hour per day rehabilitation nursing, Therapeutic Recreaction (TR), Neuropsychology, Case Management (Social Worker), Rehabilitation Medicine, Nutrition Services and Pharmacy Services  Weekly team conferences will be held on Tuesdays to discuss your progress.  Your Social Worker will talk with you frequently to get your input and to update you on team discussions.  Team conferences with you and your family in attendance may also be held.  Expected length of stay: 5-7 days    Overall anticipated outcome: supervision  Depending on your progress and recovery, your program may change. Your Social Worker will coordinate services and will keep you informed of any changes. Your Social Worker's name and contact numbers are listed  below.  The following services may also be recommended but are not provided by the Inpatient Rehabilitation Center:   Driving Evaluations  Home Health Rehabiltiation Services  Outpatient Rehabilitation Services    Arrangements will be made to provide these services after discharge if needed.  Arrangements include referral to agencies that provide these services.  Your insurance has been verified to be:  [facility] Medicare Your primary doctor is:  Wellsite geologist  Pertinent information will be shared with your doctor and your insurance  company.  Social Worker:  [place], [place] [phone] or [phone]   Information discussed with and copy given to patient by: Amada Jupiter, 11/08/2017, 9:50 AM

## 2017-11-12 NOTE — Plan of Care (Signed)
  Problem: RH BLADDER ELIMINATION Goal: RH STG MANAGE BLADDER WITH ASSISTANCE Description STG Manage Bladder With Assistance. Mod I  Outcome: Progressing   Problem: RH SAFETY Goal: RH STG ADHERE TO SAFETY PRECAUTIONS W/ASSISTANCE/DEVICE Description STG Adhere to Safety Precautions With Assistance/Device. Min  Outcome: Progressing    Pt progressing, up to void q 1-2 hours, using call light appropriately

## 2017-11-12 NOTE — Progress Notes (Signed)
Speech Language Pathology Daily Session Note  Patient Details  Name: Thomas [name]. MRN: [id number] Date of Birth: [birth year]  Today's Date: 11/12/2017 SLP Individual Time: 6203-5597 SLP Individual Time Calculation (min): 55 min  Short Term Goals: Week 1: SLP Short Term Goal 1 (Week 1): Pt will utilize external memory aids to recall dialy information with supervision cues.  SLP Short Term Goal 2 (Week 1): Pt will scan to left of midline with Min A cues to locate items in 75% of opportunities.  SLP Short Term Goal 3 (Week 1): Pt will demonstrate anticipatory awareness by listing safe vs. unsafe activities to participate in at home with Min A cues.  SLP Short Term Goal 4 (Week 1): Pt will demonstrate selective attention for ~ 45 minutes in moderately distracting environment with supervision cues.  SLP Short Term Goal 5 (Week 1): Pt will self-monitor and self-correct errors with Min A cues.   Skilled Therapeutic Interventions: Skilled treatment session focused on cognitive goals. SLP facilitated session by providing Min A verbal cues for problem solving during an unfnishied complex medication management task. Pt required Min A verbal cues to attend to left field of environment throughout task. Pt able to independently self-monitor his activities; however, required Mod A verbal cues to correct errors during task. Pt demonstrated selective attention in a mildly distracting environment for ~45 minutes with Min A verbal cues for redirection. Pt left upright in recliner with quick release belt on and wife present. Continue with current plan of care.      Function:  Cognition Comprehension Comprehension assist level: Follows basic conversation/direction with extra time/assistive device  Expression   Expression assist level: Expresses basic needs/ideas: With extra time/assistive device  Social Interaction Social Interaction assist level: Interacts appropriately 75 - 89% of the time - Needs  redirection for appropriate language or to initiate interaction.  Problem Solving Problem solving assist level: Solves basic 90% of the time/requires cueing < 10% of the time  Memory Memory assist level: Recognizes or recalls 50 - 74% of the time/requires cueing 25 - 49% of the time    Pain Pain Assessment Pain Score: 0-No pain  Therapy/Group: Individual Therapy  Roxan Hockey  SLP - Student 11/12/2017, 9:45 AM

## 2017-11-12 NOTE — Progress Notes (Signed)
[facility]ILIT[facility]     PROGRESS NOTE    Subjective/Complaints: Patient up in shower with occupational therapy this morning.  No new problems overnight per nurse.  Up fairly frequently to the bathroom every 1-2 hours  ROS: Patient denies fever, rash, sore throat, blurred vision, nausea, vomiting, diarrhea, cough, shortness of breath or chest pain, joint or back pain, headache, or mood change.   Objective: Vital Signs: Blood pressure 105/75, pulse 84, temperature 97.7 F (36.5 C), temperature source Oral, resp. rate 20, height 5\' 11"  (1.803 m), weight 70 kg (154 lb 5.2 oz), SpO2 97 %. No results found. No results for input(s): WBC, HGB, HCT, PLT in the last 72 hours. No results for input(s): NA, K, CL, GLUCOSE, BUN, CREATININE, CALCIUM in the last 72 hours.  Invalid input(s): CO CBG (last 3)  No results for input(s): GLUCAP in the last 72 hours.  Wt Readings from Last 3 Encounters:  11/12/17 70 kg (154 lb 5.2 oz)  11/05/17 75.5 kg (166 lb 7.2 oz)  10/14/17 76.2 kg (168 lb)    Physical Exam:  Constitutional: No distress . Vital signs reviewed. HEENT: EOMI, oral membranes moist Cardiovascular: RRR without murmur. No JVD    Respiratory: CTA Bilaterally without wheezes or rales. Normal effort    GI: BS +, non-tender, non-distended   Musculoskeletal:   No lower extremity edema Neurological: He is alert and oriented to person, place, and time. Reasonable insight and awareness  persistent left inattention and homonymous hemianopsia Mild dysarthria.   LUE: 4+/5 proximal to distal, RUE almost 5/5 RLE: 4+ to 5/5 proximal to distal LLE: 4 to 4+/5 proximal to distal motor  exam stable LUE mild ataxia with FTN Senses pain and light touch in all 4  Skin: Warm Psychiatric: pleasant and appropriate    Assessment/Plan: 1. Functional deficits  secondary to right PCA infarct which require 3+ hours per day of interdisciplinary therapy in a comprehensive  inpatient rehab setting. Physiatrist is providing close team supervision and 24 hour management of active medical problems listed below. Physiatrist and rehab team continue to assess barriers to discharge/monitor patient progress toward functional and medical goals.  Function:  Bathing Bathing position   Position: Shower  Bathing parts Body parts bathed by patient: Right arm, Left lower leg, Left arm, Chest, Abdomen, Front perineal area, Buttocks, Right upper leg, Left upper leg, Right lower leg Body parts bathed by helper: Back, Buttocks  Bathing assist Assist Level: Touching or steadying assistance(Pt > 75%)      Upper Body Dressing/Undressing Upper body dressing   What is the patient wearing?: Pull over shirt/dress     Pull over shirt/dress - Perfomed by patient: Thread/unthread right sleeve, Thread/unthread left sleeve, Put head through opening, Pull shirt over trunk          Upper body assist Assist Level: Set up, Supervision or verbal cues   Set up : To obtain clothing/put away  Lower Body Dressing/Undressing Lower body dressing   What is the patient wearing?: Underwear, Pants, Socks, Shoes Underwear - Performed by patient: Thread/unthread right underwear leg, Thread/unthread left underwear leg, Pull underwear up/down   Pants- Performed by patient: Thread/unthread right pants leg, Thread/unthread left pants leg, Pull pants up/down, Fasten/unfasten pants       Socks - Performed by patient: Don/doff right sock, Don/doff left sock Socks - Performed by helper: Don/doff right sock, Don/doff left sock Shoes - Performed by patient: Don/doff right shoe, Don/doff left shoe, Fasten right, [employer]  left            Lower body assist Assist for lower body dressing: Touching or steadying assistance (Pt > 75%)      Toileting Toileting   Toileting steps completed by patient: Adjust clothing prior to toileting, Performs perineal hygiene, Adjust clothing after toileting Toileting  steps completed by helper: Adjust clothing after toileting Toileting Assistive Devices: Grab bar or rail  Toileting assist Assist level: Touching or steadying assistance (Pt.75%)   Transfers Chair/bed transfer   Chair/bed transfer method: Stand pivot Chair/bed transfer assist level: Moderate assist (Pt 50 - 74%/lift or lower) Chair/bed transfer assistive device: Patent attorney     Max distance: 100 Assist level: Touching or steadying assistance (Pt > 75%)   Wheelchair   Type: Manual Max wheelchair distance: 164ft  Assist Level: Touching or steadying assistance (Pt > 75%)  Cognition Comprehension Comprehension assist level: Follows basic conversation/direction with extra time/assistive device  Expression Expression assist level: Expresses basic needs/ideas: With extra time/assistive device  Social Interaction Social Interaction assist level: Interacts appropriately 90% of the time - Needs monitoring or encouragement for participation or interaction.  Problem Solving Problem solving assist level: Solves basic 90% of the time/requires cueing < 10% of the time  Memory Memory assist level: Recognizes or recalls 75 - 89% of the time/requires cueing 10 - 24% of the time   Medical Problem List and Plan: 1. 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   -continue therapies, CIR level PT OT speech--team conference today 2.  DVT Prophylaxis/Anticoagulation: Pharmaceutical: Lovenox 3. Pain Management: tylenol prn 4. Mood: LCSW to follow for evaluation and support.  5. Neuropsych: This patient is capable of making decisions on his own behalf. 6. Skin/Wound Care: routine pressure relief measures 7. Fluids/Electrolytes/Nutrition: intake 100 percent 8. HTN: Monitor BID. Continue  Vitals:   11/12/17 0500 11/12/17 0805  BP: 140/81 105/75  Pulse: 70 84  Resp: 16 20  Temp: 97.6 F (36.4 C)  97.7 F (36.5 C)  SpO2: 100% 97%  Controlled 4/2 9. CAD s/p CABG: On lipitor and ASA, no chest pain 10 CKD stage III: SCr- 1.8. Cr 1.13 3/28 11. UTI: Cx showed diptheroids (?contaminant)   12. Seasonal allergies v/s URI: Encourage fluid intake. Continue Claritin.    - flonase added for nasal congestion with improvement.    LOS (Days) 6 A FACE TO FACE EVALUATION WAS PERFORMED  Ranelle Oyster, MD 11/12/2017 8:28 AM

## 2017-11-13 ENCOUNTER — Inpatient Hospital Stay (HOSPITAL_COMMUNITY): Payer: Medicare Other | Admitting: Physical Therapy

## 2017-11-13 ENCOUNTER — Inpatient Hospital Stay (HOSPITAL_COMMUNITY): Payer: Medicare Other | Admitting: Occupational Therapy

## 2017-11-13 ENCOUNTER — Inpatient Hospital Stay (HOSPITAL_COMMUNITY): Payer: Medicare Other | Admitting: Speech Pathology

## 2017-11-13 LAB — CBC
HCT: 38.8 % — ABNORMAL LOW (ref 39.0–52.0)
Hemoglobin: 12.9 g/dL — ABNORMAL LOW (ref 13.0–17.0)
MCH: 30.8 pg (ref 26.0–34.0)
MCHC: 33.2 g/dL (ref 30.0–36.0)
MCV: 92.6 fL (ref 78.0–100.0)
Platelets: 244 10*3/uL (ref 150–400)
RBC: 4.19 MIL/uL — ABNORMAL LOW (ref 4.22–5.81)
RDW: 13.3 % (ref 11.5–15.5)
WBC: 7.7 10*3/uL (ref 4.0–10.5)

## 2017-11-13 LAB — BASIC METABOLIC PANEL
Anion gap: 10 (ref 5–15)
BUN: 20 mg/dL (ref 6–20)
CO2: 24 mmol/L (ref 22–32)
Calcium: 8.9 mg/dL (ref 8.9–10.3)
Chloride: 99 mmol/L — ABNORMAL LOW (ref 101–111)
Creatinine, Ser: 1.12 mg/dL (ref 0.61–1.24)
GFR calc Af Amer: >60 mL/min (ref >60)
GLUCOSE: 112 mg/dL — AB (ref 65–99)
POTASSIUM: 4 mmol/L (ref 3.5–5.1)
SODIUM: 133 mmol/L — AB (ref 135–145)

## 2017-11-13 NOTE — Progress Notes (Signed)
[facility]IC[facility] RE[facility]ILIT[facility]     PROGRESS NOTE    Subjective/Complaints: Patient resting comfortably.  No new complaints.  Denies pain  ROS: Patient denies fever, rash, sore throat, blurred vision, nausea, vomiting, diarrhea, cough, shortness of breath or chest pain, joint or back pain, headache, or mood change.   Objective: Vital Signs: Blood pressure (!) 141/69, pulse 88, temperature 97.9 F (36.6 C), temperature source Oral, resp. rate 17, height 5\' 11"  (1.803 m), weight 70.1 kg (154 lb 8.7 oz), SpO2 92 %. No results found. Recent Labs    11/13/17 0627  WBC 7.7  HGB 12.9*  HCT 38.8*  PLT 244   Recent Labs    11/13/17 0627  NA 133*  K 4.0  CL 99*  GLUCOSE 112*  BUN 20  CREATININE 1.12  CALCIUM 8.9   CBG (last 3)  No results for input(s): GLUCAP in the last 72 hours.  Wt Readings from Last 3 Encounters:  11/13/17 70.1 kg (154 lb 8.7 oz)  11/05/17 75.5 kg (166 lb 7.2 oz)  10/14/17 76.2 kg (168 lb)    Physical Exam:  Constitutional: No distress . Vital signs reviewed. HEENT: EOMI, oral membranes moist Cardiovascular: RRR without murmur. No JVD    Respiratory: CTA Bilaterally without wheezes or rales. Normal effort    GI: BS +, non-tender, non-distended  Musculoskeletal:   No lower extremity edema Neurological: He is alert and oriented to person, place, and time. Reasonable insight and awareness  persistent left inattention and homonymous hemianopsia--unchanged Mild dysarthria.   LUE: 4+/5 proximal to distal, RUE almost 5/5 RLE: 4+ to 5/5 proximal to distal LLE: 4 to 4+/5 proximal to distal motor  exam stable LUE mild ataxia with FTN Senses pain and light touch in all 4  Skin: Warm Psychiatric: pleasant and appropriate    Assessment/Plan: 1. Functional deficits  secondary to right PCA infarct which require 3+ hours per day of interdisciplinary therapy in a comprehensive inpatient rehab setting. Physiatrist is providing close team  supervision and 24 hour management of active medical problems listed below. Physiatrist and rehab team continue to assess barriers to discharge/monitor patient progress toward functional and medical goals.  Function:  Bathing Bathing position   Position: Shower  Bathing parts Body parts bathed by patient: Right arm, Left lower leg, Left arm, Chest, Abdomen, Front perineal area, Buttocks, Right upper leg, Left upper leg, Right lower leg Body parts bathed by helper: Back  Bathing assist Assist Level: Touching or steadying assistance(Pt > 75%)      Upper Body Dressing/Undressing Upper body dressing   What is the patient wearing?: Pull over shirt/dress     Pull over shirt/dress - Perfomed by patient: Thread/unthread right sleeve, Thread/unthread left sleeve, Put head through opening, Pull shirt over trunk          Upper body assist Assist Level: Set up, Supervision or verbal cues   Set up : To obtain clothing/put away  Lower Body Dressing/Undressing Lower body dressing   What is the patient wearing?: Underwear, Pants, Socks, Shoes Underwear - Performed by patient: Thread/unthread right underwear leg, Thread/unthread left underwear leg, Pull underwear up/down   Pants- Performed by patient: Thread/unthread right pants leg, Thread/unthread left pants leg, Pull pants up/down, Fasten/unfasten pants       Socks - Performed by patient: Don/doff right sock, Don/doff left sock Socks - Performed by helper: Don/doff right sock, Don/doff left sock Shoes - Performed by patient: Don/doff right shoe, Don/doff left shoe, Fasten right,  Fasten left            Lower body assist Assist for lower body dressing: Touching or steadying assistance (Pt > 75%)      Toileting Toileting   Toileting steps completed by patient: Adjust clothing prior to toileting, Performs perineal hygiene, Adjust clothing after toileting Toileting steps completed by helper: Adjust clothing after toileting Toileting  Assistive Devices: Grab bar or rail  Toileting assist Assist level: Touching or steadying assistance (Pt.75%)   Transfers Chair/bed transfer   Chair/bed transfer method: Stand pivot Chair/bed transfer assist level: Touching or steadying assistance (Pt > 75%) Chair/bed transfer assistive device: Patent attorney     Max distance: 100 Assist level: Touching or steadying assistance (Pt > 75%)   Wheelchair   Type: Manual Max wheelchair distance: 122ft  Assist Level: Touching or steadying assistance (Pt > 75%)  Cognition Comprehension Comprehension assist level: Follows basic conversation/direction with extra time/assistive device  Expression Expression assist level: Expresses basic needs/ideas: With extra time/assistive device  Social Interaction Social Interaction assist level: Interacts appropriately 75 - 89% of the time - Needs redirection for appropriate language or to initiate interaction.  Problem Solving Problem solving assist level: Solves basic 90% of the time/requires cueing < 10% of the time  Memory Memory assist level: Recognizes or recalls 50 - 74% of the time/requires cueing 25 - 49% of the time   Medical Problem List and Plan: 1. 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   -continue therapies, CIR level PT OT, SLP.    -Will need supervision at home 2.  DVT Prophylaxis/Anticoagulation: Pharmaceutical: Lovenox 3. Pain Management: tylenol prn 4. Mood: LCSW to follow for evaluation and support.  5. Neuropsych: This patient is capable of making decisions on his own behalf. 6. Skin/Wound Care: routine pressure relief measures 7. Fluids/Electrolytes/Nutrition: intake remains excellent  -I personally reviewed the patient's labs today.  All labs normal 8. HTN: Monitor BID. Continue  Vitals:   11/13/17 0437 11/13/17 0805  BP: 135/80 (!) 141/69  Pulse: 83 88  Resp: 17    Temp: 97.9 F (36.6 C)   SpO2: 92%   Controlled 4/3 9. CAD s/p CABG: On lipitor and ASA, no chest pain 10 CKD stage III: SCr- 1.8. Cr 1.13 3/28 11. UTI: Cx showed diptheroids (?contaminant)   12. Seasonal allergies v/s URI: Encourage fluid intake. Continue Claritin.    - flonase added for nasal congestion with improvement.    LOS (Days) 7 A FACE TO FACE EVALUATION WAS PERFORMED  Ranelle Oyster, MD 11/13/2017 8:32 AM

## 2017-11-13 NOTE — Plan of Care (Signed)
  Problem: RH BLADDER ELIMINATION Goal: RH STG MANAGE BLADDER WITH ASSISTANCE Description STG Manage Bladder With Assistance. Mod I  Outcome: Progressing   Problem: RH SAFETY Goal: RH STG ADHERE TO SAFETY PRECAUTIONS W/ASSISTANCE/DEVICE Description STG Adhere to Safety Precautions With Assistance/Device. Min  Outcome: Progressing

## 2017-11-13 NOTE — Progress Notes (Signed)
Speech Language Pathology Daily Session Note  Patient Details  Name: Thomas [name]. MRN: [id number] Date of Birth: [birth year]  Today's Date: 11/13/2017 SLP Individual Time: 1400-1455 SLP Individual Time Calculation (min): 55 min  Short Term Goals: Week 1: SLP Short Term Goal 1 (Week 1): Pt will utilize external memory aids to recall dialy information with supervision cues.  SLP Short Term Goal 2 (Week 1): Pt will scan to left of midline with Min A cues to locate items in 75% of opportunities.  SLP Short Term Goal 3 (Week 1): Pt will demonstrate anticipatory awareness by listing safe vs. unsafe activities to participate in at home with Min A cues.  SLP Short Term Goal 4 (Week 1): Pt will demonstrate selective attention for ~ 45 minutes in moderately distracting environment with supervision cues.  SLP Short Term Goal 5 (Week 1): Pt will self-monitor and self-correct errors with Min A cues.   Skilled Therapeutic Interventions: Skilled treatment session focused on cognitive goals. SLP facilitated session by providing Mod A verbal cues for recall and Max A verbal cues for left attention during a moderately complex task ([employer]). Pt required Mod A verbal cues to self-monitor and correct errors during task. Pt left upright in recliner with alarm on, quick release belt in place, and all needs within reach. Continue with current plan of care.      Function:  Cognition Comprehension Comprehension assist level: Follows basic conversation/direction with extra time/assistive device  Expression   Expression assist level: Expresses basic needs/ideas: With extra time/assistive device  Social Interaction Social Interaction assist level: Interacts appropriately 75 - 89% of the time - Needs redirection for appropriate language or to initiate interaction.  Problem Solving Problem solving assist level: Solves basic 75 - 89% of the time/requires cueing 10 - 24% of the time  Memory Memory assist level:  Recognizes or recalls 50 - 74% of the time/requires cueing 25 - 49% of the time    Pain Pain Assessment Pain Scale: 0-10 Pain Score: 0-No pain  Therapy/Group: Individual Therapy  Roxan Hockey  SLP - Student 11/13/2017, 2:58 PM

## 2017-11-13 NOTE — Progress Notes (Signed)
Physical Therapy Note  Patient Details  Name: Thomas [name]. MRN: [id number] Date of Birth: Apr 02, 1941 Today's Date: 11/13/2017    Time: [phone] 55 minutes  1:1 No c/o pain.  Pt performs w/c mobility with min/mod cues for LT attention and problem solving obstacle negotiation. Gait with RW x 110' with min A, cues for Lt attention.  Obstacle negotiation with RW with pt requiring min cues for LT attention, able to problem solve obstacle negotiation with increased time.  Sit to stand repetitions with focus on fwd wt shift with pt reaching to hang clothespins on wire each stand. Pt performs with min/mod manual facilitation for fwd wt shifts.  nustep per pt request x 6 minutes level 4. Pt improving squat pivot transfers with min cuing and min A for safe technique.  Pt left in room with quick release belt donned, wife present.  Eliyana Pagliaro 11/13/2017, 10:25 AM

## 2017-11-13 NOTE — Progress Notes (Signed)
Occupational Therapy Session Note  Patient Details  Name: Thomas [name]. MRN: [id number] Date of Birth: 25-Apr-1941  Today's Date: 11/13/2017 OT Individual Time: 6948-5462 OT Individual Time Calculation (min): 58 min    Short Term Goals: Week 1:  OT Short Term Goal 1 (Week 1): STGs=LTGs due to ELOS  Skilled Therapeutic Interventions/Progress Updates:    Pt declined bathing this am but agreed to toileting and dressing.  He was able to ambulate to the bathroom with the RW and min assist but needed mod demonstrational cueing to stay inside of the walker and pull up to stand to urinate.  Educated pt and spouse that if he chooses to stand there in an increase risk in falling at the home and he will need to have his wife right there with him.  Noted increased trunk and cervical flexion in standing.  Once this was completed, he ambulated out to the sink to the wheelchair where he washed his hands and worked on dressing.  Therapist placed clothing items on the left of midline and he needed mod instructional cueing for larger head turns to locate them.  He donned UB clothing with supervision and LB clothing with min assist sit to stand.  He put on his shoes backwards to start and needed re-direction to place them on the correct foot.  Next therapist took pt down to the [facility] room for work on left attention and visual scanning using the [employer].  First set pt averaged 10 seconds for each light with 4-8 seconds needed to locate the ones in the left quadrants and 2 seconds on the right in standing.  Min assist for standing balance through intervals with increased LOB posteriorly.  Worked on teaching pt circular pattern of scanning.  On the second set he still needed an average of 5 seconds to locate all lights with 6-8 seconds needed on the left.  Performed some sets with items just on the left side and pt still exhibited delay in finding them.  When completing intervals of timed lights of 5 seconds, he  completed 1 minute trials of 50-75%.  Finished session with return to the room with pt transferring to the bedside recliner with call button in reach and safety belt and chair alarm in place.  Pt's wife present as well.  Discussed progress of treatment and pt's significant field cut still being an impairment.    Therapy Documentation Precautions:  Precautions Precautions: Fall Precaution Comments: L hemianopsia Restrictions Weight Bearing Restrictions: No  Pain: Pain Assessment Pain Scale: 0-10 Pain Score: 0-No pain ADL: See Function Navigator for Current Functional Status.   Therapy/Group: Individual Therapy  Scotlyn Mccranie OTR/L 11/13/2017, 2:25 PM

## 2017-11-13 NOTE — Progress Notes (Signed)
Social Work Patient ID: Thomas D Teo Jr., male   DOB: 06/13/1941, 76 y.o.   MRN: [id number]   Met with pt and wife yesterday afternoon to review team conference.  Both aware and agreeable with targeted d/c date of 4/12, however, pt had hoped for a shorter LOS.  Both aware and agreeable with supervision goals as well.  Continue to follow.  , , LCSW  

## 2017-11-14 ENCOUNTER — Inpatient Hospital Stay (HOSPITAL_COMMUNITY): Payer: Medicare Other | Admitting: Occupational Therapy

## 2017-11-14 ENCOUNTER — Inpatient Hospital Stay (HOSPITAL_COMMUNITY): Payer: Medicare Other | Admitting: Speech Pathology

## 2017-11-14 ENCOUNTER — Inpatient Hospital Stay (HOSPITAL_COMMUNITY): Payer: Medicare Other

## 2017-11-14 NOTE — Progress Notes (Signed)
Speech Language Pathology Weekly Progress and Session Note  Patient Details  Name: Thomas D Helbling Jr. MRN: [id number] Date of Birth: 09/20/1940  Beginning of progress report period: November 07, 2017 End of progress report period: November 14, 2017  Today's Date: 11/14/2017 SLP Individual Time: 1400-1505 SLP Individual Time Calculation (min): 65 min  Short Term Goals: Week 1: SLP Short Term Goal 1 (Week 1): Pt will utilize external memory aids to recall dialy information with supervision cues.  SLP Short Term Goal 1 - Progress (Week 1): Not met SLP Short Term Goal 2 (Week 1): Pt will scan to left of midline with Min A cues to locate items in 75% of opportunities.  SLP Short Term Goal 2 - Progress (Week 1): Not met SLP Short Term Goal 3 (Week 1): Pt will demonstrate anticipatory awareness by listing safe vs. unsafe activities to participate in at home with Min A cues.  SLP Short Term Goal 3 - Progress (Week 1): Met SLP Short Term Goal 4 (Week 1): Pt will demonstrate selective attention for ~ 45 minutes in moderately distracting environment with supervision cues.  SLP Short Term Goal 4 - Progress (Week 1): Met SLP Short Term Goal 5 (Week 1): Pt will self-monitor and self-correct errors with Min A cues.  SLP Short Term Goal 5 - Progress (Week 1): Not met    New Short Term Goals: Week 2: SLP Short Term Goal 1 (Week 2): Pt will utilize external memory aids to recall daily information with Min A verbal cues.  SLP Short Term Goal 2 (Week 2): Pt will scan to left of midline with Min A verbal cues to locate items in 75% of opportunities.  SLP Short Term Goal 3 (Week 2): Pt will demonstrate selective attention for ~60 minutes in a moderately distracting environment with Supervision verbal cues.  SLP Short Term Goal 4 (Week 2): Pt will self-monitor and correct errors during functional tasks with Min A verbal cues.  SLP Short Term Goal 5 (Week 2): Pt will complete basic problem solving tasks with Min A  verbal cues.   Weekly Progress Updates: Pt has made functional gains and has met 2 out of 5 STG's this reporting period. Currently, patient requires overall Mod A verbal cues for basic problem solving, recall, and attention to left field of environment during functional tasks. Pt able to self-monitor and correct errors during functional tasks with Mod A verbal cues. Pt also demonstrates selective attention in a mildly distracting environment for ~45 minutes with Supervision verbal cues. Patient and family education on going. Patient would benefit from continued skilled SLP intervention to maximize his cognitive function prior to discharge.      Intensity: Minumum of 1-2 x/day, 30 to 90 minutes Frequency: 3 to 5 out of 7 days Duration/Length of Stay: 4/12 Treatment/Interventions: Functional tasks;Patient/family education;Therapeutic Activities;Internal/external aids;Multimodal communication approach;Environmental controls;Cognitive remediation/compensation;Cueing hierarchy   Daily Session  Skilled Therapeutic Interventions: Skilled treatment session focused on cognitive goals. SLP facilitated session by providing Mod A verbal cues for recall of procedures of a task that pt learned in previous therapy session. Pt required Mod A verbal cues for problem solving and attention to left field of environment throughout the task. SLP also facilitated session by providing Min A verbal cues for problem solving and emergent awareness during a safety awareness task. Pt demonstrated selective attention in a mildly distracting environment for ~45 minutes with Supervision verbal cues for redirection. Pt ambulated to commode with RW and required Mod A verbal cues   for safety while dividing attention between voiding while standing and standing balance. Pt left upright in recliner with quick release belt in place, chair alarm on, and wife present. Continue with current plan of care.     Function:    Cognition Comprehension Comprehension assist level: Follows basic conversation/direction with extra time/assistive device  Expression   Expression assist level: Expresses basic needs/ideas: With extra time/assistive device  Social Interaction Social Interaction assist level: Interacts appropriately 75 - 89% of the time - Needs redirection for appropriate language or to initiate interaction.  Problem Solving Problem solving assist level: Solves basic 75 - 89% of the time/requires cueing 10 - 24% of the time  Memory Memory assist level: Recognizes or recalls 50 - 74% of the time/requires cueing 25 - 49% of the time     Pain Pain Assessment Pain Score: 0-No pain  Therapy/Group: Individual Therapy     SLP - Student 11/14/2017, 3:48 PM         

## 2017-11-14 NOTE — Progress Notes (Signed)
Physical Therapy Note  Patient Details  Name: Thomas [name]. MRN: [id number] Date of Birth: [birth year] Today's Date: 11/14/2017  0915-1030, 75 min individual tx Pain: none per pt  Pt seated in recliner.  Min assist for eccentric control when sitting during stand pivot transfer recliner> w/c to R.  Pt requested using his razor. Seated in w/c, pt required mod cues for objects to his L, pt found razor on his L, shaved both sides of his face without cues, and L neck with 1 VC.  W/c propulsion using bil UEs or bil LEs with min assist for steering due to decreased use of LUE and LLE, with minimal improvement with multimodal cues.   Neuromuscular re-education via forced use, multimodal cues for wt shifting to L, L LE mass extension during alternating reciprocal movement bil LEs on Kinetron in sitting and standing, at 30 cm/sec.  Pt has poor wt shifting to L side, and stated his L knee hurts when he extends it.  PT wrapped L knee with ACE to decrease hyperextension, and pt was more willing to shift to L.  Pt has no L trunk lengthening with L single limb stance, and stays flexed at trunk in standing unless cued to extend trunk.  Therapeutic activities in sitting and standing for L visual attention, reaching to L to promote wt shift and L trunk elongation.  Sit>< stand blocked practice without use of UEs to promote loading of LLE, mod assist, and eccentric control through bil LEs during descent.     Gait training on level tile x 55' with RW, min assist, mod cues for upright trunk, forward gaze, with personal L Toeoff AFO. Toeoff did not decrease hyperextension appreciably.   Pt left resting in recliner with quick release belt donned, seat alarm set and wife present.  Wife reported that pt did not wear his personal AFO (which has thigh component also) after previous CVA because it was difficult for him to don and "just too much trouble.". This AFO is 4.5 yrs old, per wife.  Pt may benefit from a different  AFO.   See function navigator for current status.   Rosabella Edgin 11/14/2017, 7:52 AM

## 2017-11-14 NOTE — Evaluation (Signed)
Recreational Therapy Assessment and Plan  Patient Details  Name: Thomas [name]. MRN: [id number] Date of Birth: May 05, 1941 Today's Date: 11/14/2017  Rehab Potential:  Good ELOS:   d/c 4/12  Assessment Problem List:      Patient Active Problem List   Diagnosis Date Noted  . 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 Benign essential HTN   . Coronary artery disease involving native coronary artery of native heart without angina pectoris   . Stage 3 chronic kidney disease ([place])   . Acute lower UTI   . Seasonal allergies   . Stroke ([place]) 11/04/2017  . HLD (hyperlipidemia) 11/04/2017  . Chronic diastolic CHF (congestive heart failure) ([place]) 11/04/2017  . Acute renal failure superimposed on stage 2 chronic kidney disease ([place]) 11/04/2017  . Acute metabolic encephalopathy 40/81/4481  . UTI (urinary tract infection) 11/04/2017  . AKI (acute kidney injury) ([place])   . History of CVA with residual deficit   . Prediabetes   . Left hemiparesis ([place]) 03/19/2013  . Cerebral infarction ([place]) 03/19/2013  . Left leg weakness 03/19/2013  . CAD, ARTERY BYPASS GRAFT 09/30/2008  . 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 Borderline hyperglycemia 12/03/2007  . Other and unspecified hyperlipidemia 07/28/2007  . Essential hypertension 07/28/2007  . 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 Coronary atherosclerosis 07/28/2007  . BENIGN PROSTATIC HYPERTROPHY 07/28/2007  . COLONIC POLYPS, 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    Past Medical History:      Past Medical History:  Diagnosis Date  . CHF (congestive heart failure) ([place]) 1999  . CKD (chronic kidney disease), stage II   . Coronary artery disease   . Hx of colonic polyps   . Hyperlipidemia   . Hyperplastic colon polyp 04/15/2007  . Hypertension   . Prostatic hypertrophy, benign    with elevated PSA  . TIA (transient ischemic attack) 2003   "mini stroke" (03/19/2013)   Past Surgical History:       Past Surgical History:  Procedure  Laterality Date  . CARDIAC CATHETERIZATION    . CATARACT EXTRACTION W/ INTRAOCULAR LENS  IMPLANT, BILATERAL Bilateral 1998  . COLONOSCOPY  2008  . CORONARY ARTERY BYPASS GRAFT  1999   x4  . LOOP RECORDER INSERTION N/A 11/05/2017   Procedure: LOOP RECORDER INSERTION;  Surgeon: Thompson Grayer, MD;  Location: [facility];  Service: Cardiovascular;  Laterality: N/A;  . TEE WITHOUT CARDIOVERSION N/A 11/05/2017   Procedure: TRANSESOPHAGEAL ECHOCARDIOGRAM (TEE) WITH LOOP;  Surgeon: Dorothy Spark, MD;  Location: [facility] ENDOSCOPY;  Service: Cardiovascular;  Laterality: N/A;    Assessment & Plan Clinical Impression: Patient is a 77 y.o.malewith history of CKD, HTN, CHF, prior CVAwith left sided weakness and gait disorder; who was admitted on 11/03/17 with confusion, difficulty walking and visual changes. History taken from chart review, patient, and wife.UDS negative.CT head reviewed, suggesting right PCA infarct. 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 Stroke work up initiated with DAPT recommended for embolic stroke due to unclear source. TEE showed EF 55-60% with lipomatous hypertrophy of atrial septum and no thrombus noted. Loop recorder placed yesterday. BLE dopplers were negative for DVT. He was started on rocephin at admission due to confusion and blood cultures X 2 done--pending. UCS with diphtheroids and antibiotics changed to Keflex today. Patient with resultant Left dense hemiopsia with left neglect (improving)in setting of prior left hemiparesis. Patient transferred to  CIR on 11/06/2017 .   Pt presents with decreased activity tolerance, decreased functional mobility, decreased balance, left inattention, decreased attention,decreased awareness, decreased safety, Limiting pt's independence with leisure/community pursuits.     Plan Min 1 TR session for community reintegration during LOS >60 minutes  Recommendations for other services: None   Discharge Criteria: Patient will be discharged from TR if patient refuses treatment 3 consecutive times without medical reason.  If treatment goals not met, if there is a change in medical status, if patient makes no progress towards goals or if patient is discharged from hospital.  The above assessment, treatment plan, treatment alternatives and goals were discussed and mutually agreed upon: by patient   *Met with pt and wife to discuss the purpose of community reintegration and potential goals.  Pt agreeable to participate in an outing next week.  [place] 11/14/2017, 3:32 PM

## 2017-11-14 NOTE — Progress Notes (Signed)
[facility] PHYSICAL MEDICINE & REHABILITATION     PROGRESS NOTE    Subjective/Complaints: Patient with a reasonable evening.  Slept most of the night.  ROS: Patient denies fever, rash, sore throat, blurred vision, nausea, vomiting, diarrhea, cough, shortness of breath or chest pain, joint or back pain, headache, or mood change. .   Objective: Vital Signs: Blood pressure 121/62, pulse 64, temperature 97.6 F (36.4 C), temperature source Oral, resp. rate 18, height 5\' 11"  (1.803 m), weight 70.1 kg (154 lb 8.7 oz), SpO2 98 %. No results found. Recent Labs    11/13/17 0627  WBC 7.7  HGB 12.9*  HCT 38.8*  PLT 244   Recent Labs    11/13/17 0627  NA 133*  K 4.0  CL 99*  GLUCOSE 112*  BUN 20  CREATININE 1.12  CALCIUM 8.9   CBG (last 3)  No results for input(s): GLUCAP in the last 72 hours.  Wt Readings from Last 3 Encounters:  11/13/17 70.1 kg (154 lb 8.7 oz)  11/05/17 75.5 kg (166 lb 7.2 oz)  10/14/17 76.2 kg (168 lb)    Physical Exam:  Constitutional: No distress . Vital signs reviewed. HEENT: EOMI, oral membranes moist Cardiovascular: RRR without murmur. No JVD    Respiratory: CTA Bilaterally without wheezes or rales. Normal effort    GI: BS +, non-tender, non-distended stable exam Musculoskeletal:   No lower extremity edema Neurological: He is alert and oriented to person, place, and time. Reasonable insight and awareness  persistent left inattention and homonymous hemianopsia--unchanged Mild dysarthria.   LUE: 4+/5 proximal to distal, RUE almost 5/5 RLE: 4+ to 5/5 proximal to distal LLE: 4 to 4+/5 proximal to distal  LUE mild ataxia with FTN--motor and dexterity exam stable Senses pain and light touch in all 4  Skin: Warm Psychiatric: Pleasant and cooperative    Assessment/Plan: 1. Functional deficits  secondary to right PCA infarct which require 3+ hours per day of interdisciplinary therapy in a comprehensive inpatient rehab setting. Physiatrist is  providing close team supervision and 24 hour management of active medical problems listed below. Physiatrist and rehab team continue to assess barriers to discharge/monitor patient progress toward functional and medical goals.  Function:  Bathing Bathing position   Position: Shower  Bathing parts Body parts bathed by patient: Right arm, Left lower leg, Left arm, Chest, Abdomen, Front perineal area, Buttocks, Right upper leg, Left upper leg, Right lower leg Body parts bathed by helper: Back  Bathing assist Assist Level: Touching or steadying assistance(Pt > 75%)      Upper Body Dressing/Undressing Upper body dressing   What is the patient wearing?: Pull over shirt/dress     Pull over shirt/dress - Perfomed by patient: Thread/unthread right sleeve, Thread/unthread left sleeve, Put head through opening, Pull shirt over trunk          Upper body assist Assist Level: Set up, Supervision or verbal cues   Set up : To obtain clothing/put away  Lower Body Dressing/Undressing Lower body dressing   What is the patient wearing?: Pants, Shoes Underwear - Performed by patient: Thread/unthread right underwear leg, Thread/unthread left underwear leg, Pull underwear up/down   Pants- Performed by patient: Thread/unthread right pants leg, Thread/unthread left pants leg, Pull pants up/down       Socks - Performed by patient: Don/doff right sock, Don/doff left sock Socks - Performed by helper: Don/doff right sock, Don/doff left sock Shoes - Performed by patient: Don/doff right shoe, Don/doff left shoe, Fasten right, [employer]  left            Lower body assist Assist for lower body dressing: Touching or steadying assistance (Pt > 75%)      Toileting Toileting   Toileting steps completed by patient: Adjust clothing prior to toileting, Performs perineal hygiene, Adjust clothing after toileting Toileting steps completed by helper: Adjust clothing after toileting Toileting Assistive Devices:  Grab bar or rail  Toileting assist Assist level: Touching or steadying assistance (Pt.75%)   Transfers Chair/bed transfer   Chair/bed transfer method: Stand pivot Chair/bed transfer assist level: Touching or steadying assistance (Pt > 75%) Chair/bed transfer assistive device: Patent attorney     Max distance: 55 Assist level: Touching or steadying assistance (Pt > 75%)   Wheelchair   Type: Manual Max wheelchair distance: 150 Assist Level: Touching or steadying assistance (Pt > 75%)  Cognition Comprehension Comprehension assist level: Follows basic conversation/direction with extra time/assistive device  Expression Expression assist level: Expresses basic needs/ideas: With extra time/assistive device  Social Interaction Social Interaction assist level: Interacts appropriately 75 - 89% of the time - Needs redirection for appropriate language or to initiate interaction.  Problem Solving Problem solving assist level: Solves basic 75 - 89% of the time/requires cueing 10 - 24% of the time  Memory Memory assist level: Recognizes or recalls 50 - 74% of the time/requires cueing 25 - 49% of the time   Medical Problem List and Plan: 1. 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   -continue therapies, CIR level PT OT, SLP.    -Will need family education 2.  DVT Prophylaxis/Anticoagulation: Pharmaceutical: Lovenox 3. Pain Management: tylenol prn 4. Mood: LCSW to follow for evaluation and support.  5. Neuropsych: This patient is capable of making decisions on his own behalf. 6. Skin/Wound Care: routine pressure relief measures 7. Fluids/Electrolytes/Nutrition: intake remains excellent    8. HTN: Monitor BID. Continue  Vitals:   11/14/17 0800 11/14/17 1500  BP: 118/61 121/62  Pulse: 84 64  Resp:  18  Temp:  97.6 F (36.4 C)  SpO2:  98%  Controlled 4/4 9. CAD s/p CABG: On lipitor  and ASA, no chest pain 10 CKD stage III: SCr- 1.8. Cr 1.13 3/28 11. UTI: Cx showed diptheroids (?contaminant)   12. Seasonal allergies v/s URI: Encourage fluid intake. Continue Claritin.    - flonase added for nasal congestion with improvement of symptoms..    LOS (Days) 8 A FACE TO FACE EVALUATION WAS PERFORMED  Ranelle Oyster, MD 11/14/2017 4:33 PM

## 2017-11-14 NOTE — Progress Notes (Signed)
Physical Therapy Session Note  Patient Details  Name: Thomas [name]. MRN: [id number] Date of Birth: [birth year]  Today's Date: 11/13/2017 PT Individual Time:1605-1630 25 min   Short Term Goals: Week 1:  PT Short Term Goal 1 (Week 1): STG=LTG due to ELOS  Skilled Therapeutic Interventions/Progress Updates:   Pt received sitting in recliner and agreeable to PT. PT treatment focused on functional gait training. PT instructed pt in gait training with RW x 170f, 1581f and 10082fmin-supervision assist from PT with min-mod cues for awareness of obstacles on the L as well as improved knee control on the L. Only mild improvement in L knee control to prevent Genu recurvatum in stance. Patient returned to room and left sitting in recliner with call bell in reach and all needs met.         Therapy Documentation Precautions:  Precautions Precautions: Fall Precaution Comments: L hemianopsia Restrictions Weight Bearing Restrictions: No Vital Signs: Therapy Vitals Temp: 98.5 F (36.9 C) Temp Source: Oral Pulse Rate: 74 Resp: 20 BP: (!) 139/115 Patient Position (if appropriate): Lying Oxygen Therapy SpO2: 99 % O2 Device: Room Air Pain: Denies.   See Function Navigator for Current Functional Status.   Therapy/Group: Individual Therapy  AusLorie Phenix4/2019, 6:01 AM

## 2017-11-14 NOTE — Progress Notes (Signed)
Occupational Therapy Session Note  Patient Details  Name: Thomas [name]. MRN: [id number] Date of Birth: [birth year]  Today's Date: 11/14/2017 OT Individual Time: 5374-8270 OT Individual Time Calculation (min): 70 min    Short Term Goals: Week 1:  OT Short Term Goal 1 (Week 1): STGs=LTGs due to ELOS  Skilled Therapeutic Interventions/Progress Updates:    Upon entering the room, pt supine in bed awaiting therapist. Pt standing from bed with steady assistance and ambulating to bathroom. Pt standing to urinate with steady assistance for safety and management of clothing items. Pt seated on EOB to don clothing items and having to take step away from bed in order to provide pt with more feedback on balance secondary to posterior lean. Pt required max cuing for weight shifting and unable to correct himself without assistance. Clothing items places on L side for visual scanning and increased time needed to locate with min verbal cues. OT asking pt what his deficits were and he states, " I know I need to start looking to the left". Pt seated in recliner chair with chair alarm on and quick release belt donned. Pt able to open containers on tray and locate items on L side with min verbal guidance. Call bell and all needed items within reach.   Therapy Documentation Precautions:  Precautions Precautions: Fall Precaution Comments: L hemianopsia Restrictions Weight Bearing Restrictions: No General:   Vital Signs:   Pain:   ADL: ADL ADL Comments: See functional navigator Vision   Perception    Praxis   Exercises:   Other Treatments:    See Function Navigator for Current Functional Status.   Therapy/Group: Individual Therapy  Alen Bleacher 11/14/2017, 12:52 PM

## 2017-11-15 ENCOUNTER — Inpatient Hospital Stay (HOSPITAL_COMMUNITY): Payer: Medicare Other | Admitting: Physical Therapy

## 2017-11-15 ENCOUNTER — Inpatient Hospital Stay (HOSPITAL_COMMUNITY): Payer: Medicare Other | Admitting: Speech Pathology

## 2017-11-15 ENCOUNTER — Inpatient Hospital Stay (HOSPITAL_COMMUNITY): Payer: Medicare Other | Admitting: Occupational Therapy

## 2017-11-15 MED ORDER — HYDROCERIN EX CREA
TOPICAL_CREAM | CUTANEOUS | Status: DC | PRN
Start: 2017-11-15 — End: 2017-11-22
  Filled 2017-11-15: qty 113

## 2017-11-15 NOTE — Progress Notes (Signed)
Occupational Therapy Session Note  Patient Details  Name: Thomas [name]. MRN: [id number] Date of Birth: 03/28/1941  Today's Date: 11/15/2017 OT Individual Time: 1101-1158 OT Individual Time Calculation (min): 57 min   Short Term Goals: Week 1:  OT Short Term Goal 1 (Week 1): STGs=LTGs due to ELOS  Skilled Therapeutic Interventions/Progress Updates:    Pt greeted in recliner with spouse present. Amenable to shower. Tx focus on dynamic balance, Lt attention, and cognitive remediation during bathing and dressing tasks. Pt transferred to TTB using RW with Min A at ambulatory level. Mod cues for walker safety required throughout session. ADL items placed on Lt side and he required max vcs to scan and find them. Sit<stands completed with steady assist using RW. Pt often shouts and becomes easily frustrated, however, able to complete these tasks with steady assist, calming cues, and encouragement. At end of session pt was left in recliner with all needs within reach.   Therapy Documentation Precautions:  Precautions Precautions: Fall Precaution Comments: L hemianopsia Restrictions Weight Bearing Restrictions: No Pain: No c/o pain during session  Pain Assessment Pain Scale: 0-10 Pain Score: 0-No pain ADL: ADL ADL Comments: See functional navigator     See Function Navigator for Current Functional Status.   Therapy/Group: Individual Therapy  Fatimah Sundquist A Damilola Flamm 11/15/2017, 12:28 PM

## 2017-11-15 NOTE — Progress Notes (Signed)
[facility] PHYSICAL MEDICINE & REHABILITATION     PROGRESS NOTE    Subjective/Complaints: Patient without new issues.  Up with therapy.  Denies any pain.  ROS: Patient denies fever, rash, sore throat, blurred vision, nausea, vomiting, diarrhea, cough, shortness of breath or chest pain, joint or back pain, headache, or mood change.   Objective: Vital Signs: Blood pressure (!) 159/73, pulse 73, temperature (!) 97.5 F (36.4 C), temperature source Oral, resp. rate 12, height 5\' 11"  (1.803 m), weight 70.1 kg (154 lb 8.7 oz), SpO2 99 %. No results found. Recent Labs    11/13/17 0627  WBC 7.7  HGB 12.9*  HCT 38.8*  PLT 244   Recent Labs    11/13/17 0627  NA 133*  K 4.0  CL 99*  GLUCOSE 112*  BUN 20  CREATININE 1.12  CALCIUM 8.9   CBG (last 3)  No results for input(s): GLUCAP in the last 72 hours.  Wt Readings from Last 3 Encounters:  11/13/17 70.1 kg (154 lb 8.7 oz)  11/05/17 75.5 kg (166 lb 7.2 oz)  10/14/17 76.2 kg (168 lb)    Physical Exam:  Constitutional: No distress . Vital signs reviewed. HEENT: EOMI, oral membranes moist Cardiovascular: RRR without murmur. No JVD    Respiratory: CTA Bilaterally without wheezes or rales. Normal effort    GI: BS +, non-tender, non-distended  Musculoskeletal:   No limb edema.  Neurological: He is alert and oriented to person, place, and time.  Fair insight and awareness  persistent left inattention and homonymous hemianopsia--unchanged Mild dysarthria.   LUE: 4+/5 proximal to distal, RUE almost 5/5 RLE: 4+ to 5/5 proximal to distal LLE: 4 to 4+/5 proximal to distal  LUE mild ataxia with FTN--no motor changes Senses pain and light touch in all 4  Skin: Warm Psychiatric: Pleasant and cooperative    Assessment/Plan: 1. Functional deficits  secondary to right PCA infarct which require 3+ hours per day of interdisciplinary therapy in a comprehensive inpatient rehab setting. Physiatrist is providing close team supervision  and 24 hour management of active medical problems listed below. Physiatrist and rehab team continue to assess barriers to discharge/monitor patient progress toward functional and medical goals.  Function:  Bathing Bathing position   Position: Shower  Bathing parts Body parts bathed by patient: Right arm, Left lower leg, Left arm, Chest, Abdomen, Front perineal area, Buttocks, Right upper leg, Left upper leg, Right lower leg Body parts bathed by helper: Back  Bathing assist Assist Level: Touching or steadying assistance(Pt > 75%)      Upper Body Dressing/Undressing Upper body dressing   What is the patient wearing?: Pull over shirt/dress     Pull over shirt/dress - Perfomed by patient: Thread/unthread right sleeve, Thread/unthread left sleeve, Put head through opening, Pull shirt over trunk          Upper body assist Assist Level: Set up, Supervision or verbal cues   Set up : To obtain clothing/put away  Lower Body Dressing/Undressing Lower body dressing   What is the patient wearing?: Pants, Shoes Underwear - Performed by patient: Thread/unthread right underwear leg, Thread/unthread left underwear leg, Pull underwear up/down   Pants- Performed by patient: Thread/unthread right pants leg, Thread/unthread left pants leg, Pull pants up/down       Socks - Performed by patient: Don/doff right sock, Don/doff left sock Socks - Performed by helper: Don/doff right sock, Don/doff left sock Shoes - Performed by patient: Don/doff right shoe, Don/doff left shoe, Fasten right, [employer]  left            Lower body assist Assist for lower body dressing: Touching or steadying assistance (Pt > 75%)      Toileting Toileting   Toileting steps completed by patient: Adjust clothing prior to toileting, Performs perineal hygiene, Adjust clothing after toileting Toileting steps completed by helper: Adjust clothing after toileting Toileting Assistive Devices: Grab bar or rail  Toileting assist  Assist level: Touching or steadying assistance (Pt.75%)   Transfers Chair/bed transfer   Chair/bed transfer method: Stand pivot Chair/bed transfer assist level: Touching or steadying assistance (Pt > 75%) Chair/bed transfer assistive device: Patent attorney     Max distance: 55 Assist level: Touching or steadying assistance (Pt > 75%)   Wheelchair   Type: Manual Max wheelchair distance: 150 Assist Level: Touching or steadying assistance (Pt > 75%)  Cognition Comprehension Comprehension assist level: Follows basic conversation/direction with extra time/assistive device  Expression Expression assist level: Expresses complex ideas: With extra time/assistive device  Social Interaction Social Interaction assist level: Interacts appropriately 90% of the time - Needs monitoring or encouragement for participation or interaction.  Problem Solving Problem solving assist level: Solves basic 50 - 74% of the time/requires cueing 25 - 49% of the time  Memory Memory assist level: Recognizes or recalls 50 - 74% of the time/requires cueing 25 - 49% of the time   Medical Problem List and Plan: 1. 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   -continue therapies, CIR level PT OT, SLP.    -  family education needed 2.  DVT Prophylaxis/Anticoagulation: Pharmaceutical: Lovenox 3. Pain Management: tylenol prn 4. Mood: LCSW to follow for evaluation and support.  5. Neuropsych: This patient is capable of making decisions on his own behalf. 6. Skin/Wound Care: routine pressure relief measures 7. Fluids/Electrolytes/Nutrition: intake remains excellent    8. HTN:   Vitals:   11/14/17 2036 11/15/17 0500  BP: 124/68 (!) 159/73  Pulse: 65 73  Resp:  12  Temp:  (!) 97.5 F (36.4 C)  SpO2:  99%  Fair control 4/5--continue to monitor 9. CAD s/p CABG: On lipitor and ASA, no chest pain 10 CKD stage III:  SCr- 1.8. Cr 1.13 3/28 11. UTI: Cx showed diptheroids (?contaminant)   12. Seasonal allergies v/s URI: Encourage fluid intake. Continue Claritin.    - flonase added for nasal congestion with improvement of symptoms..    LOS (Days) 9 A FACE TO FACE EVALUATION WAS PERFORMED  Ranelle Oyster, MD 11/15/2017 10:35 AM

## 2017-11-15 NOTE — Progress Notes (Signed)
Physical Therapy Weekly Progress Note  Patient Details  Name: Thomas [name]. MRN: [id number] Date of Birth: 1940/10/15  Beginning of progress report period: November 07, 2017 End of progress report period: November 15, 2017  Today's Date: 11/15/2017 PT Individual Time: 1030-1100 PT Individual Time Calculation (min): 30 min   Pt agreeable to therapy with no c/o pain. Session focused on floor transfer goal per wife's request. Pt performs squat pivot transfers with min A this session.  Pt lowers himself to floor with min A.  Pt able to perform floor transfer with multiple attempts, max cuing and min/mod A.  Pt then requests to urinate. Pt able to stand to urinate with min guard, improved balance and decreased posterior lean standing with 1 UE support.  Pt left in room with alarm on, needs at hand, wife present.  Patient is progressing towards long term goals and is now min A with transfers, gait and mobility.  Patient continues to demonstrate the following deficits muscle weakness, decreased coordination and decreased motor planning, decreased attention, decreased awareness, decreased safety awareness, decreased memory and delayed processing and decreased standing balance, decreased postural control and decreased balance strategies and therefore will continue to benefit from skilled PT intervention to increase functional independence with mobility.  Patient progressing toward long term goals..  Continue plan of care.  PT Short Term Goals Week 1:  PT Short Term Goal 1 (Week 1): STG=LTG due to ELOS Week 2:  PT Short Term Goal 1 (Week 2): =LTG  Skilled Therapeutic Interventions/Progress Updates:  Ambulation/gait training;Balance/vestibular training;Cognitive remediation/compensation;Community reintegration;Discharge planning;Disease management/prevention;DME/adaptive equipment instruction;Functional electrical stimulation;Functional mobility training;Patient/family education;Psychosocial support;Pain  management;Neuromuscular re-education;Skin care/wound management;Splinting/orthotics;Therapeutic Exercise;Therapeutic Activities;Stair training;UE/LE Strength taining/ROM;Visual/perceptual remediation/compensation;UE/LE Coordination activities;Wheelchair propulsion/positioning   Therapy Documentation Precautions:  Precautions Precautions: Fall Precaution Comments: L hemianopsia Restrictions Weight Bearing Restrictions: No Pain: Pain Assessment Pain Scale: 0-10 Pain Score: 0-No pain   See Function Navigator for Current Functional Status.  Therapy/Group: Individual Therapy  Yuriana Gaal 11/15/2017, 10:58 AM

## 2017-11-15 NOTE — Progress Notes (Signed)
Physical Therapy Session Note  Patient Details  Name: Thomas [name]. MRN: [id number] Date of Birth: [birth year]  Today's Date: 11/15/2017 PT Individual Time: 1448-1856 PT Individual Time Calculation (min): 43 min   Short Term Goals: Week 2:  PT Short Term Goal 1 (Week 2): =LTG  Skilled Therapeutic Interventions/Progress Updates:  Pt received in recliner & agreeable to tx, denying c/o pain. Pt transfers out of recliner and ambulates to BI gym with RW & min assist demonstrating L knee hyperextension. Pt utilizes dynavision in standing, forwards facing and laterally facing board with task focusing on L attention & scanning. Pt reports increased difficulty when standing facing parallel to board. Pt does require seated rest breaks between each trial 2/2 fatigue. Gait with two different sized heel wedges to attempt to minimize L knee hyperextension but no difference noted & primary PT made aware. Pt returned to room & left in care of NT.   Pt's wife present for session. Asked her if she would like to see pt complete floor transfer but she reports she's seen him do this before coming into hospital.    During session pt with very poor eccentric control with stand>sit & educated pt on fall risk when sitting in light weight chair and wife reports he has fallen in this manner before.  Therapy Documentation Precautions:  Precautions Precautions: Fall Precaution Comments: L hemianopsia Restrictions Weight Bearing Restrictions: No   See Function Navigator for Current Functional Status.   Therapy/Group: Individual Therapy  Sandi Mariscal 11/15/2017, 3:14 PM

## 2017-11-15 NOTE — Plan of Care (Signed)
  Problem: RH BLADDER ELIMINATION Goal: RH STG MANAGE BLADDER WITH ASSISTANCE Description STG Manage Bladder With Assistance. Mod I  Outcome: Progressing   Problem: RH SAFETY Goal: RH STG ADHERE TO SAFETY PRECAUTIONS W/ASSISTANCE/DEVICE Description STG Adhere to Safety Precautions With Assistance/Device. Min  Outcome: Progressing   

## 2017-11-15 NOTE — Progress Notes (Signed)
Speech Language Pathology Daily Session Note  Patient Details  Name: Thomas [name]. MRN: [id number] Date of Birth: 08/05/1941  Today's Date: 11/15/2017 SLP Individual Time: 0730-0830 SLP Individual Time Calculation (min): 60 min  Short Term Goals: Week 2: SLP Short Term Goal 1 (Week 2): Pt will utilize external memory aids to recall daily information with Min A verbal cues.  SLP Short Term Goal 2 (Week 2): Pt will scan to left of midline with Min A verbal cues to locate items in 75% of opportunities.  SLP Short Term Goal 3 (Week 2): Pt will demonstrate selective attention for ~60 minutes in a moderately distracting environment with Supervision verbal cues.  SLP Short Term Goal 4 (Week 2): Pt will self-monitor and correct errors during functional tasks with Min A verbal cues.  SLP Short Term Goal 5 (Week 2): Pt will complete basic problem solving tasks with Min A verbal cues.   Skilled Therapeutic Interventions: Skilled treatment session focused on cognitive goals. SLP facilitated session by providing Mod A verbal cues for problem solving and attention to left field of environment during a 4-step picture sequencing task. Pt required Min A verbal cues to self-monitor and correct errors throughout task. Pt demonstrated selective attention in a mildly distracting environment for ~60 minutes with Supervision verbal cues. Pt left upright in recliner with quick release belt in place, alarm on, and all needs within reach. Continue with current plan of care.      Function:  Cognition Comprehension Comprehension assist level: Follows basic conversation/direction with extra time/assistive device  Expression   Expression assist level: Expresses complex ideas: With extra time/assistive device  Social Interaction Social Interaction assist level: Interacts appropriately 90% of the time - Needs monitoring or encouragement for participation or interaction.  Problem Solving Problem solving assist level:  Solves basic 50 - 74% of the time/requires cueing 25 - 49% of the time  Memory Memory assist level: Recognizes or recalls 50 - 74% of the time/requires cueing 25 - 49% of the time    Pain Pain Assessment Pain Scale: 0-10 Pain Score: 0-No pain  Therapy/Group: Individual Therapy  Roxan Hockey  SLP - Student 11/15/2017, 9:43 AM

## 2017-11-16 ENCOUNTER — Inpatient Hospital Stay (HOSPITAL_COMMUNITY): Payer: Medicare Other | Admitting: Speech Pathology

## 2017-11-16 ENCOUNTER — Inpatient Hospital Stay (HOSPITAL_COMMUNITY): Payer: Medicare Other | Admitting: Physical Therapy

## 2017-11-16 NOTE — Progress Notes (Signed)
Physical Therapy Session Note  Patient Details  Name: Thomas [name]. MRN: [id number] Date of Birth: [birth year]  Today's Date: 11/16/2017 PT Individual Time: 0800-0900 PT Individual Time Calculation (min): 60 min   Short Term Goals: Week 1:  PT Short Term Goal 1 (Week 1): STG=LTG due to ELOS  Skilled Therapeutic Interventions/Progress Updates:  Pt was seen bedside in the am. Pt performed multiple sit to stand transfers with min guard A and verbal cues with rolling walker. Pt propelled w/c about 150 feet with B UEs and LEs, S with multiple verbal cues and increased time. In gym treatment focused on NMR utiizing  cone taps and alternating cone taps, 3 sets x 10 reps each.  Pt performed multiple sit to stand and stand pivot transfers with min guard and verbal cues utilizing rolling walker. Pt requires frequent cueing due to L inattention. Pt ambulated 125 feet with rolling walker and min guard to min A with L genu recurvatum noted. Attempted ace wrap to DF foot for about 10 feet with rolling walker and no change in recurvatum. Following treatment pt returned to room and left sitting up in recliner with quick release belt in place and pt's wife at bedside.   Therapy Documentation Precautions:  Precautions Precautions: Fall Precaution Comments: L hemianopsia Restrictions Weight Bearing Restrictions: No General:   Pain: No c/o pain.   See Function Navigator for Current Functional Status.   Therapy/Group: Individual Therapy  Rayford Halsted 11/16/2017, 12:02 PM

## 2017-11-16 NOTE — Plan of Care (Signed)
  Problem: RH BLADDER ELIMINATION Goal: RH STG MANAGE BLADDER WITH ASSISTANCE Description STG Manage Bladder With Assistance. Mod I  Incontinent at HS Outcome: Not Progressing

## 2017-11-16 NOTE — Progress Notes (Signed)
Patient ID: Thomas Alba., male   DOB: December 01, 1940, 77 y.o.   MRN: [id number]   Thomas Laudon. is a 77 y.o. male who is admitted for CIR following right PCA infarct    Subjective: No new complaints. No new problems. Slept well. Feeling OK.   Past Medical History:  Diagnosis Date  . CHF (congestive heart failure) (HCC) 1999  . CKD (chronic kidney disease), stage II   . Coronary artery disease   . Hx of colonic polyps   . Hyperlipidemia   . Hyperplastic colon polyp 04/15/2007  . Hypertension   . Prostatic hypertrophy, benign    with elevated PSA  . TIA (transient ischemic attack) 2003   "mini stroke" (03/19/2013)     Objective: Vital signs in last 24 hours: Temp:  [97.6 F (36.4 C)-97.9 F (36.6 C)] 97.6 F (36.4 C) (04/06 0254) Pulse Rate:  [60-76] 76 (04/06 0254) Resp:  [17-18] 18 (04/06 0254) BP: (127-164)/(65-84) 132/83 (04/06 0254) SpO2:  [90 %-97 %] 90 % (04/06 0254) Weight change:  Last BM Date: 11/14/17(per patient)  Intake/Output from previous day: 04/05 0701 - 04/06 0700 In: 960 [P.O.:960] Out: 625 [Urine:625] Last cbgs: CBG (last 3)  No results for input(s): GLUCAP in the last 72 hours.   Physical Exam General: No apparent distress sitting comfortably in wheelchair HEENT: Negative Lungs: Normal effort. Lungs clear to auscultation, no crackles or wheezes. Cardiovascular: Regular rate and rhythm, no edema Abdomen: S/NT/ND; BS(+) Musculoskeletal:  unchanged Neurological: No new neurological deficits with left HH and persistent left inattention Extremities.  No edema Skin: clear warm without rash Mental state: Alert, oriented, cooperative  Patient Vitals for the past 24 hrs:  BP Temp Temp src Pulse Resp SpO2  11/16/17 0254 132/83 97.6 F (36.4 C) Oral 76 18 90 %  11/15/17 2108 (!) 164/84 - - 72 - -  11/15/17 1420 127/65 97.9 F (36.6 C) Oral 60 17 97 %  .  Blood pressure controlled this a.m.  No change in therapy  Lab Results: BMET     Component Value Date/Time   NA 133 (L) 11/13/2017 0627   K 4.0 11/13/2017 0627   CL 99 (L) 11/13/2017 0627   CO2 24 11/13/2017 0627   GLUCOSE 112 (H) 11/13/2017 0627   BUN 20 11/13/2017 0627   CREATININE 1.12 11/13/2017 0627   CALCIUM 8.9 11/13/2017 0627   GFRNONAA >60 11/13/2017 0627   GFRAA >60 11/13/2017 0627   CBC    Component Value Date/Time   WBC 7.7 11/13/2017 0627   RBC 4.19 (L) 11/13/2017 0627   HGB 12.9 (L) 11/13/2017 0627   HCT 38.8 (L) 11/13/2017 0627   PLT 244 11/13/2017 0627   MCV 92.6 11/13/2017 0627   MCH 30.8 11/13/2017 0627   MCHC 33.2 11/13/2017 0627   RDW 13.3 11/13/2017 0627   LYMPHSABS 2.9 11/07/2017 0651   MONOABS 0.7 11/07/2017 0651   EOSABS 0.6 11/07/2017 0651   BASOSABS 0.0 11/07/2017 0651     Medications: I have reviewed the patient's current medications.  Assessment/Plan:  Status post right PCA infarct with functional deficits.  Continue CIR with PT OT and SLP Essential hypertension.  Blood pressure controlled this a.m.  No change in therapy CAD stable status post CABG Chronic kidney disease   Length of stay, days: 10  Rogelia Boga , MD 11/16/2017, 9:46 AM

## 2017-11-16 NOTE — Progress Notes (Signed)
Speech Language Pathology Daily Session Note  Patient Details  Name: Thomas [name]. MRN: [id number] Date of Birth: 1941-07-06  Today's Date: 11/16/2017 SLP Individual Time: 0930-1030 SLP Individual Time Calculation (min): 60 min  Short Term Goals: Week 2: SLP Short Term Goal 1 (Week 2): Pt will utilize external memory aids to recall daily information with Min A verbal cues.  SLP Short Term Goal 2 (Week 2): Pt will scan to left of midline with Min A verbal cues to locate items in 75% of opportunities.  SLP Short Term Goal 3 (Week 2): Pt will demonstrate selective attention for ~60 minutes in a moderately distracting environment with Supervision verbal cues.  SLP Short Term Goal 4 (Week 2): Pt will self-monitor and correct errors during functional tasks with Min A verbal cues.  SLP Short Term Goal 5 (Week 2): Pt will complete basic problem solving tasks with Min A verbal cues.   Skilled Therapeutic Interventions: Skilled treatment session focused on cognition goals. SLP facilitated session by providing superivison cues for transfer from recliner to wheelchair. SLP further facilitated session by providing Max A cues to complete PEG replication task with specific cues for the left. Pt required more than a reasonable to able to deal 2 separate piles of cards. Pt also required ModA cues to play novel card game centered on left for left attention to locate cards. Pt was returned to room, left upright in wheelchair with wife and nursing was entering room to help with toileting. Continue per current plan of care.       Function:    Cognition Comprehension Comprehension assist level: Follows basic conversation/direction with extra time/assistive device  Expression   Expression assist level: Expresses complex ideas: With extra time/assistive device;Expresses complex 90% of the time/cues < 10% of the time  Social Interaction Social Interaction assist level: Interacts appropriately 90% of the time  - Needs monitoring or encouragement for participation or interaction.  Problem Solving Problem solving assist level: Solves basic 50 - 74% of the time/requires cueing 25 - 49% of the time  Memory Memory assist level: Recognizes or recalls 75 - 89% of the time/requires cueing 10 - 24% of the time;Recognizes or recalls 90% of the time/requires cueing < 10% of the time    Pain Pain Assessment Pain Scale: 0-10 Pain Score: 0-No pain  Therapy/Group: Individual Therapy  Thomas [name] 11/16/2017, 11:40 AM

## 2017-11-17 ENCOUNTER — Inpatient Hospital Stay (HOSPITAL_COMMUNITY): Payer: Medicare Other | Admitting: Physical Therapy

## 2017-11-17 NOTE — Progress Notes (Signed)
Patient ID: Thomas Alba., male   DOB: [birth year], 77 y.o.   MRN: [id number]   Thomas Marsh. is a 77 y.o. male Who is admitted for CIR with functional deficits following a right PCA infarct   Subjective: No new complaints. No new problems.  Remains stable without concerns or complaints  Past Medical History:  Diagnosis Date  . CHF (congestive heart failure) (HCC) 1999  . CKD (chronic kidney disease), stage II   . Coronary artery disease   . Hx of colonic polyps   . Hyperlipidemia   . Hyperplastic colon polyp 04/15/2007  . Hypertension   . Prostatic hypertrophy, benign    with elevated PSA  . TIA (transient ischemic attack) 2003   "mini stroke" (03/19/2013)     Objective: Vital signs in last 24 hours: Temp:  [97.5 F (36.4 C)-98.5 F (36.9 C)] 97.5 F (36.4 C) (04/07 0127) Pulse Rate:  [67-95] 67 (04/07 0127) Resp:  [16-20] 16 (04/07 0127) BP: (112-134)/(62-79) 131/71 (04/07 0127) SpO2:  [95 %-97 %] 95 % (04/07 0127) Weight change:  Last BM Date: 11/15/17  Intake/Output from previous day: 04/06 0701 - 04/07 0700 In: 480 [P.O.:480] Out: 275 [Urine:275]   Patient Vitals for the past 24 hrs:  BP Temp Temp src Pulse Resp SpO2  11/17/17 0127 131/71 (!) 97.5 F (36.4 C) Oral 67 16 95 %  11/16/17 2049 134/62 98.5 F (36.9 C) Oral 95 20 -  11/16/17 1954 123/79 - - 78 - -  11/16/17 1402 112/63 - - 68 18 97 %    Physical Exam General: No apparent distress sitting comfortably in wheelchair HEENT: Unremarkable Lungs: Normal effort. Lungs clear to auscultation, no crackles or wheezes. Cardiovascular: Regular rate and rhythm, no edema Abdomen: S/NT/ND; BS(+) Musculoskeletal:  unchanged Neurological: No new neurological deficits Wounds: N/A    Skin: clear    Mental state: Alert, oriented, cooperative    Lab Results: BMET    Component Value Date/Time   NA 133 (L) 11/13/2017 0627   K 4.0 11/13/2017 0627   CL 99 (L) 11/13/2017 0627   CO2 24 11/13/2017  0627   GLUCOSE 112 (H) 11/13/2017 0627   BUN 20 11/13/2017 0627   CREATININE 1.12 11/13/2017 0627   CALCIUM 8.9 11/13/2017 0627   GFRNONAA >60 11/13/2017 0627   GFRAA >60 11/13/2017 0627   CBC    Component Value Date/Time   WBC 7.7 11/13/2017 0627   RBC 4.19 (L) 11/13/2017 0627   HGB 12.9 (L) 11/13/2017 0627   HCT 38.8 (L) 11/13/2017 0627   PLT 244 11/13/2017 0627   MCV 92.6 11/13/2017 0627   MCH 30.8 11/13/2017 0627   MCHC 33.2 11/13/2017 0627   RDW 13.3 11/13/2017 0627   LYMPHSABS 2.9 11/07/2017 0651   MONOABS 0.7 11/07/2017 0651   EOSABS 0.6 11/07/2017 0651   BASOSABS 0.0 11/07/2017 0651     Medications: I have reviewed the patient's current medications.  Assessment/Plan:  Functional deficits following right PCA infarct.  Continue CIR Essential hypertension.  Blood pressure remains under excellent control Coronary artery disease.  Status post CABG Chronic kidney disease stable.  Renal indices normal on November 13, 2017    Length of stay, days: 11  Rogelia Boga , MD 11/17/2017, 11:20 AM

## 2017-11-17 NOTE — Progress Notes (Signed)
Occupational Therapy Weekly Progress Note  Patient Details  Name: Thomas [name]. MRN: [id number] Date of Birth: July 03, 1941  Beginning of progress report period: 11/07/17 End of progress report period: 11/16/17  No STGs set at time of evaluation due to short ELOS. Due to pts slow progress, ELOS extended and OT goals downgraded to supervision/cuing overall. Pts progress is affected by cognition, Lt field cut, and hyperextension tendencies L LE from previous CVA. He continues to require steady assist for all functional transfers, using RW at ambulatory level, and for BADL tasks at shower level. Spouse has been present often during sessions, asking questions, and collaborating with OT regarding d/c plans. Continue with POC.    Patient continues to demonstrate the following deficits: muscle weakness, decreased cardiorespiratoy endurance, unbalanced muscle activation and decreased coordination, decreased attention, decreased awareness, decreased problem solving, decreased safety awareness and decreased memory and decreased sitting balance, decreased standing balance, decreased postural control, hemiplegia and decreased balance strategies and therefore will continue to benefit from skilled OT intervention to enhance overall performance with BADL and iADL.  Patient progressing toward long term goals..  Continue plan of care.  OT Short Term Goals Week 1:  OT Short Term Goal 1 (Week 1): STGs=LTGs due to ELOS OT Short Term Goal 1 - Progress (Week 1): Progressing toward goal Week 2:  OT Short Term Goal 1 (Week 2): STGs=LTGs due to ELOS  Therapy Documentation Precautions:  Precautions Precautions: Fall Precaution Comments: L hemianopsia Restrictions Weight Bearing Restrictions: No ADL: ADL ADL Comments: See functional navigator :    See Function Navigator for Current Functional Status.   Therapy/Group: Individual Therapy  Terri Rorrer A Cameshia Cressman 11/17/2017, 7:38 AM

## 2017-11-17 NOTE — Progress Notes (Signed)
Physical Therapy Note  Patient Details  Name: Thomas [name]. MRN: [id number] Date of Birth: 03/09/1941 Today's Date: 11/17/2017    Time: 800-844 44 minutes  1:1 No c/o pain.  Pt performs w/c mobility x 150' with increased time, min/mod cuing for scanning Lt to avoid obstacles and problem solving obstacle negotiation.  Gait with RW 110' x 2 with pt requiring frequent standing rest breaks due to c/o LE fatigue, close supervision for balance, min/mod cuing for Lt attention.  Standing balance task with mod/max A for standing balance for cone taps, min A for standing balance with reaching and squatting tasks with focus on knee control and decreasing posterior lean.  Pt left in recliner with alarm set, needs at hand MD present.   Gaberiel Youngblood 11/17/2017, 8:45 AM

## 2017-11-18 ENCOUNTER — Inpatient Hospital Stay (HOSPITAL_COMMUNITY): Payer: Medicare Other | Admitting: Occupational Therapy

## 2017-11-18 ENCOUNTER — Inpatient Hospital Stay (HOSPITAL_COMMUNITY): Payer: Medicare Other | Admitting: Physical Therapy

## 2017-11-18 ENCOUNTER — Inpatient Hospital Stay (HOSPITAL_COMMUNITY): Payer: Medicare Other

## 2017-11-18 NOTE — Progress Notes (Signed)
Physical Therapy Note  Patient Details  Name: Thomas [name]. MRN: [id number] Date of Birth: 1941-08-08 Today's Date: 11/18/2017    Time: 6578-4696 54 minutes  1:1 No c/o pain.  Gait with RW 110', 75' x 3 with pt's wife providing supervision. Pt's wife educated on giving cues for Lt attention during gait, also educated on energy conservation due to pt's decreased activity tolerance.  Stair negotiation 4 stairs x 2 with min A with use of Rt handrail.  Pt requires cues for safety and technique, min A for eccentric control to descend stairs.  Curb step negotiation with RW and min A, cues for safety and technique.  Pt's wife educated on PT recommendation to add another handrail to stairs at home.  Seated theraband UE exercises 12 x bicep curls, rows, shoulder diagonals, should horiz abd, triceps.  nustep x 8 minutes level 5 for UE/LE strength and endurance.  Pt requires increased cues for safety at end of session due to fatigue, pt/wife educated on this and given strategies for home.  Pt left in room with needs at hand, quick release belt donned.   Juliah Scadden 11/18/2017, 11:27 AM

## 2017-11-18 NOTE — Progress Notes (Signed)
Speech Language Pathology Daily Session Note  Patient Details  Name: Thomas [name]. MRN: [id number] Date of Birth: Sep 03, 1940  Today's Date: 11/18/2017 SLP Individual Time: 1300-1400 SLP Individual Time Calculation (min): 60 min  Short Term Goals: Week 2: SLP Short Term Goal 1 (Week 2): Pt will utilize external memory aids to recall daily information with Min A verbal cues.  SLP Short Term Goal 2 (Week 2): Pt will scan to left of midline with Min A verbal cues to locate items in 75% of opportunities.  SLP Short Term Goal 3 (Week 2): Pt will demonstrate selective attention for ~60 minutes in a moderately distracting environment with Supervision verbal cues.  SLP Short Term Goal 4 (Week 2): Pt will self-monitor and correct errors during functional tasks with Min A verbal cues.  SLP Short Term Goal 5 (Week 2): Pt will complete basic problem solving tasks with Min A verbal cues.   Skilled Therapeutic Interventions:Skilled ST services focused on cognitive skills. SLP facilitated basic problem solving utilizing novel card game, Blink, pt required max A fading to mod A verbal cues for problem solving, max A verbal/visual cues to scan left of midline, mod A verbal cues to monitor/correct errors and mod A verbal cues for recall of rules with visual aid. SLP facilitated sequencing of 4 step cards, pt required max A verbal cues . Pt was left in room with call bell within reach. Recommend to continue skilled ST services.       Function:  Eating Eating                 Cognition Comprehension Comprehension assist level: Follows basic conversation/direction with extra time/assistive device  Expression   Expression assist level: Expresses complex ideas: With extra time/assistive device  Social Interaction Social Interaction assist level: Interacts appropriately 90% of the time - Needs monitoring or encouragement for participation or interaction.  Problem Solving Problem solving assist level:  Solves basic 50 - 74% of the time/requires cueing 25 - 49% of the time  Memory Memory assist level: Recognizes or recalls 50 - 74% of the time/requires cueing 25 - 49% of the time    Pain Pain Assessment Pain Score: 0-No pain  Therapy/Group: Individual Therapy  Vere Diantonio  [facility] 11/18/2017, 3:36 PM

## 2017-11-18 NOTE — Progress Notes (Signed)
Occupational Therapy Session Note  Patient Details  Name: Thomas [name]. MRN: [id number] Date of Birth: August 25, 1940  Today's Date: 11/18/2017 OT Individual Time: 7628-3151 OT Individual Time Calculation (min): 70 min   Short Term Goals: Particia Nearing 2:  OT Short Term Goal 1 (Week 2): STGs=LTGs due to ELOS  Skilled Therapeutic Interventions/Progress Updates:    Pt greeted in recliner, declining shower but agreeable to change into day-clothes. Tx focus on dynamic balance, Lt attention, d/c planning, family education, and functional ambulation with device. Pt completed UB/LB dressing with cues for controlling eccentric control while lowering when transiting to sitting using RW. All items placed on Lt side with min vcs required for scanning. Afterwards took pt and spouse to large tub room. Educated them on use of TTB for shower transfers, as they currently use a stool and have him side-step in. They report that bathroom is too small for device, so he "furniture walks" using vanity and grab bars. We practiced this in simulated environment, with spouse having hands on practice during transfer to TTB. Collaborated with pt and spouse regarding DME needs and d/c plans. Answered these questions and relayed DME and f/u information to SW. Afterwards spouse practiced ambulating down hallway with pt while he was using RW. Spouse provided appropriate safety cues with education. Pt does exhibit exceptionally low frustration tolerance with spouse. We addressed this, and educated pt/spouse that feelings of frustration increase risk of falling. Emphasized for both pt and spouse to be more patient with one another to improve communication. Once we returned to room, provided pt with walker bag to transport items around home. Educated pt and spouse on importance of having him assist with IADLs around the house to preserve meaningful life roles and current functional level. They verbalized understanding. Pt left in w/c with  safety belt fastened, anticipating next therapist.      Therapy Documentation Precautions:  Precautions Precautions: Fall Precaution Comments: L hemianopsia Restrictions Weight Bearing Restrictions: No Pain: No c/o pain during session  Pain Assessment Pain Scale: 0-10 Pain Score: 0-No pain ADL: ADL ADL Comments: See functional navigator     See Function Navigator for Current Functional Status.   Therapy/Group: Individual Therapy  Dariel Pellecchia A Shaquelle Hernon 11/18/2017, 12:18 PM

## 2017-11-18 NOTE — Progress Notes (Signed)
[facility] PHYSICAL MEDICINE & REHABILITATION     PROGRESS NOTE    Subjective/Complaints: No new issues. Sitting comfortably in chair. Had a good weekend.   ROS: Patient denies fever, rash, sore throat, blurred vision, nausea, vomiting, diarrhea, cough, shortness of breath or chest pain, joint or back pain, headache, or mood change.    Objective: Vital Signs: Blood pressure 121/68, pulse 66, temperature (!) 97.5 F (36.4 C), temperature source Oral, resp. rate 18, height 5\' 11"  (1.803 m), weight 70.1 kg (154 lb 8.7 oz), SpO2 99 %. No results found. No results for input(s): WBC, HGB, HCT, PLT in the last 72 hours. No results for input(s): NA, K, CL, GLUCOSE, BUN, CREATININE, CALCIUM in the last 72 hours.  Invalid input(s): CO CBG (last 3)  No results for input(s): GLUCAP in the last 72 hours.  Wt Readings from Last 3 Encounters:  11/13/17 70.1 kg (154 lb 8.7 oz)  11/05/17 75.5 kg (166 lb 7.2 oz)  10/14/17 76.2 kg (168 lb)    Physical Exam:  Constitutional: No distress . Vital signs reviewed. HEENT: EOMI, oral membranes moist Cardiovascular: RRR without murmur. No JVD    Respiratory: CTA Bilaterally without wheezes or rales. Normal effort    GI: BS +, non-tender, non-distended  Musculoskeletal:   No limb edema.  Neurological: He is alert and oriented to person, place, and time.  Fair insight and awareness  persistent left inattention and homonymous hemianopsia--unchanged Mild dysarthria.   LUE: 4+/5 proximal to distal, RUE almost 5/5 RLE: 4+ to 5/5 proximal to distal LLE: 4 to 4+/5 proximal to distal  LUE mild ataxia with FTN--no motor changes Senses pain and light touch in all 4  Skin: Warm Psychiatric: Pleasant and cooperative    Assessment/Plan: 1. Functional deficits  secondary to right PCA infarct which require 3+ hours per day of interdisciplinary therapy in a comprehensive inpatient rehab setting. Physiatrist is providing close team supervision and 24 hour  management of active medical problems listed below. Physiatrist and rehab team continue to assess barriers to discharge/monitor patient progress toward functional and medical goals.  Function:  Bathing Bathing position   Position: Shower  Bathing parts Body parts bathed by patient: Right arm, Left lower leg, Left arm, Chest, Abdomen, Front perineal area, Buttocks, Right upper leg, Left upper leg, Right lower leg Body parts bathed by helper: Back  Bathing assist Assist Level: Touching or steadying assistance(Pt > 75%)      Upper Body Dressing/Undressing Upper body dressing   What is the patient wearing?: Pull over shirt/dress     Pull over shirt/dress - Perfomed by patient: Thread/unthread right sleeve, Thread/unthread left sleeve, Put head through opening, Pull shirt over trunk          Upper body assist Assist Level: Set up, Supervision or verbal cues   Set up : To obtain clothing/put away  Lower Body Dressing/Undressing Lower body dressing   What is the patient wearing?: Pants, Shoes, Non-skid slipper socks, Underwear Underwear - Performed by patient: Thread/unthread right underwear leg, Thread/unthread left underwear leg, Pull underwear up/down   Pants- Performed by patient: Thread/unthread right pants leg, Thread/unthread left pants leg, Pull pants up/down   Non-skid slipper socks- Performed by patient: Don/doff right sock, Don/doff left sock   Socks - Performed by patient: Don/doff right sock, Don/doff left sock Socks - Performed by helper: Don/doff right sock, Don/doff left sock Shoes - Performed by patient: Don/doff right shoe, Don/doff left shoe, Fasten right, Fasten left  Lower body assist Assist for lower body dressing: Touching or steadying assistance (Pt > 75%)      Toileting Toileting   Toileting steps completed by patient: Adjust clothing prior to toileting, Performs perineal hygiene, Adjust clothing after toileting Toileting steps completed by  helper: Adjust clothing after toileting Toileting Assistive Devices: Grab bar or rail  Toileting assist Assist level: Touching or steadying assistance (Pt.75%)   Transfers Chair/bed transfer   Chair/bed transfer method: Stand pivot Chair/bed transfer assist level: Touching or steadying assistance (Pt > 75%) Chair/bed transfer assistive device: Patent attorney     Max distance: 125 Assist level: Touching or steadying assistance (Pt > 75%)   Wheelchair   Type: Manual Max wheelchair distance: 150 Assist Level: Supervision or verbal cues  Cognition Comprehension Comprehension assist level: Follows basic conversation/direction with extra time/assistive device  Expression Expression assist level: Expresses complex ideas: With extra time/assistive device  Social Interaction Social Interaction assist level: Interacts appropriately 90% of the time - Needs monitoring or encouragement for participation or interaction.  Problem Solving Problem solving assist level: Solves basic 50 - 74% of the time/requires cueing 25 - 49% of the time  Memory Memory assist level: Recognizes or recalls 50 - 74% of the time/requires cueing 25 - 49% of the time   Medical Problem List and Plan: 1. 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   -continue therapies, CIR level PT OT, SLP.      2.  DVT Prophylaxis/Anticoagulation: Pharmaceutical: Lovenox 3. Pain Management: tylenol prn 4. Mood: LCSW to follow for evaluation and support.  5. Neuropsych: This patient is capable of making decisions on his own behalf. 6. Skin/Wound Care: routine pressure relief measures 7. Fluids/Electrolytes/Nutrition: intake remains solid 8. HTN:   Vitals:   11/17/17 2040 11/18/17 0016  BP: 110/71 121/68  Pulse: 71 66  Resp:  18  Temp:  (!) 97.5 F (36.4 C)  SpO2:  99%  Fair control 4/5--continue to monitor 9. CAD s/p CABG:  On lipitor and ASA, no chest pain 10 CKD stage III: SCr- 1.8. Cr 1.13 3/28 11. UTI: Cx showed diptheroids (?contaminant)   12. Seasonal allergies v/s URI: Encourage fluid intake. Continue Claritin.    - flonase added for nasal congestion with improvement of symptoms..    LOS (Days) 12 A FACE TO FACE EVALUATION WAS PERFORMED  Ranelle Oyster, MD 11/18/2017 10:55 AM

## 2017-11-19 ENCOUNTER — Ambulatory Visit: Payer: Medicare Other

## 2017-11-19 ENCOUNTER — Inpatient Hospital Stay (HOSPITAL_COMMUNITY): Payer: Medicare Other | Admitting: Occupational Therapy

## 2017-11-19 ENCOUNTER — Inpatient Hospital Stay (HOSPITAL_COMMUNITY): Payer: Medicare Other | Admitting: Physical Therapy

## 2017-11-19 ENCOUNTER — Encounter (HOSPITAL_COMMUNITY): Payer: Medicare Other | Admitting: *Deleted

## 2017-11-19 ENCOUNTER — Inpatient Hospital Stay (HOSPITAL_COMMUNITY): Payer: Medicare Other | Admitting: Speech Pathology

## 2017-11-19 MED ORDER — ACETAMINOPHEN 325 MG PO TABS: 325.0000 mg | ORAL_TABLET | ORAL | Status: AC | PRN

## 2017-11-19 MED ORDER — SALINE SPRAY 0.65 % NA SOLN: 1.0000 | Freq: Two times a day (BID) | NASAL | 0 refills | Status: DC

## 2017-11-19 MED ORDER — FLUTICASONE PROPIONATE 50 MCG/ACT NA SUSP: 2.0000 | Freq: Every day | NASAL | 2 refills | Status: AC | PRN

## 2017-11-19 MED ORDER — HYDROCERIN EX CREA: 1.0000 "application " | TOPICAL_CREAM | Freq: Two times a day (BID) | CUTANEOUS | 0 refills | Status: DC

## 2017-11-19 NOTE — Progress Notes (Signed)
[facility] PHYSICAL MEDICINE & REHABILITATION     PROGRESS NOTE    Subjective/Complaints: Up with therapy, eating breakfast. No new problems over night  ROS: Patient denies fever, rash, sore throat, blurred vision, nausea, vomiting, diarrhea, cough, shortness of breath or chest pain, joint or back pain, headache, or mood change.    Objective: Vital Signs: Blood pressure 109/71, pulse 68, temperature 97.8 F (36.6 C), temperature source Oral, resp. rate 18, height 5\' 11"  (1.803 m), weight 70.1 kg (154 lb 8.7 oz), SpO2 100 %. No results found. No results for input(s): WBC, HGB, HCT, PLT in the last 72 hours. No results for input(s): NA, K, CL, GLUCOSE, BUN, CREATININE, CALCIUM in the last 72 hours.  Invalid input(s): CO CBG (last 3)  No results for input(s): GLUCAP in the last 72 hours.  Wt Readings from Last 3 Encounters:  11/13/17 70.1 kg (154 lb 8.7 oz)  11/05/17 75.5 kg (166 lb 7.2 oz)  10/14/17 76.2 kg (168 lb)    Physical Exam:  Constitutional: No distress . Vital signs reviewed. HEENT: EOMI, oral membranes moist Cardiovascular: RRR without murmur. No JVD    Respiratory: CTA Bilaterally without wheezes or rales. Normal effort    GI: BS +, non-tender, non-distended  Musculoskeletal:   No limb edema.  Neurological: He is alert and oriented to person, place, and time.  Fair insight and awareness  persistent left inattention and homonymous hemianopsia-persistent---is compensating somewhat for visual loss Mild dysarthria.   LUE: 4+/5 proximal to distal, RUE almost 5/5 RLE: 4+ to 5/5 proximal to distal LLE: 4 to 4+/5 proximal to distal  LUE mild ataxia with FTN--stable motor Senses pain and light touch in all 4  Skin: Warm Psychiatric: Pleasant and cooperative    Assessment/Plan: 1. Functional deficits  secondary to right PCA infarct which require 3+ hours per day of interdisciplinary therapy in a comprehensive inpatient rehab setting. Physiatrist is providing close  team supervision and 24 hour management of active medical problems listed below. Physiatrist and rehab team continue to assess barriers to discharge/monitor patient progress toward functional and medical goals.  Function:  Bathing Bathing position Bathing activity did not occur: Refused Position: Systems developer parts bathed by patient: Right arm, Left lower leg, Left arm, Chest, Abdomen, Front perineal area, Buttocks, Right upper leg, Left upper leg, Right lower leg Body parts bathed by helper: Back  Bathing assist Assist Level: Touching or steadying assistance(Pt > 75%)      Upper Body Dressing/Undressing Upper body dressing   What is the patient wearing?: Pull over shirt/dress     Pull over shirt/dress - Perfomed by patient: Thread/unthread right sleeve, Thread/unthread left sleeve, Put head through opening, Pull shirt over trunk          Upper body assist Assist Level: Set up, Supervision or verbal cues   Set up : To obtain clothing/put away  Lower Body Dressing/Undressing Lower body dressing   What is the patient wearing?: Pants, Shoes, Non-skid slipper socks, Underwear Underwear - Performed by patient: Thread/unthread right underwear leg, Thread/unthread left underwear leg, Pull underwear up/down   Pants- Performed by patient: Thread/unthread right pants leg, Thread/unthread left pants leg, Pull pants up/down   Non-skid slipper socks- Performed by patient: Don/doff right sock, Don/doff left sock   Socks - Performed by patient: Don/doff right sock, Don/doff left sock Socks - Performed by helper: Don/doff right sock, Don/doff left sock Shoes - Performed by patient: Don/doff right shoe, Don/doff left shoe, Fasten right,  Fasten left            Lower body assist Assist for lower body dressing: Supervision or verbal cues      Toileting Toileting   Toileting steps completed by patient: Adjust clothing prior to toileting, Performs perineal hygiene, Adjust  clothing after toileting Toileting steps completed by helper: Adjust clothing after toileting Toileting Assistive Devices: Grab bar or rail  Toileting assist Assist level: Touching or steadying assistance (Pt.75%)   Transfers Chair/bed transfer   Chair/bed transfer method: Stand pivot Chair/bed transfer assist level: Touching or steadying assistance (Pt > 75%) Chair/bed transfer assistive device: Patent attorney     Max distance: 65' Assist level: Touching or steadying assistance (Pt > 75%)   Wheelchair   Type: Manual Max wheelchair distance: 150 Assist Level: Supervision or verbal cues  Cognition Comprehension Comprehension assist level: Follows basic conversation/direction with extra time/assistive device  Expression Expression assist level: Expresses complex ideas: With extra time/assistive device  Social Interaction Social Interaction assist level: Interacts appropriately 90% of the time - Needs monitoring or encouragement for participation or interaction.  Problem Solving Problem solving assist level: Solves basic 50 - 74% of the time/requires cueing 25 - 49% of the time  Memory Memory assist level: Recognizes or recalls 50 - 74% of the time/requires cueing 25 - 49% of the time   Medical Problem List and Plan: 1. 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   -continue therapies, CIR level PT OT, SLP.     -team conference 2.  DVT Prophylaxis/Anticoagulation: Pharmaceutical: Lovenox 3. Pain Management: tylenol prn 4. Mood: LCSW to follow for evaluation and support.  5. Neuropsych: This patient is capable of making decisions on his own behalf. 6. Skin/Wound Care: routine pressure relief measures 7. Fluids/Electrolytes/Nutrition: intake remains good 8. HTN:   Vitals:   11/18/17 2050 11/19/17 0035  BP: 131/81 109/71  Pulse: 72 68  Resp:  18  Temp:  97.8 F (36.6 C)   SpO2:  100%  Fair control 4/9-continue to monitor 9. CAD s/p CABG: On lipitor and ASA, no chest pain 10 CKD stage III: SCr- 1.8. Cr 1.13 3/28 11. UTI: Cx showed diptheroids (?contaminant)   12. Seasonal allergies v/s URI: Encourage fluid intake. Continue Claritin.    - flonase added for nasal congestion with improvement of symptoms..    LOS (Days) 13 A FACE TO FACE EVALUATION WAS PERFORMED  Ranelle Oyster, MD 11/19/2017 8:56 AM

## 2017-11-19 NOTE — Progress Notes (Signed)
Physical Therapy Note  Patient Details  Name: Thomas [name]. MRN: [id number] Date of Birth: [birth year] Today's Date: 11/19/2017    Time: 830-925 55 minutes  1:1 No c/o pain.  Pt performed gait in controlled environment 100' x 4 with RW with close supervision, min cuing for Lt attention and scanning.  Pt performs stair negotiation 4 stairs x 2 with min A with 1 handrail.  Squat and reach task for Lt scanning, balance and LE control with pt improving eccentric quad control and performing with steadying assist.  Seated therex with 2# wts LAQ, HS curl and hip abd/add all 2 x 15.  Pt given [place] HEP for home use, verbalizes understanding. Pt left in room with needs at hand, alarm on.   Sargun Rummell 11/19/2017, 9:25 AM

## 2017-11-19 NOTE — Progress Notes (Signed)
Speech Language Pathology Daily Session Note  Patient Details  Name: Thomas [name]. MRN: [id number] Date of Birth: 06/19/1941  Today's Date: 11/19/2017 SLP Individual Time: 1100-1200 SLP Individual Time Calculation (min): 60 min  Short Term Goals: Week 2: SLP Short Term Goal 1 (Week 2): Pt will utilize external memory aids to recall daily information with Min A verbal cues.  SLP Short Term Goal 2 (Week 2): Pt will scan to left of midline with Min A verbal cues to locate items in 75% of opportunities.  SLP Short Term Goal 3 (Week 2): Pt will demonstrate selective attention for ~60 minutes in a moderately distracting environment with Supervision verbal cues.  SLP Short Term Goal 4 (Week 2): Pt will self-monitor and correct errors during functional tasks with Min A verbal cues.  SLP Short Term Goal 5 (Week 2): Pt will complete basic problem solving tasks with Min A verbal cues.   Skilled Therapeutic Interventions: Skilled treatment session focused on cognitive goals. Upon arrival, pt was upright in recliner. SLP facilitated session by providing Min A verbal cues for problem solving in regards to transfer to wheelchair. SLP further facilitated session by providing Mod A verbal cues for pt to recall the procedures of a previously learned task (Blink). Pt required Mod A verbal cues for problem solving and left attention to left field of environment during the task. Pt also demonstrated selective attention for ~60 minutes in a mildly distracting environment with Supervision verbal cues. At end of session, pt requested to use the bathroom; therefore, SLP transferred pt to commode and then back to recliner. Pt left upright in recliner with quick release belt in place, alarm on, and wife present. Continue with current plan of care.      Function:  Cognition Comprehension Comprehension assist level: Follows basic conversation/direction with extra time/assistive device  Expression   Expression assist  level: Expresses basic needs/ideas: With extra time/assistive device  Social Interaction Social Interaction assist level: Interacts appropriately 90% of the time - Needs monitoring or encouragement for participation or interaction.  Problem Solving Problem solving assist level: Solves basic 75 - 89% of the time/requires cueing 10 - 24% of the time  Memory Memory assist level: Recognizes or recalls 50 - 74% of the time/requires cueing 25 - 49% of the time    Pain Pain Assessment Pain Score: 0-No pain  Therapy/Group: Individual Therapy  Roxan Hockey  SLP - Student 11/19/2017, 1:10 PM

## 2017-11-19 NOTE — Progress Notes (Signed)
Occupational Therapy Session Note  Patient Details  Name: Thomas [name]. MRN: [id number] Date of Birth: 09-12-1940  Today's Date: 11/19/2017 OT Individual Time: 3976-7341 OT Individual Time Calculation (min): 73 min    Short Term Goals: Week 2:  OT Short Term Goal 1 (Week 2): STGs=LTGs due to ELOS  Skilled Therapeutic Interventions/Progress Updates:   Upon entering the room, pt supine in bed awaiting therapist arrival. Pt with no c/o pain and agreeable to OT intervention. Pt donned B socks and shoes with supervision for safety and set up to obtain all needed items. Pt ambulates with RW to sink to don dentures and shave face while standing with close supervision. However, pt does have LOB posteriorly and required mod A to correct with pt initially having no reaction on his own. Pt ambulates to day room with RW and steady assistance. Min cues for forward head and to not walk into various items in hallway on left side. Pt seated at table with breakfast tray and able to set up meal himself with increased time and min cues to locate items on L side of tray. Pt and therapist discuss upcoming discharge and recommended equipment at follow up with pt verbalizing understanding and agreement. Pt returned to room in same manner as above. Pt seated in recliner chair with quick release belt donned and chair alarm activated. Call bell within reach.    Therapy Documentation Precautions:  Precautions Precautions: Fall Precaution Comments: L hemianopsia Restrictions Weight Bearing Restrictions: No Pain: Pain Assessment Pain Score: Asleep ADL: ADL ADL Comments: See functional navigator  See Function Navigator for Current Functional Status.   Therapy/Group: Individual Therapy  Alen Bleacher 11/19/2017, 8:52 AM

## 2017-11-20 ENCOUNTER — Inpatient Hospital Stay (HOSPITAL_COMMUNITY): Payer: Medicare Other | Admitting: Physical Therapy

## 2017-11-20 ENCOUNTER — Inpatient Hospital Stay (HOSPITAL_COMMUNITY): Payer: Medicare Other | Admitting: Speech Pathology

## 2017-11-20 ENCOUNTER — Inpatient Hospital Stay (HOSPITAL_COMMUNITY): Payer: Medicare Other | Admitting: Occupational Therapy

## 2017-11-20 ENCOUNTER — Ambulatory Visit: Payer: Medicare Other

## 2017-11-20 DIAGNOSIS — N401 Enlarged prostate with lower urinary tract symptoms: Secondary | ICD-10-CM

## 2017-11-20 DIAGNOSIS — R338 Other retention of urine: Secondary | ICD-10-CM

## 2017-11-20 LAB — CBC
HCT: 40.1 % (ref 39.0–52.0)
Hemoglobin: 13.7 g/dL (ref 13.0–17.0)
MCH: 31.1 pg (ref 26.0–34.0)
MCHC: 34.2 g/dL (ref 30.0–36.0)
MCV: 90.9 fL (ref 78.0–100.0)
PLATELETS: 283 10*3/uL (ref 150–400)
RBC: 4.41 MIL/uL (ref 4.22–5.81)
RDW: 13 % (ref 11.5–15.5)
WBC: 13.8 10*3/uL — ABNORMAL HIGH (ref 4.0–10.5)

## 2017-11-20 LAB — BASIC METABOLIC PANEL
ANION GAP: 10 (ref 5–15)
BUN: 22 mg/dL — AB (ref 6–20)
CALCIUM: 9.1 mg/dL (ref 8.9–10.3)
CO2: 24 mmol/L (ref 22–32)
CREATININE: 1.15 mg/dL (ref 0.61–1.24)
Chloride: 97 mmol/L — ABNORMAL LOW (ref 101–111)
GFR calc Af Amer: >60 mL/min (ref >60)
GFR, EST NON AFRICAN AMERICAN: 60 mL/min — AB (ref >60)
GLUCOSE: 103 mg/dL — AB (ref 65–99)
Potassium: 4.1 mmol/L (ref 3.5–5.1)
Sodium: 131 mmol/L — ABNORMAL LOW (ref 135–145)

## 2017-11-20 MED ORDER — TAMSULOSIN HCL 0.4 MG PO CAPS
0.4000 mg | ORAL_CAPSULE | Freq: Every day | ORAL | Status: DC
  Administered 2017-11-20 – 2017-11-21 (×2): 0.4 mg via ORAL
  Filled 2017-11-20 (×2): qty 1

## 2017-11-20 NOTE — Progress Notes (Signed)
Speech Language Pathology Daily Session Note  Patient Details  Name: Thomas [name]. MRN: [id number] Date of Birth: 1940-11-05  Today's Date: 11/20/2017 SLP Individual Time: 1304-1400 SLP Individual Time Calculation (min): 56 min  Short Term Goals: Week 2: SLP Short Term Goal 1 (Week 2): Pt will utilize external memory aids to recall daily information with Min A verbal cues.  SLP Short Term Goal 2 (Week 2): Pt will scan to left of midline with Min A verbal cues to locate items in 75% of opportunities.  SLP Short Term Goal 3 (Week 2): Pt will demonstrate selective attention for ~60 minutes in a moderately distracting environment with Supervision verbal cues.  SLP Short Term Goal 4 (Week 2): Pt will self-monitor and correct errors during functional tasks with Min A verbal cues.  SLP Short Term Goal 5 (Week 2): Pt will complete basic problem solving tasks with Min A verbal cues.   Skilled Therapeutic Interventions:  Pt was seen for skilled ST targeting cognitive goals.  SLP re-administered the MoCA to measure progress from initial evaluation.  Today, pt scored 22/30 on assessment which is improved from his initial score of 19/30 but is below cut off score.  Deficits are primarily related to visuospatial/executive functioning skills.  Pt was able to identify deficits post CVA and their implications on his functional independence but reports having difficulty putting compensatory strategies into practice.  This is especially evident with visual scanning, per pt report.  When propelling his wheelchair back to his room, pt needed mod assist to avoid obstacles on the left.  Pt needed min verbal cues to apply brakes to wheelchair before transferring to recliner.  Pt left with wife at bedside.  Continue per current plan of care.    Function:  Eating Eating                 Cognition Comprehension Comprehension assist level: Follows basic conversation/direction with extra time/assistive device   Expression   Expression assist level: Expresses basic needs/ideas: With extra time/assistive device  Social Interaction Social Interaction assist level: Interacts appropriately 90% of the time - Needs monitoring or encouragement for participation or interaction.  Problem Solving Problem solving assist level: Solves basic 75 - 89% of the time/requires cueing 10 - 24% of the time  Memory Memory assist level: Recognizes or recalls 75 - 89% of the time/requires cueing 10 - 24% of the time    Pain Pain Assessment Pain Scale: 0-10 Pain Score: 0-No pain  Therapy/Group: Individual Therapy  Punam Broussard, Melanee Spry 11/20/2017, 2:53 PM

## 2017-11-20 NOTE — Progress Notes (Signed)
Occupational Therapy Session Note  Patient Details  Name: Thomas [name]. MRN: [id number] Date of Birth: [birth year]  Today's Date: 11/20/2017 OT Individual Time: 1000-1100 OT Individual Time Calculation (min): 60 min    Short Term Goals: Week 2:  OT Short Term Goal 1 (Week 2): STGs=LTGs due to ELOS  Skilled Therapeutic Interventions/Progress Updates:    Pt seated in recliner chair with wife, [name], present for hands on family education with focus on self care. [name] provided supervision, steady assistance, and verbal cues for safety with functional transfers, ambulation, bathing, and dressing with min verbal cues from therapist for safety. She gave appropriate cues and level of assistance to keep pt safe at all times. Caregiver and pt report feeling more comfortable with "all of this" and OT continues to encourage good communication between them for safety. Pt remaining in recliner chair after dressing with all needs within reach and caregiver remaining in room.   Therapy Documentation Precautions:  Precautions Precautions: Fall Precaution Comments: L hemianopsia Restrictions Weight Bearing Restrictions: No General:   Vital Signs:  Pain:   ADL: ADL ADL Comments: See functional navigator Vision   Perception    Praxis   Exercises:   Other Treatments:    See Function Navigator for Current Functional Status.   Therapy/Group: Individual Therapy  Alen Bleacher 11/20/2017, 12:43 PM

## 2017-11-20 NOTE — Progress Notes (Signed)
[place] PHYSICAL MEDICINE & REHABILITATION     PROGRESS NOTE    Subjective/Complaints: Pt was EOB when I arrived, set off bed alarm, became verbally aggressive when alarm went off out of frustration re: alarm  ROS: Patient denies fever, rash, sore throat, blurred vision, nausea, vomiting, diarrhea, cough, shortness of breath or chest pain, joint or back pain, headache, or mood change.     Objective: Vital Signs: Blood pressure 115/72, pulse 86, temperature (!) 97.5 F (36.4 C), temperature source Oral, resp. rate 17, height 5\' 11"  (1.803 m), weight 70.2 kg (154 lb 12.2 oz), SpO2 97 %. No results found. Recent Labs    11/20/17 0620  WBC 13.8*  HGB 13.7  HCT 40.1  PLT 283   Recent Labs    11/20/17 0620  NA 131*  K 4.1  CL 97*  GLUCOSE 103*  BUN 22*  CREATININE 1.15  CALCIUM 9.1   CBG (last 3)  No results for input(s): GLUCAP in the last 72 hours.  Wt Readings from Last 3 Encounters:  11/20/17 70.2 kg (154 lb 12.2 oz)  11/05/17 75.5 kg (166 lb 7.2 oz)  10/14/17 76.2 kg (168 lb)    Physical Exam:  Constitutional: No distress . Vital signs reviewed. HEENT: EOMI, oral membranes moist Cardiovascular: RRR without murmur. No JVD    Respiratory: CTA Bilaterally without wheezes or rales. Normal effort    GI: BS +, non-tender, non-distended  Musculoskeletal:   No limb edema.  Neurological: He is alert and oriented to person, place, and time.  Fair insight and awareness  persistent left inattention and homonymous hemianopsia-persistent---is compensating somewhat for visual loss---unchanged Mild dysarthria.   LUE: 4+/5 proximal to distal, RUE almost 5/5 RLE: 4+ to 5/5 proximal to distal LLE: 4 to 4+/5 proximal to distal  LUE mild ataxia with FTN--motor stable Senses pain and light touch in all 4  Skin: Warm Psychiatric: irritable    Assessment/Plan: 1. Functional deficits  secondary to right PCA infarct which require 3+ hours per day of interdisciplinary therapy  in a comprehensive inpatient rehab setting. Physiatrist is providing close team supervision and 24 hour management of active medical problems listed below. Physiatrist and rehab team continue to assess barriers to discharge/monitor patient progress toward functional and medical goals.  Function:  Bathing Bathing position Bathing activity did not occur: Refused Position: Systems developer parts bathed by patient: Right arm, Left lower leg, Left arm, Chest, Abdomen, Front perineal area, Buttocks, Right upper leg, Left upper leg, Right lower leg Body parts bathed by helper: Back  Bathing assist Assist Level: Touching or steadying assistance(Pt > 75%)      Upper Body Dressing/Undressing Upper body dressing   What is the patient wearing?: Pull over shirt/dress     Pull over shirt/dress - Perfomed by patient: Thread/unthread right sleeve, Thread/unthread left sleeve, Put head through opening, Pull shirt over trunk          Upper body assist Assist Level: Set up, Supervision or verbal cues   Set up : To obtain clothing/put away  Lower Body Dressing/Undressing Lower body dressing   What is the patient wearing?: Pants, Shoes, Non-skid slipper socks, Underwear Underwear - Performed by patient: Thread/unthread right underwear leg, Thread/unthread left underwear leg, Pull underwear up/down   Pants- Performed by patient: Thread/unthread right pants leg, Thread/unthread left pants leg, Pull pants up/down   Non-skid slipper socks- Performed by patient: Don/doff right sock, Don/doff left sock   Socks - Performed by patient:  Don/doff right sock, Don/doff left sock Socks - Performed by helper: Don/doff right sock, Don/doff left sock Shoes - Performed by patient: Don/doff right shoe, Don/doff left shoe, Fasten right, Fasten left            Lower body assist Assist for lower body dressing: Supervision or verbal cues      Toileting Toileting   Toileting steps completed by  patient: Adjust clothing prior to toileting, Performs perineal hygiene, Adjust clothing after toileting Toileting steps completed by helper: Adjust clothing after toileting Toileting Assistive Devices: Grab bar or rail  Toileting assist Assist level: Touching or steadying assistance (Pt.75%)   Transfers Chair/bed transfer   Chair/bed transfer method: Stand pivot Chair/bed transfer assist level: Touching or steadying assistance (Pt > 75%) Chair/bed transfer assistive device: Patent attorney     Max distance: 64' Assist level: Touching or steadying assistance (Pt > 75%)   Wheelchair   Type: Manual Max wheelchair distance: 150 Assist Level: Supervision or verbal cues  Cognition Comprehension Comprehension assist level: Follows basic conversation/direction with extra time/assistive device  Expression Expression assist level: Expresses basic needs/ideas: With extra time/assistive device  Social Interaction Social Interaction assist level: Interacts appropriately 90% of the time - Needs monitoring or encouragement for participation or interaction.  Problem Solving Problem solving assist level: Solves basic 75 - 89% of the time/requires cueing 10 - 24% of the time  Memory Memory assist level: Recognizes or recalls 50 - 74% of the time/requires cueing 25 - 49% of the time   Medical Problem List and Plan: 1. 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   -continue therapies, CIR level PT OT, SLP.  2.  DVT Prophylaxis/Anticoagulation: Pharmaceutical: Lovenox 3. Pain Management: tylenol prn 4. Mood: LCSW to follow for evaluation and support.  5. Neuropsych: This patient is capable of making decisions on his own behalf. 6. Skin/Wound Care: routine pressure relief measures 7. Fluids/Electrolytes/Nutrition: intake remains good 8. HTN:   Vitals:   11/19/17 1403 11/20/17 0334  BP: (!) 132/56  115/72  Pulse: 66 86  Resp: 17 17  Temp:  (!) 97.5 F (36.4 C)  SpO2: 99% 97%  Fair control 4/10-continue to monitor -dc flomax per #11 9. CAD s/p CABG: On lipitor and ASA, no chest pain 10 CKD stage III: SCr- 1.8. Cr 1.13 3/28 11. UTI/BPH: Cx showed diptheroids (?contaminant)    -urinary frequency, esp at noc.   -dc hctz, trial of flomax 12. Seasonal allergies v/s URI:  Appears improved. Continue Claritin.    - flonase added for nasal congestion with improvement of symptoms..    LOS (Days) 14 A FACE TO FACE EVALUATION WAS PERFORMED  Ranelle Oyster, MD 11/20/2017 8:48 AM

## 2017-11-20 NOTE — Progress Notes (Signed)
Physical Therapy Note  Patient Details  Name: Thomas [name]. MRN: [id number] Date of Birth: [birth year] Today's Date: 11/20/2017    Time: 830-927 57 minutes  1:1 No c/o pain.  Gait 150' x 2 with RW and close supervision.  [place] exercises performed with 2# wt bilat, min visual cuing for technique.  Tap ups and step ups with HHA with mod/max A for balance and lifting assist to ascend 2'' step with HHA.  Nustep x 6 minutes level 5 for continued LE strengthening.  Pt left in recliner with alarm set, wife present, needs at hand.   Viki Carrera 11/20/2017, 9:27 AM

## 2017-11-20 NOTE — Progress Notes (Signed)
Physical Therapy Session Note  Patient Details  Name: Thomas [name]. MRN: [id number] Date of Birth: [birth year]  Today's Date: 11/20/2017 PT Individual Time: 1445-1515 PT Individual Time Calculation (min): 30 min   Short Term Goals: Week 2:  PT Short Term Goal 1 (Week 2): =LTG  Skilled Therapeutic Interventions/Progress Updates:    Pt seated in recliner in room, agreeable to participate in therapy session. No complaints of pain. Sit to stand with Supervision to RW. Ambulation 2 x 150 ft with RW and Supervision, verbal cues to attend to L visual field to avoid obstacles. Scanning activity with ambulation with RW focusing on finding objects in L and R visual field and scanning environment in busy hallway and in simulation apartment. Pt requires min cueing to avoid obstacles when ambulating through environment to find targets. Pt also requires min cues to safely line up RW next to objects he is trying to collect. Pt exhibits poor eccentric control when sitting and needs cues to sit safely and slowly. Pt left seated in recliner in room with needs in reach, chair alarm and quick release belt in place.  Therapy Documentation Precautions:  Precautions Precautions: Fall Precaution Comments: L hemianopsia Restrictions Weight Bearing Restrictions: No  See Function Navigator for Current Functional Status.   Therapy/Group: Individual Therapy  Peter Congo, PT, DPT  11/20/2017, 3:46 PM

## 2017-11-21 ENCOUNTER — Inpatient Hospital Stay (HOSPITAL_COMMUNITY): Payer: Medicare Other | Admitting: Physical Therapy

## 2017-11-21 ENCOUNTER — Inpatient Hospital Stay (HOSPITAL_COMMUNITY): Payer: Medicare Other | Admitting: Occupational Therapy

## 2017-11-21 ENCOUNTER — Inpatient Hospital Stay (HOSPITAL_COMMUNITY): Payer: Medicare Other | Admitting: *Deleted

## 2017-11-21 ENCOUNTER — Inpatient Hospital Stay (HOSPITAL_COMMUNITY): Payer: Medicare Other | Admitting: Speech Pathology

## 2017-11-21 MED ORDER — TAMSULOSIN HCL 0.4 MG PO CAPS: 0.4000 mg | ORAL_CAPSULE | Freq: Every day | ORAL | 0 refills | Status: DC

## 2017-11-21 NOTE — Discharge Summary (Signed)
Physician Discharge Summary  Patient ID: Thomas [name]. MRN: [id number] DOB/AGE: 1940/11/07 77 y.o.  Admit date: 11/06/2017 Discharge date: 11/22/2017  Discharge Diagnoses:  Principal Problem:   Acute right PCA stroke [facility]) Active Problems:   Essential hypertension   Chronic diastolic CHF (congestive heart failure) (HCC)   Acute renal failure superimposed on stage 2 chronic kidney disease (HCC)   Benign essential HTN   Discharged Condition: stable   Significant Diagnostic Studies:   Labs:  Basic Metabolic Panel: BMP Latest Ref Rng & Units 11/20/2017 11/13/2017 11/07/2017  Glucose 65 - 99 mg/dL 409(W) 119(J) 478(G)  BUN 6 - 20 mg/dL 95(A) 20 13  Creatinine 0.61 - 1.24 mg/dL 2.13 0.86 5.78  Sodium 135 - 145 mmol/L 131(L) 133(L) 133(L)  Potassium 3.5 - 5.1 mmol/L 4.1 4.0 3.7  Chloride 101 - 111 mmol/L 97(L) 99(L) 101  CO2 22 - 32 mmol/L 24 24 25   Calcium 8.9 - 10.3 mg/dL 9.1 8.9 4.6(N)    CBC: CBC Latest Ref Rng & Units 11/20/2017 11/13/2017 11/07/2017  WBC 4.0 - 10.5 K/uL 13.8(H) 7.7 9.0  Hemoglobin 13.0 - 17.0 g/dL 62.9 12.9(L) 14.0  Hematocrit 39.0 - 52.0 % 40.1 38.8(L) 41.3  Platelets 150 - 400 K/uL 283 244 197    CBG: No results for input(s): GLUCAP in the last 168 hours.  Brief HPI:   Thomas Mussa. is a 77 y.o. 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 History taken from chart review, patient, and wife.  UDS negative. CT head reviewed, suggesting right PCA infarct.  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 Stroke work up initiated with DAPT recommended for embolic stroke due to unclear source. TEE showed EF 55-60% with lipomatous hypertrophy of atrial septum and no thrombus noted. Loop recorder  placed yesterday. BLE dopplers were negative for DVT. He was started on rocephin at admission due to confusion and blood cultures X 2 done--pending. UCS with diphtheroids and antibiotics changed to Keflex today. 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 CIR recommended due to functional decline.     Hospital Course: Thomas Lesinski. was admitted to rehab 11/06/2017 for inpatient therapies to consist of PT, ST and OT at least three hours five days a week. Past admission physiatrist, therapy team and rehab RN have worked together to provide customized collaborative inpatient rehab. Blood pressures were monitored on bid basis and have shown good control. Flonase was added to help with chronic nasal congestion due to seasonal allergies. Renal status has been monitored and CKD is stable with SCr at 1.15.  He completed a course of Keflex for treatment of UTI.  Hyponatremia noted on follow up and this has been monitored. Follow up CBC showed H/H is stable on DAPT but rise in WBC noted without signs of infection.  Constipation has been managed with use of Miralax. Mood has been stable and he has made steady progress. Wife has been supportive and present for multiple therapy sessions.  He continues to require cues to compensate for left inattention due to left HH.  He has made good progress during his rehab stay and supervision is recommended due to safety. He will continue to receive follow  up outpatient PT, OT and ST at [facility] after discharge.    Rehab course: During patient's stay in rehab weekly team conferences were held to monitor patient's progress, set goals and discuss barriers to discharge. At admission, patient required min to max assist with ADL tasks with max cues for items in left field. He needed min assist with mobility and exhibited mild to moderate cognitive impairement with MoCa score 19/30.  He  has had improvement in  activity tolerance, balance, postural control as well as ability to compensate for deficits. He is able to complete ADL tasks with supervision. He requires supervision with transfers and to ambulate 150' with RW. He continues to require verbal cues to attend to left field. His safety awareness has improved but requires min assist for safety. His goals were downgraded due to slow progress. He requires min to mod assist with cognitive tasks. Family education was completed with wife regarding all aspects of care and safety.     Disposition: Home   Diet: Heart Healthy/Carb modified.   Special Instructions: 1. Needs supervision with all activity. 2. Recheck BMET/CBC at post hospital follow up.    Discharge Instructions    Ambulatory referral to Physical Medicine Rehab   Complete by:  As directed    Transitional care appt   Ambulatory referral to Vascular Surgery   Complete by:  As directed    Carotid stenosis     Allergies as of 11/22/2017      Reactions   Sulfonamide Derivatives    REACTION: Weak,lethargic      Medication List    STOP taking these medications   cephALEXin 500 MG capsule Commonly known as:  KEFLEX   co-enzyme Q-10 50 MG capsule   hydrochlorothiazide 12.5 MG capsule Commonly known as:  MICROZIDE     TAKE these medications   acetaminophen 325 MG tablet Commonly known as:  TYLENOL Take 1-2 tablets (325-650 mg total) by mouth every 4 (four) hours as needed for mild pain.   aspirin 325 MG EC tablet Take 1 tablet (325 mg total) by mouth daily.   atorvastatin 80 MG tablet Commonly known as:  LIPITOR Take 1 tablet (80 mg total) by mouth daily.   clopidogrel 75 MG tablet Commonly known as:  PLAVIX TAKE 1 TABLET BY MOUTH  DAILY WITH BREAKFAST What changed:    how much to take  how to take this  when to take this   Fish Oil 1000 MG Caps Take 1 capsule by mouth daily.   Flax Oil Take 1 capsule by mouth daily.   fluticasone 50 MCG/ACT nasal  spray Commonly known as:  FLONASE Place 2 sprays into both nostrils daily as needed for allergies or rhinitis.   folic acid 1 MG tablet Commonly known as:  FOLVITE TAKE 1 TABLET EVERY DAY What changed:    how much to take  how to take this  when to take this   hydrocerin Crea Apply 1 application topically 2 (two) times daily.   loratadine 10 MG tablet Commonly known as:  CLARITIN Take 10 mg by mouth daily.   metoprolol tartrate 50 MG tablet Commonly known as:  LOPRESSOR TAKE 1 TABLET BY MOUTH TWO  TIMES DAILY What changed:    how much to take  how to take this  when to take this   sodium chloride 0.65 % Soln nasal spray Commonly known as:  OCEAN Place 1 spray into both nostrils 2 (two) times daily before lunch and  supper.   tamsulosin 0.4 MG Caps capsule Commonly known as:  FLOMAX Take 1 capsule (0.4 mg total) by mouth daily after supper.      Follow-up Information    Ranelle Oyster, MD Follow up.   Specialty:  Physical Medicine and Rehabilitation Why:  Office will call you with follow up appointment Contact information: [address] [place] [address] [phone]        Hillis Range, MD Follow up.   Specialty:  Cardiology Contact information: [address] [place] [address] [phone]        Marvel Plan, MD. Call.   Specialty:  Neurology Why:  for follow up appointment Contact information: [address] [place] [address] [phone]        [facility] Follow up.   Why:  Office will call you for follow up on carotid stenosis Contact information: [address] [place] [address] [phone]       Shirline Frees, NP Follow up on 12/03/2017.   Specialty:  Family Medicine Why:  @ 10:30 am (for hospital follow up appt) - arrive ~ 15 mins prior Contact information: [address] [place] [address] [phone]            Signed: Jacquelynn Cree 11/22/2017, 9:25 AM

## 2017-11-21 NOTE — Patient Care Conference (Signed)
Inpatient RehabilitationTeam Conference and Plan of Care Update Date: 11/19/2017   Time: 2:30 PM    Patient Name: Thomas [name].      Medical Record Number: [id number]  Date of Birth: [birth year] Sex: Male         Room/Bed: 4W09C/4W09C-01 Payor Info: Payor: Multimedia programmer / Plan: [employer] MEDICARE / Product Type: *No Product type* /    Admitting Diagnosis: CVA  Admit Date/Time:  11/06/2017  2:59 PM Admission Comments: No comment available   Primary Diagnosis:  <principal problem not specified> Principal Problem: <principal problem not specified>  Patient Active Problem List   Diagnosis Date Noted  . 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 Benign essential HTN   . Coronary artery disease involving native coronary artery of native heart without angina pectoris   . Stage 3 chronic kidney disease (HCC)   . Acute lower UTI   . Seasonal allergies   . Stroke (HCC) 11/04/2017  . HLD (hyperlipidemia) 11/04/2017  . Chronic diastolic CHF (congestive heart failure) (HCC) 11/04/2017  . Acute renal failure superimposed on stage 2 chronic kidney disease (HCC) 11/04/2017  . Acute metabolic encephalopathy 11/04/2017  . UTI (urinary tract infection) 11/04/2017  . AKI (acute kidney injury) (HCC)   . History of CVA with residual deficit   . Prediabetes   . Left hemiparesis (HCC) 03/19/2013  . Cerebral infarction (HCC) 03/19/2013  . Left leg weakness 03/19/2013  . CAD, ARTERY BYPASS GRAFT 09/30/2008  . 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 Borderline hyperglycemia 12/03/2007  . Other and unspecified hyperlipidemia 07/28/2007  . Essential hypertension 07/28/2007  . 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 Coronary atherosclerosis 07/28/2007  . BENIGN PROSTATIC HYPERTROPHY 07/28/2007  . COLONIC POLYPS, 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    Expected Discharge Date: Expected Discharge Date: 11/22/17  Team Members Present: Physician leading conference: Dr. Faith Rogue Social Worker Present: Amada Jupiter, LCSW Nurse Present: Kennon Portela, RN PT Present: Judieth Keens, PT OT Present: Callie Fielding, OT SLP Present: Feliberto Gottron, SLP PPS Coordinator present : Tora Duck, RN, CRRN     Current Status/Progress Goal Weekly Team Focus  Medical   Patient has remained stable from a medical standpoint.  Blood pressure is controlled.  He is eating well.  Continue dense left homonymous hemianopsia  Improve safety and visual spatial awareness  Continue  medical management as outlined in progress notes   Bowel/Bladder   continent of bowel and bladder increased frequency, requiring prn cath for pvr >350, LBM 4-7  continent of bowel and bladder  Assist pt with tolieting needs   Swallow/Nutrition/ Hydration             ADL's   Steady assist bathing at shower level, Supervision UB/LB dressing, Min A functional transfers using RW at ambulatory level   Downgraded to supervision overall   Pt/family education, Lt attention, balance, functional transfres, endurance, strengthening    Mobility   min A/supervision for transfers and gait with RW, min A stairs  supervision transfers and gait, min A stairs  family ed, d/c planning   Communication             Safety/Cognition/ Behavioral Observations  Mod A  Min A  attention, awareness, recall, problem solving, attention to left    Pain   pt complains of pain in feet managed with tylenol  pain <2  Assess pain q shift and prn,    Skin   generalized bruising, rash to BLE  maintain skin without  further breakdown while on IPR  Assess skin q shift and prn    Rehab Goals Patient on target to meet rehab goals: Yes *See Care Plan and progress notes for long and short-term goals.     Barriers to Discharge  Current Status/Progress Possible Resolutions Date Resolved   Physician    Medical stability        Continue to manage medical conditions as outlined in the chart      Nursing                  PT                    OT                  SLP                 SW                Discharge Planning/Teaching Needs:  Plan home with wife who can provide any needed assistance.      Team Discussion:  No medical concerns;  Improved attention to left.  On track to meet goals and pt to do OP rehab per his request.   Revisions to Treatment Plan:  None    Continued Need for Acute Rehabilitation Level of Care: The patient requires daily medical management by a physician with specialized training in physical medicine and rehabilitation for the following conditions: Daily direction of a multidisciplinary physical rehabilitation program to ensure safe treatment while eliciting the highest outcome that is of practical value to the patient.: Yes Daily medical management of patient stability for increased activity during participation in an intensive rehabilitation regime.: Yes Daily analysis of laboratory values and/or radiology reports with any subsequent need for medication adjustment of medical intervention for : Neurological problems  Thomas [name] 11/21/2017, 3:51 PM

## 2017-11-21 NOTE — Plan of Care (Signed)
  Problem: RH BLADDER ELIMINATION Goal: RH STG MANAGE BLADDER WITH ASSISTANCE Description STG Manage Bladder With Assistance. Mod I  Outcome: Progressing   Problem: RH SAFETY Goal: RH STG ADHERE TO SAFETY PRECAUTIONS W/ASSISTANCE/DEVICE Description STG Adhere to Safety Precautions With Assistance/Device. Min  Outcome: Progressing   Problem: RH Vision Goal: RH LTG Vision (Specify) Outcome: Progressing   

## 2017-11-21 NOTE — Progress Notes (Signed)
Physical Therapy Session Note  Patient Details  Name: Thomas [name]. MRN: [id number] Date of Birth: [birth year]  Today's Date: 11/21/2017 PT Individual Time: 1345-1415 PT Individual Time Calculation (min): 30 min   Short Term Goals: Week 2:  PT Short Term Goal 1 (Week 2): =LTG  Skilled Therapeutic Interventions/Progress Updates:    Pt seated in recliner, agreeable to participate in therapy session. Sit to stand with Supervision to RW. Ambulation x 150 ft with RW and Supervision, verbal cues to attend to L visual field. Reviewed HEP Level "A" exercises: LAQ, sit to/from stand, HS curls, hip abd, mini-squats x 5-10 reps. Pt needs cues for safety when performing standing therex. Pt's wife to provide Supervision for safety when pt at home. Pt left seated in recliner in room at end of session, needs in reach.  Therapy Documentation Precautions:  Precautions Precautions: Fall Precaution Comments: L hemianopsia Restrictions Weight Bearing Restrictions: No  See Function Navigator for Current Functional Status.   Therapy/Group: Individual Therapy  Peter Congo, PT, DPT  11/21/2017, 2:18 PM

## 2017-11-21 NOTE — Progress Notes (Addendum)
[facility]     PROGRESS NOTE    Subjective/Complaints: Patient up in bed.  No new complaints today.  Seems to be in good spirits  ROS: Patient denies fever, rash, sore throat, blurred vision, nausea, vomiting, diarrhea, cough, shortness of breath or chest pain, joint or back pain, headache, or mood change.    Objective: Vital Signs: Blood pressure 118/71, pulse 89, temperature 98.8 F (37.1 C), temperature source Oral, resp. rate 17, height 5\' 11"  (1.803 m), weight 70.2 kg (154 lb 12.2 oz), SpO2 94 %. No results found. Recent Labs    11/20/17 0620  WBC 13.8*  HGB 13.7  HCT 40.1  PLT 283   Recent Labs    11/20/17 0620  NA 131*  K 4.1  CL 97*  GLUCOSE 103*  BUN 22*  CREATININE 1.15  CALCIUM 9.1   CBG (last 3)  No results for input(s): GLUCAP in the last 72 hours.  Wt Readings from Last 3 Encounters:  11/20/17 70.2 kg (154 lb 12.2 oz)  11/05/17 75.5 kg (166 lb 7.2 oz)  10/14/17 76.2 kg (168 lb)    Physical Exam:  Constitutional: No distress . Vital signs reviewed. HEENT: EOMI, oral membranes moist Cardiovascular: RRR without murmur. No JVD    Respiratory: CTA Bilaterally without wheezes or rales. Normal effort    GI: BS +, non-tender, non-distended  Musculoskeletal:   No limb edema.   Neurological: He is alert and oriented to person, place, and time.  Fair insight and awareness  persistent left inattention and homonymous hemianopsia-persistent--stable neurological exam mild dysarthria.   LUE: 4+/5 proximal to distal, RUE almost 5/5 RLE: 4+ to 5/5 proximal to distal LLE: 4 to 4+/5 proximal to distal  LUE mild ataxia with FTN--motor stable Senses pain and light touch in all 4  Skin: Warm Psychiatric: irritable    Assessment/Plan: 1. Functional deficits  secondary to right PCA infarct which require 3+ hours per day of interdisciplinary therapy in a comprehensive inpatient rehab setting. Physiatrist is providing close team  supervision and 24 hour management of active medical problems listed below. Physiatrist and rehab team continue to assess barriers to discharge/monitor patient progress toward functional and medical goals.  Function:  Bathing Bathing position Bathing activity did not occur: Refused Position: Systems developer parts bathed by patient: Right arm, Left lower leg, Left arm, Chest, Abdomen, Front perineal area, Buttocks, Right upper leg, Left upper leg, Right lower leg Body parts bathed by helper: Back  Bathing assist Assist Level: Supervision or verbal cues      Upper Body Dressing/Undressing Upper body dressing   What is the patient wearing?: Pull over shirt/dress     Pull over shirt/dress - Perfomed by patient: Thread/unthread right sleeve, Thread/unthread left sleeve, Put head through opening, Pull shirt over trunk          Upper body assist Assist Level: Supervision or verbal cues   Set up : To obtain clothing/put away  Lower Body Dressing/Undressing Lower body dressing   What is the patient wearing?: Pants, Shoes, Non-skid slipper socks, Underwear Underwear - Performed by patient: Thread/unthread right underwear leg, Thread/unthread left underwear leg, Pull underwear up/down   Pants- Performed by patient: Thread/unthread right pants leg, Thread/unthread left pants leg, Pull pants up/down   Non-skid slipper socks- Performed by patient: Don/doff right sock, Don/doff left sock   Socks - Performed by patient: Don/doff right sock, Don/doff left sock Socks - Performed by helper: Don/doff right sock,  Don/doff left sock Shoes - Performed by patient: Don/doff right shoe, Don/doff left shoe, Fasten right, Fasten left            Lower body assist Assist for lower body dressing: Supervision or verbal cues      Toileting Toileting   Toileting steps completed by patient: Adjust clothing prior to toileting, Performs perineal hygiene, Adjust clothing after  toileting Toileting steps completed by helper: Adjust clothing after toileting Toileting Assistive Devices: Grab bar or rail  Toileting assist Assist level: Touching or steadying assistance (Pt.75%)   Transfers Chair/bed transfer   Chair/bed transfer method: Ambulatory Chair/bed transfer assist level: Supervision or verbal cues Chair/bed transfer assistive device: Walker, Designer, fashion/clothing     Max distance: 150 ft Assist level: Supervision or verbal cues   Wheelchair   Type: Manual Max wheelchair distance: 150 Assist Level: Supervision or verbal cues  Cognition Comprehension Comprehension assist level: Follows basic conversation/direction with extra time/assistive device  Expression Expression assist level: Expresses basic needs/ideas: With extra time/assistive device  Social Interaction Social Interaction assist level: Interacts appropriately 90% of the time - Needs monitoring or encouragement for participation or interaction.  Problem Solving Problem solving assist level: Solves basic 75 - 89% of the time/requires cueing 10 - 24% of the time  Memory Memory assist level: Recognizes or recalls 75 - 89% of the time/requires cueing 10 - 24% of the time   Medical Problem List and Plan: 1. 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   -Finalize discharge planning for 4/12 2.  DVT Prophylaxis/Anticoagulation: Pharmaceutical: Lovenox 3. Pain Management: tylenol prn 4. Mood: LCSW to follow for evaluation and support.  5. Neuropsych: This patient is capable of making decisions on his own behalf. 6. Skin/Wound Care: routine pressure relief measures 7. Fluids/Electrolytes/Nutrition: intake remains good 8. HTN:   Vitals:   11/20/17 2012 11/21/17 0353  BP: 122/81 118/71  Pulse: 86 89  Resp:  17  Temp:  98.8 F (37.1 C)  SpO2:  94%  Fair control 4/11-continue to monitor -dc'ed  hctz, flomax per #11 9. CAD s/p CABG: On lipitor and ASA, no chest pain 10 CKD stage III: SCr- 1.8. Cr 1.13 3/28 11. UTI/BPH: 3/25 UCx showed diptheroids (?contaminant)    -urinary frequency, esp at noc.   -dc'ed hctz, trial of flomax initiated last night--> continue as outpatient   -Urine reported as malodorous.  Recheck urine culture today 12. Seasonal allergies v/s URI:  Appears improved. Continue Claritin.    - flonase added for nasal congestion with improvement of symptoms..    LOS (Days) 15 A FACE TO FACE EVALUATION WAS PERFORMED  Ranelle Oyster, MD 11/21/2017 8:44 AM

## 2017-11-21 NOTE — Progress Notes (Signed)
Occupational Therapy Discharge Summary  Patient Details  Name: Thomas [name]. MRN: [id number] Date of Birth: [birth year]  Today's Date: 11/21/2017 OT Individual Time: 0700-0800 OT Individual Time Calculation (min): 60 min    Patient has met 6 of 6 long term goals due to improved activity tolerance, improved balance, ability to compensate for deficits, improved attention, improved awareness and improved coordination.  Patient to discharge at overall Supervision level.  Patient's care partner is independent to provide the necessary physical and cognitive assistance at discharge.    Reasons goals not met: all goals met  Recommendation:  Patient will benefit from ongoing skilled OT services in outpatient setting to continue to advance functional skills in the area of BADL and iADL.  Equipment: BSC and TTB  Reasons for discharge: treatment goals met  Patient/family agrees with progress made and goals achieved: Yes   OT Intervention: Upon entering the room, pt supine in bed awaiting therapist arrival and breakfast tray present as well. Pt requesting to eat first with overall supervision and min cues to locate items to L side. Pt standing from bed and ambulating with RW and close supervision into bathroom for toileting needs. Pt returning to sit in recliner chair with supervision for balance with LB clothing management. OT reviewed recommendations for equipment and OT follow up. Caregiver has already participated in hands on family education. No further questions at this time. Pt remained seated in recliner chair with quick release belt donned and chair alarm activated.   OT Discharge Precautions/Restrictions  Precautions Precautions: Fall Precaution Comments: L hemianopsia Restrictions Weight Bearing Restrictions: No Pain Pain Assessment Pain Scale: 0-10 Pain Score: 0-No pain ADL ADL ADL Comments: See functional navigator Vision Eye Alignment: Impaired (comment) Perception   Perception: Impaired Inattention/Neglect: Does not attend to left visual field;Does not attend to left side of body Praxis Praxis: Impaired Praxis Impairment Details: Initiation;Motor planning Cognition Orientation Level: Oriented X4 Safety/Judgment: Impaired Sensation Sensation Light Touch: Appears Intact Proprioception: Impaired Detail Proprioception Impaired Details: Impaired LLE Coordination Gross Motor Movements are Fluid and Coordinated: No Fine Motor Movements are Fluid and Coordinated: Yes Coordination and Movement Description: decreased smoothness BLE Heel Shin Test: dysmetria on the L Motor  Motor Motor: Abnormal postural alignment and control;Motor apraxia;Hemiplegia Motor - Discharge Observations: Pt with continued dysmetria of LUE and LLE Mobility  Bed Mobility Bed Mobility: Supine to Sit;Rolling Right;Rolling Left;Sit to Supine Rolling Right: 5: Supervision Rolling Left: 5: Supervision Supine to Sit: 5: Supervision Sit to Supine: 5: Supervision Transfers Sit to Stand: 5: Supervision Stand to Sit: 5: Supervision  Trunk/Postural Assessment  Cervical Assessment Cervical Assessment: Exceptions to [facility] Cervical AROM Overall Cervical AROM: Other (comment)(forward head) Thoracic Assessment Thoracic Assessment: Within Functional Limits Lumbar Assessment Lumbar Assessment: Within Functional Limits Postural Control Postural Control: Deficits on evaluation Trunk Control: Fair standing balance  Balance Balance Balance Assessed: Yes Static Sitting Balance Static Sitting - Balance Support: No upper extremity supported Static Sitting - Level of Assistance: 6: Modified independent (Device/Increase time) Dynamic Sitting Balance Dynamic Sitting - Balance Support: No upper extremity supported Dynamic Sitting - Level of Assistance: 5: Stand by assistance Static Standing Balance Static Standing - Balance Support: Bilateral upper extremity supported Static Standing -  Level of Assistance: 5: Stand by assistance Dynamic Standing Balance Dynamic Standing - Balance Support: Right upper extremity supported Dynamic Standing - Level of Assistance: 5: Stand by assistance Dynamic Standing - Balance Activities: Reaching for objects Extremity/Trunk Assessment RUE Assessment RUE Assessment: Within Functional Limits LUE Assessment  LUE Assessment: Within Functional Limits   See Function Navigator for Current Functional Status.  Darleen Crocker P 11/21/2017, 11:22 AM

## 2017-11-21 NOTE — Progress Notes (Signed)
Recreational Therapy Session Note  Patient Details  Name: Thomas [name]. MRN: [id number] Date of Birth: Apr 28, 1941 Today's Date: 11/21/2017  Pain: no c/o Skilled Therapeutic Interventions/Progress Updates: Met with pt's wife for family education in regards to community pursuits.  Pt sleeping in recliner.  Discussed at length energy conservation techniques, identification & negotiation of common obstacles and public restroom access.  Pts wife with multiple question in regards to accessing public restrooms, all addressed.  Also discussed activity analysis with potential modifications.  Pts wife stated understanding and appreciation of information    Recreational Therapy Discharge Summary Patient Details  Name: Thomas [name]. MRN: [id number] Date of Birth: Aug 31, 1940 Today's Date: 11/21/2017 Comments on progress toward goals: Pt has made good progress during LOS and is ready for discharge home with wife to provide 24 hour supervision/assistance.  TR session focused on pt/family education in regards to community pursuits.  Outing cancelled this week due to therapist illness, however education provided.   Reasons for discharge: discharge from hospital Patient/family agrees with progress made and goals achieved: Yes  Erek Kowal 11/21/2017, 8:12 AM      Alayiah Fontes 11/21/2017, 8:13 AM

## 2017-11-21 NOTE — Progress Notes (Signed)
Speech Language Pathology Discharge Summary  Patient Details  Name: Thomas [name]. MRN: [id number] Date of Birth: 1941-02-03  Today's Date: 11/21/2017 SLP Individual Time: 1219-7588 SLP Individual Time Calculation (min): 40 min   Skilled Therapeutic Interventions:  Pt was seen for skilled ST targeting cognitive goals.  SLP facilitated the session with safety awareness tasks using picture cards.  Pt identified safety concerns in pictures and generated safe solutions to problems with min assist question cues.  Reviewed and reinforced safety  recommendations with pt's wife upon return to room.  All questions were answered to pt's satisfaction at this time.  Pt was left in recliner with call bell within reach and wife at bedside.  Pt is discharging tomorrow.       Patient has met 4 of 4 long term goals.  Patient to discharge at overall Min;Mod level.  Reasons goals not met:     Clinical Impression/Discharge Summary: Pt has made functional gains while inpatient and is discharging having met 4 out of 4 short term goals, although some goals were downgraded to accommodate slow progress.  Pt is currently min-mod assist for tasks due to mild-moderate cognitive deficits.  Pt and family education is complete at this time.  Pt has demonstrated improved anticipatory awareness of how his deficits will impact his functional independence in the home environment.  Pt is discharging home with recommendations for ST follow up at next level of care to address cognition function.     Care Partner:  Caregiver Able to Provide Assistance: Yes  Type of Caregiver Assistance: Physical;Cognitive  Recommendation:  24 hour supervision/assistance;Home Health SLP  Rationale for SLP Follow Up: Maximize cognitive function and independence;Reduce caregiver burden   Equipment: none recommended by SLP    Reasons for discharge: Treatment goals met   Patient/Family Agrees with Progress Made and Goals Achieved: Yes    Function:  Eating Eating                 Cognition Comprehension Comprehension assist level: Follows basic conversation/direction with extra time/assistive device  Expression   Expression assist level: Expresses basic needs/ideas: With extra time/assistive device  Social Interaction Social Interaction assist level: Interacts appropriately 90% of the time - Needs monitoring or encouragement for participation or interaction.  Problem Solving Problem solving assist level: Solves basic 75 - 89% of the time/requires cueing 10 - 24% of the time  Memory Memory assist level: Recognizes or recalls 75 - 89% of the time/requires cueing 10 - 24% of the time   Emilio Math 11/21/2017, 4:12 PM

## 2017-11-21 NOTE — Progress Notes (Signed)
Physical Therapy Discharge Summary  Patient Details  Name: Thomas [name]. MRN: [id number] Date of Birth: Oct 12, 1940  Today's Date: 11/21/2017 PT Individual Time: 0900-1000 PT Individual Time Calculation (min): 60 min    Patient has met 11 of 11 long term goals due to improved activity tolerance, improved balance and improved awareness.  Patient to discharge at an ambulatory level Supervision.   Patient's care partner is independent to provide the necessary cognitive assistance at discharge.  Reasons goals not met: Pt has met all goals.  Recommendation:  Patient will benefit from ongoing skilled PT services in home health setting to continue to advance safe functional mobility, address ongoing impairments in visual deficits, balance, and safety awareness, and minimize fall risk.  Equipment: RW  Reasons for discharge: treatment goals met and discharge from hospital  Patient/family agrees with progress made and goals achieved: Yes   PT Intervention: Pt seated in recliner in room, agreeable to participate in therapy session. Sit to stand with Supervision to RW. Ambulation x 150 ft with RW and Supervision, v/c to attend to left visual field and avoid obstacles. Manual w/c propulsion x 150 ft with SBA, increased time needed to complete task and verbal cues to attend to left visual field and not run into the wall on the left. Car transfer with SBA and use of RW. Ascend/descend 8 stairs with 2 handrails and min assist. Pt reminded to descend slowly when sitting in chair and not "plop". Pt becomes agitated with therapist and states that he knows he is not supposed to "plop" but continues to exhibit poor eccentric control when sitting. Pt left seated in recliner in room with chair alarm in place, needs in reach. Patient's wife to provide Supervision at discharge, demos good understanding of pt assist level needed.  PT Discharge Precautions/Restrictions Precautions Precautions:  Fall Precaution Comments: L hemianopsia Restrictions Weight Bearing Restrictions: No Vision/Perception  Vision - Assessment Eye Alignment: Impaired (comment) Perception Perception: Impaired Inattention/Neglect: Does not attend to left visual field;Does not attend to left side of body Praxis Praxis: Impaired Praxis Impairment Details: Initiation;Motor planning  Cognition Orientation Level: Oriented X4 Safety/Judgment: Impaired Sensation Sensation Light Touch: Appears Intact Proprioception: Impaired Detail Proprioception Impaired Details: Impaired LLE Coordination Gross Motor Movements are Fluid and Coordinated: No Fine Motor Movements are Fluid and Coordinated: Yes Coordination and Movement Description: decreased smoothness BLE Heel Shin Test: dysmetria on the L Motor  Motor Motor: Abnormal postural alignment and control;Motor apraxia;Hemiplegia Motor - Discharge Observations: Pt with continued dysmetria of LUE and LLE  Mobility Bed Mobility Bed Mobility: Supine to Sit;Rolling Right;Rolling Left;Sit to Supine Rolling Right: 5: Supervision Rolling Left: 5: Supervision Supine to Sit: 5: Supervision Sit to Supine: 5: Supervision Transfers Sit to Stand: 5: Supervision Stand to Sit: 5: Supervision Trunk/Postural Assessment  Cervical Assessment Cervical Assessment: Exceptions to [facility] Cervical AROM Overall Cervical AROM: Other (comment)(forward head) Thoracic Assessment Thoracic Assessment: Within Functional Limits Thoracic AROM Overall Thoracic AROM: Other (comment)(kyphotic) Lumbar Assessment Lumbar Assessment: Within Functional Limits Postural Control Postural Control: Deficits on evaluation Trunk Control: Fair standing balance  Balance Balance Balance Assessed: Yes Static Sitting Balance Static Sitting - Balance Support: No upper extremity supported Static Sitting - Level of Assistance: 6: Modified independent (Device/Increase time) Dynamic Sitting  Balance Dynamic Sitting - Balance Support: No upper extremity supported Dynamic Sitting - Level of Assistance: 5: Stand by assistance Static Standing Balance Static Standing - Balance Support: Bilateral upper extremity supported Static Standing - Level of Assistance: 5: Stand by  assistance Dynamic Standing Balance Dynamic Standing - Balance Support: Right upper extremity supported Dynamic Standing - Level of Assistance: 5: Stand by assistance Dynamic Standing - Balance Activities: Reaching for objects Extremity Assessment  RUE Assessment RUE Assessment: Within Functional Limits LUE Assessment LUE Assessment: Within Functional Limits RLE Assessment RLE Assessment: Within Functional Limits LLE Assessment LLE Assessment: Exceptions to [facility] LLE Strength LLE Overall Strength Comments: 4/5 hip flexion, others 5/5 grossly   See Function Navigator for Current Functional Status.  Excell Seltzer, PT, DPT  11/21/2017, 12:09 PM

## 2017-11-21 NOTE — Progress Notes (Signed)
Patient had a fall ambulating unassisted from the bed trying to go to the restroom. Fall was witnessed by another patient. No head injury. Small skin tear to the right elbow and some bruising noted. Area was cleansed with normal saline & covered with a foam dressing. No c/o pain or discomfort. Vitals taken per policy. On call provider & wife was called and informed. Will continue to monitor

## 2017-11-22 DIAGNOSIS — D72829 Elevated white blood cell count, unspecified: Secondary | ICD-10-CM

## 2017-11-22 DIAGNOSIS — I5032 Chronic diastolic (congestive) heart failure: Secondary | ICD-10-CM

## 2017-11-22 DIAGNOSIS — E871 Hypo-osmolality and hyponatremia: Secondary | ICD-10-CM

## 2017-11-22 DIAGNOSIS — R8290 Unspecified abnormal findings in urine: Secondary | ICD-10-CM

## 2017-11-22 NOTE — Plan of Care (Signed)
Pt d/c home  

## 2017-11-22 NOTE — Progress Notes (Addendum)
[place] PHYSICAL MEDICINE & REHABILITATION     PROGRESS NOTE    Subjective/Complaints: Pt seen lying in bed this AM.  He slept well overnight. He states she is ready for discharge today.  ROS: denies CP, SOB, nausea, vomiting, diarrhea.   Objective: Vital Signs: Blood pressure 116/82, pulse (!) 56, temperature 97.8 F (36.6 C), temperature source Oral, resp. rate 17, height 5\' 11"  (1.803 m), weight 70.2 kg (154 lb 12.2 oz), SpO2 99 %. No results found. Recent Labs    11/20/17 0620  WBC 13.8*  HGB 13.7  HCT 40.1  PLT 283   Recent Labs    11/20/17 0620  NA 131*  K 4.1  CL 97*  GLUCOSE 103*  BUN 22*  CREATININE 1.15  CALCIUM 9.1   CBG (last 3)  No results for input(s): GLUCAP in the last 72 hours.  Wt Readings from Last 3 Encounters:  11/20/17 70.2 kg (154 lb 12.2 oz)  11/05/17 75.5 kg (166 lb 7.2 oz)  10/14/17 76.2 kg (168 lb)    Physical Exam:  Constitutional: No distress . Vital signs reviewed. HENT: Normocephalic, atraumatic Eyes: EOMI. No discharge. Cardiovascular: RRR. No JVD.   Respiratory: CTA Bilaterally. Normal effort    GI: BS +, non-distended  Musculoskeletal: No edema or tenderness in extremities   Neurological: He is alert and oriented Left inattention and homonymous hemianopsia Mild dysarthria.   LUE: 4+/5 proximal to distal RUE 5/5 proximal to distal RLE: 5/5 proximal to distal LLE: 4+/5 proximal to distal  Skin: Warm and dry. Intact Psychiatric: normal mood. Normal behavior.  Assessment/Plan: 1. Functional deficits  secondary to right PCA infarct which require 3+ hours per day of interdisciplinary therapy in a comprehensive inpatient rehab setting. Physiatrist is providing close team supervision and 24 hour management of active medical problems listed below. Physiatrist and rehab team continue to assess barriers to discharge/monitor patient progress toward functional and medical goals.  Function:  Bathing Bathing position Bathing  activity did not occur: Refused Position: Systems developer parts bathed by patient: Right arm, Left lower leg, Left arm, Chest, Abdomen, Front perineal area, Buttocks, Right upper leg, Left upper leg, Right lower leg Body parts bathed by helper: Back  Bathing assist Assist Level: Supervision or verbal cues      Upper Body Dressing/Undressing Upper body dressing   What is the patient wearing?: Pull over shirt/dress     Pull over shirt/dress - Perfomed by patient: Thread/unthread right sleeve, Thread/unthread left sleeve, Put head through opening, Pull shirt over trunk          Upper body assist Assist Level: Supervision or verbal cues   Set up : To obtain clothing/put away  Lower Body Dressing/Undressing Lower body dressing   What is the patient wearing?: Pants, Shoes, Non-skid slipper socks, Underwear Underwear - Performed by patient: Thread/unthread right underwear leg, Thread/unthread left underwear leg, Pull underwear up/down   Pants- Performed by patient: Thread/unthread right pants leg, Thread/unthread left pants leg, Pull pants up/down   Non-skid slipper socks- Performed by patient: Don/doff right sock, Don/doff left sock   Socks - Performed by patient: Don/doff right sock, Don/doff left sock Socks - Performed by helper: Don/doff right sock, Don/doff left sock Shoes - Performed by patient: Don/doff right shoe, Don/doff left shoe, Fasten right, Fasten left            Lower body assist Assist for lower body dressing: Supervision or verbal cues      Interior and spatial designer  Toileting steps completed by patient: Adjust clothing prior to toileting, Performs perineal hygiene, Adjust clothing after toileting Toileting steps completed by helper: Adjust clothing after toileting Toileting Assistive Devices: Grab bar or rail  Toileting assist Assist level: Supervision or verbal cues   Transfers Chair/bed transfer   Chair/bed transfer method: Ambulatory Chair/bed  transfer assist level: Supervision or verbal cues Chair/bed transfer assistive device: Armrests, Patent attorney     Max distance: 150 ft Assist level: Supervision or verbal cues   Wheelchair   Type: Manual Max wheelchair distance: 150 ft Assist Level: Supervision or verbal cues  Cognition Comprehension Comprehension assist level: Follows basic conversation/direction with extra time/assistive device  Expression Expression assist level: Expresses basic needs/ideas: With extra time/assistive device  Social Interaction Social Interaction assist level: Interacts appropriately 90% of the time - Needs monitoring or encouragement for participation or interaction.  Problem Solving Problem solving assist level: Solves basic 75 - 89% of the time/requires cueing 10 - 24% of the time  Memory Memory assist level: Recognizes or recalls 75 - 89% of the time/requires cueing 10 - 24% of the time   Medical Problem List and Plan: 1. 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   DC today  Follow-up with M.D. For transitional care management in 1-2 weeks post discharge 2.  DVT Prophylaxis/Anticoagulation: Pharmaceutical: Lovenox 3. Pain Management: tylenol prn 4. Mood: LCSW to follow for evaluation and support.  5. Neuropsych: This patient is capable of making decisions on his own behalf. 6. Skin/Wound Care: routine pressure relief measures 7. Fluids/Electrolytes/Nutrition:  8. HTN:   Vitals:   11/21/17 2304 11/22/17 0500  BP: 114/71 116/82  Pulse: 72 (!) 56  Resp: 18 17  Temp: 98 F (36.7 C) 97.8 F (36.6 C)  SpO2: 98% 99%    Dc'ed hctz, flomax per #11  Relatively controlled on 4/12 9. CAD s/p CABG: On lipitor and ASA 10 CKD stage III: SCr- 1.8.    Cr 1.15 on 4/10 11. UTI/BPH: 3/25 UCx showed diptheroids (?contaminant)    -urinary frequency, esp at noc.   -dc'ed hctz, trial of flomax  initiated, continue as outpatient   -Urine reported as malodorous.  Recheck urine culture results pending 12. Seasonal allergies v/s URI:  Appears improved. Continue Claritin.    Flonase added for nasal congestion with improvement of symptoms..  13. Leukocytosis   WBCs 13.8 on 4/10   Ucx pending 14. Hyponatremia   Na 131 on 4/10   Will need to monitor as outpt  LOS (Days) 16 A FACE TO FACE EVALUATION WAS PERFORMED  Ankit Karis Juba, MD 11/22/2017 9:49 AM

## 2017-11-22 NOTE — Discharge Instructions (Signed)
Inpatient Rehab Discharge Instructions  Thomas [name]. Discharge date and time:  11/21/17  Activities/Precautions/ Functional Status: Activity: no lifting, driving, or strenuous exercise for till cleared by MD Diet: cardiac diet Wound Care: keep wound clean and dry   Functional status:  ___ No restrictions     ___ Walk up steps independently _X__ 24/7 supervision/assistance   ___ Walk up steps with assistance ___ Intermittent supervision/assistance  ___ Bathe/dress independently ___ Walk with walker     _X__ Bathe/dress with assistance ___ Walk Independently    ___ Shower independently _X__ Walk with assistance    ___ Shower with assistance _X__ No alcohol     ___ Return to work/school ________     COMMUNITY REFERRALS UPON DISCHARGE:    Outpatient: PT     OT    ST                  Agency:  [facility]      Phone:  [phone]               Appointment Date/Time:  4/18 @ 8:00 and 8:45 am  (occupational and physical therapies);  4/25 @ 9:30 (speech)  Medical Equipment/Items Ordered: 3n1 commode and tub bench                                                     Agency/Supplier: [facility] @ [phone]   GENERAL COMMUNITY RESOURCES FOR PATIENT/FAMILY:  Support Groups:  Stroke Support Group                             2nd Thursday or each month (Sept - May) @ 3:00 pm                             [facility] (dayrooom)               Contact:  Estill Dooms @ [phone]     Special Instructions:   STROKE/TIA DISCHARGE INSTRUCTIONS SMOKING Cigarette smoking nearly doubles your risk of having a stroke & is the single most alterable risk factor  If you smoke or have smoked in the last 12 months, you are advised to quit smoking for your health.  Most of the excess cardiovascular risk related to smoking disappears within a year of stopping.  Ask you doctor about anti-smoking medications  [place] Quit Line: 1-800-QUIT NOW  Free Smoking Cessation  Classes [phone]  CHOLESTEROL Know your levels; limit fat & cholesterol in your diet  Lipid Panel     Component Value Date/Time   CHOL 138 11/04/2017 0539   TRIG 80 11/04/2017 0539   HDL 42 11/04/2017 0539   CHOLHDL 3.3 11/04/2017 0539   VLDL 16 11/04/2017 0539   LDLCALC 80 11/04/2017 0539      Many patients benefit from treatment even if their cholesterol is at goal.  Goal: Total Cholesterol (CHOL) less than 160  Goal:  Triglycerides (TRIG) less than 150  Goal:  HDL greater than 40  Goal:  LDL (LDLCALC) less than 100   BLOOD PRESSURE American Stroke Association blood pressure target is less that 120/80 mm/Hg  Your discharge blood pressure is:  BP: 109/71  Monitor your blood  pressure  Limit your salt and alcohol intake  Many individuals will require more than one medication for high blood pressure  DIABETES (A1c is a blood sugar average for last 3 months) Goal HGBA1c is under 7% (HBGA1c is blood sugar average for last 3 months)  Diabetes: No known diagnosis of diabetes    Lab Results  Component Value Date   HGBA1C 5.8 (H) 11/04/2017     Your HGBA1c can be lowered with medications, healthy diet, and exercise.  Check your blood sugar as directed by your physician  Call your physician if you experience unexplained or low blood sugars.  PHYSICAL ACTIVITY/REHABILITATION Goal is 30 minutes at least 4 days per week  Activity: No driving, Therapies: see above Return to work: N/A  Activity decreases your risk of heart attack and stroke and makes your heart stronger.  It helps control your weight and blood pressure; helps you relax and can improve your mood.  Participate in a regular exercise program.  Talk with your doctor about the best form of exercise for you (dancing, walking, swimming, cycling).  DIET/WEIGHT Goal is to maintain a healthy weight  Your discharge diet is: Diet Heart Room service appropriate? Yes; Fluid consistency: Thin  liquids Your height is:   Height: 5\' 11"  (180.3 cm) Your current weight is: Weight: 70.1 kg (154 lb 8.7 oz) Your Body Mass Index (BMI) is:  BMI (Calculated): 21.56  Following the type of diet specifically designed for you will help prevent another stroke.  You are at goal weight  Your goal Body Mass Index (BMI) is 19-24.  Healthy food habits can help reduce 3 risk factors for stroke:  High cholesterol, hypertension, and excess weight.  RESOURCES Stroke/Support Group:  Call [phone]   STROKE EDUCATION PROVIDED/REVIEWED AND GIVEN TO PATIENT Stroke warning signs and symptoms How to activate emergency medical system (call 911). Medications prescribed at discharge. Need for follow-up after discharge. Personal risk factors for stroke. Pneumonia vaccine given:  Flu vaccine given:  My questions have been answered, the writing is legible, and I understand these instructions.  I will adhere to these goals & educational materials that have been provided to me after my discharge from the hospital.      My questions have been answered and I understand these instructions. I will adhere to these goals and the provided educational materials after my discharge from the hospital.  Patient/Caregiver Signature _______________________________ Date __________  Clinician Signature _______________________________________ Date __________  Please bring this form and your medication list with you to all your follow-up doctor's appointments.

## 2017-11-22 NOTE — Progress Notes (Signed)
Social Work  Discharge Note  The overall goal for the admission was met for:   Discharge location: Yes - home with wife who can provide 24/7 assistance  Length of Stay: Yes - 16 days  Discharge activity level: Yes - supervision overall  Home/community participation: Yes  Services provided included: MD, RD, PT, OT, SLP, RN, TR, Pharmacy and [place]: [facility] Medicare  Follow-up services arranged: Outpatient: PT, OT, ST via [facility], DME: 3n1 commode and tub bench via [place] and Patient/Family has no preference for HH/DME agencies  Comments (or additional information):  Patient/Family verbalized understanding of follow-up arrangements: Yes  Individual responsible for coordination of the follow-up plan: pt/ wife  Confirmed correct DME delivered: Lennart Pall 11/22/2017    Thomas [name]

## 2017-11-22 NOTE — Progress Notes (Signed)
Pt DC today. Wife present. PA P. Love went over DC instructions. Pt nor wife had concerns. Pt belongings packed up. Escorted pt and wife to main lobby. No c/o pain noted.

## 2017-11-23 LAB — URINE CULTURE: Culture: >=100000 — AB

## 2017-11-25 ENCOUNTER — Telehealth: Payer: Self-pay | Admitting: Family Medicine

## 2017-11-25 ENCOUNTER — Ambulatory Visit: Payer: Medicare Other | Admitting: Family Medicine

## 2017-11-25 ENCOUNTER — Encounter: Payer: Self-pay | Admitting: Family Medicine

## 2017-11-25 ENCOUNTER — Telehealth (HOSPITAL_COMMUNITY): Payer: Self-pay | Admitting: Physical Medicine and Rehabilitation

## 2017-11-25 ENCOUNTER — Telehealth: Payer: Self-pay | Admitting: Adult Health

## 2017-11-25 VITALS — BP 122/62 | HR 75 | Temp 97.7°F | Ht 71.0 in

## 2017-11-25 DIAGNOSIS — E876 Hypokalemia: Secondary | ICD-10-CM

## 2017-11-25 DIAGNOSIS — B379 Candidiasis, unspecified: Secondary | ICD-10-CM | POA: Diagnosis not present

## 2017-11-25 MED ORDER — FLUCONAZOLE 100 MG PO TABS: 100.0000 mg | ORAL_TABLET | Freq: Every day | ORAL | 0 refills | Status: DC

## 2017-11-25 MED ORDER — KETOCONAZOLE 2 % EX CREA: 1.0000 "application " | TOPICAL_CREAM | Freq: Two times a day (BID) | CUTANEOUS | 1 refills | Status: DC

## 2017-11-25 MED ORDER — FLUCONAZOLE 100 MG PO TABS: 100.0000 mg | ORAL_TABLET | Freq: Two times a day (BID) | ORAL | 0 refills | Status: DC

## 2017-11-25 NOTE — Telephone Encounter (Signed)
I spoke with wife and husband has rash around groin area. I spoke with Dr. Clent Ridges and he can see pt this morning. Pt has been scheduled.

## 2017-11-25 NOTE — Telephone Encounter (Signed)
Called wife with results of UCS. She reports that his urine smells strong but no other symptoms as well as issues with candidal rash (which got worse this weekend). He was started on diflucan today. Advised them that will treat UTI conservatively with Macrobid 100 mg BID X 5 days. To go ahead and take two doses of diflucan today (as advised) and start Macrobid this evening. To contact MD if rash got worse.

## 2017-11-25 NOTE — Telephone Encounter (Signed)
Pt came in today and saw Dr. Clent Ridges.

## 2017-11-25 NOTE — Progress Notes (Signed)
   Subjective:    Patient ID: Thomas Alba., male    DOB: 04/22/1941, 77 y.o.   MRN: [id number]  HPI Here with his wife for an itchy painful rash on the abdomen, in the groins, and on the scrotum. This began about 2 weeks ago. They are keeping the area as dry and clean as possible.    Review of Systems  Constitutional: Negative.   Respiratory: Negative.   Cardiovascular: Negative.   Skin: Positive for rash.       Objective:   Physical Exam  Constitutional:  In a wheelchair  Cardiovascular: Normal rate, regular rhythm, normal heart sounds and intact distal pulses.  Pulmonary/Chest: Effort normal and breath sounds normal. No respiratory distress. He has no wheezes. He has no rales.  Skin:  The areas above have large patches of macular erythema with surrounding satellite lesions           Assessment & Plan:  Candidiasis, treat with Ketoconazole cream and oral Fluconazole for 30 days. Recheck with Thomas [name] next week.  Gershon Crane, MD

## 2017-11-25 NOTE — Telephone Encounter (Signed)
Copied from CRM [id number]. Topic: Quick Communication - See Telephone Encounter >> Nov 25, 2017  8:03 AM Louie Bun, Rosey Bath D wrote: CRM for notification. See Telephone encounter for: 11/25/17. Patient wife called and would like to talk to provider or CMA about patient. He was in the hospital and discharge Friday and patient got hives this weekend and issue around his growing area. Please call wife [name] back, thanks.

## 2017-11-26 ENCOUNTER — Telehealth: Payer: Self-pay | Admitting: Registered Nurse

## 2017-11-26 NOTE — Telephone Encounter (Signed)
Transitional Care call Transitional Care Call Completed, Appointment Confirmed, Address Confirmed, New Patient Mailed Transitional Care Call Questions answered by Mrs. Smelser  Patient name: Thomas [name] DOB: [birth year] 1. Are you/is patient experiencing any problems since coming home? Yes, Mrs. Dornfeld states [name] developed hives all over his body and developed a painful rash on his abdomen, groin and scrotum. He was seen by Dr. Clent Ridges on 11/25/2017. He was diagnosed with candidiasis and prescribed Ketoconazole and Fluconazole. Note was reviewed. Also states she received a call from [facility], with urine culture results, He was prescribed Macrobid, note reviewed.  a. Are there any questions regarding any aspect of care? No, see above note.  2. Are there any questions regarding medications administration/dosing? No a. Are meds being taken as prescribed? Yes b. "Patient should review meds with caller to confirm" Medication List Reviewed 3. Have there been any falls? No 4. Has Home Health been to the house and/or have they contacted you? He has [facility] scheduled [facility] 11/28/2017 a. If not, have you tried to contact them? NA b. Can we help you contact them? NA 5. Are bowels and bladder emptying properly? Yes a. Are there any unexpected incontinence issues? No b. If applicable, is patient following bowel/bladder programs? NA 6. Any fevers, problems with breathing, unexpected pain? No 7. Are there any skin problems or new areas of breakdown? Diagnose with Candidiasis, see PCP Note.  8. Has the patient/family member arranged specialty MD follow up (ie cardiology/neurology/renal/surgical/etc.)?  Mrs. Sodergren states she has to make the appointment with Dr. Gaspar Garbe in the morning.  a. Can we help arrange? NA 9. Does the patient need any other services or support that we can help arrange? NA 10. Are caregivers following through as expected in assisting the  patient? Yes 11. Has the patient quit smoking, drinking alcohol, or using drugs as recommended? Mrs. Schreib states [name] doesn't smoke, drink alcohol or use illicit drugs.   Appointment date/time 12/04/2017 arrival time 1:00 for 1:20 appointment, with Jacalyn Lefevre ANP-C. At [address] [facility] [address]

## 2017-11-28 ENCOUNTER — Ambulatory Visit (INDEPENDENT_AMBULATORY_CARE_PROVIDER_SITE_OTHER): Payer: Self-pay | Admitting: *Deleted

## 2017-11-28 ENCOUNTER — Ambulatory Visit: Payer: Medicare Other | Admitting: Occupational Therapy

## 2017-11-28 ENCOUNTER — Ambulatory Visit
Payer: Medicare Other | Attending: Physical Medicine & Rehabilitation | Admitting: Rehabilitative and Restorative Service Providers"

## 2017-11-28 ENCOUNTER — Encounter: Payer: Self-pay | Admitting: Rehabilitative and Restorative Service Providers"

## 2017-11-28 ENCOUNTER — Encounter (INDEPENDENT_AMBULATORY_CARE_PROVIDER_SITE_OTHER): Payer: Medicare PPO | Admitting: Vascular Surgery

## 2017-11-28 ENCOUNTER — Other Ambulatory Visit: Payer: Self-pay

## 2017-11-28 DIAGNOSIS — R278 Other lack of coordination: Secondary | ICD-10-CM

## 2017-11-28 DIAGNOSIS — I6340 Cerebral infarction due to embolism of unspecified cerebral artery: Secondary | ICD-10-CM

## 2017-11-28 DIAGNOSIS — R293 Abnormal posture: Secondary | ICD-10-CM | POA: Diagnosis present

## 2017-11-28 DIAGNOSIS — M6281 Muscle weakness (generalized): Secondary | ICD-10-CM | POA: Diagnosis present

## 2017-11-28 DIAGNOSIS — R41842 Visuospatial deficit: Secondary | ICD-10-CM

## 2017-11-28 DIAGNOSIS — R2681 Unsteadiness on feet: Secondary | ICD-10-CM | POA: Diagnosis present

## 2017-11-28 DIAGNOSIS — R2689 Other abnormalities of gait and mobility: Secondary | ICD-10-CM | POA: Diagnosis present

## 2017-11-28 DIAGNOSIS — I69318 Other symptoms and signs involving cognitive functions following cerebral infarction: Secondary | ICD-10-CM | POA: Diagnosis present

## 2017-11-28 DIAGNOSIS — R41841 Cognitive communication deficit: Secondary | ICD-10-CM | POA: Diagnosis present

## 2017-11-28 DIAGNOSIS — I69354 Hemiplegia and hemiparesis following cerebral infarction affecting left non-dominant side: Secondary | ICD-10-CM | POA: Diagnosis present

## 2017-11-28 LAB — CUP PACEART INCLINIC DEVICE CHECK
Date Time Interrogation Session: 20190418165401
MDC IDC PG IMPLANT DT: 20190326

## 2017-11-28 NOTE — Progress Notes (Signed)
Wound Loop check in clinic. Steri-strips removed prior to OV. Incision edges approximated, wound well healed Battery status: Good R-waves 0.57 mV. 0 symptom episodes, 0 tachy episodes, brady and pause alert off at implant. 0 AF episodes. Monthly summary reports and ROV with JA PRN

## 2017-11-28 NOTE — Therapy (Signed)
[facility] [address] [place], [place], [address] Phone: [phone]   Fax:  [phone]  Physical Therapy Evaluation  Patient Details  Name: Thomas [name]. MRN: [id number] Date of Birth: [birth year] Referring Provider: Faith Rogue, MD   Encounter Date: 11/28/2017  PT End of Session - 11/28/17 1117    Visit Number  1    Number of Visits  17 eval + 2x/week for 8 weeks    Date for PT Re-Evaluation  01/27/18    Authorization Type  [employer] medicare     PT Start Time  0850    PT Stop Time  0930    PT Time Calculation (min)  40 min    Equipment Utilized During Treatment  Gait belt    Activity Tolerance  Patient tolerated treatment well    Behavior During Therapy  WFL for tasks assessed/performed       Past Medical History:  Diagnosis Date  . CHF (congestive heart failure) (HCC) 1999  . CKD (chronic kidney disease), stage II   . Coronary artery disease   . Hx of colonic polyps   . Hyperlipidemia   . Hyperplastic colon polyp 04/15/2007  . Hypertension   . Prostatic hypertrophy, benign    with elevated PSA  . TIA (transient ischemic attack) 2003   "mini stroke" (03/19/2013)    Past Surgical History:  Procedure Laterality Date  . CARDIAC CATHETERIZATION    . CATARACT EXTRACTION W/ INTRAOCULAR LENS  IMPLANT, BILATERAL Bilateral 1998  . COLONOSCOPY  2008  . CORONARY ARTERY BYPASS GRAFT  1999   x4  . LOOP RECORDER INSERTION N/A 11/05/2017   Procedure: LOOP RECORDER INSERTION;  Surgeon: Hillis Range, MD;  Location: [facility] INVASIVE CV LAB;  Service: Cardiovascular;  Laterality: N/A;  . TEE WITHOUT CARDIOVERSION N/A 11/05/2017   Procedure: TRANSESOPHAGEAL ECHOCARDIOGRAM (TEE) WITH LOOP;  Surgeon: Lars Masson, MD;  Location[facility] ENDOSCOPY;  Service: Cardiovascular;  Laterality: N/A;    There were no vitals filed for this visit.   Subjective Assessment - 11/28/17 0851    Subjective  The patient presents to OP  PT s/p CVA 11/03/17 with IP rehab  to d/c home on 11/22/17 with 24 hour supervision.  His wife reports this week he has had a "bad medical week" describing a yeast infection, UTI and hives.   At this time, he has 24 hour supervision with his wife assisting with all mobility.      Patient is accompained by:  Family member spouse    Pertinent History  CKD, prior CVA using RW prior to recent admission, HTN, CHF.    Patient Stated Goals  Moving "a whole lot easier than I am now."  Wife notes she wants him to get stronger and get back to where he can have a little more independence.    Currently in Pain?  No/denies         [facility] PT Assessment - 11/28/17 0859      Assessment   Medical Diagnosis  RT CVA (PCA)    Referring Provider  Faith Rogue, MD    Onset Date/Surgical Date  11/03/17    Hand Dominance  Right    Prior Therapy  [facility]-3/27-4/12/19      Precautions   Precautions  Fall    Precaution Comments  loop recorder, no driving      Restrictions   Weight Bearing Restrictions  No      Balance Screen   Has the  patient fallen in the past 6 months  Yes    How many times?  10 estimated by wife    Has the patient had a decrease in activity level because of a fear of falling?   Yes    Is the patient reluctant to leave their home because of a fear of falling?   Yes      Home Environment   Living Environment  Private residence    Living Arrangements  Spouse/significant other    Type of Home  House    Home Access  Stairs to enter    Entrance Stairs-Number of Steps  2    Entrance Stairs-Rails  -- plan to add a handrail in garage    Home Layout  Two level    Home Equipment  Walker - 2 wheels;Wheelchair - [employer] - 4 wheels;Walker - standard;Bedside commode 5 walkers; stair lift    Additional Comments  shower is upstairs      Prior Function   Level of Independence  Independent with basic ADLs;Needs assistance with gait    Vocation  Retired    Leisure  watches TV      Cognition    Overall Cognitive Status  Impaired/Different from baseline      Sensation   Light Touch  Appears Intact      ROM / Strength   AROM / PROM / Strength  AROM;Strength      AROM   Overall AROM Comments  Mildly limited AROM shoulders, postural weakness noted with falling posteriorly.  L knee hyperextends into recurvatum (since first CVA-- wife notes he even had it before the first stroke).      Strength   Strength Assessment Site  Hip;Knee;Ankle    Right/Left Hip  Right;Left    Right Hip Flexion  4/5    Left Hip Flexion  3/5    Right/Left Knee  Right;Left    Right Knee Flexion  4+/5    Right Knee Extension  4+/5    Left Knee Flexion  3+/5    Left Knee Extension  3+/5    Right/Left Ankle  Right;Left    Right Ankle Dorsiflexion  3+/5    Left Ankle Dorsiflexion  4/5      Bed Mobility   Bed Mobility  Supine to Sit;Rolling Right;Rolling Left;Sit to Supine    Rolling Right  5: Supervision    Rolling Left  5: Supervision    Supine to Sit  5: Supervision    Sit to Supine  5: Supervision      Transfers   Transfers  Sit to Stand;Stand to Sit;Stand Pivot Transfers    Sit to Stand  4: Min assist    Stand to Sit  4: Min assist    Stand Pivot Transfers  4: Min assist      Ambulation/Gait   Ambulation/Gait  Yes    Ambulation/Gait Assistance  4: Min assist    Ambulation/Gait Assistance Details  cues on vision to the left/ avoiding door frames and peopl ein clinic,     Ambulation Distance (Feet)  65 Feet x 2    Assistive device  Rolling walker    Gait Pattern  Step-through pattern;Decreased step length - right;Decreased step length - left;Left genu recurvatum;Decreased trunk rotation;Decreased weight shift to left    Stairs  -- to be assessed    Gait Comments  --      Medical sales representative  Yes    Distance  Patient needs  assist as w/c is metal/ heavy chair.  Unable to manage w/c parts independently.      Standardized Balance Assessment   Standardized Balance  Assessment  Berg Balance Test      Berg Balance Test   Sit to Stand  Needs minimal aid to stand or to stabilize    Standing Unsupported  Needs several tries to stand 30 seconds unsupported    Sitting with Back Unsupported but Feet Supported on Floor or Stool  Able to sit safely and securely 2 minutes    Stand to Sit  Needs assistance to sit    Transfers  Needs one person to assist    Standing Unsupported with Eyes Closed  Needs help to keep from falling    Standing Ubsupported with Feet Together  Needs help to attain position and unable to hold for 15 seconds    From Standing, Reach Forward with Outstretched Arm  Loses balance while trying/requires external support    From Standing Position, Pick up Object from Floor  Unable to try/needs assist to keep balance    From Standing Position, Turn to Look Behind Over each Shoulder  Needs assist to keep from losing balance and falling    Turn 360 Degrees  Needs assistance while turning    Standing Unsupported, Alternately Place Feet on Step/Stool  Needs assistance to keep from falling or unable to try    Standing Unsupported, One Foot in [employer] balance while stepping or standing    Standing on One Leg  Unable to try or needs assist to prevent fall    Total Score  7    Berg comment:  7/56      High Level Balance   High Level Balance Activites  --                Objective measurements completed on examination: See above findings.              PT Education - 11/28/17 1117    Education provided  Yes    Education Details  Discussed recommendations for use of standard rolling walker versus rollator and continued 24 hour supervision    Person(s) Educated  Patient    Methods  Explanation;Demonstration;Handout    Comprehension  Verbalized understanding;Returned demonstration       PT Short Term Goals - 11/28/17 1118      PT SHORT TERM GOAL #1   Title  The patient will be indep with HEP for LE strengthening, posture,  stretching and general mobility.    Time  4    Period  Weeks    Target Date  12/28/17      PT SHORT TERM GOAL #2   Title  The patient will improve Berg balance score from 7/56 to > or equal to 16/56 to demonstrate improving independence for standing balance and transfers.    Time  4    Period  Weeks    Target Date  12/28/17      PT SHORT TERM GOAL #3   Title  The patient will move sit<>stand with UE support 5/5 trials mod indep to RW.    Time  4    Period  Weeks    Target Date  12/28/17      PT SHORT TERM GOAL #4   Title  The patient will be further assessed on gait speed and LTG to follow.    Time  4    Period  Weeks  Target Date  12/28/17      PT SHORT TERM GOAL #5   Title  The patient will ambulate x 200 ft nonstop with RW and CGA to demonstrate improving endurance and dec'd caregiver assist.    Time  4    Period  Weeks    Target Date  12/28/17        PT Long Term Goals - 11/28/17 1121      PT LONG TERM GOAL #1   Title  The patient will improve functional status score from 21% to > or equal to 35% to demo improved perception of functional abiltiies.    Time  8    Period  Weeks    Target Date  01/27/18      PT LONG TERM GOAL #2   Title  The patient will improve gait speed *baseline to be established after eval.    Time  8    Period  Weeks    Target Date  01/27/18      PT LONG TERM GOAL #3   Title  The patient will improve Berg score from 7/56 to > or equal to 22/56 to demo improving standing balance for ADLs/functional tasks.    Time  8    Period  Weeks    Target Date  01/27/18      PT LONG TERM GOAL #4   Title  The patient will negotiate 4 steps with one HR with supervision for safe entrance/exit from home.    Time  8    Period  Weeks    Target Date  01/27/18      PT LONG TERM GOAL #5   Title  The patient will ambulate x 350 ft with RW mod indep for improved household and short distance community negotiation to reduce caregiver assist.    Time  8     Period  Weeks    Target Date  01/27/18      Additional Long Term Goals   Additional Long Term Goals  Yes      PT LONG TERM GOAL #6   Title  The patient will move floor<>stand with UE support and min A due to h/o falls.    Time  8    Period  Weeks    Target Date  01/27/18             Plan - 11/28/17 1126    Clinical Impression Statement  The patient is a 77 year old male presenting to OP physical therapy with recent CVA on 11/03/17, d/c'd home on 11/22/17 from IP rehab.  The patient's impairments include:  LE weakness, postural weakness, decreased flexibility, imbalance, cognitive changes, L visual field cut.  Impairments lead to functional limitations of need for 24 hour supervision, assist with gait, assist with transfers, assist with all ADLs.  PT to address impairments to promote improved functional status.    History and Personal Factors relevant to plan of care:  h/o prior CVAs, CKD, HTN, CHF, gait disorder, visual field cut, h/o multiple falls.    Clinical Presentation  Evolving    Clinical Presentation due to:  a decline in function due to recent stroke, currently needs 24 hour supervision    Clinical Decision Making  Moderate    Rehab Potential  Good    PT Frequency  2x / week + evaluation    PT Duration  8 weeks    PT Treatment/Interventions  ADLs/Self Care Home Management;Gait training;Stair training;Functional mobility training;Therapeutic activities;Therapeutic  exercise;Neuromuscular re-education;Balance training;Patient/family education;Orthotic Fit/Training;Manual techniques    PT Next Visit Plan  establish HEP:  sit<>stand, hamstring stretching, seated postural strengthening, heel cord stretch.  Standing balance in clinic working on dec'ing UE support, gait training emphasizing endurance and watching for L visual field cues to avoid environmental obstacles.    Consulted and Agree with Plan of Care  Patient       Patient will benefit from skilled therapeutic  intervention in order to improve the following deficits and impairments:  Abnormal gait, Decreased endurance, Decreased activity tolerance, Decreased strength, Decreased safety awareness, Impaired flexibility, Postural dysfunction, Decreased balance, Decreased mobility  Visit Diagnosis: Other abnormalities of gait and mobility  Muscle weakness (generalized)  Abnormal posture     Problem List Patient Active Problem List   Diagnosis Date Noted  . Foul smelling urine   . Leukocytosis   . Hyponatremia   . 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 Benign essential HTN   . Coronary artery disease involving native coronary artery of native heart without angina pectoris   . Stage 3 chronic kidney disease (HCC)   . Acute lower UTI   . Seasonal allergies   . Stroke (HCC) 11/04/2017  . HLD (hyperlipidemia) 11/04/2017  . Chronic diastolic CHF (congestive heart failure) (HCC) 11/04/2017  . Acute renal failure superimposed on stage 2 chronic kidney disease (HCC) 11/04/2017  . Acute metabolic encephalopathy 11/04/2017  . UTI (urinary tract infection) 11/04/2017  . AKI (acute kidney injury) (HCC)   . History of CVA with residual deficit   . Prediabetes   . Left hemiparesis (HCC) 03/19/2013  . Cerebral infarction (HCC) 03/19/2013  . Left leg weakness 03/19/2013  . CAD, ARTERY BYPASS GRAFT 09/30/2008  . 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 Borderline hyperglycemia 12/03/2007  . Other and unspecified hyperlipidemia 07/28/2007  . Essential hypertension 07/28/2007  . 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 Coronary atherosclerosis 07/28/2007  . BENIGN PROSTATIC HYPERTROPHY 07/28/2007  . COLONIC POLYPS, 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    Supreme Rybarczyk, PT 11/28/2017, 11:32 AM  [place] [facility] [address] [place], [place], [address] Phone: [phone]   Fax:  [phone]  Name: Kojo Liby. MRN: [id number] Date of Birth:  12/20/1940

## 2017-11-28 NOTE — Therapy (Signed)
[facility] [address] [place], [place], [address] Phone: [phone]   Fax:  [phone]  Occupational Therapy Evaluation  Patient Details  Name: Thomas [name]. MRN: [id number] Date of Birth: 01-01-1941 Referring Provider: Faith Rogue, MD   Encounter Date: 11/28/2017  OT End of Session - 11/28/17 1250    Visit Number  1    Number of Visits  17    Date for OT Re-Evaluation  01/28/18    Authorization Type  [employer] MCR    OT Start Time  0800    OT Stop Time  0850    OT Time Calculation (min)  50 min    Activity Tolerance  Patient tolerated treatment well       Past Medical History:  Diagnosis Date  . CHF (congestive heart failure) (HCC) 1999  . CKD (chronic kidney disease), stage II   . Coronary artery disease   . Hx of colonic polyps   . Hyperlipidemia   . Hyperplastic colon polyp 04/15/2007  . Hypertension   . Prostatic hypertrophy, benign    with elevated PSA  . TIA (transient ischemic attack) 2003   "mini stroke" (03/19/2013)    Past Surgical History:  Procedure Laterality Date  . CARDIAC CATHETERIZATION    . CATARACT EXTRACTION W/ INTRAOCULAR LENS  IMPLANT, BILATERAL Bilateral 1998  . COLONOSCOPY  2008  . CORONARY ARTERY BYPASS GRAFT  1999   x4  . LOOP RECORDER INSERTION N/A 11/05/2017   Procedure: LOOP RECORDER INSERTION;  Surgeon: Hillis Range, MD;  Location: [facility] INVASIVE [facility];  Service: Cardiovascular;  Laterality: N/A;  . TEE WITHOUT CARDIOVERSION N/A 11/05/2017   Procedure: TRANSESOPHAGEAL ECHOCARDIOGRAM (TEE) WITH LOOP;  Surgeon: Lars Masson, MD;  Location: [facility] ENDOSCOPY;  Service: Cardiovascular;  Laterality: N/A;    There were no vitals filed for this visit.  Subjective Assessment - 11/28/17 0807    Patient is accompained by:  Family member Wife [name])    Pertinent History  Rt CVA 11/03/17. PMH: HTN, CHF, CKD stage 2, previous CVA 2014 with residual Lt LE weakness    Patient  Stated Goals  Get back to doing things I used to do    Currently in Pain?  No/denies        [facility] OT Assessment - 11/28/17 0001      Assessment   Medical Diagnosis  Rt CVA (PCA) Lt hemiplegia    Referring Provider  Dr. Dow Adolph    Onset Date/Surgical Date  11/03/17    Hand Dominance  Right    Prior Therapy  CIR 3/27 - 11/22/17      Precautions   Precautions  Fall    Precaution Comments  Loop recorder, no driving      Restrictions   Weight Bearing Restrictions  No      Balance Screen   Has the patient fallen in the past 6 months  Yes    How many times?  -- less than 10      Home  Environment   Bathroom Shower/Tub  Tub/Shower unit;Curtain    Home Equipment  Wheelchair - manual;Walker - 2 wheels;Bedside commode;Tub bench;Grab bars - tub/shower;Grab bars - toilet rollator    Additional Comments  Pt lives with wife in 2 story home, bedroom and 1/2 bath on 1st floor. Has chair lift for bathing upstairs.     Lives With  Spouse      Prior Function   Level of Independence  Independent  with basic ADLs;Needs assistance with gait need assist w/ tub transfers/leg management     Vocation  Retired    Leisure  Watches TV      ADL   Eating/Feeding  Needs assist with cutting food    Grooming  Modified independent    Upper Body Bathing  Supervision/safety    Lower Body Bathing  Supervision/safety    Upper Body Dressing  Set up    Lower Body Dressing  Moderate assistance    Toilet Transfer  Moderate assistance    Toileting - Clothing Manipulation  Maximal assistance    Toileting -  Patent examiner bench    ADL comments  Dependent for IADLS at this time.       Mobility   Mobility Status  Needs assist    Mobility Status Comments  Pt uses walker w/ assist in house. Pt using w/c in community      Written Expression   Dominant Hand  Right    Handwriting  -- denies change except d/t  vision      Vision - History   Baseline Vision  Wears glasses for distance only    Visual History  -- cataract surgery both eyes      Vision Assessment   Visual Fields  Left visual field deficit      Cognition   Memory  Impaired    Cognition Comments  Pt poor historian. Wife reports pt was independent w/ medication management prior to this stroke, but unable now      Observation/Other Assessments   Observations  Pt with decr. memory, poor historian, Lt visual field cut (? homonymous hemianopsia), Lt inattention, HOH bilateral ears. Pt tends to fall backwards      Sensation   Light Touch  Appears Intact      Coordination   9 Hole Peg Test  Right;Left    Right 9 Hole Peg Test  85.87 sec    Left 9 Hole Peg Test  105 sec      Perception   Perception  Impaired    Inattention/Neglect  Does not attend to left visual field      Edema   Edema  none      ROM / Strength   AROM / PROM / Strength  AROM      AROM   Overall AROM Comments  RUE AROM WNL's. LUE WFL's except lacks end range shoulder flexion and abduction (approx. 90%)       Hand Function   Right Hand Grip (lbs)  68 lbs    Left Hand Grip (lbs)  64 lbs                        OT Short Term Goals - 11/28/17 1257      OT SHORT TERM GOAL #1   Title  Pt/family will verbalize understanding with visual scanning strategies and memory compensatory strategies    Time  4    Period  Weeks    Status  New    Target Date  12/28/17      OT SHORT TERM GOAL #2   Title  Pt/family will verbalize understanding with coordination HEP for bilateral hands    Time  4    Period  Weeks    Status  New      OT SHORT TERM GOAL #3  Title  Pt will perform LE dressing with min assist    Time  4    Period  Weeks    Status  New      OT SHORT TERM GOAL #4   Title  Pt will perform toilet transfer with min assist and clothes management with no more than mod assist    Time  4    Period  Weeks    Status  New      OT SHORT  TERM GOAL #5   Title  Pt will attend to Lt side for near body and tabletop scanning with > 75% accuracy    Time  4    Period  Weeks    Status  New        OT Long Term Goals - 11/28/17 1301      OT LONG TERM GOAL #1   Title  Independent with updated HEP for UE's    Time  8    Period  Weeks    Status  New      OT LONG TERM GOAL #2   Title  Pt will perform LE dressing with set up/sup only    Time  8    Period  Weeks    Status  New      OT LONG TERM GOAL #3   Title  Pt will perform toilet transfer and tub transfer with close sup only using DME prn    Time  8    Period  Weeks    Status  New      OT LONG TERM GOAL #4   Title  Pt will perform clothes management after toileting mod I level    Time  8    Period  Weeks    Status  New      OT LONG TERM GOAL #5   Title  Pt will perform simple snack prep at mod I level using rollator or tray on walker    Time  8    Period  Weeks    Status  New      Long Term Additional Goals   Additional Long Term Goals  Yes      OT LONG TERM GOAL #6   Title  Pt to perform medication management with direct sup/min cueing prn    Time  8    Period  Weeks    Status  New            Plan - 11/28/17 1251    Clinical Impression Statement  Pt is a 77 y.o. male who presents to outpatient rehab s/p RT PCA CVA on 11/03/17 with Lt hemiplegia (LE > UE) and visual field cut to Lt side. Pt also with decreased attention to Lt side, decreased balance, and decreased cognition since stroke. Pt's wife reports pt was using walker prior to recent CVA. Pt poor historian    Occupational Profile and client history currently impacting functional performance  PMH: HTN, CHF, CKD (stage 2), previous Rt CVA 2014, HOH    Occupational performance deficits (Please refer to evaluation for details):  ADL's;IADL's;Leisure;Social Participation    Rehab Potential  Fair    Current Impairments/barriers affecting progress:  severity of deficits including cognition and  decreased attention and field cut to Lt side    OT Frequency  2x / week    OT Duration  8 weeks plus evaluation    OT Treatment/Interventions  Self-care/ADL training;DME and/or AE instruction;Moist Heat;Aquatic Therapy;Therapeutic activities;Therapeutic exercise;Cognitive remediation/compensation;Coping  strategies training;Neuromuscular education;Functional Mobility Training;Passive range of motion;Visual/perceptual remediation/compensation;Manual Therapy;Patient/family education    Plan  visual scanning strategies, memory strategies    Clinical Decision Making  Several treatment options, min-mod task modification necessary    Consulted and Agree with Plan of Care  Patient       Patient will benefit from skilled therapeutic intervention in order to improve the following deficits and impairments:  Decreased coordination, Decreased range of motion, Difficulty walking, Improper body mechanics, Decreased endurance, Decreased safety awareness, Decreased activity tolerance, Decreased knowledge of precautions, Impaired tone, Impaired UE functional use, Decreased knowledge of use of DME, Decreased balance, Decreased cognition, Decreased mobility, Decreased strength, Impaired perceived functional ability, Impaired vision/preception  Visit Diagnosis: Hemiplegia and hemiparesis following cerebral infarction affecting left non-dominant side (HCC) - Plan: Ot plan of care cert/re-cert  Muscle weakness (generalized) - Plan: Ot plan of care cert/re-cert  Unsteadiness on feet - Plan: Ot plan of care cert/re-cert  Other lack of coordination - Plan: Ot plan of care cert/re-cert  Other symptoms and signs involving cognitive functions following cerebral infarction - Plan: Ot plan of care cert/re-cert  Visuospatial deficit - Plan: Ot plan of care cert/re-cert    Problem List Patient Active Problem List   Diagnosis Date Noted  . Foul smelling urine   . Leukocytosis   . Hyponatremia   . Acute right PCA  stroke (HCC) 11/06/2017  . Benign essential HTN   . Coronary artery disease involving native coronary artery of native heart without angina pectoris   . Stage 3 chronic kidney disease (HCC)   . Acute lower UTI   . Seasonal allergies   . Stroke (HCC) 11/04/2017  . HLD (hyperlipidemia) 11/04/2017  . Chronic diastolic CHF (congestive heart failure) (HCC) 11/04/2017  . Acute renal failure superimposed on stage 2 chronic kidney disease (HCC) 11/04/2017  . Acute metabolic encephalopathy 11/04/2017  . UTI (urinary tract infection) 11/04/2017  . AKI (acute kidney injury) (HCC)   . History of CVA with residual deficit   . Prediabetes   . Left hemiparesis (HCC) 03/19/2013  . Cerebral infarction (HCC) 03/19/2013  . Left leg weakness 03/19/2013  . CAD, ARTERY BYPASS GRAFT 09/30/2008  . 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 Borderline hyperglycemia 12/03/2007  . Other and unspecified hyperlipidemia 07/28/2007  . Essential hypertension 07/28/2007  . 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 Coronary atherosclerosis 07/28/2007  . BENIGN PROSTATIC HYPERTROPHY 07/28/2007  . COLONIC POLYPS, 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    Kelli Churn, OTR/L 11/28/2017, 1:07 PM  [place] [facility] [address] [place], [place], [address] Phone: [phone]   Fax:  [phone]  Name: Trini Christiansen. MRN: [id number] Date of Birth: May 25, 1941

## 2017-11-29 ENCOUNTER — Telehealth: Payer: Self-pay | Admitting: Adult Health

## 2017-11-29 ENCOUNTER — Ambulatory Visit: Payer: Self-pay | Admitting: *Deleted

## 2017-11-29 NOTE — Telephone Encounter (Signed)
Patient's wife is calling to report her husband's left hand has some swelling this morning.This is new and has not happened before- he has no other symptoms- no bite indication, no pitting, no fever, no numbness, no other symptoms reported. They are going to elevate and call the office to report if the swelling has not gone away in a couple hours- could possibly be positional-otherwise patient will need to be seen for evaluation of new symptom. Answer Assessment - Initial Assessment Questions 1. REASON FOR CALL: "What is your main concern right now?"     Swelling in left hand and wrist 2. ONSET: "When did the ___ start?"     This morning 3. SEVERITY: "How bad is the ___?"     No pitting in hand- edema in fleshy part of hand and at wrist 4. FEVER: "Do you have a fever?"     No - 97.8 5. OTHER SYMPTOMS: "Do you have any other new symptoms?"     No- no numbness or tingling, no other swelling in body, no other symptoms reported 6. INTERVENTIONS AND RESPONSE: "What have you done so far to try to make this better? What medications have you used?"     Nothing so far. 7. PREGNANCY: "Is there any chance you are pregnant?"     n/a  Protocols used: NO GUIDELINE AVAILABLE-A-AH

## 2017-11-29 NOTE — Telephone Encounter (Signed)
Patient was given medication for hives- he is having swelling in left arm and hand. Patient started Flomax- at hospital. He is taking fluconazole for hives.

## 2017-11-29 NOTE — Telephone Encounter (Signed)
Phone call returned to pt's wife to f/u on swelling of his left hand.  Reported the swelling is improved from earlier today. Reported that there is only slight swelling in the thumb and 1st finger.  Stated both hands are comparable in size.  Questioned about any left shoulder or axillary pain or arm pain; wife denied this. Denied any c/o numbness.  Encouraged to continue to monitor this, and to elevate at intervals.  Advised to monitor for signs of worsening swelling, pain, distended neck veins, distended veins in arm, or discoloration of skin of left upper extremity. Advised if worsening symptoms, to call back, or take to ER.  Wife verb. Understanding.

## 2017-11-29 NOTE — Telephone Encounter (Signed)
Copied from CRM [id number]. Topic: Quick Communication - See Telephone Encounter >> Nov 29, 2017 12:42 PM Clack, Princella Pellegrini wrote: CRM for notification. See Telephone encounter for: 11/29/17.  Pt wife [name] called and wanted to let the nurse know the pt still has swelling in his hands.

## 2017-12-01 ENCOUNTER — Observation Stay (HOSPITAL_COMMUNITY)
Admission: EM | Admit: 2017-12-01 | Discharge: 2017-12-03 | Disposition: A | Discharge status: Home / Self Care | Payer: Medicare Other | Attending: Internal Medicine | Admitting: Internal Medicine

## 2017-12-01 ENCOUNTER — Other Ambulatory Visit: Payer: Self-pay

## 2017-12-01 ENCOUNTER — Encounter (HOSPITAL_COMMUNITY): Payer: Self-pay | Admitting: Emergency Medicine

## 2017-12-01 DIAGNOSIS — Z951 Presence of aortocoronary bypass graft: Secondary | ICD-10-CM | POA: Diagnosis not present

## 2017-12-01 DIAGNOSIS — Z7902 Long term (current) use of antithrombotics/antiplatelets: Secondary | ICD-10-CM | POA: Diagnosis not present

## 2017-12-01 DIAGNOSIS — Z8673 Personal history of transient ischemic attack (TIA), and cerebral infarction without residual deficits: Secondary | ICD-10-CM | POA: Insufficient documentation

## 2017-12-01 DIAGNOSIS — E785 Hyperlipidemia, unspecified: Secondary | ICD-10-CM | POA: Diagnosis not present

## 2017-12-01 DIAGNOSIS — I2510 Atherosclerotic heart disease of native coronary artery without angina pectoris: Secondary | ICD-10-CM | POA: Insufficient documentation

## 2017-12-01 DIAGNOSIS — R338 Other retention of urine: Secondary | ICD-10-CM | POA: Insufficient documentation

## 2017-12-01 DIAGNOSIS — Z9841 Cataract extraction status, right eye: Secondary | ICD-10-CM | POA: Insufficient documentation

## 2017-12-01 DIAGNOSIS — I130 Hypertensive heart and chronic kidney disease with heart failure and stage 1 through stage 4 chronic kidney disease, or unspecified chronic kidney disease: Secondary | ICD-10-CM | POA: Insufficient documentation

## 2017-12-01 DIAGNOSIS — Z9842 Cataract extraction status, left eye: Secondary | ICD-10-CM | POA: Diagnosis not present

## 2017-12-01 DIAGNOSIS — I509 Heart failure, unspecified: Secondary | ICD-10-CM | POA: Insufficient documentation

## 2017-12-01 DIAGNOSIS — I5032 Chronic diastolic (congestive) heart failure: Secondary | ICD-10-CM | POA: Diagnosis present

## 2017-12-01 DIAGNOSIS — Z87891 Personal history of nicotine dependence: Secondary | ICD-10-CM | POA: Insufficient documentation

## 2017-12-01 DIAGNOSIS — N183 Chronic kidney disease, stage 3 unspecified: Secondary | ICD-10-CM | POA: Diagnosis present

## 2017-12-01 DIAGNOSIS — N401 Enlarged prostate with lower urinary tract symptoms: Secondary | ICD-10-CM | POA: Diagnosis not present

## 2017-12-01 DIAGNOSIS — Z7951 Long term (current) use of inhaled steroids: Secondary | ICD-10-CM | POA: Diagnosis not present

## 2017-12-01 DIAGNOSIS — I10 Essential (primary) hypertension: Secondary | ICD-10-CM | POA: Diagnosis present

## 2017-12-01 DIAGNOSIS — D72829 Elevated white blood cell count, unspecified: Secondary | ICD-10-CM | POA: Diagnosis present

## 2017-12-01 DIAGNOSIS — Z961 Presence of intraocular lens: Secondary | ICD-10-CM | POA: Diagnosis not present

## 2017-12-01 DIAGNOSIS — Z8249 Family history of ischemic heart disease and other diseases of the circulatory system: Secondary | ICD-10-CM | POA: Insufficient documentation

## 2017-12-01 DIAGNOSIS — Z823 Family history of stroke: Secondary | ICD-10-CM | POA: Insufficient documentation

## 2017-12-01 DIAGNOSIS — Z882 Allergy status to sulfonamides status: Secondary | ICD-10-CM | POA: Diagnosis not present

## 2017-12-01 DIAGNOSIS — Z7982 Long term (current) use of aspirin: Secondary | ICD-10-CM | POA: Insufficient documentation

## 2017-12-01 DIAGNOSIS — Z79899 Other long term (current) drug therapy: Secondary | ICD-10-CM | POA: Diagnosis not present

## 2017-12-01 DIAGNOSIS — N390 Urinary tract infection, site not specified: Principal | ICD-10-CM | POA: Insufficient documentation

## 2017-12-01 DIAGNOSIS — B9689 Other specified bacterial agents as the cause of diseases classified elsewhere: Secondary | ICD-10-CM | POA: Diagnosis not present

## 2017-12-01 LAB — CBC WITH DIFFERENTIAL/PLATELET
Basophils Absolute: 0 10*3/uL (ref 0.0–0.1)
Basophils Relative: 0 %
Eosinophils Absolute: 1.3 10*3/uL — ABNORMAL HIGH (ref 0.0–0.7)
Eosinophils Relative: 10 %
HEMATOCRIT: 38.1 % — AB (ref 39.0–52.0)
HEMOGLOBIN: 12.8 g/dL — AB (ref 13.0–17.0)
LYMPHS ABS: 2.4 10*3/uL (ref 0.7–4.0)
Lymphocytes Relative: 18 %
MCH: 31.1 pg (ref 26.0–34.0)
MCHC: 33.6 g/dL (ref 30.0–36.0)
MCV: 92.5 fL (ref 78.0–100.0)
Monocytes Absolute: 0.5 10*3/uL (ref 0.1–1.0)
Monocytes Relative: 4 %
NEUTROS ABS: 8.9 10*3/uL — AB (ref 1.7–7.7)
Neutrophils Relative %: 68 %
Platelets: 255 10*3/uL (ref 150–400)
RBC: 4.12 MIL/uL — ABNORMAL LOW (ref 4.22–5.81)
RDW: 13.4 % (ref 11.5–15.5)
WBC: 13.1 10*3/uL — ABNORMAL HIGH (ref 4.0–10.5)

## 2017-12-01 LAB — COMPREHENSIVE METABOLIC PANEL
ALT: 13 U/L — ABNORMAL LOW (ref 17–63)
AST: 32 U/L (ref 15–41)
Albumin: 2.9 g/dL — ABNORMAL LOW (ref 3.5–5.0)
Alkaline Phosphatase: 89 U/L (ref 38–126)
Anion gap: 10 (ref 5–15)
BUN: 23 mg/dL — ABNORMAL HIGH (ref 6–20)
CO2: 23 mmol/L (ref 22–32)
Calcium: 8.3 mg/dL — ABNORMAL LOW (ref 8.9–10.3)
Chloride: 104 mmol/L (ref 101–111)
Creatinine, Ser: 1.32 mg/dL — ABNORMAL HIGH (ref 0.61–1.24)
GFR calc Af Amer: 59 mL/min — ABNORMAL LOW (ref >60)
GFR calc non Af Amer: 51 mL/min — ABNORMAL LOW (ref >60)
Glucose, Bld: 113 mg/dL — ABNORMAL HIGH (ref 65–99)
Potassium: 4.8 mmol/L (ref 3.5–5.1)
Sodium: 137 mmol/L (ref 135–145)
Total Bilirubin: 1.1 mg/dL (ref 0.3–1.2)
Total Protein: 5.8 g/dL — ABNORMAL LOW (ref 6.5–8.1)

## 2017-12-01 LAB — URINALYSIS, ROUTINE W REFLEX MICROSCOPIC
Bilirubin Urine: NEGATIVE
Glucose, UA: NEGATIVE mg/dL
Hgb urine dipstick: NEGATIVE
Ketones, ur: NEGATIVE mg/dL
Nitrite: NEGATIVE
PH: 5 (ref 5.0–8.0)
Protein, ur: NEGATIVE mg/dL
SPECIFIC GRAVITY, URINE: 1.019 (ref 1.005–1.030)

## 2017-12-01 LAB — I-STAT CG4 LACTIC ACID, ED: Lactic Acid, Venous: 1.23 mmol/L (ref 0.5–1.9)

## 2017-12-01 MED ORDER — SODIUM CHLORIDE 0.9 % IV SOLN
1.0000 g | Freq: Two times a day (BID) | INTRAVENOUS | Status: DC
  Administered 2017-12-02 (×2): 1 g via INTRAVENOUS
  Filled 2017-12-01 (×4): qty 1

## 2017-12-01 MED ORDER — SODIUM CHLORIDE 0.9 % IV SOLN: 2.0000 g | Freq: Once | INTRAVENOUS | Status: DC

## 2017-12-01 MED ORDER — SODIUM CHLORIDE 0.9 % IV BOLUS
1000.0000 mL | Freq: Once | INTRAVENOUS | Status: AC
  Administered 2017-12-02: 1000 mL via INTRAVENOUS

## 2017-12-01 NOTE — ED Triage Notes (Signed)
Pt c/o of urinary since 0700 today. Pt has tried multiple times with no success. Pt has Hx of BPH. Pt was seen at [facility] 2 weeks ago and was straight cathed twice during admission.  Pt has had not problem urinating until today.

## 2017-12-01 NOTE — H&P (Signed)
History and Physical    Thomas [name]. YQM:[id number] DOB: Dec 07, 1940 DOA: 12/01/2017  Referring MD/NP/PA: Dr Vinnie Level  PCP: Shirline Frees, NP   Outpatient Specialists: Charlton Haws, MD  Patient coming from: Home  Chief Complaint: urinary retention  HPI: Thomas [name]. is a 77 y.o. male with medical history significant of recent CVA who was in rehabilitation for 2 weeks and just Discharged last week. Patient had UTI while in the hospital with cultures obtained at the time. Results came back after discharge showing Citrobacter Braakii. This was resistant to ceftriaxone but sensitive to quinolones as well as imipenem and nitrofurantoin. Patient given nitrofurantoin for 5 days which was completed yesterday. He came in because he was unable to urinate.No fever no chills but generally weak. He has been going to outpatient physical therapy but "not to eat as he wished to do. Yesterday he fell out of bed at home as well. Wife had to call EMS.  ED Course: patient is afebrile in the ER ith temperature 97.9. Heart rate is 126 with blood pressure 113/95. White cell count 13.1 hemoglobin 12.8 with a BUN of 23 and creatinine 1.32. Lactic acid only 1.23. Urinalysis showed RBCs 0-5 wbc's too numerous to count with many bacteria  Review of Systems: As per HPI otherwise 10 point review of systems negative.    Past Medical History:  Diagnosis Date  . CHF (congestive heart failure) (HCC) 1999  . CKD (chronic kidney disease), stage II   . Coronary artery disease   . Hx of colonic polyps   . Hyperlipidemia   . Hyperplastic colon polyp 04/15/2007  . Hypertension   . Prostatic hypertrophy, benign    with elevated PSA  . TIA (transient ischemic attack) 2003   "mini stroke" (03/19/2013)    Past Surgical History:  Procedure Laterality Date  . CARDIAC CATHETERIZATION    . CATARACT EXTRACTION W/ INTRAOCULAR LENS  IMPLANT, BILATERAL Bilateral 1998  . COLONOSCOPY  2008  . CORONARY ARTERY BYPASS  GRAFT  1999   x4  . LOOP RECORDER INSERTION N/A 11/05/2017   Procedure: LOOP RECORDER INSERTION;  Surgeon: Hillis Range, MD;  Location: [facility] INVASIVE CV LAB;  Service: Cardiovascular;  Laterality: N/A;  . TEE WITHOUT CARDIOVERSION N/A 11/05/2017   Procedure: TRANSESOPHAGEAL ECHOCARDIOGRAM (TEE) WITH LOOP;  Surgeon: Lars Masson, MD;  Location[facility] ENDOSCOPY;  Service: Cardiovascular;  Laterality: N/A;     reports that he quit smoking about 4 years ago. His smoking use included cigarettes. He has a 37.50 pack-year smoking history. He has never used smokeless tobacco. He reports that he drinks about 0.6 oz of alcohol per week. He reports that he does not use drugs.  Allergies  Allergen Reactions  . Sulfonamide Derivatives     REACTION: Weak,lethargic    Family History  Problem Relation Age of Onset  . Coronary artery disease Mother   . Stroke Father     Prior to Admission medications   Medication Sig Start Date End Date Taking? Authorizing Provider  acetaminophen (TYLENOL) 325 MG tablet Take 1-2 tablets (325-650 mg total) by mouth every 4 (four) hours as needed for mild pain. 11/19/17  Yes Love, Evlyn Kanner, PA-C  aspirin EC 325 MG EC tablet Take 1 tablet (325 mg total) by mouth daily. 11/06/17  Yes Vann, Jessica U, DO  atorvastatin (LIPITOR) 80 MG tablet Take 1 tablet (80 mg total) by mouth daily. Patient taking differently: Take 80 mg by mouth daily at 6 PM.  11/06/17  Yes Marlin Canary U, DO  clopidogrel (PLAVIX) 75 MG tablet TAKE 1 TABLET BY MOUTH  DAILY WITH BREAKFAST Patient taking differently: TAKE 1 TABLET (75mg ) BY MOUTH  DAILY WITH BREAKFAST 06/10/17  Yes Wendall Stade, MD  Flax OIL Take 1 capsule by mouth daily.     Yes [provider]  fluconazole (DIFLUCAN) 100 MG tablet Take 1 tablet (100 mg total) by mouth 2 (two) times daily. 11/25/17  Yes Nelwyn Salisbury, MD  fluticasone Aleda Grana) 50 MCG/ACT nasal spray Place 2 sprays into both nostrils daily as needed for allergies  or rhinitis. 11/19/17  Yes Love, Evlyn Kanner, PA-C  folic acid (FOLVITE) 1 MG tablet TAKE 1 TABLET EVERY DAY Patient taking differently: TAKE 1 TABLET (1mg ) by mouth EVERY DAY 01/28/17  Yes Wendall Stade, MD  hydrocerin (EUCERIN) CREA Apply 1 application topically 2 (two) times daily. 11/19/17  Yes Love, Evlyn Kanner, PA-C  ketoconazole (NIZORAL) 2 % cream Apply 1 application topically 2 (two) times daily. 11/25/17  Yes Nelwyn Salisbury, MD  loratadine (CLARITIN) 10 MG tablet Take 10 mg by mouth at bedtime.    Yes [provider]  metoprolol tartrate (LOPRESSOR) 50 MG tablet TAKE 1 TABLET BY MOUTH TWO  TIMES DAILY Patient taking differently: TAKE 1 TABLET (50mg ) BY MOUTH TWO  TIMES DAILY 01/28/17  Yes Wendall Stade, MD  Omega-3 Fatty Acids (FISH OIL) 1000 MG CAPS Take 1 capsule by mouth daily.     Yes [provider]  tamsulosin (FLOMAX) 0.4 MG CAPS capsule Take 1 capsule (0.4 mg total) by mouth daily after supper. 11/21/17  Yes Love, Evlyn Kanner, PA-C  nitrofurantoin, macrocrystal-monohydrate, (MACROBID) 100 MG capsule Take 100 mg by mouth 2 (two) times daily. 11/25/17   [provider]  sodium chloride (OCEAN) 0.65 % SOLN nasal spray Place 1 spray into both nostrils 2 (two) times daily before lunch and supper. Patient not taking: Reported on 12/01/2017 11/19/17   Jacquelynn Cree, [place]    Physical Exam: Vitals:   12/01/17 2030 12/01/17 2045 12/01/17 2100 12/01/17 2115  BP: 122/70 121/74 129/75 (!) 131/95  Pulse: (!) 113 (!) 111 (!) 112 (!) 126  Resp: 15 18 18 18   Temp:      TempSrc:      SpO2: 95% 95% 94% 97%  Weight:      Height:          Constitutional: NAD, calm, comfortable Vitals:   12/01/17 2030 12/01/17 2045 12/01/17 2100 12/01/17 2115  BP: 122/70 121/74 129/75 (!) 131/95  Pulse: (!) 113 (!) 111 (!) 112 (!) 126  Resp: 15 18 18 18   Temp:      TempSrc:      SpO2: 95% 95% 94% 97%  Weight:      Height:       Eyes: PERRL, lids and conjunctivae normal ENMT: Mucous  membranes are moist. Posterior pharynx clear of any exudate or lesions.Normal dentition.  Neck: normal, supple, no masses, no thyromegaly Respiratory: clear to auscultation bilaterally, no wheezing, no crackles. Normal respiratory effort. No accessory muscle use.  Cardiovascular: Regular rate and rhythm, no murmurs / rubs / gallops. No extremity edema. 2+ pedal pulses. No carotid bruits.  Abdomen: no tenderness, no masses palpated. No hepatosplenomegaly. Bowel sounds positive.  Musculoskeletal: no clubbing / cyanosis. No joint deformity upper and lower extremities. Good ROM, no contractures. Normal muscle tone.  Skin: multiple rashes in the perineal area was mild skin breakdown Neurologic: CN 2-12 grossly  intact. Sensation intact, DTR normal. Strength 5/5 in all 4.  Psychiatric: Normal judgment and insight. Alert and oriented x 3. Normal mood.   Labs on Admission: I have personally reviewed following labs and imaging studies  CBC: Recent Labs  Lab 12/01/17 2126  WBC 13.1*  NEUTROABS 8.9*  HGB 12.8*  HCT 38.1*  MCV 92.5  PLT 255   Basic Metabolic Panel: Recent Labs  Lab 12/01/17 2126  NA 137  K 4.8  CL 104  CO2 23  GLUCOSE 113*  BUN 23*  CREATININE 1.32*  CALCIUM 8.3*   GFR: Estimated Creatinine Clearance: 46.1 mL/min (A) (by C-G formula based on SCr of 1.32 mg/dL (H)). Liver Function Tests: Recent Labs  Lab 12/01/17 2126  AST 32  ALT 13*  ALKPHOS 89  BILITOT 1.1  PROT 5.8*  ALBUMIN 2.9*   No results for input(s): LIPASE, AMYLASE in the last 168 hours. No results for input(s): AMMONIA in the last 168 hours. Coagulation Profile: No results for input(s): INR, PROTIME in the last 168 hours. Cardiac Enzymes: No results for input(s): CKTOTAL, CKMB, CKMBINDEX, TROPONINI in the last 168 hours. BNP (last 3 results) No results for input(s): PROBNP in the last 8760 hours. HbA1C: No results for input(s): HGBA1C in the last 72 hours. CBG: No results for input(s): GLUCAP  in the last 168 hours. Lipid Profile: No results for input(s): CHOL, HDL, LDLCALC, TRIG, CHOLHDL, LDLDIRECT in the last 72 hours. Thyroid Function Tests: No results for input(s): TSH, T4TOTAL, FREET4, T3FREE, THYROIDAB in the last 72 hours. Anemia Panel: No results for input(s): VITAMINB12, FOLATE, FERRITIN, TIBC, IRON, RETICCTPCT in the last 72 hours. Urine analysis:    Component Value Date/Time   COLORURINE YELLOW 12/01/2017 1904   APPEARANCEUR HAZY (A) 12/01/2017 1904   LABSPEC 1.019 12/01/2017 1904   PHURINE 5.0 12/01/2017 1904   GLUCOSEU NEGATIVE 12/01/2017 1904   HGBUR NEGATIVE 12/01/2017 1904   BILIRUBINUR NEGATIVE 12/01/2017 1904   BILIRUBINUR neg 06/28/2014 0931   KETONESUR NEGATIVE 12/01/2017 1904   PROTEINUR NEGATIVE 12/01/2017 1904   UROBILINOGEN 1.0 06/28/2014 0931   UROBILINOGEN 1.0 03/19/2013 1142   NITRITE NEGATIVE 12/01/2017 1904   LEUKOCYTESUR LARGE (A) 12/01/2017 1904   Sepsis Labs: @LABRCNTIP (procalcitonin:4,lacticidven:4) )No results found for this or any previous visit (from the past 240 hour(s)).   Radiological Exams on Admission: No results found.    Assessment/Plan Principal Problem:   UTI (urinary tract infection) Active Problems:   Essential hypertension   Chronic diastolic CHF (congestive heart failure) (HCC)   History of CVA with residual deficit   Stage 3 chronic kidney disease (HCC)   Leukocytosis   #1 urinary retention: Patient has been dry with no significant urine in bladder. We may attempt to put Foley catheter if still not able to go. He has history of BPH with Flomax started prior to hospitalization. A bladder scan will be done and if required we'll get urology consult  #2 UTI: Patient has been initiated on Rocephin in the ER. His previous organism is resistant to Rocephin. I will switch him to IV Cipro or meropenem.  #3 chronic diastolic CHF: Patient appears compensated. Continue to monitor  #4 recent hives: Patient's skin is  sloughing. He has widespread rashes all over the trunks. We will continue to monitor and continue Diflucan.  #5 perineal yeast infection: Continue antifungals.  #6 history of CVA: Patient has residual effect from the CVA but improving.  #7 chronic kidney disease stage III: Renal function appeared to be  at baseline.   DVT prophylaxis: heparin   Code Status: full code   Family Communication: wife at bedside  Disposition Plan: to be determined  Consults called: None  Admission status: Inpatient medical   Severity of Illness: The appropriate patient status for this patient is INPATIENT. Inpatient status is judged to be reasonable and necessary in order to provide the required intensity of service to ensure the patient's safety. The patient's presenting symptoms, physical exam findings, and initial radiographic and laboratory data in the context of their chronic comorbidities is felt to place them at high risk for further clinical deterioration. Furthermore, it is not anticipated that the patient will be medically stable for discharge from the hospital within 2 midnights of admission. The following factors support the patient status of inpatient.   " The patient's presenting symptoms include foul-smelling urine with fever. " The worrisome physical exam findings include excoriations of the perineum with UTI. " The initial radiographic and laboratory data are worrisome because of UTI on urinalysis. " The chronic co-morbidities include recent CVA.   * I certify that at the point of admission it is my clinical judgment that the patient will require inpatient hospital care spanning beyond 2 midnights from the point of admission due to high intensity of service, high risk for further deterioration and high frequency of surveillance required.Lonia Blood MD [facility] Pager [phone]  If 7PM-7AM, please contact night-coverage [email or web address] Password [facility]  12/01/2017, 10:47  PM

## 2017-12-01 NOTE — ED Provider Notes (Signed)
[name] EMERGENCY DEPARTMENT Provider Note   CSN: [id number] Arrival date & time: 12/01/17  1443     History   Chief Complaint Chief Complaint  Patient presents with  . Urinary Retention    since 0700    HPI Thomas [name]. is a 77 y.o. male.  HPI   Pt is a 77 yo male with ho CHF, hospitalized last month for stroke and spent 2 weeks in rehab.  Discharge from hospital.  Patient had hives while he was in the hospital and those have been resolving.  Patient had urinary symptoms as an outpatient pt recently treated nitrofuritan, ended treatement yesterday.Also currrently on Diflucan for genetial yeast infection.   Patient tried to void all day, hasn't gone since 0700.  Patient feels that he has to go, but is unable to.   Patient started on Flomax during most recent hospitalization.       Past Medical History:  Diagnosis Date  . CHF (congestive heart failure) (HCC) 1999  . CKD (chronic kidney disease), stage II   . Coronary artery disease   . Hx of colonic polyps   . Hyperlipidemia   . Hyperplastic colon polyp 04/15/2007  . Hypertension   . Prostatic hypertrophy, benign    with elevated PSA  . TIA (transient ischemic attack) 2003   "mini stroke" (03/19/2013)    Patient Active Problem List   Diagnosis Date Noted  . Foul smelling urine   . Leukocytosis   . Hyponatremia   . 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 Benign essential HTN   . Coronary artery disease involving native coronary artery of native heart without angina pectoris   . Stage 3 chronic kidney disease (HCC)   . Acute lower UTI   . Seasonal allergies   . Stroke (HCC) 11/04/2017  . HLD (hyperlipidemia) 11/04/2017  . Chronic diastolic CHF (congestive heart failure) (HCC) 11/04/2017  . Acute renal failure superimposed on stage 2 chronic kidney disease (HCC) 11/04/2017  . Acute metabolic encephalopathy 11/04/2017  . UTI (urinary tract infection) 11/04/2017  . AKI (acute  kidney injury) (HCC)   . History of CVA with residual deficit   . Prediabetes   . Left hemiparesis (HCC) 03/19/2013  . Cerebral infarction (HCC) 03/19/2013  . Left leg weakness 03/19/2013  . CAD, ARTERY BYPASS GRAFT 09/30/2008  . 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 Borderline hyperglycemia 12/03/2007  . Other and unspecified hyperlipidemia 07/28/2007  . Essential hypertension 07/28/2007  . 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 Coronary atherosclerosis 07/28/2007  . BENIGN PROSTATIC HYPERTROPHY 07/28/2007  . COLONIC POLYPS, 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    Past Surgical History:  Procedure Laterality Date  . CARDIAC CATHETERIZATION    . CATARACT EXTRACTION W/ INTRAOCULAR LENS  IMPLANT, BILATERAL Bilateral 1998  . COLONOSCOPY  2008  . CORONARY ARTERY BYPASS GRAFT  1999   x4  . LOOP RECORDER INSERTION N/A 11/05/2017   Procedure: LOOP RECORDER INSERTION;  Surgeon: Hillis Range, MD;  Location: [facility] INVASIVE CV LAB;  Service: Cardiovascular;  Laterality: N/A;  . TEE WITHOUT CARDIOVERSION N/A 11/05/2017   Procedure: TRANSESOPHAGEAL ECHOCARDIOGRAM (TEE) WITH LOOP;  Surgeon: Lars Masson, MD;  Location: [facility] ENDOSCOPY;  Service: Cardiovascular;  Laterality: N/A;        Home Medications    Prior to Admission medications   Medication Sig Start Date End Date Taking? Authorizing Provider  acetaminophen (TYLENOL) 325 MG tablet Take 1-2 tablets (325-650 mg total) by mouth every 4 (four)  hours as needed for mild pain. 11/19/17   Love, Evlyn Kanner, PA-C  aspirin EC 325 MG EC tablet Take 1 tablet (325 mg total) by mouth daily. 11/06/17   Joseph Art, DO  atorvastatin (LIPITOR) 80 MG tablet Take 1 tablet (80 mg total) by mouth daily. 11/06/17   Joseph Art, DO  clopidogrel (PLAVIX) 75 MG tablet TAKE 1 TABLET BY MOUTH  DAILY WITH BREAKFAST Patient taking differently: TAKE 1 TABLET (75mg ) BY MOUTH  DAILY WITH BREAKFAST 06/10/17   Wendall Stade, MD  Flax OIL Take 1 capsule by mouth daily.      [provider]  fluconazole (DIFLUCAN) 100 MG tablet Take 1 tablet (100 mg total) by mouth 2 (two) times daily. 11/25/17   Nelwyn Salisbury, MD  fluticasone Aleda Grana) 50 MCG/ACT nasal spray Place 2 sprays into both nostrils daily as needed for allergies or rhinitis. 11/19/17   Love, Evlyn Kanner, PA-C  folic acid (FOLVITE) 1 MG tablet TAKE 1 TABLET EVERY DAY Patient taking differently: TAKE 1 TABLET (1mg ) by mouth EVERY DAY 01/28/17   Wendall Stade, MD  hydrocerin (EUCERIN) CREA Apply 1 application topically 2 (two) times daily. 11/19/17   Love, Evlyn Kanner, PA-C  ketoconazole (NIZORAL) 2 % cream Apply 1 application topically 2 (two) times daily. 11/25/17   Nelwyn Salisbury, MD  loratadine (CLARITIN) 10 MG tablet Take 10 mg by mouth daily.    [provider]  metoprolol tartrate (LOPRESSOR) 50 MG tablet TAKE 1 TABLET BY MOUTH TWO  TIMES DAILY Patient taking differently: TAKE 1 TABLET (50mg ) BY MOUTH TWO  TIMES DAILY 01/28/17   Wendall Stade, MD  Omega-3 Fatty Acids (FISH OIL) 1000 MG CAPS Take 1 capsule by mouth daily.      [provider]  sodium chloride (OCEAN) 0.65 % SOLN nasal spray Place 1 spray into both nostrils 2 (two) times daily before lunch and supper. 11/19/17   Love, Evlyn Kanner, PA-C  tamsulosin (FLOMAX) 0.4 MG CAPS capsule Take 1 capsule (0.4 mg total) by mouth daily after supper. 11/21/17   LoveEvlyn Kanner, PA-C    Family History Family History  Problem Relation Age of Onset  . Coronary artery disease Mother   . Stroke Father     Social History Social History   Tobacco Use  . Smoking status: Former Smoker    Packs/day: 0.75    Years: 50.00    Pack years: 37.50    Types: Cigarettes    Last attempt to quit: 03/19/2013    Years since quitting: 4.7  . Smokeless tobacco: Never Used  Substance Use Topics  . Alcohol use: Yes    Alcohol/week: 0.6 oz    Types: 1 Cans of beer per week    Comment: 03/19/2013 "1 beer/week" does not drink anymore  . Drug use: No     Allergies     Sulfonamide derivatives   Review of Systems Review of Systems  Genitourinary: Positive for difficulty urinating.  All other systems reviewed and are negative.    Physical Exam Updated Vital Signs BP (!) 142/111 (BP Location: Right Arm)   Pulse 99   Temp 97.9 F (36.6 C) (Oral)   Resp 18   Ht 5\' 8"  (1.727 m)   Wt 69.9 kg (154 lb)   SpO2 98%   BMI 23.42 kg/m   Physical Exam  Constitutional: He is oriented to person, place, and time. He appears well-nourished.  HENT:  Head: Normocephalic.  Eyes: Conjunctivae are normal.  Cardiovascular: Normal rate.  Pulmonary/Chest: Effort normal and breath sounds normal.  Abdominal: Soft. Bowel sounds are normal.  Mild tenderness suprapubic  Neurological: He is oriented to person, place, and time.  Skin: Skin is warm and dry. He is not diaphoretic.  Diffuse rash, healing diffuse hives.  Diffuse fungal appearing rash on genitals.  Psychiatric: He has a normal mood and affect. His behavior is normal.     ED Treatments / Results  Labs (all labs ordered are listed, but only abnormal results are displayed) Labs Reviewed  URINALYSIS, ROUTINE W REFLEX MICROSCOPIC    EKG None  Radiology No results found.  Procedures Procedures (including critical care time)  Medications Ordered in ED Medications - No data to display   Initial Impression / Assessment and Plan / ED Course  I have reviewed the triage vital signs and the nursing notes.  Pertinent labs & imaging results that were available during my care of the patient were reviewed by me and considered in my medical decision making (see chart for details).    Pt is a 77 yo male with ho CHF, hospitalized last month for stroke and spent 2 weeks in rehab.  Discharge from hospital.  Patient had hives while he was in the hospital and those have been resolving.  Patient had urinary symptoms as an outpatient pt recently treated nitrofuritan, ended treatement yesterday.Also currrently on  Diflucan for genetial yeast infection.   Patient tried to void all day, hasn't gone since 0700.  Patient feels that he has to go, but is unable to.   Patient started on Flomax during most recent hospitalization.      6:28 PM In reviewing patient's cultures, patient grew out Citrobacter brakii  which was sensitive to nitrofurantoin.  We ultrasounded patient and found to his bladder scan to be less than 200.  I am concerned that this may mean the patient is severely  dehydrated rather than having an actual physical urethral blockage.  However he has had a history of needing to cath since hospitalization.   Potential includes dehydration causing lack of urine and therefore causing lack of ability to urinate.  Versus physical urethral blockage from yeast or blood clot from new urinary tract infection.  Will get labs to rule out kidney injury, look for white count.  Send urine.  Place Foley.  Urine shows infection. Given he has already had 5 days of PO abx that matched urine culture sensistivity, I think patient needs admission for failed outpatietn treatment of UTI.    Would consider systemic steroids for chronic uticaria, will defer to primary team. No mucosal involvement of rash.    Final Clinical Impressions(s) / ED Diagnoses   Final diagnoses:  None    ED Discharge Orders    None       Abelino Derrick, MD 12/04/17 [phone]

## 2017-12-01 NOTE — ED Notes (Signed)
Bladder scan shows 157 mL  Baird Lyons RN

## 2017-12-01 NOTE — ED Notes (Addendum)
Pt provided urinal at their request. Pt still unable to void.

## 2017-12-01 NOTE — Progress Notes (Signed)
Pharmacy Antibiotic Note  Thomas [name]. is a 77 y.o. male admitted on 12/01/2017 with UTI. Recent urine cx grew Citrobacter braakii.  Pharmacy has been consulted for Merrem dosing.  Plan: Merrem 1g IV Q12H.  Height: 5\' 8"  (172.7 cm) Weight: 154 lb (69.9 kg) IBW/kg (Calculated) : 68.4  Temp (24hrs), Avg:98 F (36.7 C), Min:97.9 F (36.6 C), Max:98 F (36.7 C)  Recent Labs  Lab 12/01/17 2126 12/01/17 2133  WBC 13.1*  --   CREATININE 1.32*  --   LATICACIDVEN  --  1.23    Estimated Creatinine Clearance: 46.1 mL/min (A) (by C-G formula based on SCr of 1.32 mg/dL (H)).    Allergies  Allergen Reactions  . Sulfonamide Derivatives     REACTION: Weak,lethargic      Thank you for allowing pharmacy to be a part of this patient's care.  Vernard Gambles, PharmD, BCPS  12/01/2017 11:23 PM

## 2017-12-02 DIAGNOSIS — N183 Chronic kidney disease, stage 3 (moderate): Secondary | ICD-10-CM

## 2017-12-02 DIAGNOSIS — I6930 Unspecified sequelae of cerebral infarction: Secondary | ICD-10-CM | POA: Diagnosis not present

## 2017-12-02 DIAGNOSIS — N390 Urinary tract infection, site not specified: Secondary | ICD-10-CM | POA: Diagnosis not present

## 2017-12-02 DIAGNOSIS — I5032 Chronic diastolic (congestive) heart failure: Secondary | ICD-10-CM | POA: Diagnosis not present

## 2017-12-02 LAB — CBC
HCT: 36.3 % — ABNORMAL LOW (ref 39.0–52.0)
HEMATOCRIT: 37.9 % — AB (ref 39.0–52.0)
HEMOGLOBIN: 12.6 g/dL — AB (ref 13.0–17.0)
HEMOGLOBIN: 12.5 g/dL — AB (ref 13.0–17.0)
MCH: 30.9 pg (ref 26.0–34.0)
MCH: 32.1 pg (ref 26.0–34.0)
MCHC: 33.2 g/dL (ref 30.0–36.0)
MCHC: 34.4 g/dL (ref 30.0–36.0)
MCV: 92.9 fL (ref 78.0–100.0)
MCV: 93.1 fL (ref 78.0–100.0)
PLATELETS: 309 10*3/uL (ref 150–400)
Platelets: 293 10*3/uL (ref 150–400)
RBC: 4.08 MIL/uL — AB (ref 4.22–5.81)
RBC: 3.9 MIL/uL — ABNORMAL LOW (ref 4.22–5.81)
RDW: 13.5 % (ref 11.5–15.5)
RDW: 13.7 % (ref 11.5–15.5)
WBC: 12.1 10*3/uL — ABNORMAL HIGH (ref 4.0–10.5)
WBC: 13.3 10*3/uL — ABNORMAL HIGH (ref 4.0–10.5)

## 2017-12-02 LAB — COMPREHENSIVE METABOLIC PANEL
ALBUMIN: 2.7 g/dL — AB (ref 3.5–5.0)
ALK PHOS: 80 U/L (ref 38–126)
ALT: 16 U/L — AB (ref 17–63)
AST: 31 U/L (ref 15–41)
Anion gap: 11 (ref 5–15)
BUN: 17 mg/dL (ref 6–20)
CALCIUM: 7.9 mg/dL — AB (ref 8.9–10.3)
CHLORIDE: 107 mmol/L (ref 101–111)
CO2: 18 mmol/L — AB (ref 22–32)
CREATININE: 1.17 mg/dL (ref 0.61–1.24)
GFR calc Af Amer: >60 mL/min (ref >60)
GFR calc non Af Amer: 59 mL/min — ABNORMAL LOW (ref >60)
GLUCOSE: 98 mg/dL (ref 65–99)
Potassium: 4.7 mmol/L (ref 3.5–5.1)
SODIUM: 136 mmol/L (ref 135–145)
Total Bilirubin: 0.8 mg/dL (ref 0.3–1.2)
Total Protein: 5 g/dL — ABNORMAL LOW (ref 6.5–8.1)

## 2017-12-02 LAB — CREATININE, SERUM
Creatinine, Ser: 1.27 mg/dL — ABNORMAL HIGH (ref 0.61–1.24)
GFR calc non Af Amer: 53 mL/min — ABNORMAL LOW (ref >60)

## 2017-12-02 MED ORDER — BETHANECHOL CHLORIDE 10 MG PO TABS
10.0000 mg | ORAL_TABLET | Freq: Four times a day (QID) | ORAL | Status: DC
  Administered 2017-12-02 – 2017-12-03 (×4): 10 mg via ORAL
  Filled 2017-12-02 (×6): qty 1

## 2017-12-02 MED ORDER — TAMSULOSIN HCL 0.4 MG PO CAPS
0.4000 mg | ORAL_CAPSULE | Freq: Every day | ORAL | Status: DC
  Administered 2017-12-02: 0.4 mg via ORAL
  Filled 2017-12-02: qty 1

## 2017-12-02 MED ORDER — FLUTICASONE PROPIONATE 50 MCG/ACT NA SUSP
2.0000 | Freq: Every day | NASAL | Status: DC | PRN
  Administered 2017-12-03: 2 via NASAL
  Filled 2017-12-02: qty 16

## 2017-12-02 MED ORDER — HEPARIN SODIUM (PORCINE) 5000 UNIT/ML IJ SOLN
5000.0000 [IU] | Freq: Three times a day (TID) | INTRAMUSCULAR | Status: DC
  Administered 2017-12-02 – 2017-12-03 (×5): 5000 [IU] via SUBCUTANEOUS
  Filled 2017-12-02 (×5): qty 1

## 2017-12-02 MED ORDER — OMEGA-3-ACID ETHYL ESTERS 1 G PO CAPS
1.0000 | ORAL_CAPSULE | Freq: Every day | ORAL | Status: DC
  Administered 2017-12-02 – 2017-12-03 (×2): 1 g via ORAL
  Filled 2017-12-02 (×2): qty 1

## 2017-12-02 MED ORDER — METOPROLOL TARTRATE 50 MG PO TABS
50.0000 mg | ORAL_TABLET | Freq: Two times a day (BID) | ORAL | Status: DC
  Administered 2017-12-02 – 2017-12-03 (×4): 50 mg via ORAL
  Filled 2017-12-02: qty 2
  Filled 2017-12-02 (×3): qty 1

## 2017-12-02 MED ORDER — FLAX PO OIL: 1.0000 | TOPICAL_OIL | Freq: Every day | ORAL | Status: DC

## 2017-12-02 MED ORDER — HYDROCERIN EX CREA
1.0000 "application " | TOPICAL_CREAM | Freq: Two times a day (BID) | CUTANEOUS | Status: DC
  Administered 2017-12-02 – 2017-12-03 (×3): 1 via TOPICAL
  Filled 2017-12-02 (×2): qty 113

## 2017-12-02 MED ORDER — CIPROFLOXACIN HCL 500 MG PO TABS
500.0000 mg | ORAL_TABLET | Freq: Two times a day (BID) | ORAL | Status: DC
  Administered 2017-12-02 – 2017-12-03 (×2): 500 mg via ORAL
  Filled 2017-12-02 (×2): qty 1

## 2017-12-02 MED ORDER — HYDROCODONE-ACETAMINOPHEN 5-325 MG PO TABS: 1.0000 | ORAL_TABLET | ORAL | Status: DC | PRN

## 2017-12-02 MED ORDER — LORATADINE 10 MG PO TABS
10.0000 mg | ORAL_TABLET | Freq: Every day | ORAL | Status: DC
  Administered 2017-12-02 (×2): 10 mg via ORAL
  Filled 2017-12-02 (×2): qty 1

## 2017-12-02 MED ORDER — CLOPIDOGREL BISULFATE 75 MG PO TABS
75.0000 mg | ORAL_TABLET | Freq: Every day | ORAL | Status: DC
  Administered 2017-12-02 – 2017-12-03 (×2): 75 mg via ORAL
  Filled 2017-12-02 (×2): qty 1

## 2017-12-02 MED ORDER — KETOCONAZOLE 2 % EX CREA
1.0000 "application " | TOPICAL_CREAM | Freq: Two times a day (BID) | CUTANEOUS | Status: DC
  Administered 2017-12-02 – 2017-12-03 (×3): 1 via TOPICAL
  Filled 2017-12-02 (×2): qty 15

## 2017-12-02 MED ORDER — ONDANSETRON HCL 4 MG/2ML IJ SOLN: 4.0000 mg | Freq: Four times a day (QID) | INTRAMUSCULAR | Status: DC | PRN

## 2017-12-02 MED ORDER — ONDANSETRON HCL 4 MG PO TABS: 4.0000 mg | ORAL_TABLET | Freq: Four times a day (QID) | ORAL | Status: DC | PRN

## 2017-12-02 MED ORDER — ACETAMINOPHEN 325 MG PO TABS
325.0000 mg | ORAL_TABLET | ORAL | Status: DC | PRN
  Administered 2017-12-03: 650 mg via ORAL
  Filled 2017-12-02: qty 2

## 2017-12-02 MED ORDER — ATORVASTATIN CALCIUM 80 MG PO TABS
80.0000 mg | ORAL_TABLET | Freq: Every day | ORAL | Status: DC
  Administered 2017-12-02 – 2017-12-03 (×2): 80 mg via ORAL
  Filled 2017-12-02 (×2): qty 1

## 2017-12-02 MED ORDER — ASPIRIN EC 325 MG PO TBEC
325.0000 mg | DELAYED_RELEASE_TABLET | Freq: Every day | ORAL | Status: DC
  Administered 2017-12-02 – 2017-12-03 (×2): 325 mg via ORAL
  Filled 2017-12-02 (×2): qty 1

## 2017-12-02 MED ORDER — FLUCONAZOLE 100 MG PO TABS
100.0000 mg | ORAL_TABLET | Freq: Two times a day (BID) | ORAL | Status: DC
  Administered 2017-12-02 – 2017-12-03 (×4): 100 mg via ORAL
  Filled 2017-12-02 (×5): qty 1

## 2017-12-02 MED ORDER — FOLIC ACID 1 MG PO TABS
1.0000 mg | ORAL_TABLET | Freq: Every day | ORAL | Status: DC
  Administered 2017-12-02 – 2017-12-03 (×2): 1 mg via ORAL
  Filled 2017-12-02 (×2): qty 1

## 2017-12-02 MED ORDER — SODIUM CHLORIDE 0.9 % IV SOLN
INTRAVENOUS | Status: DC
  Administered 2017-12-02 – 2017-12-03 (×3): via INTRAVENOUS

## 2017-12-02 NOTE — Progress Notes (Signed)
TEAM 1 - Stepdown/ICU TEAM  Thomas [name].  YIR:[id number] DOB: 1941-08-04 DOA: 12/01/2017 PCP: Shirline Frees, NP    Brief Narrative:  77 y.o. 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 Patient had UTI while in the hospital with cultures showing Citrobacter braakii. This was resistant to ceftriaxone but sensitive to quinolones as well as imipenem and nitrofurantoin. Patient was given nitrofurantoin for 5 days which he completed. He presented to the ED as he was unable to urinate.  In the ED HR was 123, WBC 13.1, lactic acid 1.23. Urinalysis noted WBC too numerous to count with many bacteria.  Significant Events: 4/21 admit   Subjective: Patient reports that he felt an urgent need to urinate at least 20 times on the date of his admission.  He made multiple attempts to urinate but was never able to produce any urine.  It was for this reason that he presented to the ED for evaluation.  He presently has a Foley catheter in place.  He has no other complaints at this time.  He denies chest pain nausea vomiting or abdominal pain.  He reports a good appetite.  Assessment & Plan:  Bladder spasms  Bladder scan on arrival in ED noted <200cc of urine therefore this does not appear to have been actual urinary retention - I suspect the patient was experiencing bladder spasms related to his recent UTI -begin antispasmodic -continue to treat UTI -plan to discontinue Foley in a.m. 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   DVT prophylaxis: SQ heparin  Code Status: FULL CODE Family Communication: Spoke with wife at bedside Disposition Plan: Begin antispasmodic for bladder -change to oral antibiotic -voiding trial in a.m. with possible discharge home 4/23 afternoon if goes well  Consultants:  none  Antimicrobials:  Meropenem 4/21 > 4/22 Fluconazole 4/21 > Cipro 4/22 >  Objective: Blood pressure (!) 144/91, pulse (!) 101, temperature 98.2 F (36.8 C), temperature source Oral, resp. rate 16, height 5\' 8"  (1.727 m), weight 69.9 kg (154 lb), SpO2 100 %.  Intake/Output Summary (Last 24 hours) at 12/02/2017 1408 Last data filed at 12/02/2017 1300 Gross per 24 hour  Intake 2037.5 ml  Output 875 ml  Net 1162.5 ml   Filed Weights   12/01/17 1528  Weight: 69.9 kg (154 lb)    Examination: General: No acute respiratory distress Lungs: Clear to auscultation bilaterally without wheezes or crackles Cardiovascular: Regular rate and rhythm without murmur gallop or rub normal S1 and S2 Abdomen: Nontender, nondistended, soft, bowel sounds positive, no rebound, no ascites, no appreciable mass Extremities: No significant cyanosis, clubbing, or edema bilateral lower extremities  CBC: Recent Labs  Lab 12/01/17 2126 12/02/17 0201 12/02/17 0710  WBC 13.1* 12.1* 13.3*  NEUTROABS  8.9*  --   --   HGB 12.8* 12.6* 12.5*  HCT 38.1* 37.9* 36.3*  MCV 92.5 92.9 93.1  PLT 255 293 309   Basic Metabolic Panel: Recent Labs  Lab 12/01/17 2126 12/02/17 0201 12/02/17 0710  NA 137  --  136  K 4.8  --  4.7  CL 104  --  107  CO2 23  --  18*  GLUCOSE 113*  --  98  BUN 23*  --  17  CREATININE 1.32* 1.27* 1.17  CALCIUM 8.3*  --  7.9*   GFR: Estimated  Creatinine Clearance: 52 mL/min (by C-G formula based on SCr of 1.17 mg/dL).  Liver Function Tests: Recent Labs  Lab 12/01/17 2126 12/02/17 0710  AST 32 31  ALT 13* 16*  ALKPHOS 89 80  BILITOT 1.1 0.8  PROT 5.8* 5.0*  ALBUMIN 2.9* 2.7*   Scheduled Meds: . aspirin  325 mg Oral Daily  . atorvastatin  80 mg Oral Daily  . clopidogrel  75 mg Oral Q breakfast  . fluconazole  100 mg Oral BID  . folic acid  1 mg Oral Daily  . heparin  5,000 Units Subcutaneous Q8H  . hydrocerin  1 application Topical BID  . ketoconazole  1 application Topical BID  . loratadine  10 mg Oral QHS  . metoprolol tartrate  50 mg Oral BID  . omega-3 acid ethyl esters  1 capsule Oral Daily  . tamsulosin  0.4 mg Oral QPC supper     LOS: 1 day   Lonia Blood, MD [facility] Office  [phone] Pager - Text Page per Amion as per below:  On-Call/Text Page:      [email or web address]      password [email or web address]  If 7PM-7AM, please contact night-coverage [email or web address] Password [facility] 12/02/2017, 2:08 PM

## 2017-12-02 NOTE — Plan of Care (Signed)
  Problem: Health Behavior/Discharge Planning: Goal: Ability to manage health-related needs will improve Outcome: Progressing   Problem: Nutrition: Goal: Adequate nutrition will be maintained Outcome: Progressing   

## 2017-12-03 ENCOUNTER — Inpatient Hospital Stay: Payer: Medicare Other | Admitting: Adult Health

## 2017-12-03 DIAGNOSIS — N390 Urinary tract infection, site not specified: Secondary | ICD-10-CM | POA: Diagnosis not present

## 2017-12-03 LAB — CBC
HCT: 35.7 % — ABNORMAL LOW (ref 39.0–52.0)
HEMOGLOBIN: 11.5 g/dL — AB (ref 13.0–17.0)
MCH: 30.3 pg (ref 26.0–34.0)
MCHC: 32.2 g/dL (ref 30.0–36.0)
MCV: 94.2 fL (ref 78.0–100.0)
PLATELETS: 264 10*3/uL (ref 150–400)
RBC: 3.79 MIL/uL — ABNORMAL LOW (ref 4.22–5.81)
RDW: 13.4 % (ref 11.5–15.5)
WBC: 11.2 10*3/uL — ABNORMAL HIGH (ref 4.0–10.5)

## 2017-12-03 LAB — BASIC METABOLIC PANEL
Anion gap: 9 (ref 5–15)
BUN: 12 mg/dL (ref 6–20)
CALCIUM: 8 mg/dL — AB (ref 8.9–10.3)
CHLORIDE: 107 mmol/L (ref 101–111)
CO2: 21 mmol/L — ABNORMAL LOW (ref 22–32)
CREATININE: 1.07 mg/dL (ref 0.61–1.24)
GFR calc non Af Amer: >60 mL/min (ref >60)
Glucose, Bld: 107 mg/dL — ABNORMAL HIGH (ref 65–99)
Potassium: 4 mmol/L (ref 3.5–5.1)
SODIUM: 137 mmol/L (ref 135–145)

## 2017-12-03 MED ORDER — BETHANECHOL CHLORIDE 10 MG PO TABS: 10.0000 mg | ORAL_TABLET | Freq: Four times a day (QID) | ORAL | 0 refills | Status: DC

## 2017-12-03 MED ORDER — CIPROFLOXACIN HCL 500 MG PO TABS: 500.0000 mg | ORAL_TABLET | Freq: Two times a day (BID) | ORAL | 0 refills | Status: DC

## 2017-12-03 NOTE — Evaluation (Signed)
Occupational Therapy Evaluation Patient Details Name: Thomas [name]. MRN: [id number] DOB: 10/25/1940 Today's Date: 12/03/2017    History of Present Illness 77 y.o. 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 Patient had UTI while in the hospital with cultures showing Citrobacter braakii. This was resistant to ceftriaxone but sensitive to quinolones as well as imipenem and nitrofurantoin. Patient was given nitrofurantoin for 5 days which he completed. He presented to the ED as he was unable to urinate   Clinical Impression   Eval limited due to fatigue. Pt with decline in function and safety with ADLs and ADL mobility with decreased strength, balance and endurance. Pt with hx of CVA and recently d/c form inpt rehab to home. PTA pt required use of RW for mobility and assist with LB ADLs form his wife at home. Pt sat EOB x 8 minutes attempting to urinate using urinal, however was unable and requested to return to bed due to fatigue. Pt would benefit from acute OT services to address impairments to maximize level of function and safety    Follow Up Recommendations  Home health OT(depending on progress)    Equipment Recommendations  None recommended by OT    Recommendations for Other Services       Precautions / Restrictions Precautions Precautions: Fall Precaution Comments: L side weakness and hx visual impairments Restrictions Weight Bearing Restrictions: No      Mobility Bed Mobility Overal bed mobility: Needs Assistance Bed Mobility: Supine to Sit;Sit to Supine     Supine to sit: Min guard Sit to supine: Min guard      Transfers Overall transfer level: Needs assistance               General transfer comment: pt declined due to fatigue after sitting EOB x 8 minutes attempting to urinate using urinal    Balance Overall balance assessment: Needs assistance Sitting-balance support: No upper extremity  supported;Feet supported Sitting balance-Leahy Scale: Fair Sitting balance - Comments: pt with posterior lean x 2 when trying to use urinal, requird min A to correct and maintain posture                                   ADL either performed or assessed with clinical judgement   ADL Overall ADL's : Needs assistance/impaired Eating/Feeding: Supervision/ safety;Set up;Sitting   Grooming: Wash/dry hands;Wash/dry face;Sitting;Min guard   Upper Body Bathing: Sitting;Min guard Upper Body Bathing Details (indicate cue type and reason): LOB x 2 leaning posteriorly when simulaitng tasks Lower Body Bathing: Moderate assistance   Upper Body Dressing : Min guard Upper Body Dressing Details (indicate cue type and reason): LOB x 2 leaning posteriorly when simulaitng tasks Lower Body Dressing: Moderate assistance     Toilet Transfer Details (indicate cue type and reason): NT due to fatigue after sitting EOB x 8 minutes attemtping to urinate using urinal                 Vision Baseline Vision/History: Wears glasses Wears Glasses: At all times(L side) Patient Visual Report: Peripheral vision impairment       Perception     Praxis      Pertinent Vitals/Pain Pain Assessment: No/denies pain     Hand Dominance Right   Extremity/Trunk Assessment Upper Extremity Assessment Upper Extremity Assessment: Generalized weakness   Lower Extremity Assessment Lower Extremity  Assessment: Defer to PT evaluation       Communication Communication Communication: No difficulties   Cognition Arousal/Alertness: Awake/alert Behavior During Therapy: WFL for tasks assessed/performed Overall Cognitive Status: No family/caregiver present to determine baseline cognitive functioning                                     General Comments       Exercises     Shoulder Instructions      Home Living Family/patient expects to be discharged to:: Private residence Living  Arrangements: Spouse/significant other Available Help at Discharge: Available 24 hours/day;Family Type of Home: House Home Access: Stairs to enter [employer] of Steps: 3-4 Entrance Stairs-Rails: Can reach both Home Layout: Two level;Bed/bath upstairs Alternate Level Stairs-Number of Steps: 14 Alternate Level Stairs-Rails: Right Bathroom Shower/Tub: Chief Strategy Officer: Standard     Home Equipment: Environmental consultant - 4 wheels;Shower seat;Other (comment);Grab bars - toilet(has chair lift)   Additional Comments: hx of falls  Lives With: Spouse    Prior Functioning/Environment Level of Independence: Independent with assistive device(s);Needs assistance    ADL's / Homemaking Assistance Needed: , assist with LB ADLs since d/c home from rehab recently   Comments: pt uses rollator for ambulation, assist with LB ADLs since d/c home from rehab recently        OT Problem List: Decreased strength;Decreased activity tolerance;Decreased cognition;Decreased knowledge of use of DME or AE;Impaired balance (sitting and/or standing);Decreased coordination      OT Treatment/Interventions: Self-care/ADL training;DME and/or AE instruction;Therapeutic activities;Balance training;Therapeutic exercise;Neuromuscular education;Patient/family education    OT Goals(Current goals can be found in the care plan section) Acute Rehab OT Goals Patient Stated Goal: get better and go home OT Goal Formulation: With patient Time For Goal Achievement: 12/17/17 Potential to Achieve Goals: Good ADL Goals Pt Will Perform Grooming: with supervision;with set-up;sitting;with caregiver independent in assisting Pt Will Perform Upper Body Bathing: with supervision;with set-up;sitting;with caregiver independent in assisting Pt Will Perform Lower Body Bathing: with min assist;with caregiver independent in assisting Pt Will Perform Upper Body Dressing: with supervision;with set-up;sitting;with caregiver  independent in assisting Pt Will Perform Lower Body Dressing: with min assist;with caregiver independent in assisting Pt Will Transfer to Toilet: with min assist;ambulating;stand pivot transfer;bedside commode Pt Will Perform Toileting - Clothing Manipulation and hygiene: with min assist;sit to/from stand;with caregiver independent in assisting  OT Frequency: Min 2X/week   Barriers to D/C:    no barriers       Co-evaluation              AM-PAC PT "6 Clicks" Daily Activity     Outcome Measure Help from another person eating meals?: None Help from another person taking care of personal grooming?: A Little Help from another person toileting, which includes using toliet, bedpan, or urinal?: A Lot Help from another person bathing (including washing, rinsing, drying)?: A Lot Help from another person to put on and taking off regular upper body clothing?: A Little Help from another person to put on and taking off regular lower body clothing?: A Lot 6 Click Score: 16   End of Session    Activity Tolerance: Patient limited by fatigue Patient left: in bed;with call bell/phone within reach;with bed alarm set  OT Visit Diagnosis: History of falling (Z91.81);Muscle weakness (generalized) (M62.81);Other abnormalities of gait and mobility (R26.89)  Time: 2947-6546 OT Time Calculation (min): 25 min Charges:  OT General Charges $OT Visit: 1 Visit OT Evaluation $OT Eval Moderate Complexity: 1 Mod OT Treatments $Therapeutic Activity: 8-22 mins G-Codes: OT G-codes **NOT FOR INPATIENT CLASS** Functional Assessment Tool Used: AM-PAC 6 Clicks Daily Activity     Galen Manila 12/03/2017, 2:56 PM

## 2017-12-03 NOTE — Discharge Instructions (Signed)
Ciprofloxacin tablets  What is this medicine?  CIPROFLOXACIN (sip roe FLOX a sin) is a quinolone antibiotic. It is used to treat certain kinds of bacterial infections. It will not work for colds, flu, or other viral infections.  This medicine may be used for other purposes; ask your health care provider or pharmacist if you have questions.  COMMON BRAND NAME(S): Cipro  What should I tell my health care provider before I take this medicine?  They need to know if you have any of these conditions:  -bone problems  -history of low levels of potassium in the blood  -joint problems  -irregular heartbeat  -kidney disease  -myasthenia gravis  -seizures  -tendon problems  -tingling of the fingers or toes, or other nerve disorder  -an unusual or allergic reaction to ciprofloxacin, other antibiotics or medicines, foods, dyes, or preservatives  -pregnant or trying to get pregnant  -breast-feeding  How should I use this medicine?  Take this medicine by mouth with a glass of water. Follow the directions on the prescription label. Take your medicine at regular intervals. Do not take your medicine more often than directed. Take all of your medicine as directed even if you think your are better. Do not skip doses or stop your medicine early.  You can take this medicine with food or on an empty stomach. It can be taken with a meal that contains dairy or calcium, but do not take it alone with a dairy product, like milk or yogurt or calcium-fortified juice.  A special MedGuide will be given to you by the pharmacist with each prescription and refill. Be sure to read this information carefully each time.  Talk to your pediatrician regarding the use of this medicine in children. Special care may be needed.  Overdosage: If you think you have taken too much of this medicine contact a poison control center or emergency room at once.  NOTE: This medicine is only for you. Do not share this medicine with others.  What if I miss a dose?  If you  miss a dose, take it as soon as you can. If it is almost time for your next dose, take only that dose. Do not take double or extra doses.  What may interact with this medicine?  Do not take this medicine with any of the following medications:  -cisapride  -dofetilide  -dronedarone  -flibanserin  -lomitapide  -pimozide  -thioridazine  -tizanidine  -ziprasidone  This medicine may also interact with the following medications:  -antacids  -birth control pills  -caffeine  -certain medicines for diabetes, like glipizide or glyburide  -certain medicines that treat or prevent blood clots like warfarin  -clozapine  -cyclosporine  -didanosine (ddI) buffered tablets or powder  -duloxetine  -lanthanum carbonate  -lidocaine  -methotrexate  -multivitamins  -NSAIDS, medicines for pain and inflammation, like ibuprofen or naproxen  -olanzapine  -omeprazole  -other medicines that prolong the QT interval (cause an abnormal heart rhythm)  -phenytoin  -probenecid  -ropinirole  -sevelamer  -sildenafil  -sucralfate  -theophylline  -zolpidem  This list may not describe all possible interactions. Give your health care provider a list of all the medicines, herbs, non-prescription drugs, or dietary supplements you use. Also tell them if you smoke, drink alcohol, or use illegal drugs. Some items may interact with your medicine.  What should I watch for while using this medicine?  Tell your doctor or health care professional if your symptoms do not improve.  Do not treat   diarrhea with over the counter products. Contact your doctor if you have diarrhea that lasts more than 2 days or if it is severe and watery.  You may get drowsy or dizzy. Do not drive, use machinery, or do anything that needs mental alertness until you know how this medicine affects you. Do not stand or sit up quickly, especially if you are an older patient. This reduces the risk of dizzy or fainting spells.  This medicine can make you more sensitive to the sun. Keep out of the  sun. If you cannot avoid being in the sun, wear protective clothing and use sunscreen. Do not use sun lamps or tanning beds/booths.  Avoid antacids, aluminum, calcium, iron, magnesium, and zinc products for 6 hours before and 2 hours after taking a dose of this medicine.  What side effects may I notice from receiving this medicine?  Side effects that you should report to your doctor or health care professional as soon as possible:  -allergic reactions like skin rash or hives, swelling of the face, lips, or tongue  -anxious  -confusion  -depressed mood  -diarrhea  -fast, irregular heartbeat  -hallucination, loss of contact with reality  -joint, muscle, or tendon pain or swelling  -pain, tingling, numbness in the hands or feet  -suicidal thoughts or other mood changes  -sunburn  -unusually weak or tired  Side effects that usually do not require medical attention (report to your doctor or health care professional if they continue or are bothersome):  -dry mouth  -headache  -nausea  -trouble sleeping  This list may not describe all possible side effects. Call your doctor for medical advice about side effects. You may report side effects to FDA at 1-800-FDA-1088.  Where should I keep my medicine?  Keep out of the reach of children.  Store at room temperature below 30 degrees C (86 degrees F). Keep container tightly closed. Throw away any unused medicine after the expiration date.  NOTE: This sheet is a summary. It may not cover all possible information. If you have questions about this medicine, talk to your doctor, pharmacist, or health care provider.  © 2018 [employer] (2016-03-09 14:42:02)

## 2017-12-03 NOTE — Care Management CC44 (Signed)
Condition Code 44 Documentation Completed  Patient Details  Name: Thomas [name]. MRN: [id number] Date of Birth: 1941-02-26   Condition Code 44 given:  Yes Patient signature on Condition Code 44 notice:  Yes Documentation of 2 MD's agreement:  Yes Code 44 added to claim:  Yes    Durenda Guthrie, RN 12/03/2017, 2:25 PM

## 2017-12-03 NOTE — Care Management Obs Status (Signed)
MEDICARE OBSERVATION STATUS NOTIFICATION   Patient Details  Name: Thomas [name]. MRN: [id number] Date of Birth: 10-Feb-1941   Medicare Observation Status Notification Given:       Durenda Guthrie, RN 12/03/2017, 2:24 PM

## 2017-12-03 NOTE — Plan of Care (Signed)
  Problem: Education: Goal: Knowledge of General Education information will improve Outcome: Completed/Met   Problem: Health Behavior/Discharge Planning: Goal: Ability to manage health-related needs will improve Outcome: Completed/Met   Problem: Clinical Measurements: Goal: Ability to maintain clinical measurements within normal limits will improve Outcome: Completed/Met Goal: Will remain free from infection Outcome: Completed/Met Goal: Diagnostic test results will improve Outcome: Completed/Met Goal: Respiratory complications will improve Outcome: Completed/Met Goal: Cardiovascular complication will be avoided Outcome: Completed/Met   Problem: Activity: Goal: Risk for activity intolerance will decrease Outcome: Completed/Met   Problem: Nutrition: Goal: Adequate nutrition will be maintained Outcome: Completed/Met   Problem: Coping: Goal: Level of anxiety will decrease Outcome: Completed/Met   Problem: Elimination: Goal: Will not experience complications related to bowel motility Outcome: Completed/Met Goal: Will not experience complications related to urinary retention Outcome: Completed/Met   Problem: Pain Managment: Goal: General experience of comfort will improve Outcome: Completed/Met   Problem: Skin Integrity: Goal: Risk for impaired skin integrity will decrease Outcome: Completed/Met

## 2017-12-03 NOTE — Care Management Obs Status (Signed)
MEDICARE OBSERVATION STATUS NOTIFICATION   Patient Details  Name: Thomas [name]. MRN: [id number] Date of Birth: March 03, 1941   Medicare Observation Status Notification Given:  Yes    Durenda Guthrie, RN 12/03/2017, 2:25 PM

## 2017-12-03 NOTE — Progress Notes (Signed)
Discharge instructions, RX's and follow up appts explained and provided to patient amd wife verbalized understanding.   Lindley Stachnik, Kae Heller, RN

## 2017-12-03 NOTE — Discharge Summary (Signed)
Physician Discharge Summary  [name]. ZOX:[id number] DOB: 1941/05/19 DOA: 12/01/2017  PCP: Shirline Frees, NP  Admit date: 12/01/2017 Discharge date: 12/03/2017  Recommendations for Outpatient Follow-up:  1. Check CBC and BMP in outpatient bassi 2. Continue cipro for 4 days on discharge 3. F/U with PCP in 1-2 weeks after discharge   Discharge Diagnoses:  Principal Problem:   UTI (urinary tract infection) Active Problems:   Essential hypertension   Chronic diastolic CHF (congestive heart failure) (HCC)   History of CVA with residual deficit   Stage 3 chronic kidney disease (HCC)   Leukocytosis    Discharge Condition: stable   Diet recommendation: as tolerated   History of present illness:  77 y.o.malew/ a hx ofCVA March 2019 who was in rehabilitation for 2 weeks and discharged home last week. Patient had UTI while in the hospital. This was resistant to ceftriaxone but sensitive to quinolones as well as imipenem and nitrofurantoin. Patient was given nitrofurantoin for 5 days which he completed. He presented to the ED as he was unable to urinate.  In the ED HR was 123, WBC 13.1, lactic acid 1.23. Urinalysis noted WBC too numerous to count with many bacteria.  Hospital Course:   Principal Problem:   UTI (urinary tract infection) due to pseudomonas aeruginosa and citrobacter freundii / Leukocytosis - Continue cipro for 4 days on discharge  Active Problems:   Essential hypertension - Continue home meds    Chronic diastolic CHF (congestive heart failure) (HCC) - Compensated    History of CVA with residual deficit - Stable  - Continue asa, plavix and statin therapy    Stage 3 chronic kidney disease (HCC) - Stable    Signed:  Manson Passey, MD  [facility] 12/03/2017, 12:21 PM  Pager #: [phone]  Time spent in minutes: 35 minutes    Discharge Exam: Vitals:   12/03/17 0807 12/03/17 1156  BP: 139/68 118/60  Pulse: 97 76  Resp: 17    Temp: 97.9 F (36.6 C) 97.8 F (36.6 C)  SpO2: 99% 100%   Vitals:   12/02/17 2015 12/03/17 0647 12/03/17 0807 12/03/17 1156  BP: (!) 113/58 131/69 139/68 118/60  Pulse: 76 (!) 106 97 76  Resp: 18 18 17    Temp: 97.7 F (36.5 C) 98.3 F (36.8 C) 97.9 F (36.6 C) 97.8 F (36.6 C)  TempSrc: Oral Oral Oral Oral  SpO2: 98% 98% 99% 100%  Weight:      Height:        General: Pt is alert, follows commands appropriately, not in acute distress Cardiovascular: Regular rate and rhythm, S1/S2 (+) Respiratory: Clear to auscultation bilaterally, no wheezing, no crackles, no rhonchi Abdominal: Soft, non tender, non distended, bowel sounds +, no guarding Extremities: no cyanosis, pulses palpable bilaterally DP and PT Neuro: Grossly nonfocal  Discharge Instructions  Discharge Instructions    Call MD for:  difficulty breathing, headache or visual disturbances   Complete by:  As directed    Call MD for:  redness, tenderness, or signs of infection (pain, swelling, redness, odor or green/yellow discharge around incision site)   Complete by:  As directed    Call MD for:  severe uncontrolled pain   Complete by:  As directed    Diet - low sodium heart healthy   Complete by:  As directed    Discharge instructions   Complete by:  As directed    Continue cipro for 4 days on discharge Follow up with PCP in 1-2  weeks after hospitalization   Increase activity slowly   Complete by:  As directed      Allergies as of 12/03/2017      Reactions   Sulfonamide Derivatives    REACTION: Weak,lethargic      Medication List    STOP taking these medications   nitrofurantoin (macrocrystal-monohydrate) 100 MG capsule Commonly known as:  MACROBID   sodium chloride 0.65 % Soln nasal spray Commonly known as:  OCEAN     TAKE these medications   acetaminophen 325 MG tablet Commonly known as:  TYLENOL Take 1-2 tablets (325-650 mg total) by mouth every 4 (four) hours as needed for mild pain.    aspirin 325 MG EC tablet Take 1 tablet (325 mg total) by mouth daily.   atorvastatin 80 MG tablet Commonly known as:  LIPITOR Take 1 tablet (80 mg total) by mouth daily. What changed:  when to take this   bethanechol 10 MG tablet Commonly known as:  URECHOLINE Take 1 tablet (10 mg total) by mouth 4 (four) times daily.   ciprofloxacin 500 MG tablet Commonly known as:  CIPRO Take 1 tablet (500 mg total) by mouth 2 (two) times daily.   clopidogrel 75 MG tablet Commonly known as:  PLAVIX TAKE 1 TABLET BY MOUTH  DAILY WITH BREAKFAST What changed:    how much to take  how to take this  when to take this   Fish Oil 1000 MG Caps Take 1 capsule by mouth daily.   Flax Oil Take 1 capsule by mouth daily.   fluconazole 100 MG tablet Commonly known as:  DIFLUCAN Take 1 tablet (100 mg total) by mouth 2 (two) times daily.   fluticasone 50 MCG/ACT nasal spray Commonly known as:  FLONASE Place 2 sprays into both nostrils daily as needed for allergies or rhinitis.   folic acid 1 MG tablet Commonly known as:  FOLVITE TAKE 1 TABLET EVERY DAY What changed:    how much to take  how to take this  when to take this   hydrocerin Crea Apply 1 application topically 2 (two) times daily.   ketoconazole 2 % cream Commonly known as:  NIZORAL Apply 1 application topically 2 (two) times daily.   loratadine 10 MG tablet Commonly known as:  CLARITIN Take 10 mg by mouth at bedtime.   metoprolol tartrate 50 MG tablet Commonly known as:  LOPRESSOR TAKE 1 TABLET BY MOUTH TWO  TIMES DAILY What changed:    how much to take  how to take this  when to take this   tamsulosin 0.4 MG Caps capsule Commonly known as:  FLOMAX Take 1 capsule (0.4 mg total) by mouth daily after supper.      Follow-up Information    Shirline Frees, NP. Schedule an appointment as soon as possible for a visit in 1 week(s).   Specialty:  Family Medicine Contact information: [address]  [place] [address] [phone]            The results of significant diagnostics from this hospitalization (including imaging, microbiology, ancillary and laboratory) are listed below for reference.    Significant Diagnostic Studies:  No results found in past 24 hours.     Microbiology: Recent Results (from the past 240 hour(s))  Urine culture     Status: Abnormal (Preliminary result)   Collection Time: 12/01/17  7:04 PM  Result Value Ref Range Status   Specimen Description URINE, CATHETERIZED  Final   Special Requests NONE  Final   Culture (A)  Final    >=100,000 COLONIES/mL CITROBACTER FREUNDII >=100,000 COLONIES/mL PSEUDOMONAS AERUGINOSA SUSCEPTIBILITIES TO FOLLOW Performed at [facility], [address], [place], [place] [address]    Report Status PENDING  Incomplete     Labs: Basic Metabolic Panel: Recent Labs  Lab 12/01/17 2126 12/02/17 0201 12/02/17 0710 12/03/17 0626  NA 137  --  136 137  K 4.8  --  4.7 4.0  CL 104  --  107 107  CO2 23  --  18* 21*  GLUCOSE 113*  --  98 107*  BUN 23*  --  17 12  CREATININE 1.32* 1.27* 1.17 1.07  CALCIUM 8.3*  --  7.9* 8.0*   Liver Function Tests: Recent Labs  Lab 12/01/17 2126 12/02/17 0710  AST 32 31  ALT 13* 16*  ALKPHOS 89 80  BILITOT 1.1 0.8  PROT 5.8* 5.0*  ALBUMIN 2.9* 2.7*   No results for input(s): LIPASE, AMYLASE in the last 168 hours. No results for input(s): AMMONIA in the last 168 hours. CBC: Recent Labs  Lab 12/01/17 2126 12/02/17 0201 12/02/17 0710 12/03/17 0626  WBC 13.1* 12.1* 13.3* 11.2*  NEUTROABS 8.9*  --   --   --   HGB 12.8* 12.6* 12.5* 11.5*  HCT 38.1* 37.9* 36.3* 35.7*  MCV 92.5 92.9 93.1 94.2  PLT 255 293 309 264   Cardiac Enzymes: No results for input(s): CKTOTAL, CKMB, CKMBINDEX, TROPONINI in the last 168 hours. BNP: BNP (last 3 results) Recent Labs    11/04/17 0539  BNP 202.7*    ProBNP (last 3 results) No results for input(s): PROBNP in the last  8760 hours.  CBG: No results for input(s): GLUCAP in the last 168 hours.

## 2017-12-04 ENCOUNTER — Telehealth: Payer: Self-pay | Admitting: Family Medicine

## 2017-12-04 ENCOUNTER — Encounter: Payer: Medicare Other | Attending: Registered Nurse | Admitting: Registered Nurse

## 2017-12-04 ENCOUNTER — Encounter: Payer: Self-pay | Admitting: Registered Nurse

## 2017-12-04 VITALS — BP 97/59 | HR 71 | Resp 14

## 2017-12-04 DIAGNOSIS — I509 Heart failure, unspecified: Secondary | ICD-10-CM | POA: Insufficient documentation

## 2017-12-04 DIAGNOSIS — Z87891 Personal history of nicotine dependence: Secondary | ICD-10-CM | POA: Insufficient documentation

## 2017-12-04 DIAGNOSIS — I63531 Cerebral infarction due to unspecified occlusion or stenosis of right posterior cerebral artery: Secondary | ICD-10-CM

## 2017-12-04 DIAGNOSIS — I130 Hypertensive heart and chronic kidney disease with heart failure and stage 1 through stage 4 chronic kidney disease, or unspecified chronic kidney disease: Secondary | ICD-10-CM | POA: Diagnosis not present

## 2017-12-04 DIAGNOSIS — E785 Hyperlipidemia, unspecified: Secondary | ICD-10-CM | POA: Insufficient documentation

## 2017-12-04 DIAGNOSIS — N182 Chronic kidney disease, stage 2 (mild): Secondary | ICD-10-CM | POA: Insufficient documentation

## 2017-12-04 DIAGNOSIS — I2510 Atherosclerotic heart disease of native coronary artery without angina pectoris: Secondary | ICD-10-CM | POA: Insufficient documentation

## 2017-12-04 DIAGNOSIS — I10 Essential (primary) hypertension: Secondary | ICD-10-CM

## 2017-12-04 DIAGNOSIS — Z951 Presence of aortocoronary bypass graft: Secondary | ICD-10-CM | POA: Diagnosis not present

## 2017-12-04 DIAGNOSIS — N400 Enlarged prostate without lower urinary tract symptoms: Secondary | ICD-10-CM | POA: Insufficient documentation

## 2017-12-04 LAB — URINE CULTURE

## 2017-12-04 NOTE — Progress Notes (Signed)
Subjective:    Patient ID: Thomas Alba., male    DOB: 23-Dec-1940, 77 y.o.   MRN: [id number]  HPI: Thomas [name] is a 77 year old male who is here for transitional care visit in follow up of his Acute Right PCA Stroke and Hypertension. He has been home receiving Physical, Occupational and [facility]. He denies pain and report he has a good appetite. He arrived in wheelchair, he states he walks with walker in the home.   Bilateral hands with skin peeling he's using Eucerin Cream and Ketoconazole cream as prescribed.   Thomas [name] was admitted to [facility] on 12/01/2017 and Discharge 12/03/2017. Admitted for UTI, note was reviewed. He was discharged on Cipro.  Wife in room all questions answered.   Pain Inventory Average Pain 0 Pain Right Now 0 My pain is no pain  In the last 24 hours, has pain interfered with the following? General activity 0 Relation with others 0 Enjoyment of life 0 What TIME of day is your pain at its worst? no pain Sleep (in general) Fair  Pain is worse with: no pain Pain improves with: no pain Relief from Meds: 0  Mobility walk with assistance use a walker how many minutes can you walk? 5-10 ability to climb steps?  yes do you drive?  no use a wheelchair needs help with transfers  Function retired I need assistance with the following:  dressing, bathing, toileting, meal prep, household duties and shopping  Neuro/Psych trouble walking  Prior Studies CT/MRI transitional care  Physicians involved in your care transitional care   Family History  Problem Relation Age of Onset  . Coronary artery disease Mother   . Stroke Father    Social History   Socioeconomic History  . Marital status: Married    Spouse name: cathy  . Number of children: 2  . Years of education: college  . Highest education level: Not on file  Occupational History  . Occupation: retired  Engineer, production  . Financial resource  strain: Not on file  . Food insecurity:    Worry: Not on file    Inability: Not on file  . Transportation needs:    Medical: Not on file    Non-medical: Not on file  Tobacco Use  . Smoking status: Former Smoker    Packs/day: 0.75    Years: 50.00    Pack years: 37.50    Types: Cigarettes    Last attempt to quit: 03/19/2013    Years since quitting: 4.7  . Smokeless tobacco: Never Used  Substance and Sexual Activity  . Alcohol use: Yes    Alcohol/week: 0.6 oz    Types: 1 Cans of beer per week    Comment: 03/19/2013 "1 beer/week" does not drink anymore  . Drug use: No  . Sexual activity: Not Currently  Lifestyle  . Physical activity:    Days per week: Not on file    Minutes per session: Not on file  . Stress: Not on file  Relationships  . Social connections:    Talks on phone: Not on file    Gets together: Not on file    Attends religious service: Not on file    Active member of club or organization: Not on file    Attends meetings of clubs or organizations: Not on file    Relationship status: Not on file  Other Topics Concern  . Not on file  Social History  Narrative   Patient lives at home with wife [name])   Retired.   Education one year of college.   Right handed.   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    Past Surgical History:  Procedure Laterality Date  . CARDIAC CATHETERIZATION    . CATARACT EXTRACTION W/ INTRAOCULAR LENS  IMPLANT, BILATERAL Bilateral 1998  . COLONOSCOPY  2008  . CORONARY ARTERY BYPASS GRAFT  1999   x4  . LOOP RECORDER INSERTION N/A 11/05/2017   Procedure: LOOP RECORDER INSERTION;  Surgeon: Hillis Range, MD;  Location: [facility] INVASIVE CV LAB;  Service: Cardiovascular;  Laterality: N/A;  . TEE WITHOUT CARDIOVERSION N/A 11/05/2017   Procedure: TRANSESOPHAGEAL ECHOCARDIOGRAM (TEE) WITH LOOP;  Surgeon: Lars Masson, MD;  Location: [facility] ENDOSCOPY;  Service: Cardiovascular;  Laterality: N/A;   Past Medical History:  Diagnosis Date  . CHF (congestive  heart failure) (HCC) 1999  . CKD (chronic kidney disease), stage II   . Coronary artery disease   . Hx of colonic polyps   . Hyperlipidemia   . Hyperplastic colon polyp 04/15/2007  . Hypertension   . Prostatic hypertrophy, benign    with elevated PSA  . TIA (transient ischemic attack) 2003   "mini stroke" (03/19/2013)   BP (!) 97/59 (BP Location: Left Arm, Patient Position: Sitting, Cuff Size: Normal)   Pulse 71   Resp 14   SpO2 93%   Opioid Risk Score:   Fall Risk Score:  `1  Depression screen PHQ 2/9  Depression screen [facility] 2/9 12/04/2017 11/15/2016 11/15/2016 07/13/2014 06/25/2013  Decreased Interest 1 0 0 0 0  Down, Depressed, Hopeless 0 0 0 0 0  PHQ - 2 Score 1 0 0 0 0  Altered sleeping 0 - - - -  Tired, decreased energy 1 - - - -  Change in appetite 1 - - - -  Feeling bad or failure about yourself  1 - - - -  Trouble concentrating 2 - - - -  Moving slowly or fidgety/restless 1 - - - -  Suicidal thoughts 1 - - - -  PHQ-9 Score 8 - - - -    Review of Systems  Constitutional: Negative.   HENT: Negative.   Eyes: Negative.   Respiratory: Negative.   Cardiovascular: Negative.   Gastrointestinal: Negative.   Endocrine: Negative.   Genitourinary: Positive for difficulty urinating.  Musculoskeletal: Positive for gait problem.  Skin: Negative.   Allergic/Immunologic: Negative.   Hematological: Negative.   Psychiatric/Behavioral: Negative.   All other systems reviewed and are negative.      Objective:   Physical Exam  Constitutional: He is oriented to person, place, and time. He appears well-developed and well-nourished.  HENT:  Head: Normocephalic and atraumatic.  Neck: Normal range of motion. Neck supple.  Cardiovascular: Normal rate and regular rhythm.  Musculoskeletal:  Normal Muscle Bulk and Muscle Testing Reveals: Upper Extremities: Full ROM and Muscle Strength on Right 5/5 and Left 4/5 Lower Extremities: Full ROM and Muscle Strength 5/5 Arrived in wheelchair    Neurological: He is alert and oriented to person, place, and time.  Skin: Skin is warm and dry.  Skin Peeling Bilateral Hands  Nursing note and vitals reviewed.         Assessment & Plan:  1. Acute PCA Stroke: Continue Home Therapies: Physical, Speech and Occupational 2. Hypertension: Continue current medication regimen: PCP Following.   30 minutes of face to face patient care time was spent during this visit. All questions were encouraged  and answered.  F/U with Dr Riley Kill in 1 month.

## 2017-12-04 NOTE — Telephone Encounter (Signed)
Transition Care Management Follow-up Telephone Call Thomas [name]. UYE:[id number] DOB: 03/18/1941 DOA: 12/01/2017  PCP: Shirline Frees, NP  Admit date: 12/01/2017 Discharge date: 12/03/2017  Recommendations for Outpatient Follow-up:  1. Check CBC and BMP in outpatient bassi 2. Continue cipro for 4 days on discharge 3. F/U with PCP in 1-2 weeks after discharge   Discharge Diagnoses:  Principal Problem:   UTI (urinary tract infection) Active Problems:   Essential hypertension   Chronic diastolic CHF (congestive heart failure) (HCC)   History of CVA with residual deficit   Stage 3 chronic kidney disease (HCC)   Leukocytosis  Discharge Condition: stable   Diet recommendation: as tolerated   History of present illness:  77 y.o.malew/ a hxofCVAMarch 2019who was in rehabilitation for 2 weeks anddischargedhomelast week. Patient had UTI while in the hospital. This was resistant to ceftriaxone but sensitive to quinolones as well as imipenem and nitrofurantoin. Patientwasgiven nitrofurantoin for 5 days whichhecompleted. He presented to the ED ashe was unable to urinate.    How have you been since you were released from the hospital? "wife Thomas [name] says better"   Do you understand why you were in the hospital? yes   Do you understand the discharge instructions? yes   Where were you discharged to? home   Items Reviewed:  Medications reviewed: yes  Allergies reviewed: yes  Dietary changes reviewed: yes  Referrals reviewed: yes   Functional Questionnaire:   Activities of Daily Living (ADLs):   He states they are independent in the following: ambulation, bathing and hygiene, feeding, continence, grooming, toileting and dressing States they require assistance with the following: none   Any transportation issues/concerns?: no   Any patient concerns? no   Confirmed importance and date/time of follow-up visits scheduled yes  Provider Appointment  booked with Shirline Frees NP on 12/12/2017 Thursday 11:30 am  Confirmed with patient if condition begins to worsen call PCP or go to the ER.  Patient was given the office number and encouraged to call back with question or concerns.  : yes

## 2017-12-04 NOTE — Telephone Encounter (Signed)
I left a voice message for pt to return my call.  

## 2017-12-05 ENCOUNTER — Other Ambulatory Visit: Payer: Self-pay

## 2017-12-05 ENCOUNTER — Telehealth: Payer: Self-pay | Admitting: Rehabilitative and Restorative Service Providers"

## 2017-12-05 ENCOUNTER — Encounter: Payer: Self-pay | Admitting: Speech Pathology

## 2017-12-05 ENCOUNTER — Ambulatory Visit: Payer: Medicare Other | Admitting: Speech Pathology

## 2017-12-05 ENCOUNTER — Encounter: Payer: Self-pay | Admitting: *Deleted

## 2017-12-05 ENCOUNTER — Ambulatory Visit: Payer: Medicare Other | Admitting: *Deleted

## 2017-12-05 DIAGNOSIS — R2689 Other abnormalities of gait and mobility: Secondary | ICD-10-CM | POA: Diagnosis not present

## 2017-12-05 DIAGNOSIS — I69354 Hemiplegia and hemiparesis following cerebral infarction affecting left non-dominant side: Secondary | ICD-10-CM

## 2017-12-05 DIAGNOSIS — M6281 Muscle weakness (generalized): Secondary | ICD-10-CM

## 2017-12-05 DIAGNOSIS — R41841 Cognitive communication deficit: Secondary | ICD-10-CM

## 2017-12-05 DIAGNOSIS — R41842 Visuospatial deficit: Secondary | ICD-10-CM

## 2017-12-05 DIAGNOSIS — R2681 Unsteadiness on feet: Secondary | ICD-10-CM

## 2017-12-05 DIAGNOSIS — R278 Other lack of coordination: Secondary | ICD-10-CM

## 2017-12-05 DIAGNOSIS — I69318 Other symptoms and signs involving cognitive functions following cerebral infarction: Secondary | ICD-10-CM

## 2017-12-05 NOTE — Therapy (Signed)
[facility] [address] [place], [place], [address] Phone: [phone]   Fax:  [phone]  Speech Language Pathology Evaluation  Patient Details  Name: Thomas [name]. MRN: [id number] Date of Birth: [birth year] Referring Provider: Ranelle Oyster, MD   Encounter Date: 12/05/2017  End of Session - 12/05/17 1847    Visit Number  1    Number of Visits  17    Date for SLP Re-Evaluation  03/07/18 extended re-eval date due to scheduling    SLP Start Time  0935    SLP Stop Time   1015    SLP Time Calculation (min)  40 min    Activity Tolerance  Patient tolerated treatment well       Past Medical History:  Diagnosis Date  . CHF (congestive heart failure) (HCC) 1999  . CKD (chronic kidney disease), stage II   . Coronary artery disease   . Hx of colonic polyps   . Hyperlipidemia   . Hyperplastic colon polyp 04/15/2007  . Hypertension   . Prostatic hypertrophy, benign    with elevated PSA  . TIA (transient ischemic attack) 2003   "mini stroke" (03/19/2013)    Past Surgical History:  Procedure Laterality Date  . CARDIAC CATHETERIZATION    . CATARACT EXTRACTION W/ INTRAOCULAR LENS  IMPLANT, BILATERAL Bilateral 1998  . COLONOSCOPY  2008  . CORONARY ARTERY BYPASS GRAFT  1999   x4  . LOOP RECORDER INSERTION N/A 11/05/2017   Procedure: LOOP RECORDER INSERTION;  Surgeon: Hillis Range, MD;  [facility];  Service: Cardiovascular;  Laterality: N/A;  . TEE WITHOUT CARDIOVERSION N/A 11/05/2017   Procedure: TRANSESOPHAGEAL ECHOCARDIOGRAM (TEE) WITH LOOP;  Surgeon: Lars Masson, MD;  Location: [facility] ENDOSCOPY;  Service: Cardiovascular;  Laterality: N/A;    There were no vitals filed for this visit.  Subjective Assessment - 12/05/17 0940    Subjective  "You'll have ask to ask her (wife)" Re: deficits    Currently in Pain?  No/denies         SLP Evaluation [facility] - 12/05/17 0940      SLP Visit  Information   SLP Received On  12/05/17    Referring Provider  Ranelle Oyster, MD    Onset Date  11/06/17    Medical Diagnosis  R CVA      General Information   HPI  Thomas Schoenberger. is a 77 y.o. 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 MRI showed Right PCA infarction involving medial temporal lobe and occipital lobe. Punctate foci of infarction in right cerebellum, splenium of corpus callosum, and parietal lobe. Small area of petechial hemorrhage in right posteromedial temporal lobe. Severe chronic microvascular ischemic changes and moderate parenchymal volume loss of the brain. Chronic lacunar infarcts in      Balance Screen   Has the patient fallen in the past 6 months  Yes    How many times?  -- several; currently on PT caseload    Has the patient had a decrease in activity level because of a fear of falling?   Yes      Prior Functional Status   Cognitive/Linguistic Baseline  Information not available pt/wife deny; question deficits with review of MRI    Type of Home  House     Lives With  Spouse  Available Support  Family    Vocation  Retired      Pain Assessment   Pain Assessment  No/denies pain      Cognition   Overall Cognitive Status  Impaired/Different from baseline    Area of Impairment  Attention;Memory;Awareness;Safety/judgement;Problem solving;Following commands    Current Attention Level  Sustained    Memory  Decreased short-term memory;Decreased recall of precautions    Following Commands  Follows multi-step commands inconsistently;Follows multi-step commands with increased time    Safety/Judgement  Decreased awareness of safety;Decreased awareness of deficits    Safety and Judgement Comments  pt with multiple falls since d/c home    Awareness  Intellectual    Problem Solving  Slow processing;Decreased initiation;Difficulty  sequencing;Requires verbal cues    Attention  Sustained    Sustained Attention  Impaired    Sustained Attention Impairment  Functional basic;Verbal basic    Memory  Impaired    Memory Impairment  Storage deficit;Decreased recall of new information;Decreased short term memory    Decreased Short Term Memory  Verbal basic;Functional basic design memory 2/6    Awareness  Impaired    Awareness Impairment  Intellectual impairment    Problem Solving  Impaired    Problem Solving Impairment  Verbal basic;Functional basic    Probation officer;Sequencing;Organizing;Decision Making;Self Monitoring;Self Correcting    Reasoning  Impaired    Reasoning Impairment  Verbal basic;Functional basic    Sequencing  Impaired    Sequencing Impairment  Functional basic trailmaking impaired    Organizing  Impaired    Organizing Impairment  -- clock drawing poor spacing and arrangement    Self Monitoring  Impaired    Self Monitoring Impairment  Functional basic    Self Correcting  Impaired    Self Correcting Impairment  Functional basic    Behaviors  Poor frustration tolerance;Impulsive frustration with mazes, Recruitment consultant Comprehension   Overall Auditory Comprehension  Impaired    Commands  Impaired    Complex Commands  75-100% accurate appears to be attention/slow processing vs language    Conversation  Simple    Interfering Components  Attention      Visual Recognition/Discrimination   Discrimination  Exceptions to [facility]  left inattention, left field cut      Reading Comprehension   Reading Status  Not tested      Expression   Primary Mode of Expression  Verbal      Verbal Expression   Overall Verbal Expression  Appears within functional limits for tasks assessed    Naming  -- Divergent naming 4/10; WNL for age    Pragmatics  Impairment    Impairments  Eye contact    Interfering Components  Attention      Written Expression   Dominant Hand  Right    Written  Expression  Not tested      Oral Motor/Sensory Function   Overall Oral Motor/Sensory Function  Appears within functional limits for tasks assessed      Motor Speech   Overall Motor Speech  Appears within functional limits for tasks assessed      Standardized Assessments   Standardized Assessments   Cognitive Linguistic Quick Test      Cognitive Linguistic Quick Test (Ages 18-69)   Attention  Moderate    Memory  Mild    Executive Function  Severe    Language  WNL    Visuospatial Skills  Moderate    Severity Rating  Total  12    Composite Severity Rating  10.4                      SLP Education - 12/05/17 1849    Education provided  Yes    Education Details  deficit areas, proposed therapy goals    Person(s) Educated  Patient;Spouse    Methods  Explanation    Comprehension  Verbalized understanding       SLP Short Term Goals - 12/05/17 1839      SLP SHORT TERM GOAL #1   Title  Pt will attend to simple cognitive linguistic tasks in mildly distracting environment for 10 minutes over 2 sessions     Time  4    Period  Weeks    Status  New      SLP SHORT TERM GOAL #2   Title  Pt will ID and correct 3/5 errors on simple cognitive linguistic tasks with occasional min A over 2 sessions.     Time  4    Period  Weeks      SLP SHORT TERM GOAL #3   Title  Pt will complete mildy complex organization, reasoning, problem solving tasks with 85% accuracy and occasional min A    Time  4    Period  Weeks    Status  New      SLP SHORT TERM GOAL #4   Title  Pt will utilize external aids for functional recall and schedule management with rare min A over 2 sessions    Time  4    Period  Weeks    Status  New       SLP Long Term Goals - 12/05/17 1846      SLP LONG TERM GOAL #1   Title  Pt will alternate attention between 2 simple cognitive linguistic tasks with 80% accuracy on each and occasional min A     Time  8    Period  Weeks    Status  New      SLP LONG TERM  GOAL #2   Title  Pt will self correct errors 4/5 errors with occasional min A     Time  8    Period  Weeks    Status  New      SLP LONG TERM GOAL #3   Title   Pt will report daily functional and effective use of compensatory strategies for recall of daily activities and appointments    Time  8    Period  Weeks    Status  New      SLP LONG TERM GOAL #4   Title  Spouse will demonstrate 2 strategies to improve pt's attending to and follow through of her directions/safety precautions with rare min A over 3 sessions    Time  8    Period  Weeks    Status  New       Plan - 12/05/17 1836    Clinical Impression Statement  Patient presents with moderate cognitive communication impairment; deficits seen in attention, executive function (planning, reasoning, organization, awareness), visuospatial skills, memory. Pt has history of prior CVA in 2014; was not evaluated by SLP at that time. Administered CLQT with findings overall severity rating of moderate (ages 13-89). Decreased initiation, frustration tolerance, impulsivity noted during testing. Pt with left inattention and left visual field cut, impacting his performance on symbol cancellation and trailmaking tasks. During practice tasks, SLP redirected pt's attention to missed  items on the left, however he did not employ this compensation during scored activity. Pt initially denied deficits, however after testing acknowledged that he had more difficulty than he expected. Pt has had falls since d/c from [facility]; reportedly due to decreased awareness of deficits and of safety, not attending to wife's instructions. I recommend skilled ST to train spouse in strategies to improve his attention to her directions, to maximize cognition for improved safety and to reduce caregiver burden.      Speech Therapy Frequency  2x / week    Duration  -- 8 weeks or 16 additional visits    Treatment/Interventions  Cognitive reorganization;Compensatory strategies;Patient/family  education;SLP instruction and feedback;Functional tasks;Internal/external aids;Cueing hierarchy    Potential to Achieve Goals  Good    Potential Considerations  Severity of impairments;Ability to learn/carryover information;Previous level of function    Consulted and Agree with Plan of Care  Patient;Family member/caregiver       Patient will benefit from skilled therapeutic intervention in order to improve the following deficits and impairments:   Cognitive communication deficit    Problem List Patient Active Problem List   Diagnosis Date Noted  . Foul smelling urine   . Leukocytosis   . Hyponatremia   . 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 Benign essential HTN   . Coronary artery disease involving native coronary artery of native heart without angina pectoris   . Stage 3 chronic kidney disease (HCC)   . Acute lower UTI   . Seasonal allergies   . Stroke (HCC) 11/04/2017  . HLD (hyperlipidemia) 11/04/2017  . Chronic diastolic CHF (congestive heart failure) (HCC) 11/04/2017  . Acute renal failure superimposed on stage 2 chronic kidney disease (HCC) 11/04/2017  . Acute metabolic encephalopathy 11/04/2017  . UTI (urinary tract infection) 11/04/2017  . AKI (acute kidney injury) (HCC)   . History of CVA with residual deficit   . Prediabetes   . Left hemiparesis (HCC) 03/19/2013  . Cerebral infarction (HCC) 03/19/2013  . Left leg weakness 03/19/2013  . CAD, ARTERY BYPASS GRAFT 09/30/2008  . 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 Borderline hyperglycemia 12/03/2007  . Other and unspecified hyperlipidemia 07/28/2007  . Essential hypertension 07/28/2007  . 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 Coronary atherosclerosis 07/28/2007  . BENIGN PROSTATIC HYPERTROPHY 07/28/2007  . COLONIC POLYPS, 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   Rondel Baton, MS, CCC-SLP Speech-Language Pathologist  Arlana Lindau 12/05/2017, 6:50 PM  [facility] [address] [place], [place], [address] Phone: [phone]   Fax:  [phone]  Name: Thomas [name]. MRN: [id number] Date of Birth: 1940-11-13

## 2017-12-05 NOTE — Telephone Encounter (Signed)
PT called patient's wife, Thomas [name], to determine if she was planning to return to outpatient therapy today after recent hospital admission with Thomas [name] for UTI.  He was seen by nurse practitioner at [facility] yesterday and the patient's wife notes that she has been able to get him safely into the car/out of the car and use w/c for mobility into appointments.  She prefers to continue with outpatient physical therapy at this time.  Will resume prior plan of care modifying as needed due to recent change in medical status with UTI.  [facility] note has continue PT/OT/ST in plan and therefore, we will resume care. Endi Lagman, PT

## 2017-12-05 NOTE — Therapy (Signed)
[facility] [address] [place], [place], [address] Phone: [phone]   Fax:  [phone]  Occupational Therapy Treatment  Patient Details  Name: Thomas [name]. MRN: [id number] Date of Birth: [birth year] Referring Provider: Faith Rogue, MD   Encounter Date: 12/05/2017  OT End of Session - 12/05/17 1151    Visit Number  2    Number of Visits  17    Date for OT Re-Evaluation  01/28/18    Authorization Type  [employer] MCR    OT Start Time  1100    OT Stop Time  1142    OT Time Calculation (min)  42 min    Activity Tolerance  Patient limited by fatigue    Behavior During Therapy  [facility] for tasks assessed/performed       Past Medical History:  Diagnosis Date  . CHF (congestive heart failure) (HCC) 1999  . CKD (chronic kidney disease), stage II   . Coronary artery disease   . Hx of colonic polyps   . Hyperlipidemia   . Hyperplastic colon polyp 04/15/2007  . Hypertension   . Prostatic hypertrophy, benign    with elevated PSA  . TIA (transient ischemic attack) 2003   "mini stroke" (03/19/2013)    Past Surgical History:  Procedure Laterality Date  . CARDIAC CATHETERIZATION    . CATARACT EXTRACTION W/ INTRAOCULAR LENS  IMPLANT, BILATERAL Bilateral 1998  . COLONOSCOPY  2008  . CORONARY ARTERY BYPASS GRAFT  1999   x4  . LOOP RECORDER INSERTION N/A 11/05/2017   Procedure: LOOP RECORDER INSERTION;  Surgeon: Hillis Range, MD;  [facility] CV LAB;  Service: Cardiovascular;  Laterality: N/A;  . TEE WITHOUT CARDIOVERSION N/A 11/05/2017   Procedure: TRANSESOPHAGEAL ECHOCARDIOGRAM (TEE) WITH LOOP;  Surgeon: Lars Masson, MD;  Location: [facility] ENDOSCOPY;  Service: Cardiovascular;  Laterality: N/A;    There were no vitals filed for this visit.  Subjective Assessment - 12/05/17 1104    Subjective   Pt presents today with his wife and they report a fall at home yesterday. Pt with recent hopsital admission  secondary to UTI, per MD f/u note from 12/04/17, pt is to cont OT, PT, SLP. See telephone encounter this date.    Patient is accompained by:  Family member Wife - [name]    Pertinent History  Rt CVA 11/03/17. PMH: HTN, CHF, CKD stage 2, previous CVA 2014 with residual Lt LE weakness    Patient Stated Goals  Get back to doing things I used to do    Currently in Pain?  No/denies    Pain Score  0-No pain    Multiple Pain Sites  No                   OT Treatments/Exercises (OP) - 12/05/17 0001      Visual/Perceptual Exercises   Scanning  Tabletop    Visual Motor Integration  Visual scanning exercises at tabletop level using a line guide to assist: cross out all the 88's, cross out all the double numbersCopy the numbers from the left side of the page and copy to the right side; cross out #1-20 in order and 20-30 in order. Overall Mod difficulty noted, increased time and vc's to attend to left secondary to visual field cut as well as to complete task. Multiple rest breaks as needed in minimally distracting environment x40 min overall.             OT Education -  12/05/17 1150    Education provided  Yes    Education Details  Discussed strategies and verbal cues to attend/scan to left as well as use of line guide when able.    Person(s) Educated  Patient;Spouse    Methods  Explanation;Demonstration;Verbal cues;Tactile cues    Comprehension  Verbalized understanding;Need further instruction       OT Short Term Goals - 11/28/17 1257      OT SHORT TERM GOAL #1   Title  Pt/family will verbalize understanding with visual scanning strategies and memory compensatory strategies    Time  4    Period  Weeks    Status  New    Target Date  12/28/17      OT SHORT TERM GOAL #2   Title  Pt/family will verbalize understanding with coordination HEP for bilateral hands    Time  4    Period  Weeks    Status  New      OT SHORT TERM GOAL #3   Title  Pt will perform LE dressing with min  assist    Time  4    Period  Weeks    Status  New      OT SHORT TERM GOAL #4   Title  Pt will perform toilet transfer with min assist and clothes management with no more than mod assist    Time  4    Period  Weeks    Status  New      OT SHORT TERM GOAL #5   Title  Pt will attend to Lt side for near body and tabletop scanning with > 75% accuracy    Time  4    Period  Weeks    Status  New        OT Long Term Goals - 11/28/17 1301      OT LONG TERM GOAL #1   Title  Independent with updated HEP for UE's    Time  8    Period  Weeks    Status  New      OT LONG TERM GOAL #2   Title  Pt will perform LE dressing with set up/sup only    Time  8    Period  Weeks    Status  New      OT LONG TERM GOAL #3   Title  Pt will perform toilet transfer and tub transfer with close sup only using DME prn    Time  8    Period  Weeks    Status  New      OT LONG TERM GOAL #4   Title  Pt will perform clothes management after toileting mod I level    Time  8    Period  Weeks    Status  New      OT LONG TERM GOAL #5   Title  Pt will perform simple snack prep at mod I level using rollator or tray on walker    Time  8    Period  Weeks    Status  New      Long Term Additional Goals   Additional Long Term Goals  Yes      OT LONG TERM GOAL #6   Title  Pt to perform medication management with direct sup/min cueing prn    Time  8    Period  Weeks    Status  New  Plan - 12/05/17 1152    Clinical Impression Statement  Pt was hospitialized 12/01/17-12/03/17 secondary to UTI. Pt f/u with PA in Dr Faith Rogue Office whom stated to cont with out-pt OT, PT and SLP (See telephone encounter 12/05/17). Focused treatment session on table top visual scanning today with multiple rest breaks and use of line guide as well as vc's/tc's to scan to left.     Occupational Profile and client history currently impacting functional performance  PMH: HTN, CHF, CKD (stage 2), previous Rt CVA  2014, HOH    Occupational performance deficits (Please refer to evaluation for details):  ADL's;IADL's;Leisure;Social Participation    Rehab Potential  Fair    Current Impairments/barriers affecting progress:  severity of deficits including cognition and decreased attention and field cut to Lt side    OT Frequency  2x / week    OT Duration  8 weeks plus evaluation    OT Treatment/Interventions  Self-care/ADL training;DME and/or AE instruction;Moist Heat;Aquatic Therapy;Therapeutic activities;Therapeutic exercise;Cognitive remediation/compensation;Coping strategies training;Neuromuscular education;Functional Mobility Training;Passive range of motion;Visual/perceptual remediation/compensation;Manual Therapy;Patient/family education    Plan  Memory strategies, attention to L side/NMR.    Clinical Decision Making  Several treatment options, min-mod task modification necessary    Consulted and Agree with Plan of Care  Patient;Family member/caregiver    Family Member Consulted  Wife       Patient will benefit from skilled therapeutic intervention in order to improve the following deficits and impairments:  Decreased coordination, Decreased range of motion, Difficulty walking, Improper body mechanics, Decreased endurance, Decreased safety awareness, Decreased activity tolerance, Decreased knowledge of precautions, Impaired tone, Impaired UE functional use, Decreased knowledge of use of DME, Decreased balance, Decreased cognition, Decreased mobility, Decreased strength, Impaired perceived functional ability, Impaired vision/preception  Visit Diagnosis: Muscle weakness (generalized)  Other lack of coordination  Unsteadiness on feet  Other symptoms and signs involving cognitive functions following cerebral infarction  Visuospatial deficit  Hemiplegia and hemiparesis following cerebral infarction affecting left non-dominant side [facility])    Problem List Patient Active Problem List   Diagnosis Date  Noted  . Foul smelling urine   . Leukocytosis   . Hyponatremia   . 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 Benign essential HTN   . Coronary artery disease involving native coronary artery of native heart without angina pectoris   . Stage 3 chronic kidney disease (HCC)   . Acute lower UTI   . Seasonal allergies   . Stroke (HCC) 11/04/2017  . HLD (hyperlipidemia) 11/04/2017  . Chronic diastolic CHF (congestive heart failure) (HCC) 11/04/2017  . Acute renal failure superimposed on stage 2 chronic kidney disease (HCC) 11/04/2017  . Acute metabolic encephalopathy 11/04/2017  . UTI (urinary tract infection) 11/04/2017  . AKI (acute kidney injury) (HCC)   . History of CVA with residual deficit   . Prediabetes   . Left hemiparesis (HCC) 03/19/2013  . Cerebral infarction (HCC) 03/19/2013  . Left leg weakness 03/19/2013  . CAD, ARTERY BYPASS GRAFT 09/30/2008  . 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 Borderline hyperglycemia 12/03/2007  . Other and unspecified hyperlipidemia 07/28/2007  . Essential hypertension 07/28/2007  . 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 Coronary atherosclerosis 07/28/2007  . BENIGN PROSTATIC HYPERTROPHY 07/28/2007  . COLONIC POLYPS, 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    Charletta Cousin Amy Dionicio Stall, OTR/L 12/05/2017, 11:58 AM  [facility] [address] [place], [place], [address] Phone: [phone]   Fax:  [phone]  Name: Thomas [name].  MRN: [id number] Date of Birth: 1941/02/28

## 2017-12-06 ENCOUNTER — Ambulatory Visit: Payer: Medicare Other | Admitting: Rehabilitation

## 2017-12-06 ENCOUNTER — Encounter: Payer: Self-pay | Admitting: Rehabilitation

## 2017-12-06 ENCOUNTER — Ambulatory Visit (INDEPENDENT_AMBULATORY_CARE_PROVIDER_SITE_OTHER): Payer: Medicare Other | Admitting: *Deleted

## 2017-12-06 DIAGNOSIS — R293 Abnormal posture: Secondary | ICD-10-CM

## 2017-12-06 DIAGNOSIS — R2689 Other abnormalities of gait and mobility: Secondary | ICD-10-CM | POA: Diagnosis not present

## 2017-12-06 DIAGNOSIS — I6340 Cerebral infarction due to embolism of unspecified cerebral artery: Secondary | ICD-10-CM

## 2017-12-06 DIAGNOSIS — M6281 Muscle weakness (generalized): Secondary | ICD-10-CM

## 2017-12-06 NOTE — Therapy (Signed)
[facility] [address] [place], [place], [address] Phone: [phone]   Fax:  [phone]  Physical Therapy Treatment  Patient Details  Name: Thomas [name]. MRN: [id number] Date of Birth: 02/05/1941 Referring Provider: Faith Rogue, MD   Encounter Date: 12/06/2017  PT End of Session - 12/06/17 1421    Visit Number  2    Number of Visits  17 eval + 2x/week for 8 weeks    Date for PT Re-Evaluation  01/27/18    Authorization Type  [employer] medicare     PT Start Time  1316    PT Stop Time  1402    PT Time Calculation (min)  46 min    Equipment Utilized During Treatment  Gait belt    Activity Tolerance  Patient tolerated treatment well    Behavior During Therapy  WFL for tasks assessed/performed       Past Medical History:  Diagnosis Date  . CHF (congestive heart failure) (HCC) 1999  . CKD (chronic kidney disease), stage II   . Coronary artery disease   . Hx of colonic polyps   . Hyperlipidemia   . Hyperplastic colon polyp 04/15/2007  . Hypertension   . Prostatic hypertrophy, benign    with elevated PSA  . TIA (transient ischemic attack) 2003   "mini stroke" (03/19/2013)    Past Surgical History:  Procedure Laterality Date  . CARDIAC CATHETERIZATION    . CATARACT EXTRACTION W/ INTRAOCULAR LENS  IMPLANT, BILATERAL Bilateral 1998  . COLONOSCOPY  2008  . CORONARY ARTERY BYPASS GRAFT  1999   x4  . LOOP RECORDER INSERTION N/A 11/05/2017   Procedure: LOOP RECORDER INSERTION;  Surgeon: Hillis Range, MD;  [facility]: [facility] INVAS[facility] LAB;  Service: Cardiovascular;  Laterality: N/A;  . TEE WITHOUT CARDIOVERSION N/A 11/05/2017   Procedure: TRANSESOPHAGEAL ECHOCARDIOGRAM (TEE) WITH LOOP;  Surgeon: Lars Masson, MD;  Location: [facility] ENDOSCOPY;  Service: Cardiovascular;  Laterality: N/A;    There were no vitals filed for this visit.  Subjective Assessment - 12/06/17 1411    Subjective  Pt reports pain in L hip with  sustained movement, none at rest    Patient is accompained by:  Family member    Pertinent History  CKD, prior CVA using RW prior to recent admission, HTN, CHF.    Patient Stated Goals  Moving "a whole lot easier than I am now."  Wife notes she wants him to get stronger and get back to where he can have a little more independence.    Currently in Pain?  No/denies                       [facility] Adult PT Treatment/Exercise - 12/06/17 0001      Transfers   Transfers  Sit to Stand;Stand to Sit;Stand Pivot Transfers    Sit to Stand  4: Min guard;5: Supervision    Sit to Stand Details  Verbal cues for sequencing;Verbal cues for technique;Manual facilitation for weight shifting    Sit to Stand Details (indicate cue type and reason)  Initially pt requires min/guard with cues for forward weight shift and safe hand placement.     Stand to Sit  4: Min guard;5: Supervision    Stand to Sit Details (indicate cue type and reason)  Verbal cues for sequencing;Verbal cues for technique;Verbal cues for precautions/safety;Manual facilitation for weight shifting    Stand to Sit Details  again, initially assist to control  descent and facilitate forward trunk lean but did better with cues.     Stand Pivot Transfers  4: Min guard      Ambulation/Gait   Ambulation/Gait  Yes    Ambulation/Gait Assistance  4: Min assist    Ambulation/Gait Assistance Details  Had pt ambulate short distance to w/c from mat at end of session with cues for safe turning and turning to look at positioning at chair prior to sitting.     Ambulation Distance (Feet)  25 Feet    Assistive device  Rolling walker    Gait Pattern  Step-through pattern;Decreased step length - right;Decreased step length - left;Left genu recurvatum;Decreased trunk rotation;Decreased weight shift to left    Ambulation Surface  Level;Indoor      Self-Care   Self-Care  Other Self-Care Comments    Other Self-Care Comments   Had frank discussion with pt  and family regarding pts high fall risk.  PT feels that he will continue to fall due to CVA deficits along with pt being stubborn (per wife report-unsure she is fully aware of pts deficits).  Educated on purchase of chair alarm or video baby monitor so wife will be able to leave room without fear of him getting up.  Also recommend she keep RW near him because he is going to get up regardless of being told by wife, therefore RW will increase safety.  Pt and wife verbalized understanding, however feel that this will need to continuously be addressed.        Neuro Re-ed    Neuro Re-ed Details   NMR for LLE strengthening and control with BLE bridging x 10 reps (did well however once he fatigued, requires more cues for control of LLE to prevent abd), supine L heel slide x 10 reps again with cues for keeping heel down as he brings towards bottom (cues to remove shoes at home for decreased friction) also cues for keeping leg in line with L shoulder.  Attempted SL clam abd for LLE, however due to motor planning/perceptual/apraxia deficits pt unable to perform.  Performed sit<>stand x 10 reps with walker in front of pt for safety.  Cues for scooting to edge of mat and increase forward weight shift to avoid hitting backs of legs on mat.  Education for placing chair against wall at home to prevent fall.       Exercises   Exercises  Knee/Hip      Knee/Hip Exercises: Stretches   Active Hamstring Stretch  Both;1 rep;30 seconds    Active Hamstring Stretch Limitations  Max cues for posture, foot placed on stool.     Gastroc Stretch  Both;1 rep;30 seconds    Gastroc Stretch Limitations  with LE on stool with use of strap             PT Education - 12/06/17 1421    Education provided  Yes    Education Details  see self care, HEP    Person(s) Educated  Patient;Spouse    Methods  Explanation;Demonstration;Handout    Comprehension  Verbalized understanding       PT Short Term Goals - 11/28/17 1118       PT SHORT TERM GOAL #1   Title  The patient will be indep with HEP for LE strengthening, posture, stretching and general mobility.    Time  4    Period  Weeks    Target Date  12/28/17      PT SHORT TERM GOAL #2  Title  The patient will improve Berg balance score from 7/56 to > or equal to 16/56 to demonstrate improving independence for standing balance and transfers.    Time  4    Period  Weeks    Target Date  12/28/17      PT SHORT TERM GOAL #3   Title  The patient will move sit<>stand with UE support 5/5 trials mod indep to RW.    Time  4    Period  Weeks    Target Date  12/28/17      PT SHORT TERM GOAL #4   Title  The patient will be further assessed on gait speed and LTG to follow.    Time  4    Period  Weeks    Target Date  12/28/17      PT SHORT TERM GOAL #5   Title  The patient will ambulate x 200 ft nonstop with RW and CGA to demonstrate improving endurance and dec'd caregiver assist.    Time  4    Period  Weeks    Target Date  12/28/17        PT Long Term Goals - 11/28/17 1121      PT LONG TERM GOAL #1   Title  The patient will improve functional status score from 21% to > or equal to 35% to demo improved perception of functional abiltiies.    Time  8    Period  Weeks    Target Date  01/27/18      PT LONG TERM GOAL #2   Title  The patient will improve gait speed *baseline to be established after eval.    Time  8    Period  Weeks    Target Date  01/27/18      PT LONG TERM GOAL #3   Title  The patient will improve Berg score from 7/56 to > or equal to 22/56 to demo improving standing balance for ADLs/functional tasks.    Time  8    Period  Weeks    Target Date  01/27/18      PT LONG TERM GOAL #4   Title  The patient will negotiate 4 steps with one HR with supervision for safe entrance/exit from home.    Time  8    Period  Weeks    Target Date  01/27/18      PT LONG TERM GOAL #5   Title  The patient will ambulate x 350 ft with RW mod indep for  improved household and short distance community negotiation to reduce caregiver assist.    Time  8    Period  Weeks    Target Date  01/27/18      Additional Long Term Goals   Additional Long Term Goals  Yes      PT LONG TERM GOAL #6   Title  The patient will move floor<>stand with UE support and min A due to h/o falls.    Time  8    Period  Weeks    Target Date  01/27/18            Plan - 12/06/17 1422    Clinical Impression Statement  Session focused on discussion regarding pts high fall risk and recommendation for chair alarm vs video baby monitor to help decrease falls.  Also initiated HEP for LLE NMR/strength and BLE flexibility.      Rehab Potential  Good    PT Frequency  2x / week + evaluation    PT Duration  8 weeks    PT Treatment/Interventions  ADLs/Self Care Home Management;Gait training;Stair training;Functional mobility training;Therapeutic activities;Therapeutic exercise;Neuromuscular re-education;Balance training;Patient/family education;Orthotic Fit/Training;Manual techniques    PT Next Visit Plan  check compliance with HEP, seated postural strengthening.  Standing balance in clinic working on dec'ing UE support, gait training emphasizing endurance and watching for L visual field cues to avoid environmental obstacles.    Consulted and Agree with Plan of Care  Patient       Patient will benefit from skilled therapeutic intervention in order to improve the following deficits and impairments:  Abnormal gait, Decreased endurance, Decreased activity tolerance, Decreased strength, Decreased safety awareness, Impaired flexibility, Postural dysfunction, Decreased balance, Decreased mobility  Visit Diagnosis: Muscle weakness (generalized)  Abnormal posture  Other abnormalities of gait and mobility     Problem List Patient Active Problem List   Diagnosis Date Noted  . Foul smelling urine   . Leukocytosis   . Hyponatremia   . 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 Benign essential HTN   . Coronary artery disease involving native coronary artery of native heart without angina pectoris   . Stage 3 chronic kidney disease (HCC)   . Acute lower UTI   . Seasonal allergies   . Stroke (HCC) 11/04/2017  . HLD (hyperlipidemia) 11/04/2017  . Chronic diastolic CHF (congestive heart failure) (HCC) 11/04/2017  . Acute renal failure superimposed on stage 2 chronic kidney disease (HCC) 11/04/2017  . Acute metabolic encephalopathy 11/04/2017  . UTI (urinary tract infection) 11/04/2017  . AKI (acute kidney injury) (HCC)   . History of CVA with residual deficit   . Prediabetes   . Left hemiparesis (HCC) 03/19/2013  . Cerebral infarction (HCC) 03/19/2013  . Left leg weakness 03/19/2013  . CAD, ARTERY BYPASS GRAFT 09/30/2008  . 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 Borderline hyperglycemia 12/03/2007  . Other and unspecified hyperlipidemia 07/28/2007  . Essential hypertension 07/28/2007  . 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 Coronary atherosclerosis 07/28/2007  . BENIGN PROSTATIC HYPERTROPHY 07/28/2007  . COLONIC POLYPS, 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   Harriet Butte, PT, MPT [facility] [address] [place], [place], [address] Phone: [phone]   Fax:  [phone] 12/06/17, 2:26 PM  Name: Thomas [name]. MRN: [id number] Date of Birth: 12-09-1940

## 2017-12-06 NOTE — Progress Notes (Signed)
Carelink Summary Report / Loop Recorder 

## 2017-12-06 NOTE — Patient Instructions (Addendum)
Bridging    Slowly raise buttocks from floor, keeping stomach tight. Repeat _10___ times per set. Do __1__ sets per session. Do _2___ sessions per day.  http://orth.exer.us/1096   Copyright  VHI. All rights reserved.   Sit to Stand: Head Upright    With head upright, stand up slowly with eyes open.  Place chair against wall for safety.  Repeat __10__ times per session. Do __2__ sessions per day.  Copyright  VHI. All rights reserved.   Heel Slides    Squeeze pelvic floor and hold. Slide left heel along bed towards bottom. Hold for __5_ seconds. Slide back to flat knee position. Repeat __10_ times. Do _2__ times a day. Repeat with other leg.  Make sure leg stays in line with body.     Copyright  VHI. All rights reserved.   Knee Extension (Hamstring Stretch) With Pelvis Neutral    Slowly and gently bring foot up and place on step stool. Be sure pelvis does not tip backward (flex) or rotate.  Sit up tall and try to get knee straight. Hold _30-45__ seconds. Do _3__ times, each leg, _2__ times per day.  http://ss.exer.us/88   Copyright  VHI. All rights reserved.   Gastroc / Heel Cord Stretch - Seated With Towel    Sit in chair with foot on stool, place strap or towel around ball of foot. Gently pull foot in toward body, stretching heel cord and calf. Hold for _30-45__ seconds. Repeat on involved leg. Repeat _3__ times. Do __2_ times per day.  Copyright  VHI. All rights reserved.

## 2017-12-10 ENCOUNTER — Encounter: Payer: Self-pay | Admitting: Speech Pathology

## 2017-12-10 ENCOUNTER — Ambulatory Visit: Payer: Medicare Other | Admitting: Occupational Therapy

## 2017-12-10 ENCOUNTER — Ambulatory Visit: Payer: Medicare Other | Admitting: Speech Pathology

## 2017-12-10 DIAGNOSIS — I69318 Other symptoms and signs involving cognitive functions following cerebral infarction: Secondary | ICD-10-CM

## 2017-12-10 DIAGNOSIS — R2689 Other abnormalities of gait and mobility: Secondary | ICD-10-CM | POA: Diagnosis not present

## 2017-12-10 DIAGNOSIS — R41841 Cognitive communication deficit: Secondary | ICD-10-CM

## 2017-12-10 DIAGNOSIS — R41842 Visuospatial deficit: Secondary | ICD-10-CM

## 2017-12-10 DIAGNOSIS — R278 Other lack of coordination: Secondary | ICD-10-CM

## 2017-12-10 NOTE — Therapy (Signed)
[facility] [address] [place], [place], [address] Phone: [phone]   Fax:  [phone]  Speech Language Pathology Treatment  Patient Details  Name: Thomas [name]. MRN: [id number] Date of Birth: [birth year] Referring Provider: Ranelle Oyster, MD   Encounter Date: 12/10/2017  End of Session - 12/10/17 1215    Visit Number  2    Number of Visits  17    Date for SLP Re-Evaluation  03/07/18    SLP Start Time  1103    SLP Stop Time   1145    SLP Time Calculation (min)  42 min    Activity Tolerance  Patient tolerated treatment well       Past Medical History:  Diagnosis Date  . CHF (congestive heart failure) (HCC) 1999  . CKD (chronic kidney disease), stage II   . Coronary artery disease   . Hx of colonic polyps   . Hyperlipidemia   . Hyperplastic colon polyp 04/15/2007  . Hypertension   . Prostatic hypertrophy, benign    with elevated PSA  . TIA (transient ischemic attack) 2003   "mini stroke" (03/19/2013)    Past Surgical History:  Procedure Laterality Date  . CARDIAC CATHETERIZATION    . CATARACT EXTRACTION W/ INTRAOCULAR LENS  IMPLANT, BILATERAL Bilateral 1998  . COLONOSCOPY  2008  . CORONARY ARTERY BYPASS GRAFT  1999   x4  . LOOP RECORDER INSERTION N/A 11/05/2017   Procedure: LOOP RECORDER INSERTION;  Surgeon: Hillis Range, MD;  Location: [facility] INVASIVE CV LAB;  Service: Cardiovascular;  Laterality: N/A;  . TEE WITHOUT CARDIOVERSION N/A 11/05/2017   Procedure: TRANSESOPHAGEAL ECHOCARDIOGRAM (TEE) WITH LOOP;  Surgeon: Lars Masson, MD;  Location: [facility] ENDOSCOPY;  Service: Cardiovascular;  Laterality: N/A;    There were no vitals filed for this visit.  Subjective Assessment - 12/10/17 1212    Subjective  "I let the dog in and out" Spouse dropped pt off for therapy today            ADULT SLP TREATMENT - 12/10/17 1129      General Information   Behavior/Cognition   Alert;Cooperative;Pleasant mood      Treatment Provided   Treatment provided  Cognitive-Linquistic      Cognitive-Linquistic Treatment   Treatment focused on  Cognition    Skilled Treatment  Facilitated selective attention in simple card sort with interjecting conversation and door open. Pt required ongoing cues for reasoning  and visual and verbal recall of rules, however, pt re-attended to card sort with supervision cues after conversation over 18 minutes. Simple reasoning ID ing word that doesn't belong out of 4 and reason why - initially with mod A, however as task progressed, pt improved to occasional min A (verbal semantic/category cues.       Assessment / Recommendations / Plan   Plan  Continue with current plan of care      Progression Toward Goals   Progression toward goals  Progressing toward goals         SLP Short Term Goals - 12/10/17 1214      SLP SHORT TERM GOAL #1   Title  Pt will attend to simple cognitive linguistic tasks in mildly distracting environment for 10 minutes over 2 sessions     Time  4    Period  Weeks    Status  On-going      SLP SHORT TERM GOAL #2   Title  Pt will ID and correct  3/5 errors on simple cognitive linguistic tasks with occasional min A over 2 sessions.     Time  4    Period  Weeks      SLP SHORT TERM GOAL #3   Title  Pt will complete mildy complex organization, reasoning, problem solving tasks with 85% accuracy and occasional min A    Time  4    Period  Weeks    Status  On-going      SLP SHORT TERM GOAL #4   Title  Pt will utilize external aids for functional recall and schedule management with rare min A over 2 sessions    Time  4    Period  Weeks    Status  On-going       SLP Long Term Goals - 12/10/17 1215      SLP LONG TERM GOAL #1   Title  Pt will alternate attention between 2 simple cognitive linguistic tasks with 80% accuracy on each and occasional min A     Time  8    Period  Weeks    Status  On-going      SLP  LONG TERM GOAL #2   Title  Pt will self correct errors 4/5 errors with occasional min A     Time  8    Period  Weeks    Status  On-going      SLP LONG TERM GOAL #3   Title   Pt will report daily functional and effective use of compensatory strategies for recall of daily activities and appointments    Time  8    Period  Weeks    Status  On-going      SLP LONG TERM GOAL #4   Title  Spouse will demonstrate 2 strategies to improve pt's attending to and follow through of her directions/safety precautions with rare min A over 3 sessions    Time  8    Period  Weeks    Status  On-going       Plan - 12/10/17 1213    Clinical Impression Statement  Pt continues with significant cognitive deficits in attention, executive function, awareness affecting his safety judgement and awareness. Continue skilled ST to maximize safety and reduce caregiver burden.     Speech Therapy Frequency  2x / week    Treatment/Interventions  Cognitive reorganization;Compensatory strategies;Patient/family education;SLP instruction and feedback;Functional tasks;Internal/external aids;Cueing hierarchy    Potential to Achieve Goals  Good    Potential Considerations  Severity of impairments;Ability to learn/carryover information;Previous level of function       Patient will benefit from skilled therapeutic intervention in order to improve the following deficits and impairments:   Cognitive communication deficit    Problem List Patient Active Problem List   Diagnosis Date Noted  . Foul smelling urine   . Leukocytosis   . Hyponatremia   . 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 Benign essential HTN   . Coronary artery disease involving native coronary artery of native heart without angina pectoris   . Stage 3 chronic kidney disease (HCC)   . Acute lower UTI   . Seasonal allergies   . Stroke (HCC) 11/04/2017  . HLD (hyperlipidemia) 11/04/2017  . Chronic diastolic CHF (congestive heart failure) (HCC)  11/04/2017  . Acute renal failure superimposed on stage 2 chronic kidney disease (HCC) 11/04/2017  . Acute metabolic encephalopathy 11/04/2017  . UTI (urinary tract infection) 11/04/2017  . AKI (acute kidney injury) (HCC)   .  History of CVA with residual deficit   . Prediabetes   . Left hemiparesis (HCC) 03/19/2013  . Cerebral infarction (HCC) 03/19/2013  . Left leg weakness 03/19/2013  . CAD, ARTERY BYPASS GRAFT 09/30/2008  . 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 Borderline hyperglycemia 12/03/2007  . Other and unspecified hyperlipidemia 07/28/2007  . Essential hypertension 07/28/2007  . 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 Coronary atherosclerosis 07/28/2007  . BENIGN PROSTATIC HYPERTROPHY 07/28/2007  . COLONIC POLYPS, 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    Lovvorn, Radene Journey MS, CCC-SLP 12/10/2017, 12:16 PM  [facility] [address] [place], [place], [address] Phone: [phone]   Fax:  [phone]   Name: Thomas [name]. MRN: [id number] Date of Birth: February 15, 1941

## 2017-12-10 NOTE — Therapy (Signed)
[facility] [address] [place], [place], [address] Phone: [phone]   Fax:  [phone]  Occupational Therapy Treatment  Patient Details  Name: Thomas [name]. MRN: [id number] Date of Birth: 1940-12-27 Referring Provider: Faith Rogue, MD   Encounter Date: 12/10/2017  OT End of Session - 12/10/17 1232    Visit Number  3    Number of Visits  17    Date for OT Re-Evaluation  01/28/18    Authorization Type  [employer] MCR    OT Start Time  1145    OT Stop Time  1230    OT Time Calculation (min)  45 min    Activity Tolerance  Treatment limited secondary to agitation    Behavior During Therapy  Agitated       Past Medical History:  Diagnosis Date  . CHF (congestive heart failure) (HCC) 1999  . CKD (chronic kidney disease), stage II   . Coronary artery disease   . Hx of colonic polyps   . Hyperlipidemia   . Hyperplastic colon polyp 04/15/2007  . Hypertension   . Prostatic hypertrophy, benign    with elevated PSA  . TIA (transient ischemic attack) 2003   "mini stroke" (03/19/2013)    Past Surgical History:  Procedure Laterality Date  . CARDIAC CATHETERIZATION    . CATARACT EXTRACTION W/ INTRAOCULAR LENS  IMPLANT, BILATERAL Bilateral 1998  . COLONOSCOPY  2008  . CORONARY ARTERY BYPASS GRAFT  1999   x4  . LOOP RECORDER INSERTION N/A 11/05/2017   Procedure: LOOP RECORDER INSERTION;  Surgeon: Hillis Range, MD;  Location: [facility] INVASIVE CV LAB;  Service: Cardiovascular;  Laterality: N/A;  . TEE WITHOUT CARDIOVERSION N/A 11/05/2017   Procedure: TRANSESOPHAGEAL ECHOCARDIOGRAM (TEE) WITH LOOP;  Surgeon: Lars Masson, MD;  Location: [facility] ENDOSCOPY;  Service: Cardiovascular;  Laterality: N/A;    There were no vitals filed for this visit.  Subjective Assessment - 12/10/17 1147    Subjective   I'm trying to look more to my Lt side    Pertinent History  Rt CVA 11/03/17. PMH: HTN, CHF, CKD stage 2, previous CVA 2014  with residual Lt LE weakness    Limitations  LOOP RECORDER, cognitive deficits    Patient Stated Goals  Get back to doing things I used to do    Currently in Pain?  No/denies          Treatment:   See below education provided: reviewed with wife (she came later in treatment session)  Pt asked to copy peg design (using medium sized pegs) with Lt hand for coordination, visual/perceptual skills, scanning and attention to Lt side. Pt required constant cueing to use Lt hand as pt would often switch to Rt hand. Pt still required assist from Rt hand to manipulate peg in Lt hand. Pt also required max cues/assist to copy design correctly (4 diamonds not overlapping) and therapist d/c activity d/t frustration and just had pt place pegs in pegboard near end of task.                  OT Education - 12/10/17 1153    Education provided  Yes    Education Details  Memory strategies, visual scanning strategies    Person(s) Educated  Patient;Spouse    Methods  Explanation;Handout;Verbal cues    Comprehension  Verbalized understanding;Verbal cues required       OT Short Term Goals - 12/10/17 1257      OT SHORT  TERM GOAL #1   Title  Pt/family will verbalize understanding with visual scanning strategies and memory compensatory strategies    Time  4    Period  Weeks    Status  On-going      OT SHORT TERM GOAL #2   Title  Pt/family will verbalize understanding with coordination HEP for bilateral hands    Time  4    Period  Weeks    Status  New      OT SHORT TERM GOAL #3   Title  Pt will perform LE dressing with min assist    Time  4    Period  Weeks    Status  New      OT SHORT TERM GOAL #4   Title  Pt will perform toilet transfer with min assist and clothes management with no more than mod assist    Time  4    Period  Weeks    Status  New      OT SHORT TERM GOAL #5   Title  Pt will attend to Lt side for near body and tabletop scanning with > 75% accuracy    Time  4     Period  Weeks    Status  On-going        OT Long Term Goals - 11/28/17 1301      OT LONG TERM GOAL #1   Title  Independent with updated HEP for UE's    Time  8    Period  Weeks    Status  New      OT LONG TERM GOAL #2   Title  Pt will perform LE dressing with set up/sup only    Time  8    Period  Weeks    Status  New      OT LONG TERM GOAL #3   Title  Pt will perform toilet transfer and tub transfer with close sup only using DME prn    Time  8    Period  Weeks    Status  New      OT LONG TERM GOAL #4   Title  Pt will perform clothes management after toileting mod I level    Time  8    Period  Weeks    Status  New      OT LONG TERM GOAL #5   Title  Pt will perform simple snack prep at mod I level using rollator or tray on walker    Time  8    Period  Weeks    Status  New      Long Term Additional Goals   Additional Long Term Goals  Yes      OT LONG TERM GOAL #6   Title  Pt to perform medication management with direct sup/min cueing prn    Time  8    Period  Weeks    Status  New            Plan - 12/10/17 1257    Clinical Impression Statement  Pt becomes easily frustrated with challenging visual/perceptual and/or cognitive task. Pt also easily agitated with wife. ? dementia as wife reports memory changes prior to stroke.     Occupational Profile and client history currently impacting functional performance  PMH: HTN, CHF, CKD (stage 2), previous Rt CVA 2014, HOH    Occupational performance deficits (Please refer to evaluation for details):  ADL's;IADL's;Leisure;Social Participation    Rehab Potential  Fair    Current Impairments/barriers affecting progress:  severity of deficits including cognition and decreased attention and field cut to Lt side    OT Frequency  2x / week    OT Duration  8 weeks    OT Treatment/Interventions  Self-care/ADL training;DME and/or AE instruction;Moist Heat;Aquatic Therapy;Therapeutic activities;Therapeutic exercise;Cognitive  remediation/compensation;Coping strategies training;Neuromuscular education;Functional Mobility Training;Passive range of motion;Visual/perceptual remediation/compensation;Manual Therapy;Patient/family education    Plan  coordination HEP for bilateral hands (keep to 4 ex's or less), ADLS    Consulted and Agree with Plan of Care  Patient;Family member/caregiver    Family Member Consulted  Wife       Patient will benefit from skilled therapeutic intervention in order to improve the following deficits and impairments:  Decreased coordination, Decreased range of motion, Difficulty walking, Improper body mechanics, Decreased endurance, Decreased safety awareness, Decreased activity tolerance, Decreased knowledge of precautions, Impaired tone, Impaired UE functional use, Decreased knowledge of use of DME, Decreased balance, Decreased cognition, Decreased mobility, Decreased strength, Impaired perceived functional ability, Impaired vision/preception  Visit Diagnosis: Visuospatial deficit  Other symptoms and signs involving cognitive functions following cerebral infarction  Other lack of coordination    Problem List Patient Active Problem List   Diagnosis Date Noted  . Foul smelling urine   . Leukocytosis   . Hyponatremia   . 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 Benign essential HTN   . Coronary artery disease involving native coronary artery of native heart without angina pectoris   . Stage 3 chronic kidney disease (HCC)   . Acute lower UTI   . Seasonal allergies   . Stroke (HCC) 11/04/2017  . HLD (hyperlipidemia) 11/04/2017  . Chronic diastolic CHF (congestive heart failure) (HCC) 11/04/2017  . Acute renal failure superimposed on stage 2 chronic kidney disease (HCC) 11/04/2017  . Acute metabolic encephalopathy 11/04/2017  . UTI (urinary tract infection) 11/04/2017  . AKI (acute kidney injury) (HCC)   . History of CVA with residual deficit   . Prediabetes   . Left hemiparesis  (HCC) 03/19/2013  . Cerebral infarction (HCC) 03/19/2013  . Left leg weakness 03/19/2013  . CAD, ARTERY BYPASS GRAFT 09/30/2008  . 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 Borderline hyperglycemia 12/03/2007  . Other and unspecified hyperlipidemia 07/28/2007  . Essential hypertension 07/28/2007  . 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 Coronary atherosclerosis 07/28/2007  . BENIGN PROSTATIC HYPERTROPHY 07/28/2007  . COLONIC POLYPS, 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    Kelli Churn, OTR/L 12/10/2017, 1:03 PM  [facility] [address] [place], [place], [address] Phone: [phone]   Fax:  [phone]  Name: Thomas [name]. MRN: [id number] Date of Birth: 03-15-1941

## 2017-12-10 NOTE — Patient Instructions (Signed)
Memory Compensation Strategies  1. Use "WARM" strategy.  W= write it down  A= associate it  R= repeat it  M= make a mental note  2.   You can keep a Glass blower/designer.  Use a 3-ring notebook with sections for the following: calendar, important names and phone numbers,  medications, doctors' names/phone numbers, lists/reminders, and a section to journal what you did  each day.   3.    Use a calendar to write appointments down.  4.    Write yourself a schedule for the day.  This can be placed on the calendar or in a separate section of the Memory Notebook.  Keeping a  regular schedule can help memory.  5.    Use medication organizer with sections for each day or morning/evening pills.  You may need help loading it  6.    Keep a basket, or pegboard by the door.  Place items that you need to take out with you in the basket or on the pegboard.  You may also want to  include a message board for reminders.  7.    Use sticky notes.  Place sticky notes with reminders in a place where the task is performed.  For example: " turn off the  stove" placed by the stove, "lock the door" placed on the door at eye level, " take your medications" on  the bathroom mirror or by the place where you normally take your medications.  8.    Use alarms/timers.  Use while cooking to remind yourself to check on food or as a reminder to take your medicine, or as a  reminder to make a call, or as a reminder to perform another task, etc.    VISUAL SCANNING STRATEGIES  1. Look for the edge of objects (to the left and/or right) so that you make sure you are seeing all of an object 2. Turn your head when walking, scan from side to side, particularly in busy environments 3. Use an organized scanning pattern. It's usually easier to scan from top to bottom, and left to right (like you are reading) 4. Double check yourself 5. Use a line guide (like a blank piece of paper) or your finger when reading 6. If necessary,  place brightly colored tape at end of table or work area as a reminder to always look until you see the tape.   Activities to try at home to encourage visual scanning:   1. Word searches 2. Mazes 3. Puzzles 4. Card games 5. Computer games and/or searches 6. Connect-the-dots

## 2017-12-12 ENCOUNTER — Ambulatory Visit: Payer: Medicare Other | Admitting: Adult Health

## 2017-12-12 ENCOUNTER — Encounter: Payer: Self-pay | Admitting: Adult Health

## 2017-12-12 VITALS — BP 120/62 | HR 65

## 2017-12-12 DIAGNOSIS — N3000 Acute cystitis without hematuria: Secondary | ICD-10-CM | POA: Diagnosis not present

## 2017-12-12 DIAGNOSIS — I69393 Ataxia following cerebral infarction: Secondary | ICD-10-CM

## 2017-12-12 DIAGNOSIS — K5901 Slow transit constipation: Secondary | ICD-10-CM

## 2017-12-12 DIAGNOSIS — R350 Frequency of micturition: Secondary | ICD-10-CM

## 2017-12-12 DIAGNOSIS — I63531 Cerebral infarction due to unspecified occlusion or stenosis of right posterior cerebral artery: Secondary | ICD-10-CM

## 2017-12-12 LAB — BASIC METABOLIC PANEL
BUN: 17 mg/dL (ref 6–23)
CALCIUM: 9.3 mg/dL (ref 8.4–10.5)
CO2: 28 mEq/L (ref 19–32)
CREATININE: 1.11 mg/dL (ref 0.40–1.50)
Chloride: 103 mEq/L (ref 96–112)
GFR: 68.39 mL/min (ref >60.00)
Glucose, Bld: 109 mg/dL — ABNORMAL HIGH (ref 70–99)
Potassium: 5.7 mEq/L — ABNORMAL HIGH (ref 3.5–5.1)
Sodium: 138 mEq/L (ref 135–145)

## 2017-12-12 LAB — CBC WITH DIFFERENTIAL/PLATELET
BASOS ABS: 0.1 10*3/uL (ref 0.0–0.1)
Basophils Relative: 0.6 % (ref 0.0–3.0)
EOS ABS: 0.7 10*3/uL (ref 0.0–0.7)
Eosinophils Relative: 7.4 % — ABNORMAL HIGH (ref 0.0–5.0)
HEMATOCRIT: 36 % — AB (ref 39.0–52.0)
HEMOGLOBIN: 12.4 g/dL — AB (ref 13.0–17.0)
Lymphocytes Relative: 21 % (ref 12.0–46.0)
Lymphs Abs: 1.9 10*3/uL (ref 0.7–4.0)
MCHC: 34.6 g/dL (ref 30.0–36.0)
MCV: 92.7 fl (ref 78.0–100.0)
Monocytes Absolute: 1 10*3/uL (ref 0.1–1.0)
Monocytes Relative: 11.6 % (ref 3.0–12.0)
Neutro Abs: 5.3 10*3/uL (ref 1.4–7.7)
Neutrophils Relative %: 59.4 % (ref 43.0–77.0)
Platelets: 374 10*3/uL (ref 150.0–400.0)
RBC: 3.89 Mil/uL — AB (ref 4.22–5.81)
RDW: 14 % (ref 11.5–15.5)
WBC: 8.9 10*3/uL (ref 4.0–10.5)

## 2017-12-12 LAB — POCT URINALYSIS DIPSTICK
Bilirubin, UA: NEGATIVE
Blood, UA: NEGATIVE
Glucose, UA: NEGATIVE
KETONES UA: NEGATIVE
Leukocytes, UA: NEGATIVE
NITRITE UA: NEGATIVE
ODOR: NEGATIVE
PH UA: 7 (ref 5.0–8.0)
Spec Grav, UA: 1.015 (ref 1.010–1.025)
UROBILINOGEN UA: 0.2 U/dL

## 2017-12-12 NOTE — Progress Notes (Signed)
Subjective:    Patient ID: Thomas [name]., male    DOB: October 20, 1940, 77 y.o.   MRN: [id number]  HPI 77 year old male who  has a past medical history of CHF (congestive heart failure) (HCC) (1999), CKD (chronic kidney disease), stage II, Coronary artery disease, colonic polyps, Hyperlipidemia, Hyperplastic colon polyp (04/15/2007), Hypertension, Prostatic hypertrophy, benign, and TIA (transient ischemic attack) (2003).  He presents to the office today for TCM visit.  His wife is with him at this visit  Admit Date: 11/06/2017 & 12/01/2017 Discharge Date: 11/22/2017 & 12/03/2017  Was admitted in March 2019 for acute right PCA stroke.  Patient had a UTI while he was in the hospital.  This UTI was resistant cysts ceftriaxone but sensitive to quinolones as well as imipenem and nitrofurantoin.  Patient was given nitrofurantoin for 5 days which he completed.  He presented to the emergency room on 421 unable to urinate.  In the ER his heart rate was 123, WBC 13.1, take acid 1.23.  Urinalysis noted white blood cells too numerous to count with many bacteria.   In the ER he was started on IV Rocephin but due to previous organism being resistant to Rocephin and any team switched him to IV Cipro.  He was discharged on 4 additional days of p.o. Cipro  As for the hospital visit related to right PCA stroke.  He was admitted on 11/03/2017 with confusion, difficulty walking and visual changes.  CT of the head was suggestive of a right PCA infarct. 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 TEE showed EF 55 to 60% with Lipo Matus hypertrophy of atrial septum and no thrombus noted.  He had a loop recorder during this admission.  Bilateral lower extremity Dopplers were negative for DVT.  Admitted to rehab on 11/06/2017 for inpatient therapies of PT, ST, and OT at least 3 hours x 5  days a week.  Per hospital note he made good progress during his rehab stay and he has continued outpatient PT, OT and speech therapy.  Today in the office he reports that he is feeling well overall.  He feels as though he is making progress going various outpatient therapies.  His wife reports that he often gets frustrated at therapies, especially with fine motor exercises. At home his wife is constantly supervising him.  She is standing by his side as he does most of his activities of daily living.  She does report one fall at home where he had a trip and fall when he was not using his walker.  He had no loss of consciousness or hit his head during this fall.  Only injury was an abrasion to the left elbow.  Since that time he has had no falls and he has been using his walker whenever he ambulates.  He has finished cipro about 3-4 days ago and is having no urinary tract infection symptoms except for urinary frequency.  His wife reports that since being discharged from the hospital he has been urinating more frequently last night got up about 7 times throughout the evening to urinate.  Is noted that during last hospital admission he was started on Flomax to be taken in the evening.  Patient's other concern is that of left-sided abdominal pain that started approximately 24 hours ago.  His wife reports that every time he got up to use  the restroom last night he would complain of discomfort in his right lower quadrant.  He does report being constipated and has not has not had a bowel movement in approximately 3 to 4 days.  His wife started him on MiraLAX recently due to constipation       Review of Systems  Constitutional: Positive for activity change.  Eyes: Negative.   Respiratory: Negative.   Cardiovascular: Negative.   Gastrointestinal: Positive for abdominal pain and constipation. Negative for diarrhea, nausea and vomiting.  Endocrine: Negative.   Genitourinary: Positive for frequency. Negative  for decreased urine volume, difficulty urinating, dysuria, flank pain and urgency.  Musculoskeletal: Positive for gait problem.  Skin: Negative.   Allergic/Immunologic: Negative.   Neurological: Positive for weakness. Negative for dizziness, tremors, facial asymmetry, light-headedness and numbness.  Hematological: Negative.   Psychiatric/Behavioral: Negative.   All other systems reviewed and are negative.  Past Medical History:  Diagnosis Date  . CHF (congestive heart failure) (HCC) 1999  . CKD (chronic kidney disease), stage II   . Coronary artery disease   . Hx of colonic polyps   . Hyperlipidemia   . Hyperplastic colon polyp 04/15/2007  . Hypertension   . Prostatic hypertrophy, benign    with elevated PSA  . TIA (transient ischemic attack) 2003   "mini stroke" (03/19/2013)    Social History   Socioeconomic History  . Marital status: Married    Spouse name: cathy  . Number of children: 2  . Years of education: college  . Highest education level: Not on file  Occupational History  . Occupation: retired  Engineer, production  . Financial resource strain: Not on file  . Food insecurity:    Worry: Not on file    Inability: Not on file  . Transportation needs:    Medical: Not on file    Non-medical: Not on file  Tobacco Use  . Smoking status: Former Smoker    Packs/day: 0.75    Years: 50.00    Pack years: 37.50    Types: Cigarettes    Last attempt to quit: 03/19/2013    Years since quitting: 4.7  . Smokeless tobacco: Never Used  Substance and Sexual Activity  . Alcohol use: Yes    Alcohol/week: 0.6 oz    Types: 1 Cans of beer per week    Comment: 03/19/2013 "1 beer/week" does not drink anymore  . Drug use: No  . Sexual activity: Not Currently  Lifestyle  . Physical activity:    Days per week: Not on file    Minutes per session: Not on file  . Stress: Not on file  Relationships  . Social connections:    Talks on phone: Not on file    Gets together: Not on file     Attends religious service: Not on file    Active member of club or organization: Not on file    Attends meetings of clubs or organizations: Not on file    Relationship status: Not on file  . Intimate partner violence:    Fear of current or ex partner: Not on file    Emotionally abused: Not on file    Physically abused: Not on file    Forced sexual activity: Not on file  Other Topics Concern  . Not on file  Social History Narrative   Patient lives at home with wife [name])   Retired.   Education one year of college.   Right handed.   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  Past Surgical History:  Procedure Laterality Date  . CARDIAC CATHETERIZATION    . CATARACT EXTRACTION W/ INTRAOCULAR LENS  IMPLANT, BILATERAL Bilateral 1998  . COLONOSCOPY  2008  . CORONARY ARTERY BYPASS GRAFT  1999   x4  . LOOP RECORDER INSERTION N/A 11/05/2017   Procedure: LOOP RECORDER INSERTION;  Surgeon: Hillis Range, MD;  Location: [facility] INVASIVE CV LAB;  Service: Cardiovascular;  Laterality: N/A;  . TEE WITHOUT CARDIOVERSION N/A 11/05/2017   Procedure: TRANSESOPHAGEAL ECHOCARDIOGRAM (TEE) WITH LOOP;  Surgeon: Lars Masson, MD;  Location: [facility] ENDOSCOPY;  Service: Cardiovascular;  Laterality: N/A;    Family History  Problem Relation Age of Onset  . Coronary artery disease Mother   . Stroke Father     Allergies  Allergen Reactions  . Sulfonamide Derivatives     REACTION: Weak,lethargic    Current Outpatient Medications on File Prior to Visit  Medication Sig Dispense Refill  . acetaminophen (TYLENOL) 325 MG tablet Take 1-2 tablets (325-650 mg total) by mouth every 4 (four) hours as needed for mild pain. (Patient not taking: Reported on 12/04/2017)    . aspirin EC 325 MG EC tablet Take 1 tablet (325 mg total) by mouth daily. 30 tablet 0  . atorvastatin (LIPITOR) 80 MG tablet Take 1 tablet (80 mg total) by mouth daily. (Patient taking differently: Take 80 mg by mouth daily at 6 PM. )    . bethanechol  (URECHOLINE) 10 MG tablet Take 1 tablet (10 mg total) by mouth 4 (four) times daily. 120 tablet 0  . ciprofloxacin (CIPRO) 500 MG tablet Take 1 tablet (500 mg total) by mouth 2 (two) times daily. 8 tablet 0  . clopidogrel (PLAVIX) 75 MG tablet TAKE 1 TABLET BY MOUTH  DAILY WITH BREAKFAST (Patient taking differently: TAKE 1 TABLET (75mg ) BY MOUTH  DAILY WITH BREAKFAST) 90 tablet 3  . Flax OIL Take 1 capsule by mouth daily.      . fluconazole (DIFLUCAN) 100 MG tablet Take 1 tablet (100 mg total) by mouth 2 (two) times daily. 60 tablet 0  . fluticasone (FLONASE) 50 MCG/ACT nasal spray Place 2 sprays into both nostrils daily as needed for allergies or rhinitis.  2  . folic acid (FOLVITE) 1 MG tablet TAKE 1 TABLET EVERY DAY (Patient taking differently: TAKE 1 TABLET (1mg ) by mouth EVERY DAY) 90 tablet 3  . hydrocerin (EUCERIN) CREA Apply 1 application topically 2 (two) times daily.  0  . ketoconazole (NIZORAL) 2 % cream Apply 1 application topically 2 (two) times daily. 60 g 1  . loratadine (CLARITIN) 10 MG tablet Take 10 mg by mouth at bedtime.     . metoprolol tartrate (LOPRESSOR) 50 MG tablet TAKE 1 TABLET BY MOUTH TWO  TIMES DAILY (Patient taking differently: TAKE 1 TABLET (50mg ) BY MOUTH TWO  TIMES DAILY) 180 tablet 3  . Omega-3 Fatty Acids (FISH OIL) 1000 MG CAPS Take 1 capsule by mouth daily.      . tamsulosin (FLOMAX) 0.4 MG CAPS capsule Take 1 capsule (0.4 mg total) by mouth daily after supper. 30 capsule 0   No current facility-administered medications on file prior to visit.     BP 120/62   Pulse 65   SpO2 95%       Objective:   Physical Exam  Constitutional: He is oriented to person, place, and time. He appears well-developed and well-nourished. No distress.  Eyes: Pupils are equal, round, and reactive to light. Conjunctivae and EOM are normal.  Loss of  peripheral vision on the left  Neck: Normal range of motion. Neck supple.  Cardiovascular: Normal rate, regular rhythm, normal  heart sounds and intact distal pulses. Exam reveals no gallop and no friction rub.  No murmur heard. Pulmonary/Chest: Effort normal and breath sounds normal. No stridor. No respiratory distress. He has no wheezes. He has no rales. He exhibits no tenderness.  Abdominal: Soft. Bowel sounds are normal. He exhibits no distension and no mass. There is tenderness. There is no rebound and no guarding. No hernia.  No tenderness on exam.  He does have a large stool burden that is felt in left lower quadrant.  Musculoskeletal: He exhibits no edema, tenderness or deformity.  Walks with slight right-sided deviation using his walker.  Does have slow steady gait  Neurological: He is alert and oriented to person, place, and time. No cranial nerve deficit or sensory deficit. He exhibits abnormal muscle tone. Coordination and gait abnormal.  Left sided abnormal coordination with finger-to-nose test  Upper Extremities - grip strength  - 5/5 - right  - 4/5 - left   Upper Extremities - Resistance  - 5/5 - right  - 4/5 - left   Lower Extremities - Extension  5/5 - right  5/5 - left   Lower Extremities - Flexion  5/5- right  4/5- left.   Skin: He is not diaphoretic.  Nursing note and vitals reviewed.     Assessment & Plan:  1. Acute right PCA stroke [facility]) -Reviewed labs and imaging from this hospital admission.  Seems to be progressing quite well.  Continue with current plan of care, continue with therapy outpatient.  Will await neurology visit in proximately 2 weeks.  -Return precautions reviewed - Basic metabolic panel - CBC with Differential/Platelet  2. Acute cystitis without hematuria -Seems to be resolved. -Have reviewed imaging and labs done during his hospital admission - Basic metabolic panel - CBC with Differential/Platelet - POC Urinalysis Dipstick  3. Slow transit constipation -Advised to increase green leafy vegetables and fruits high in fiber such as apples or pears.  Can use  over-the-counter stool softener for 7 days.  Follow-up if no improvement  4. Urinary frequency -[place] due to starting Flomax.  We will have them switch from taking in the p.m. to taking in the a.m. - POC Urinalysis Dipstick   Shirline Frees, NP

## 2017-12-13 ENCOUNTER — Ambulatory Visit: Payer: Medicare Other | Admitting: Speech Pathology

## 2017-12-13 ENCOUNTER — Encounter: Payer: Self-pay | Admitting: Speech Pathology

## 2017-12-13 ENCOUNTER — Ambulatory Visit
Payer: Medicare Other | Attending: Physical Medicine & Rehabilitation | Admitting: Rehabilitative and Restorative Service Providers"

## 2017-12-13 ENCOUNTER — Ambulatory Visit: Payer: Medicare Other | Admitting: Occupational Therapy

## 2017-12-13 ENCOUNTER — Encounter: Payer: Self-pay | Admitting: Rehabilitative and Restorative Service Providers"

## 2017-12-13 DIAGNOSIS — R2681 Unsteadiness on feet: Secondary | ICD-10-CM

## 2017-12-13 DIAGNOSIS — R2689 Other abnormalities of gait and mobility: Secondary | ICD-10-CM | POA: Diagnosis present

## 2017-12-13 DIAGNOSIS — I69354 Hemiplegia and hemiparesis following cerebral infarction affecting left non-dominant side: Secondary | ICD-10-CM | POA: Insufficient documentation

## 2017-12-13 DIAGNOSIS — R278 Other lack of coordination: Secondary | ICD-10-CM | POA: Insufficient documentation

## 2017-12-13 DIAGNOSIS — R41841 Cognitive communication deficit: Secondary | ICD-10-CM

## 2017-12-13 DIAGNOSIS — I69318 Other symptoms and signs involving cognitive functions following cerebral infarction: Secondary | ICD-10-CM | POA: Insufficient documentation

## 2017-12-13 DIAGNOSIS — M6281 Muscle weakness (generalized): Secondary | ICD-10-CM | POA: Diagnosis present

## 2017-12-13 DIAGNOSIS — R41842 Visuospatial deficit: Secondary | ICD-10-CM | POA: Insufficient documentation

## 2017-12-13 DIAGNOSIS — R293 Abnormal posture: Secondary | ICD-10-CM | POA: Insufficient documentation

## 2017-12-13 NOTE — Therapy (Signed)
[facility] [address] [place], [place], [address] Phone: [phone]   Fax:  [phone]  Occupational Therapy Treatment  Patient Details  Name: Thomas [name]. MRN: [id number] Date of Birth: June 27, 1941 Referring Provider: Faith Rogue, MD   Encounter Date: 12/13/2017  OT End of Session - 12/13/17 1025    Visit Number  4    Number of Visits  17    Date for OT Re-Evaluation  01/28/18    Authorization Type  [employer] MCR    OT Start Time  0845    OT Stop Time  0933    OT Time Calculation (min)  48 min    Activity Tolerance  Patient tolerated treatment well    Behavior During Therapy  [facility] for tasks assessed/performed       Past Medical History:  Diagnosis Date  . CHF (congestive heart failure) (HCC) 1999  . CKD (chronic kidney disease), stage II   . Coronary artery disease   . Hx of colonic polyps   . Hyperlipidemia   . Hyperplastic colon polyp 04/15/2007  . Hypertension   . Prostatic hypertrophy, benign    with elevated PSA  . TIA (transient ischemic attack) 2003   "mini stroke" (03/19/2013)    Past Surgical History:  Procedure Laterality Date  . CARDIAC CATHETERIZATION    . CATARACT EXTRACTION W/ INTRAOCULAR LENS  IMPLANT, BILATERAL Bilateral 1998  . COLONOSCOPY  2008  . CORONARY ARTERY BYPASS GRAFT  1999   x4  . LOOP RECORDER INSERTION N/A 11/05/2017   Procedure: LOOP RECORDER INSERTION;  Surgeon: Hillis Range, MD;  Location: [facility] INVASIVE CV LAB;  Service: Cardiovascular;  Laterality: N/A;  . TEE WITHOUT CARDIOVERSION N/A 11/05/2017   Procedure: TRANSESOPHAGEAL ECHOCARDIOGRAM (TEE) WITH LOOP;  Surgeon: Lars Masson, MD;  Location: [facility] ENDOSCOPY;  Service: Cardiovascular;  Laterality: N/A;    There were no vitals filed for this visit.  Subjective Assessment - 12/13/17 0859    Subjective   Wife reports he is very reluctant to try something new    Pertinent History  Rt CVA 11/03/17. 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  No/denies        TREATMENT:   Pt/wife shown options for tying shoes: including elastic shoe laces, spyrolaces, velcro converters, and shoe buttons. Therapist demo shoe buttons and had pt practice donning/doffing shoe with shoe button. Pt easily frustrated trying to get lace off of shoe button but could do relatively easy with practice and then able to don back around button w/o difficulty. Also gave option of supportive slip on shoes. Discussed donning socks as wife is currently doing, and possible option of sock aide if wife thought pt would be open to learning the use of a new device (sock aide). Can practice at next session. Pt's wife reports pt can usually don shirt, but sometimes gets it turned around. Discussed safety w/ donning pants and always having supervision with standing d/t LOB. Will also practice or simulate next session Also discussed use of LH sponge for pt to increase independence with bathing (for back and feet).  Reinforced continued practice of visual scanning strategies and memory strategies  Issued coordination HEP - pt demo each bilaterally with min difficulty Rt hand and mod  to max difficulty Lt hand and mod cueing to use Lt hand.                    OT Education - 12/13/17 0908    Education provided  Yes    Education Details  Coordination HEP    Person(s) Educated  Patient;Spouse    Methods  Explanation;Demonstration;Handout    Comprehension  Verbalized understanding;Returned demonstration       OT Short Term Goals - 12/13/17 1026      OT SHORT TERM GOAL #1   Title  Pt/family will verbalize understanding with visual scanning strategies and memory compensatory strategies    Time  4    Period  Weeks    Status  On-going      OT SHORT TERM GOAL #2    Title  Pt/family will verbalize understanding with coordination HEP for bilateral hands    Time  4    Period  Weeks    Status  On-going      OT SHORT TERM GOAL #3   Title  Pt will perform LE dressing with min assist    Time  4    Period  Weeks    Status  On-going      OT SHORT TERM GOAL #4   Title  Pt will perform toilet transfer with min assist and clothes management with no more than mod assist    Time  4    Period  Weeks    Status  New      OT SHORT TERM GOAL #5   Title  Pt will attend to Lt side for near body and tabletop scanning with > 75% accuracy    Time  4    Period  Weeks    Status  On-going        OT Long Term Goals - 11/28/17 1301      OT LONG TERM GOAL #1   Title  Independent with updated HEP for UE's    Time  8    Period  Weeks    Status  New      OT LONG TERM GOAL #2   Title  Pt will perform LE dressing with set up/sup only    Time  8    Period  Weeks    Status  New      OT LONG TERM GOAL #3   Title  Pt will perform toilet transfer and tub transfer with close sup only using DME prn    Time  8    Period  Weeks    Status  New      OT LONG TERM GOAL #4   Title  Pt will perform clothes management after toileting mod I level    Time  8    Period  Weeks    Status  New      OT LONG TERM GOAL #5   Title  Pt will perform simple snack prep at mod I level using rollator or tray on walker    Time  8    Period  Weeks    Status  New      Long Term Additional Goals   Additional Long Term Goals  Yes      OT LONG TERM GOAL #6   Title  Pt to perform medication management with direct sup/min cueing prn    Time  8    Period  Weeks    Status  New  Plan - 12/13/17 1026    Clinical Impression Statement  Pt able to perform coordination HEP with encouragement today, but reminders to use Lt hand. Pt/family encouraged to practice HEP with both hands d/t slowness in both, but to emphasize practicing more with Lt hand.     Occupational Profile  and client history currently impacting functional performance  PMH: HTN, CHF, CKD (stage 2), previous Rt CVA 2014, HOH    Occupational performance deficits (Please refer to evaluation for details):  ADL's;IADL's;Leisure;Social Participation    Rehab Potential  Fair    Current Impairments/barriers affecting progress:  severity of deficits including cognition and decreased attention and field cut to Lt side    OT Frequency  2x / week    OT Duration  8 weeks    OT Treatment/Interventions  Self-care/ADL training;DME and/or AE instruction;Moist Heat;Aquatic Therapy;Therapeutic activities;Therapeutic exercise;Cognitive remediation/compensation;Coping strategies training;Neuromuscular education;Functional Mobility Training;Passive range of motion;Visual/perceptual remediation/compensation;Manual Therapy;Patient/family education    Plan  standing balance w/ ADLS (also simulate donning/doffing pants), visual scanning tasks (tabeltop and w/ walker negotiation in busy environment), ? Practice use of sock aide       Patient will benefit from skilled therapeutic intervention in order to improve the following deficits and impairments:  Decreased coordination, Decreased range of motion, Difficulty walking, Improper body mechanics, Decreased endurance, Decreased safety awareness, Decreased activity tolerance, Decreased knowledge of precautions, Impaired tone, Impaired UE functional use, Decreased knowledge of use of DME, Decreased balance, Decreased cognition, Decreased mobility, Decreased strength, Impaired perceived functional ability, Impaired vision/preception  Visit Diagnosis: Other lack of coordination  Visuospatial deficit  Unsteadiness on feet  Hemiplegia and hemiparesis following cerebral infarction affecting left non-dominant side [facility])    Problem List Patient Active Problem List   Diagnosis Date Noted  . Foul smelling urine   . Leukocytosis   . Hyponatremia   . 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 Benign essential HTN   . Coronary artery disease involving native coronary artery of native heart without angina pectoris   . Stage 3 chronic kidney disease (HCC)   . Acute lower UTI   . Seasonal allergies   . Stroke (HCC) 11/04/2017  . HLD (hyperlipidemia) 11/04/2017  . Chronic diastolic CHF (congestive heart failure) (HCC) 11/04/2017  . Acute renal failure superimposed on stage 2 chronic kidney disease (HCC) 11/04/2017  . Acute metabolic encephalopathy 11/04/2017  . UTI (urinary tract infection) 11/04/2017  . AKI (acute kidney injury) (HCC)   . History of CVA with residual deficit   . Prediabetes   . Left hemiparesis (HCC) 03/19/2013  . Cerebral infarction (HCC) 03/19/2013  . Left leg weakness 03/19/2013  . CAD, ARTERY BYPASS GRAFT 09/30/2008  . 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 Borderline hyperglycemia 12/03/2007  . Other and unspecified hyperlipidemia 07/28/2007  . Essential hypertension 07/28/2007  . 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 Coronary atherosclerosis 07/28/2007  . BENIGN PROSTATIC HYPERTROPHY 07/28/2007  . COLONIC POLYPS, 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    Kelli Churn, OTR/L 12/13/2017, 10:29 AM  [facility] [address] [place], [place], [address] Phone: [phone]   Fax:  [phone]  Name: Thomas [name]. MRN: [id number] Date of Birth: 06-22-1941

## 2017-12-13 NOTE — Patient Instructions (Signed)
  Coordination Activities  Perform the following activities for 15 minutes 1-2 times per day with both hand(s).   Rotate ball in fingertips (clockwise and counter-clockwise).  Flip cards 1 at a time as fast as you can.  Deal cards with your thumb (Hold deck in hand and push card off top with thumb).  Pick up coins and stack.

## 2017-12-13 NOTE — Therapy (Signed)
[facility] [address] [place], [place], [address] Phone: [phone]   Fax:  [phone]  Physical Therapy Treatment  Patient Details  Name: Thomas [name]. MRN: [id number] Date of Birth: 04-06-1941 Referring Provider: Faith Rogue, MD   Encounter Date: 12/13/2017  PT End of Session - 12/13/17 1014    Visit Number  3    Number of Visits  17 eval + 2x/week for 8 weeks    Date for PT Re-Evaluation  01/27/18    Authorization Type  [employer]HC medicare     PT Start Time  [phone]    PT Stop Time  1018    PT Time Calculation (min)  40 min    Equipment Utilized During Treatment  Gait belt    Activity Tolerance  Patient tolerated treatment well    Behavior During Therapy  WFL for tasks assessed/performed       Past Medical History:  Diagnosis Date  . CHF (congestive heart failure) (HCC) 1999  . CKD (chronic kidney disease), stage II   . Coronary artery disease   . Hx of colonic polyps   . Hyperlipidemia   . Hyperplastic colon polyp 04/15/2007  . Hypertension   . Prostatic hypertrophy, benign    with elevated PSA  . TIA (transient ischemic attack) 2003   "mini stroke" (03/19/2013)    Past Surgical History:  Procedure Laterality Date  . CARDIAC CATHETERIZATION    . CATARACT EXTRACTION W/ INTRAOCULAR LENS  IMPLANT, BILATERAL Bilateral 1998  . COLONOSCOPY  2008  . CORONARY ARTERY BYPASS GRAFT  1999   x4  . LOOP RECORDER INSERTION N/A 11/05/2017   Procedure: LOOP RECORDER INSERTION;  Surgeon: Hillis Range, MD;  Location: [facility] INVASIVE CV LAB;  Service: Cardiovascular;  Laterality: N/A;  . TEE WITHOUT CARDIOVERSION N/A 11/05/2017   Procedure: TRANSESOPHAGEAL ECHOCARDIOGRAM (TEE) WITH LOOP;  Surgeon: Lars Masson, MD;  Location: [facility] ENDOSCOPY;  Service: Cardiovascular;  Laterality: N/A;    There were no vitals filed for this visit.  Subjective Assessment - 12/13/17 0942    Subjective  The patient and wife report no  falls this week.  They walked in using RW instead of w/c.     Patient Stated Goals  Moving "a whole lot easier than I am now."  Wife notes she wants him to get stronger and get back to where he can have a little more independence.    Currently in Pain?  No/denies         [facility] PT Assessment - 12/13/17 0947      Ambulation/Gait   Ambulation Distance (Feet)  115 Feet x 2 reps                   [facility] Adult PT Treatment/Exercise - 12/13/17 0947      Ambulation/Gait   Ambulation/Gait  Yes    Ambulation/Gait Assistance  4: Min assist    Ambulation/Gait Assistance Details  Gait activities emphasizing visual scanning, staying within walker, and moving L foot laterally (it comes to midline possibly due to hip weakness).  PT provided frequent postural verbal and tactile cues.    Assistive device  Rolling walker    Gait Pattern  Step-through pattern;Decreased step length - right;Decreased step length - left;Left genu recurvatum;Decreased trunk rotation;Decreased weight shift to left    Ambulation Surface  Level;Indoor      Exercises   Exercises  Other Exercises    Other Exercises   Warming up seated  raising bilat UEs overhead x 8 reps and then abducting encouraging core lengthening.  Core/trunk rotation seated x 5 reps reaching across midline bilaterally.  Sit<>stand x 10 reps with CGA and tactile cues for postural upright.STANDING:  squats with min A, standing heel cord stretch, standing heel raises x 5 reps and standing toe raises x 5 reps with cues frequently for correct technique and CGA to min A.    SUPINE:  bridges x 5 reps, bridges with hip adduction x 5 reps, sidelying clamshells x 5 reps with assist and hip abduction attempted sidelying x 5 reps with min A.               PT Short Term Goals - 11/28/17 1118      PT SHORT TERM GOAL #1   Title  The patient will be indep with HEP for LE strengthening, posture, stretching and general mobility.    Time  4    Period   Weeks    Target Date  12/28/17      PT SHORT TERM GOAL #2   Title  The patient will improve Berg balance score from 7/56 to > or equal to 16/56 to demonstrate improving independence for standing balance and transfers.    Time  4    Period  Weeks    Target Date  12/28/17      PT SHORT TERM GOAL #3   Title  The patient will move sit<>stand with UE support 5/5 trials mod indep to RW.    Time  4    Period  Weeks    Target Date  12/28/17      PT SHORT TERM GOAL #4   Title  The patient will be further assessed on gait speed and LTG to follow.    Time  4    Period  Weeks    Target Date  12/28/17      PT SHORT TERM GOAL #5   Title  The patient will ambulate x 200 ft nonstop with RW and CGA to demonstrate improving endurance and dec'd caregiver assist.    Time  4    Period  Weeks    Target Date  12/28/17        PT Long Term Goals - 11/28/17 1121      PT LONG TERM GOAL #1   Title  The patient will improve functional status score from 21% to > or equal to 35% to demo improved perception of functional abiltiies.    Time  8    Period  Weeks    Target Date  01/27/18      PT LONG TERM GOAL #2   Title  The patient will improve gait speed *baseline to be established after eval.    Time  8    Period  Weeks    Target Date  01/27/18      PT LONG TERM GOAL #3   Title  The patient will improve Berg score from 7/56 to > or equal to 22/56 to demo improving standing balance for ADLs/functional tasks.    Time  8    Period  Weeks    Target Date  01/27/18      PT LONG TERM GOAL #4   Title  The patient will negotiate 4 steps with one HR with supervision for safe entrance/exit from home.    Time  8    Period  Weeks    Target Date  01/27/18  PT LONG TERM GOAL #5   Title  The patient will ambulate x 350 ft with RW mod indep for improved household and short distance community negotiation to reduce caregiver assist.    Time  8    Period  Weeks    Target Date  01/27/18      Additional  Long Term Goals   Additional Long Term Goals  Yes      PT LONG TERM GOAL #6   Title  The patient will move floor<>stand with UE support and min A due to h/o falls.    Time  8    Period  Weeks    Target Date  01/27/18            Plan - 12/13/17 1351    Clinical Impression Statement  The patient's wife waited in lobby after PT session began today and PT inquired about rib pain at end of session- patient painful with bed mobility L ribs.  She is unaware of injury during a fall and thinks it is more related to bowel issues.  Patient showing improved sit<>stand and tolerated walking with assist in clinic well today.  Patient continues to need regular cues for safety in standing/walking.  Continue working to [employer].     PT Treatment/Interventions  ADLs/Self Care Home Management;Gait training;Stair training;Functional mobility training;Therapeutic activities;Therapeutic exercise;Neuromuscular re-education;Balance training;Patient/family education;Orthotic Fit/Training;Manual techniques    PT Next Visit Plan  check HEP, seated postural strengthening, standing balance in clinic, gait training emphasizing endurance and L visual scanning to avoid obstacles    Consulted and Agree with Plan of Care  Patient       Patient will benefit from skilled therapeutic intervention in order to improve the following deficits and impairments:  Abnormal gait, Decreased endurance, Decreased activity tolerance, Decreased strength, Decreased safety awareness, Impaired flexibility, Postural dysfunction, Decreased balance, Decreased mobility  Visit Diagnosis: Other symptoms and signs involving cognitive functions following cerebral infarction  Muscle weakness (generalized)  Abnormal posture  Other abnormalities of gait and mobility     Problem List Patient Active Problem List   Diagnosis Date Noted  . Foul smelling urine   . Leukocytosis   . Hyponatremia   . 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  Benign essential HTN   . Coronary artery disease involving native coronary artery of native heart without angina pectoris   . Stage 3 chronic kidney disease (HCC)   . Acute lower UTI   . Seasonal allergies   . Stroke (HCC) 11/04/2017  . HLD (hyperlipidemia) 11/04/2017  . Chronic diastolic CHF (congestive heart failure) (HCC) 11/04/2017  . Acute renal failure superimposed on stage 2 chronic kidney disease (HCC) 11/04/2017  . Acute metabolic encephalopathy 11/04/2017  . UTI (urinary tract infection) 11/04/2017  . AKI (acute kidney injury) (HCC)   . History of CVA with residual deficit   . Prediabetes   . Left hemiparesis (HCC) 03/19/2013  . Cerebral infarction (HCC) 03/19/2013  . Left leg weakness 03/19/2013  . CAD, ARTERY BYPASS GRAFT 09/30/2008  . 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 Borderline hyperglycemia 12/03/2007  . Other and unspecified hyperlipidemia 07/28/2007  . Essential hypertension 07/28/2007  . 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 Coronary atherosclerosis 07/28/2007  . BENIGN PROSTATIC HYPERTROPHY 07/28/2007  . COLONIC POLYPS, 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    Adelai Achey, PT 12/13/2017, 1:53 PM  [facility] [address] [place], [place], [address] Phone: [phone]   Fax:  [phone]  Name: Vander  D Malva Limes. MRN: [id number] Date of Birth: 04/11/1941

## 2017-12-13 NOTE — Therapy (Signed)
[facility] [address] [place], [place], [address] Phone: [phone]   Fax:  [phone]  Speech Language Pathology Treatment  Patient Details  Name: Thomas [name]. MRN: [id number] Date of Birth: [birth year] Referring Provider: Ranelle Oyster, MD   Encounter Date: 12/13/2017  End of Session - 12/13/17 1303    Visit Number  3    SLP Start Time  1020    SLP Stop Time   1101    SLP Time Calculation (min)  41 min    Activity Tolerance  Patient tolerated treatment well       Past Medical History:  Diagnosis Date  . CHF (congestive heart failure) (HCC) 1999  . CKD (chronic kidney disease), stage II   . Coronary artery disease   . Hx of colonic polyps   . Hyperlipidemia   . Hyperplastic colon polyp 04/15/2007  . Hypertension   . Prostatic hypertrophy, benign    with elevated PSA  . TIA (transient ischemic attack) 2003   "mini stroke" (03/19/2013)    Past Surgical History:  Procedure Laterality Date  . CARDIAC CATHETERIZATION    . CATARACT EXTRACTION W/ INTRAOCULAR LENS  IMPLANT, BILATERAL Bilateral 1998  . COLONOSCOPY  2008  . CORONARY ARTERY BYPASS GRAFT  1999   x4  . LOOP RECORDER INSERTION N/A 11/05/2017   Procedure: LOOP RECORDER INSERTION;  Surgeon: Hillis Range, MD;  [facility]: [facility] INVAS[facility] CV LAB;  Service: Cardiovascular;  Laterality: N/A;  . TEE WITHOUT CARDIOVERSION N/A 11/05/2017   Procedure: TRANSESOPHAGEAL ECHOCARDIOGRAM (TEE) WITH LOOP;  Surgeon: Lars Masson, MD;  Location: [facility] ENDOSCOPY;  Service: Cardiovascular;  Laterality: N/A;    There were no vitals filed for this visit.  Subjective Assessment - 12/13/17 1259    Subjective  "We played WAR yesterday - he got upset because I kept winning"            ADULT SLP TREATMENT - 12/13/17 1259      General Information   Behavior/Cognition  Alert;Cooperative;Pleasant mood      Treatment Provided   Treatment provided   Cognitive-Linquistic      Cognitive-Linquistic Treatment   Treatment focused on  Cognition    Skilled Treatment  Pt with min questioning cues to recall last session. Selective attention for 20 minutes in simple cognitive linguistic task with 4 re-directions to task with mild distractions. Pt noted to internally distract or loose focus. Simple clock reading/functional time reasoning/math with extended time, 90% accuracy and occasional min A      Assessment / Recommendations / Plan   Plan  Continue with current plan of care      Progression Toward Goals   Progression toward goals  Progressing toward goals         SLP Short Term Goals - 12/13/17 1303      SLP SHORT TERM GOAL #1   Title  Pt will attend to simple cognitive linguistic tasks in mildly distracting environment for 10 minutes over 2 sessions     Baseline  12/14/17;     Time  4    Period  Weeks    Status  On-going      SLP SHORT TERM GOAL #2   Title  Pt will ID and correct 3/5 errors on simple cognitive linguistic tasks with occasional min A over 2 sessions.     Time  4    Period  Weeks      SLP SHORT TERM GOAL #3  Title  Pt will complete mildy complex organization, reasoning, problem solving tasks with 85% accuracy and occasional min A    Time  4    Period  Weeks    Status  On-going      SLP SHORT TERM GOAL #4   Title  Pt will utilize external aids for functional recall and schedule management with rare min A over 2 sessions    Time  4    Period  Weeks    Status  On-going       SLP Long Term Goals - 12/13/17 1303      SLP LONG TERM GOAL #1   Title  Pt will alternate attention between 2 simple cognitive linguistic tasks with 80% accuracy on each and occasional min A     Time  8    Period  Weeks    Status  On-going      SLP LONG TERM GOAL #2   Title  Pt will self correct errors 4/5 errors with occasional min A     Time  8    Period  Weeks    Status  On-going      SLP LONG TERM GOAL #3   Title   Pt will  report daily functional and effective use of compensatory strategies for recall of daily activities and appointments    Time  8    Period  Weeks    Status  On-going      SLP LONG TERM GOAL #4   Title  Spouse will demonstrate 2 strategies to improve pt's attending to and follow through of her directions/safety precautions with rare min A over 3 sessions    Time  8    Period  Weeks    Status  On-going       Plan - 12/13/17 1302    Clinical Impression Statement  Pt continues with significant cognitive deficits in attention, executive function, awareness affecting his safety judgement and awareness. Continue skilled ST to maximize safety and reduce caregiver burden.     Speech Therapy Frequency  2x / week    Treatment/Interventions  Cognitive reorganization;Compensatory strategies;Patient/family education;SLP instruction and feedback;Functional tasks;Internal/external aids;Cueing hierarchy    Potential to Achieve Goals  Fair    Potential Considerations  Severity of impairments;Ability to learn/carryover information;Previous level of function       Patient will benefit from skilled therapeutic intervention in order to improve the following deficits and impairments:   Cognitive communication deficit    Problem List Patient Active Problem List   Diagnosis Date Noted  . Foul smelling urine   . Leukocytosis   . Hyponatremia   . 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 Benign essential HTN   . Coronary artery disease involving native coronary artery of native heart without angina pectoris   . Stage 3 chronic kidney disease (HCC)   . Acute lower UTI   . Seasonal allergies   . Stroke (HCC) 11/04/2017  . HLD (hyperlipidemia) 11/04/2017  . Chronic diastolic CHF (congestive heart failure) (HCC) 11/04/2017  . Acute renal failure superimposed on stage 2 chronic kidney disease (HCC) 11/04/2017  . Acute metabolic encephalopathy 11/04/2017  . UTI (urinary tract infection) 11/04/2017  . AKI  (acute kidney injury) (HCC)   . History of CVA with residual deficit   . Prediabetes   . Left hemiparesis (HCC) 03/19/2013  . Cerebral infarction (HCC) 03/19/2013  . Left leg weakness 03/19/2013  . CAD, ARTERY BYPASS GRAFT 09/30/2008  . 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 Borderline hyperglycemia 12/03/2007  . Other and unspecified hyperlipidemia 07/28/2007  . Essential hypertension 07/28/2007  . 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 Coronary atherosclerosis 07/28/2007  . BENIGN PROSTATIC HYPERTROPHY 07/28/2007  . COLONIC POLYPS, 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    Jerni Selmer, Radene Journey MS, CCC-SLP 12/13/2017, 1:05 PM  [facility] [address] [place], [place], [address] Phone: [phone]   Fax:  [phone]   Name: Thomas [name]. MRN: [id number] Date of Birth: 1940-09-10

## 2017-12-17 ENCOUNTER — Encounter: Payer: Self-pay | Admitting: Rehabilitation

## 2017-12-17 ENCOUNTER — Other Ambulatory Visit (INDEPENDENT_AMBULATORY_CARE_PROVIDER_SITE_OTHER): Payer: Medicare Other

## 2017-12-17 ENCOUNTER — Ambulatory Visit: Payer: Medicare Other | Admitting: Rehabilitation

## 2017-12-17 ENCOUNTER — Ambulatory Visit: Payer: Medicare Other | Admitting: Occupational Therapy

## 2017-12-17 DIAGNOSIS — R41842 Visuospatial deficit: Secondary | ICD-10-CM

## 2017-12-17 DIAGNOSIS — I69318 Other symptoms and signs involving cognitive functions following cerebral infarction: Secondary | ICD-10-CM | POA: Diagnosis not present

## 2017-12-17 DIAGNOSIS — R2681 Unsteadiness on feet: Secondary | ICD-10-CM

## 2017-12-17 DIAGNOSIS — M6281 Muscle weakness (generalized): Secondary | ICD-10-CM

## 2017-12-17 DIAGNOSIS — E876 Hypokalemia: Secondary | ICD-10-CM

## 2017-12-17 DIAGNOSIS — R2689 Other abnormalities of gait and mobility: Secondary | ICD-10-CM

## 2017-12-17 DIAGNOSIS — R293 Abnormal posture: Secondary | ICD-10-CM

## 2017-12-17 DIAGNOSIS — R278 Other lack of coordination: Secondary | ICD-10-CM

## 2017-12-17 LAB — BASIC METABOLIC PANEL
BUN: 15 mg/dL (ref 6–23)
CHLORIDE: 102 meq/L (ref 96–112)
CO2: 26 mEq/L (ref 19–32)
CREATININE: 1.27 mg/dL (ref 0.40–1.50)
Calcium: 8.8 mg/dL (ref 8.4–10.5)
GFR: 58.54 mL/min — AB (ref >60.00)
Glucose, Bld: 126 mg/dL — ABNORMAL HIGH (ref 70–99)
Potassium: 4.9 mEq/L (ref 3.5–5.1)
Sodium: 136 mEq/L (ref 135–145)

## 2017-12-17 NOTE — Therapy (Signed)
[facility] [address] [place], [place], [address] Phone: [phone]   Fax:  [phone]  Occupational Therapy Treatment  Patient Details  Name: Thomas [name]. MRN: [id number] Date of Birth: [birth year] Referring Provider: Faith Rogue, MD   Encounter Date: 12/17/2017  OT End of Session - 12/17/17 1138    Visit Number  5    Number of Visits  17    Date for OT Re-Evaluation  01/28/18    Authorization Type  [employer] MCR    OT Start Time  0930    OT Stop Time  1020    OT Time Calculation (min)  50 min    Activity Tolerance  Treatment limited secondary to agitation    Behavior During Therapy  Agitated       Past Medical History:  Diagnosis Date  . CHF (congestive heart failure) (HCC) 1999  . CKD (chronic kidney disease), stage II   . Coronary artery disease   . Hx of colonic polyps   . Hyperlipidemia   . Hyperplastic colon polyp 04/15/2007  . Hypertension   . Prostatic hypertrophy, benign    with elevated PSA  . TIA (transient ischemic attack) 2003   "mini stroke" (03/19/2013)    Past Surgical History:  Procedure Laterality Date  . CARDIAC CATHETERIZATION    . CATARACT EXTRACTION W/ INTRAOCULAR LENS  IMPLANT, BILATERAL Bilateral 1998  . COLONOSCOPY  2008  . CORONARY ARTERY BYPASS GRAFT  1999   x4  . LOOP RECORDER INSERTION N/A 11/05/2017   Procedure: LOOP RECORDER INSERTION;  Surgeon: Hillis Range, MD;  Location: [facility] INVASIVE CV LAB;  Service: Cardiovascular;  Laterality: N/A;  . TEE WITHOUT CARDIOVERSION N/A 11/05/2017   Procedure: TRANSESOPHAGEAL ECHOCARDIOGRAM (TEE) WITH LOOP;  Surgeon: Lars Masson, MD;  Location: [facility] ENDOSCOPY;  Service: Cardiovascular;  Laterality: N/A;    There were no vitals filed for this visit.  Subjective Assessment - 12/17/17 0936    Subjective   We've been practicing the coordination ex's (per wife report)    Pertinent History  Rt CVA 11/03/17. PMH: HTN, CHF, CKD stage  2, previous CVA 2014 with residual Lt LE weakness    Limitations  LOOP RECORDER, cognitive deficits    Patient Stated Goals  Get back to doing things I used to do    Currently in Pain?  No/denies                   OT Treatments/Exercises (OP) - 12/17/17 1129      ADLs   LB Dressing  Pt/wife shown sock aide, however pt did not need. Pt was able to don/doff socks I'ly in clinic. Pt's wife was encouraged to let pt do at home as long as pt is able (to help with flexibility, hand dexeterity, and independence)      Visual/Perceptual Exercises   Scanning  Tabletop    Scanning - Tabletop  Pt performing tabletop scanning (trying to scan and match digital times w/ analogue times) with max cueing to scan Lt. Pt only scanning from middle to Rt side of table and missing entire Lt side of table. Pt does not scan Lt even with auditory cueing (pt does not look at therapist on Lt side when speaking, or when tapping table). Pt needed max simple v.c's and max encouragement to look Lt d/t severe neglect and decreased visual attention and awareness/insight into deficits in general. By end of session: pt looking further Lt w/ less cueing  and even some automatically w/o prompts to auditory stimuli. However, will need reinforcement and carryover at home to be successful. This task took remainder of session and pt only able to find 3 times. Pt was asked to use edge of table as strategy and think of his head as a lighthouse.                OT Short Term Goals - 12/13/17 1026      OT SHORT TERM GOAL #1   Title  Pt/family will verbalize understanding with visual scanning strategies and memory compensatory strategies    Time  4    Period  Weeks    Status  On-going      OT SHORT TERM GOAL #2   Title  Pt/family will verbalize understanding with coordination HEP for bilateral hands    Time  4    Period  Weeks    Status  On-going      OT SHORT TERM GOAL #3   Title  Pt will perform LE dressing  with min assist    Time  4    Period  Weeks    Status  On-going      OT SHORT TERM GOAL #4   Title  Pt will perform toilet transfer with min assist and clothes management with no more than mod assist    Time  4    Period  Weeks    Status  New      OT SHORT TERM GOAL #5   Title  Pt will attend to Lt side for near body and tabletop scanning with > 75% accuracy    Time  4    Period  Weeks    Status  On-going        OT Long Term Goals - 11/28/17 1301      OT LONG TERM GOAL #1   Title  Independent with updated HEP for UE's    Time  8    Period  Weeks    Status  New      OT LONG TERM GOAL #2   Title  Pt will perform LE dressing with set up/sup only    Time  8    Period  Weeks    Status  New      OT LONG TERM GOAL #3   Title  Pt will perform toilet transfer and tub transfer with close sup only using DME prn    Time  8    Period  Weeks    Status  New      OT LONG TERM GOAL #4   Title  Pt will perform clothes management after toileting mod I level    Time  8    Period  Weeks    Status  New      OT LONG TERM GOAL #5   Title  Pt will perform simple snack prep at mod I level using rollator or tray on walker    Time  8    Period  Weeks    Status  New      Long Term Additional Goals   Additional Long Term Goals  Yes      OT LONG TERM GOAL #6   Title  Pt to perform medication management with direct sup/min cueing prn    Time  8    Period  Weeks    Status  New            Plan -  12/17/17 1138    Clinical Impression Statement  Pt easily frustrated by tabletop scanning task today, but w/ max encouragement began to show small improvement and decreased frustration.     Occupational Profile and client history currently impacting functional performance  PMH: HTN, CHF, CKD (stage 2), previous Rt CVA 2014, HOH    Occupational performance deficits (Please refer to evaluation for details):  ADL's;IADL's;Leisure;Social Participation    Rehab Potential  Fair    Current  Impairments/barriers affecting progress:  severity of deficits including cognition and decreased attention and field cut to Lt side    OT Frequency  2x / week    OT Duration  8 weeks    OT Treatment/Interventions  Self-care/ADL training;DME and/or AE instruction;Moist Heat;Aquatic Therapy;Therapeutic activities;Therapeutic exercise;Cognitive remediation/compensation;Coping strategies training;Neuromuscular education;Functional Mobility Training;Passive range of motion;Visual/perceptual remediation/compensation;Manual Therapy;Patient/family education    Plan  standing balance w/ ADLS, visual scanning tasks (walker negotiation in busy environment)     Consulted and Agree with Plan of Care  Patient;Family member/caregiver    Family Member Consulted  Wife       Patient will benefit from skilled therapeutic intervention in order to improve the following deficits and impairments:  Decreased coordination, Decreased range of motion, Difficulty walking, Improper body mechanics, Decreased endurance, Decreased safety awareness, Decreased activity tolerance, Decreased knowledge of precautions, Impaired tone, Impaired UE functional use, Decreased knowledge of use of DME, Decreased balance, Decreased cognition, Decreased mobility, Decreased strength, Impaired perceived functional ability, Impaired vision/preception  Visit Diagnosis: Visuospatial deficit  Other lack of coordination    Problem List Patient Active Problem List   Diagnosis Date Noted  . Foul smelling urine   . Leukocytosis   . Hyponatremia   . 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 Benign essential HTN   . Coronary artery disease involving native coronary artery of native heart without angina pectoris   . Stage 3 chronic kidney disease (HCC)   . Acute lower UTI   . Seasonal allergies   . Stroke (HCC) 11/04/2017  . HLD (hyperlipidemia) 11/04/2017  . Chronic diastolic CHF (congestive heart failure) (HCC) 11/04/2017  . Acute renal  failure superimposed on stage 2 chronic kidney disease (HCC) 11/04/2017  . Acute metabolic encephalopathy 11/04/2017  . UTI (urinary tract infection) 11/04/2017  . AKI (acute kidney injury) (HCC)   . History of CVA with residual deficit   . Prediabetes   . Left hemiparesis (HCC) 03/19/2013  . Cerebral infarction (HCC) 03/19/2013  . Left leg weakness 03/19/2013  . CAD, ARTERY BYPASS GRAFT 09/30/2008  . 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 Borderline hyperglycemia 12/03/2007  . Other and unspecified hyperlipidemia 07/28/2007  . Essential hypertension 07/28/2007  . 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 Coronary atherosclerosis 07/28/2007  . BENIGN PROSTATIC HYPERTROPHY 07/28/2007  . COLONIC POLYPS, 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    Kelli Churn, OTR/L 12/17/2017, 11:40 AM  [facility] [name] [place], [place], [address] Phone: [phone]   Fax:  [phone]  Name: Dakai Haskill. MRN: [id number] Date of Birth: April 05, 1941

## 2017-12-17 NOTE — Therapy (Signed)
[facility] [address] [place], [place], [address] Phone: [phone]   Fax:  [phone]  Physical Therapy Treatment  Patient Details  Name: Thomas [name]. MRN: [id number] Date of Birth: [birth year] Referring Provider: Faith Rogue, MD   Encounter Date: 12/17/2017  PT End of Session - 12/17/17 1311    Visit Number  4    Number of Visits  17 eval + 2x/week for 8 weeks    Date for PT Re-Evaluation  01/27/18    Authorization Type  [employer] medicare     PT Start Time  [phone]    PT Stop Time  0930    PT Time Calculation (min)  43 min    Equipment Utilized During Treatment  Gait belt    Activity Tolerance  Patient tolerated treatment well    Behavior During Therapy  WFL for tasks assessed/performed       Past Medical History:  Diagnosis Date  . CHF (congestive heart failure) (HCC) 1999  . CKD (chronic kidney disease), stage II   . Coronary artery disease   . Hx of colonic polyps   . Hyperlipidemia   . Hyperplastic colon polyp 04/15/2007  . Hypertension   . Prostatic hypertrophy, benign    with elevated PSA  . TIA (transient ischemic attack) 2003   "mini stroke" (03/19/2013)    Past Surgical History:  Procedure Laterality Date  . CARDIAC CATHETERIZATION    . CATARACT EXTRACTION W/ INTRAOCULAR LENS  IMPLANT, BILATERAL Bilateral 1998  . COLONOSCOPY  2008  . CORONARY ARTERY BYPASS GRAFT  1999   x4  . LOOP RECORDER INSERTION N/A 11/05/2017   Procedure: LOOP RECORDER INSERTION;  Surgeon: Hillis Range, MD;  [facility];  Service: Cardiovascular;  Laterality: N/A;  . TEE WITHOUT CARDIOVERSION N/A 11/05/2017   Procedure: TRANSESOPHAGEAL ECHOCARDIOGRAM (TEE) WITH LOOP;  Surgeon: Lars Masson, MD;  Location: [facility] ENDOSCOPY;  Service: Cardiovascular;  Laterality: N/A;    There were no vitals filed for this visit.  Subjective Assessment - 12/17/17 0851    Subjective  Reports no falls, still has  some rib pain on L when moving in bed.  Note that he has to have blood work done today due to high potassium.      Patient is accompained by:  Family member    Pertinent History  CKD, prior CVA using RW prior to recent admission, HTN, CHF.    Patient Stated Goals  Moving "a whole lot easier than I am now."  Wife notes she wants him to get stronger and get back to where he can have a little more independence.    Currently in Pain?  No/denies                       [facility] Adult PT Treatment/Exercise - 12/17/17 0913      Transfers   Transfers  Sit to Stand;Stand to Sit    Sit to Stand  5: Supervision    Sit to Stand Details  Verbal cues for sequencing;Verbal cues for technique;Verbal cues for precautions/safety    Sit to Stand Details (indicate cue type and reason)  Pt doing markedly better with hand placement and standing without bracing LEs on mat to stand.      Stand to Sit  5: Supervision    Stand to Sit Details (indicate cue type and reason)  Verbal cues for sequencing;Verbal cues for technique;Verbal cues for precautions/safety  Ambulation/Gait   Ambulation/Gait  Yes    Ambulation/Gait Assistance  4: Min guard    Ambulation/Gait Assistance Details  Continue to address gait with RW for improved quality and endurance.  Note L knee recurvatum.  PT asked wife about this and states that he has been doing this for 5 years since previous CVA.  He has KAFO that he refuses to wear and PTs at [facility] tried to wedge him unsuccessfully.  Educated on deficits and benefits of having some sort of AFO/brace to address.  PT attempted use of newer stronger PLS AFO, however was too large for shoe.  PT to have Thayer Ohm from [employer] come to future session in order to discuss options and perhaps he has something in his office that pt could try before getting made to ensure compliance with wear.  Pt and wife verbalized understanding.  Also note that during gait he continues to require contstant cues for  posture and improving LLE external rotation.  Pt has to be stopped, cued to look at foot and then he can turn, however he is unable to do so during gait.      Ambulation Distance (Feet)  300 Feet    Assistive device  Rolling walker    Gait Pattern  Step-through pattern;Decreased step length - right;Decreased step length - left;Left genu recurvatum;Decreased trunk rotation;Decreased weight shift to left    Ambulation Surface  Level;Indoor    Gait Comments  Note PT also tried knee cage during gait and this did not seem to make any difference in recurvatum either.        Neuro Re-ed    Neuro Re-ed Details   Standing hip abd x 10 reps on each side for proximal hip strengthening/activation in stance, standing marching x 10 reps for improved LLE WB and weight shift with cues for posture throughout.  Provided passive trunk extension stretch during seated rest break, however pt reports increased shoulder pain.        Knee/Hip Exercises: Aerobic   Nustep  BUEs/LEs x 5 mins at level 3 resistance.  Pt tolerated well.              PT Education - 12/17/17 1310    Education provided  Yes    Education Details  getting order for brace    Person(s) Educated  Patient;Spouse    Methods  Explanation    Comprehension  Verbalized understanding       PT Short Term Goals - 11/28/17 1118      PT SHORT TERM GOAL #1   Title  The patient will be indep with HEP for LE strengthening, posture, stretching and general mobility.    Time  4    Period  Weeks    Target Date  12/28/17      PT SHORT TERM GOAL #2   Title  The patient will improve Berg balance score from 7/56 to > or equal to 16/56 to demonstrate improving independence for standing balance and transfers.    Time  4    Period  Weeks    Target Date  12/28/17      PT SHORT TERM GOAL #3   Title  The patient will move sit<>stand with UE support 5/5 trials mod indep to RW.    Time  4    Period  Weeks    Target Date  12/28/17      PT SHORT TERM  GOAL #4   Title  The patient will be further assessed  on gait speed and LTG to follow.    Time  4    Period  Weeks    Target Date  12/28/17      PT SHORT TERM GOAL #5   Title  The patient will ambulate x 200 ft nonstop with RW and CGA to demonstrate improving endurance and dec'd caregiver assist.    Time  4    Period  Weeks    Target Date  12/28/17        PT Long Term Goals - 11/28/17 1121      PT LONG TERM GOAL #1   Title  The patient will improve functional status score from 21% to > or equal to 35% to demo improved perception of functional abiltiies.    Time  8    Period  Weeks    Target Date  01/27/18      PT LONG TERM GOAL #2   Title  The patient will improve gait speed *baseline to be established after eval.    Time  8    Period  Weeks    Target Date  01/27/18      PT LONG TERM GOAL #3   Title  The patient will improve Berg score from 7/56 to > or equal to 22/56 to demo improving standing balance for ADLs/functional tasks.    Time  8    Period  Weeks    Target Date  01/27/18      PT LONG TERM GOAL #4   Title  The patient will negotiate 4 steps with one HR with supervision for safe entrance/exit from home.    Time  8    Period  Weeks    Target Date  01/27/18      PT LONG TERM GOAL #5   Title  The patient will ambulate x 350 ft with RW mod indep for improved household and short distance community negotiation to reduce caregiver assist.    Time  8    Period  Weeks    Target Date  01/27/18      Additional Long Term Goals   Additional Long Term Goals  Yes      PT LONG TERM GOAL #6   Title  The patient will move floor<>stand with UE support and min A due to h/o falls.    Time  8    Period  Weeks    Target Date  01/27/18            Plan - 12/17/17 1311    Clinical Impression Statement  Session continues to focus on gait with RW for improved quality and distance.  Note LLE knee recurvatum during gait.  Pts wife reports they have KAFO at home, however he  refuses to wear.  Pt willing to try something as long as it can "be worn under pants."  Will get consult with Thayer Ohm from [employer] to see if he could maybe try something before making order.  Pt and spouse verbalize understanding.     PT Treatment/Interventions  ADLs/Self Care Home Management;Gait training;Stair training;Functional mobility training;Therapeutic activities;Therapeutic exercise;Neuromuscular re-education;Balance training;Patient/family education;Orthotic Fit/Training;Manual techniques    PT Next Visit Plan  check HEP periodically , seated postural strengthening, standing balance in clinic, gait training emphasizing endurance and L visual scanning to avoid obstacles    Consulted and Agree with Plan of Care  Patient       Patient will benefit from skilled therapeutic intervention in order to improve the following deficits and  impairments:  Abnormal gait, Decreased endurance, Decreased activity tolerance, Decreased strength, Decreased safety awareness, Impaired flexibility, Postural dysfunction, Decreased balance, Decreased mobility  Visit Diagnosis: Unsteadiness on feet  Muscle weakness (generalized)  Abnormal posture  Other abnormalities of gait and mobility     Problem List Patient Active Problem List   Diagnosis Date Noted  . Foul smelling urine   . Leukocytosis   . Hyponatremia   . 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 Benign essential HTN   . Coronary artery disease involving native coronary artery of native heart without angina pectoris   . Stage 3 chronic kidney disease (HCC)   . Acute lower UTI   . Seasonal allergies   . Stroke (HCC) 11/04/2017  . HLD (hyperlipidemia) 11/04/2017  . Chronic diastolic CHF (congestive heart failure) (HCC) 11/04/2017  . Acute renal failure superimposed on stage 2 chronic kidney disease (HCC) 11/04/2017  . Acute metabolic encephalopathy 11/04/2017  . UTI (urinary tract infection) 11/04/2017  . AKI (acute kidney injury) (HCC)    . History of CVA with residual deficit   . Prediabetes   . Left hemiparesis (HCC) 03/19/2013  . Cerebral infarction (HCC) 03/19/2013  . Left leg weakness 03/19/2013  . CAD, ARTERY BYPASS GRAFT 09/30/2008  . 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 Borderline hyperglycemia 12/03/2007  . Other and unspecified hyperlipidemia 07/28/2007  . Essential hypertension 07/28/2007  . 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 Coronary atherosclerosis 07/28/2007  . BENIGN PROSTATIC HYPERTROPHY 07/28/2007  . COLONIC POLYPS, 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   Harriet Butte, PT, MPT [facility] [address] [place], [place], [address] Phone: [phone]   Fax:  [phone] 12/17/17, 1:15 PM  Name: Christia Coaxum. MRN: [id number] Date of Birth: 19-Oct-1940

## 2017-12-19 ENCOUNTER — Ambulatory Visit: Payer: Medicare Other | Admitting: Occupational Therapy

## 2017-12-19 ENCOUNTER — Encounter: Payer: Self-pay | Admitting: Rehabilitative and Restorative Service Providers"

## 2017-12-19 ENCOUNTER — Ambulatory Visit: Payer: Medicare Other | Admitting: Rehabilitative and Restorative Service Providers"

## 2017-12-19 DIAGNOSIS — R2689 Other abnormalities of gait and mobility: Secondary | ICD-10-CM

## 2017-12-19 DIAGNOSIS — M6281 Muscle weakness (generalized): Secondary | ICD-10-CM

## 2017-12-19 DIAGNOSIS — R2681 Unsteadiness on feet: Secondary | ICD-10-CM

## 2017-12-19 DIAGNOSIS — I69318 Other symptoms and signs involving cognitive functions following cerebral infarction: Secondary | ICD-10-CM | POA: Diagnosis not present

## 2017-12-19 DIAGNOSIS — R41842 Visuospatial deficit: Secondary | ICD-10-CM

## 2017-12-19 NOTE — Therapy (Signed)
[facility] [address] [place], [place], [address] Phone: [phone]   Fax:  [phone]  Occupational Therapy Treatment  Patient Details  Name: Thomas [name]. MRN: [id number] Date of Birth: [birth year] Referring Provider: Faith Rogue, MD   Encounter Date: 12/19/2017  OT End of Session - 12/19/17 1502    Visit Number  6    Number of Visits  17    Date for OT Re-Evaluation  01/28/18    Authorization Type  [employer] MCR    OT Start Time  1315    OT Stop Time  1400    OT Time Calculation (min)  45 min    Activity Tolerance  Patient tolerated treatment well    Behavior During Therapy  [facility] for tasks assessed/performed       Past Medical History:  Diagnosis Date  . CHF (congestive heart failure) (HCC) 1999  . CKD (chronic kidney disease), stage II   . Coronary artery disease   . Hx of colonic polyps   . Hyperlipidemia   . Hyperplastic colon polyp 04/15/2007  . Hypertension   . Prostatic hypertrophy, benign    with elevated PSA  . TIA (transient ischemic attack) 2003   "mini stroke" (03/19/2013)    Past Surgical History:  Procedure Laterality Date  . CARDIAC CATHETERIZATION    . CATARACT EXTRACTION W/ INTRAOCULAR LENS  IMPLANT, BILATERAL Bilateral 1998  . COLONOSCOPY  2008  . CORONARY ARTERY BYPASS GRAFT  1999   x4  . LOOP RECORDER INSERTION N/A 11/05/2017   Procedure: LOOP RECORDER INSERTION;  Surgeon: Hillis Range, MD;  [facility]: [facility];  Service: Cardiovascular;  Laterality: N/A;  . TEE WITHOUT CARDIOVERSION N/A 11/05/2017   Procedure: TRANSESOPHAGEAL ECHOCARDIOGRAM (TEE) WITH LOOP;  Surgeon: Lars Masson, MD;  Location: [facility] ENDOSCOPY;  Service: Cardiovascular;  Laterality: N/A;    There were no vitals filed for this visit.  Subjective Assessment - 12/19/17 1352    Pertinent History  Rt CVA 11/03/17. PMH: HTN, CHF, CKD stage 2, previous CVA 2014 with residual Lt LE weakness    Limitations  LOOP RECORDER, cognitive deficits    Patient Stated Goals  Get back to doing things I used to do    Currently in Pain?  No/denies                   OT Treatments/Exercises (OP) - 12/19/17 0001      ADLs   LB Dressing  Simulated LE dressing in standing to pull pants over hips x 5 (using theraband) w/ close sup - no LOB    ADL Comments  Pt's wife issued [facility] info      Visual/Perceptual Exercises   Scanning  Tabletop;Environmental    Scanning - Environmental  Pt cued before starting walker negotiation/environmental scanning in gym to scan entire gym, look for landmarks for visual memory, plan out route, and look for any obstacles - Pt required mod to max cueing to do this. Pt then started route, bumped into things on Lt side x 2 and was asked to stop and look for obstacle then readjust walker before continuing.     Scanning - Tabletop  Pt scanning to match cards on tabletop w/ only occasional min cues to scan Lt on tabletop. Pt did however require mod to max cueing to use Lt hand               OT Short Term Goals -  12/13/17 1026      OT SHORT TERM GOAL #1   Title  Pt/family will verbalize understanding with visual scanning strategies and memory compensatory strategies    Time  4    Period  Weeks    Status  On-going      OT SHORT TERM GOAL #2   Title  Pt/family will verbalize understanding with coordination HEP for bilateral hands    Time  4    Period  Weeks    Status  On-going      OT SHORT TERM GOAL #3   Title  Pt will perform LE dressing with min assist    Time  4    Period  Weeks    Status  On-going      OT SHORT TERM GOAL #4   Title  Pt will perform toilet transfer with min assist and clothes management with no more than mod assist    Time  4    Period  Weeks    Status  New      OT SHORT TERM GOAL #5   Title  Pt will attend to Lt side for near body and tabletop scanning with > 75% accuracy    Time  4    Period   Weeks    Status  On-going        OT Long Term Goals - 11/28/17 1301      OT LONG TERM GOAL #1   Title  Independent with updated HEP for UE's    Time  8    Period  Weeks    Status  New      OT LONG TERM GOAL #2   Title  Pt will perform LE dressing with set up/sup only    Time  8    Period  Weeks    Status  New      OT LONG TERM GOAL #3   Title  Pt will perform toilet transfer and tub transfer with close sup only using DME prn    Time  8    Period  Weeks    Status  New      OT LONG TERM GOAL #4   Title  Pt will perform clothes management after toileting mod I level    Time  8    Period  Weeks    Status  New      OT LONG TERM GOAL #5   Title  Pt will perform simple snack prep at mod I level using rollator or tray on walker    Time  8    Period  Weeks    Status  New      Long Term Additional Goals   Additional Long Term Goals  Yes      OT LONG TERM GOAL #6   Title  Pt to perform medication management with direct sup/min cueing prn    Time  8    Period  Weeks    Status  New            Plan - 12/19/17 1503    Clinical Impression Statement  Pt with improved automatic scanning to Lt side for tabletop scanning today, but still required max cueing for environmental scanning and to use Lt hand.     Occupational Profile and client history currently impacting functional performance  PMH: HTN, CHF, CKD (stage 2), previous Rt CVA 2014, HOH    Occupational performance deficits (Please refer to evaluation for details):  ADL's;IADL's;Leisure;Social Participation    Rehab Potential  Fair    Current Impairments/barriers affecting progress:  severity of deficits including cognition and decreased attention and field cut to Lt side    OT Frequency  2x / week    OT Duration  8 weeks    OT Treatment/Interventions  Self-care/ADL training;DME and/or AE instruction;Moist Heat;Aquatic Therapy;Therapeutic activities;Therapeutic exercise;Cognitive remediation/compensation;Coping  strategies training;Neuromuscular education;Functional Mobility Training;Passive range of motion;Visual/perceptual remediation/compensation;Manual Therapy;Patient/family education    Plan  continue tabletop and environmental scanning (especially to Lt side), functional use of Lt hand       Patient will benefit from skilled therapeutic intervention in order to improve the following deficits and impairments:  Decreased coordination, Decreased range of motion, Difficulty walking, Improper body mechanics, Decreased endurance, Decreased safety awareness, Decreased activity tolerance, Decreased knowledge of precautions, Impaired tone, Impaired UE functional use, Decreased knowledge of use of DME, Decreased balance, Decreased cognition, Decreased mobility, Decreased strength, Impaired perceived functional ability, Impaired vision/preception  Visit Diagnosis: Visuospatial deficit  Unsteadiness on feet    Problem List Patient Active Problem List   Diagnosis Date Noted  . Foul smelling urine   . Leukocytosis   . Hyponatremia   . 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 Benign essential HTN   . Coronary artery disease involving native coronary artery of native heart without angina pectoris   . Stage 3 chronic kidney disease (HCC)   . Acute lower UTI   . Seasonal allergies   . Stroke (HCC) 11/04/2017  . HLD (hyperlipidemia) 11/04/2017  . Chronic diastolic CHF (congestive heart failure) (HCC) 11/04/2017  . Acute renal failure superimposed on stage 2 chronic kidney disease (HCC) 11/04/2017  . Acute metabolic encephalopathy 11/04/2017  . UTI (urinary tract infection) 11/04/2017  . AKI (acute kidney injury) (HCC)   . History of CVA with residual deficit   . Prediabetes   . Left hemiparesis (HCC) 03/19/2013  . Cerebral infarction (HCC) 03/19/2013  . Left leg weakness 03/19/2013  . CAD, ARTERY BYPASS GRAFT 09/30/2008  . 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 Borderline hyperglycemia 12/03/2007  .  Other and unspecified hyperlipidemia 07/28/2007  . Essential hypertension 07/28/2007  . 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 Coronary atherosclerosis 07/28/2007  . BENIGN PROSTATIC HYPERTROPHY 07/28/2007  . COLONIC POLYPS, 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    Kelli Churn, OTR/L 12/19/2017, 3:04 PM  [facility] [address] [place], [place], [address] Phone: [phone]   Fax:  [phone]  Name: Ajmal Kathan. MRN: [id number] Date of Birth: 03-08-1941

## 2017-12-19 NOTE — Therapy (Signed)
[facility] [address] [place], [place], [address] Phone: [phone]   Fax:  [phone]  Physical Therapy Treatment  Patient Details  Name: Thomas [name]. MRN: [id number] Date of Birth: 1941-07-26 Referring Provider: Faith Rogue, MD   Encounter Date: 12/19/2017  PT End of Session - 12/19/17 1455    Visit Number  5    Number of Visits  17 eval + 2x/week for 8 weeks    Date for PT Re-Evaluation  01/27/18    Authorization Type  [employer]HC medicare     PT Start Time  1400    PT Stop Time  1440    PT Time Calculation (min)  40 min    Equipment Utilized During Treatment  Gait belt    Activity Tolerance  Patient tolerated treatment well    Behavior During Therapy  WFL for tasks assessed/performed       Past Medical History:  Diagnosis Date  . CHF (congestive heart failure) (HCC) 1999  . CKD (chronic kidney disease), stage II   . Coronary artery disease   . Hx of colonic polyps   . Hyperlipidemia   . Hyperplastic colon polyp 04/15/2007  . Hypertension   . Prostatic hypertrophy, benign    with elevated PSA  . TIA (transient ischemic attack) 2003   "mini stroke" (03/19/2013)    Past Surgical History:  Procedure Laterality Date  . CARDIAC CATHETERIZATION    . CATARACT EXTRACTION W/ INTRAOCULAR LENS  IMPLANT, BILATERAL Bilateral 1998  . COLONOSCOPY  2008  . CORONARY ARTERY BYPASS GRAFT  1999   x4  . LOOP RECORDER INSERTION N/A 11/05/2017   Procedure: LOOP RECORDER INSERTION;  Surgeon: Hillis Range, MD;  [facility]: [facility] INVAS[facility] LAB;  Service: Cardiovascular;  Laterality: N/A;  . TEE WITHOUT CARDIOVERSION N/A 11/05/2017   Procedure: TRANSESOPHAGEAL ECHOCARDIOGRAM (TEE) WITH LOOP;  Surgeon: Lars Masson, MD;  Location: [facility] ENDOSCOPY;  Service: Cardiovascular;  Laterality: N/A;    There were no vitals filed for this visit.  Subjective Assessment - 12/19/17 1401    Subjective  The patient reports no falls.     Pertinent History  CKD, prior CVA using RW prior to recent admission, HTN, CHF.    Patient Stated Goals  Moving "a whole lot easier than I am now."  Wife notes she wants him to get stronger and get back to where he can have a little more independence.    Currently in Pain?  No/denies         [facility] PT Assessment - 12/19/17 1403      Ambulation/Gait   Ambulation Distance (Feet)  120 Feet 120, 60 x 2      Standardized Balance Assessment   Standardized Balance Assessment  Berg Balance Test      Berg Balance Test   Sit to Stand  Able to stand  independently using hands    Standing Unsupported  Able to stand 2 minutes with supervision    Sitting with Back Unsupported but Feet Supported on Floor or Stool  Able to sit safely and securely 2 minutes    Stand to Sit  Controls descent by using hands    Transfers  Able to transfer with verbal cueing and /or supervision    Standing Unsupported with Eyes Closed  Able to stand 10 seconds with supervision    Standing Ubsupported with Feet Together  Needs help to attain position but able to stand for 30 seconds with feet together  From Standing, Reach Forward with Outstretched Arm  Loses balance while trying/requires external support    From Standing Position, Pick up Object from Floor  Unable to try/needs assist to keep balance    From Standing Position, Turn to Look Behind Over each Shoulder  Needs assist to keep from losing balance and falling    Turn 360 Degrees  Needs assistance while turning    Standing Unsupported, Alternately Place Feet on Step/Stool  Needs assistance to keep from falling or unable to try    Standing Unsupported, One Foot in Front  Loses balance while stepping or standing    Standing on One Leg  Unable to try or needs assist to prevent fall    Total Score  19    Berg comment:  19/56                   [facility] Adult PT Treatment/Exercise - 12/19/17 1403      Ambulation/Gait   Ambulation/Gait  Yes     Ambulation/Gait Assistance  4: Min guard    Ambulation/Gait Assistance Details  Patient ambulated with visual scanning prior to walking for spatial/environmental awareness and continued to need cues to ensure he clears obstacles on the left side.  Patient fatigued during gait activities and began grunting to sit down.  PT provided constant min A and verbal cues to guide patient and attempt to de-escalate him when fatigued.  PT let patient rest frequently t/o session.    Assistive device  Rolling walker    Gait Pattern  Step-through pattern;Decreased step length - right;Decreased step length - left;Left genu recurvatum;Decreased trunk rotation;Decreased weight shift to left    Ambulation Surface  Level;Indoor      Neuro Re-ed    Neuro Re-ed Details   Sidestepping in parallel bars with min A and demo + verbal cues for foot position.  Berg balance improved from 7/56 up to 19.56.       Exercises   Exercises  Other Exercises    Other Exercises   Seated hamstring curls with green theraband, sit<.stand x 10 reps.  UE alternating reaching x 10 reps, standing heel raises x 10 reps             PT Education - 12/19/17 1453    Education provided  Yes    Education Details  discussed providing visual scanning cues for patient before walking     Person(s) Educated  Patient    Methods  Explanation;Demonstration;Handout    Comprehension  Verbalized understanding;Returned demonstration       PT Short Term Goals - 12/19/17 1455      PT SHORT TERM GOAL #1   Title  The patient will be indep with HEP for LE strengthening, posture, stretching and general mobility.    Time  4    Period  Weeks      PT SHORT TERM GOAL #2   Title  The patient will improve Berg balance score from 7/56 to > or equal to 16/56 to demonstrate improving independence for standing balance and transfers.    Baseline  Improved to 19/56.    Time  4    Period  Weeks    Status  Achieved      PT SHORT TERM GOAL #3   Title  The  patient will move sit<>stand with UE support 5/5 trials mod indep to RW.    Baseline  moderate to max    Time  4    Period  Weeks      PT SHORT TERM GOAL #4   Title  The patient will be further assessed on gait speed and LTG to follow.    Time  4    Period  Weeks      PT SHORT TERM GOAL #5   Title  The patient will ambulate x 200 ft nonstop with RW and CGA to demonstrate improving endurance and dec'd caregiver assist.    Time  4    Period  Weeks        PT Long Term Goals - 11/28/17 1121      PT LONG TERM GOAL #1   Title  The patient will improve functional status score from 21% to > or equal to 35% to demo improved perception of functional abiltiies.    Time  8    Period  Weeks    Target Date  01/27/18      PT LONG TERM GOAL #2   Title  The patient will improve gait speed *baseline to be established after eval.    Time  8    Period  Weeks    Target Date  01/27/18      PT LONG TERM GOAL #3   Title  The patient will improve Berg score from 7/56 to > or equal to 22/56 to demo improving standing balance for ADLs/functional tasks.    Time  8    Period  Weeks    Target Date  01/27/18      PT LONG TERM GOAL #4   Title  The patient will negotiate 4 steps with one HR with supervision for safe entrance/exit from home.    Time  8    Period  Weeks    Target Date  01/27/18      PT LONG TERM GOAL #5   Title  The patient will ambulate x 350 ft with RW mod indep for improved household and short distance community negotiation to reduce caregiver assist.    Time  8    Period  Weeks    Target Date  01/27/18      Additional Long Term Goals   Additional Long Term Goals  Yes      PT LONG TERM GOAL #6   Title  The patient will move floor<>stand with UE support and min A due to h/o falls.    Time  8    Period  Weeks    Target Date  01/27/18            Plan - 12/19/17 1457    Clinical Impression Statement  The patient is making progress with transfers requiring supervision  for sit<>stand and improving Berg from 7/56 up to 19/56.  PT to continue working towards other STGs.  The patient needs verbal cues to safely navigate therapy clinic due to L inattention/neglect.  AFO consult in the plan for upcoming visits due to L knee genu recurvatum.    PT Treatment/Interventions  ADLs/Self Care Home Management;Gait training;Stair training;Functional mobility training;Therapeutic activities;Therapeutic exercise;Neuromuscular re-education;Balance training;Patient/family education;Orthotic Fit/Training;Manual techniques    PT Next Visit Plan  Finish checking STGs , check HEP periodically , seated postural strengthening, standing balance in clinic, gait training emphasizing endurance and L visual scanning to avoid obstacles    Consulted and Agree with Plan of Care  Patient       Patient will benefit from skilled therapeutic intervention in order to improve the following deficits and impairments:  Abnormal gait, Decreased endurance, Decreased  activity tolerance, Decreased strength, Decreased safety awareness, Impaired flexibility, Postural dysfunction, Decreased balance, Decreased mobility  Visit Diagnosis: Other abnormalities of gait and mobility  Muscle weakness (generalized)  Unsteadiness on feet     Problem List Patient Active Problem List   Diagnosis Date Noted  . Foul smelling urine   . Leukocytosis   . Hyponatremia   . 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 Benign essential HTN   . Coronary artery disease involving native coronary artery of native heart without angina pectoris   . Stage 3 chronic kidney disease (HCC)   . Acute lower UTI   . Seasonal allergies   . Stroke (HCC) 11/04/2017  . HLD (hyperlipidemia) 11/04/2017  . Chronic diastolic CHF (congestive heart failure) (HCC) 11/04/2017  . Acute renal failure superimposed on stage 2 chronic kidney disease (HCC) 11/04/2017  . Acute metabolic encephalopathy 11/04/2017  . UTI (urinary tract infection)  11/04/2017  . AKI (acute kidney injury) (HCC)   . History of CVA with residual deficit   . Prediabetes   . Left hemiparesis (HCC) 03/19/2013  . Cerebral infarction (HCC) 03/19/2013  . Left leg weakness 03/19/2013  . CAD, ARTERY BYPASS GRAFT 09/30/2008  . 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 Borderline hyperglycemia 12/03/2007  . Other and unspecified hyperlipidemia 07/28/2007  . Essential hypertension 07/28/2007  . 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 Coronary atherosclerosis 07/28/2007  . BENIGN PROSTATIC HYPERTROPHY 07/28/2007  . COLONIC POLYPS, 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    Rhodie Cienfuegos, PT 12/19/2017, 3:05 PM  [facility] [address] [place], [place], [address] Phone: [phone]   Fax:  [phone]  Name: Hriday Stai. MRN: [id number] Date of Birth: 04/05/1941

## 2017-12-23 ENCOUNTER — Encounter: Payer: Self-pay | Admitting: Rehabilitation

## 2017-12-23 ENCOUNTER — Ambulatory Visit: Payer: Medicare Other | Admitting: Neurology

## 2017-12-23 ENCOUNTER — Ambulatory Visit: Payer: Medicare Other | Admitting: Rehabilitation

## 2017-12-23 ENCOUNTER — Encounter: Payer: Self-pay | Admitting: Neurology

## 2017-12-23 ENCOUNTER — Ambulatory Visit: Payer: Medicare Other | Admitting: Occupational Therapy

## 2017-12-23 VITALS — BP 117/66 | HR 65 | Ht 70.0 in | Wt 166.0 lb

## 2017-12-23 DIAGNOSIS — F01518 Vascular dementia, unspecified severity, with other behavioral disturbance: Secondary | ICD-10-CM

## 2017-12-23 DIAGNOSIS — I63531 Cerebral infarction due to unspecified occlusion or stenosis of right posterior cerebral artery: Secondary | ICD-10-CM

## 2017-12-23 DIAGNOSIS — F0151 Vascular dementia with behavioral disturbance: Secondary | ICD-10-CM

## 2017-12-23 DIAGNOSIS — R2681 Unsteadiness on feet: Secondary | ICD-10-CM

## 2017-12-23 DIAGNOSIS — I2510 Atherosclerotic heart disease of native coronary artery without angina pectoris: Secondary | ICD-10-CM

## 2017-12-23 DIAGNOSIS — I69318 Other symptoms and signs involving cognitive functions following cerebral infarction: Secondary | ICD-10-CM | POA: Diagnosis not present

## 2017-12-23 DIAGNOSIS — R278 Other lack of coordination: Secondary | ICD-10-CM

## 2017-12-23 DIAGNOSIS — R2689 Other abnormalities of gait and mobility: Secondary | ICD-10-CM

## 2017-12-23 DIAGNOSIS — M6281 Muscle weakness (generalized): Secondary | ICD-10-CM

## 2017-12-23 DIAGNOSIS — Z8673 Personal history of transient ischemic attack (TIA), and cerebral infarction without residual deficits: Secondary | ICD-10-CM | POA: Diagnosis not present

## 2017-12-23 DIAGNOSIS — I10 Essential (primary) hypertension: Secondary | ICD-10-CM

## 2017-12-23 DIAGNOSIS — R41842 Visuospatial deficit: Secondary | ICD-10-CM

## 2017-12-23 DIAGNOSIS — R293 Abnormal posture: Secondary | ICD-10-CM

## 2017-12-23 DIAGNOSIS — E785 Hyperlipidemia, unspecified: Secondary | ICD-10-CM

## 2017-12-23 DIAGNOSIS — I69354 Hemiplegia and hemiparesis following cerebral infarction affecting left non-dominant side: Secondary | ICD-10-CM

## 2017-12-23 MED ORDER — QUETIAPINE FUMARATE 25 MG PO TABS: 12.5000 mg | ORAL_TABLET | Freq: Every day | ORAL | 1 refills | Status: DC

## 2017-12-23 NOTE — Patient Instructions (Signed)
Bridging    Slowly raise buttocks from floor, keeping stomach tight. Add a pillow (you may have to fold it) and place between knees and squeeze pillow while you lift butt.  Don't drop the pillow! Repeat _10___ times per set. Do __1__ sets per session. Do _2___ sessions per day.  http://orth.exer.us/1096   Copyright  VHI. All rights reserved.   Sit to Stand: Head Upright    With head upright, stand up slowly with eyes open.  Place chair against wall for safety.  Repeat __10__ times per session. Do __2__ sessions per day.  Copyright  VHI. All rights reserved.   Heel Slides    Squeeze pelvic floor and hold. Slide left heel along bed towards bottom. Hold for __5_ seconds. Slide back to flat knee position. Repeat __10_ times. Do _2__ times a day. Repeat with other leg.  Make sure leg stays in line with body.     Copyright  VHI. All rights reserved.   Knee Extension (Hamstring Stretch) With Pelvis Neutral    Slowly and gently bring foot up and place on step stool. Be sure pelvis does not tip backward (flex) or rotate.  Sit up tall and try to get knee straight. Hold _30-45__ seconds. Do _3__ times, each leg, _2__ times per day.  http://ss.exer.us/88   Copyright  VHI. All rights reserved.   Gastroc / Heel Cord Stretch - Seated With Towel    Sit in chair with foot on stool, place strap or towel around ball of foot. Gently pull foot in toward body, stretching heel cord and calf. Hold for _30-45__ seconds. Repeat on involved leg. Repeat _3__ times. Do __2_ times per day.  Copyright  VHI. All rights reserved.

## 2017-12-23 NOTE — Therapy (Signed)
[facility] [address] [place], [place], [address] Phone: [phone]   Fax:  [phone]  Occupational Therapy Treatment  Patient Details  Name: Thomas [name]. MRN: [id number] Date of Birth: 12/18/1940 Referring Provider: Alger Simons, MD   Encounter Date: 12/23/2017  OT End of Session - 12/23/17 1020    Visit Number  7    Number of Visits  17    Date for OT Re-Evaluation  01/28/18    Authorization Type  [employer] MCR    OT Start Time  0930    OT Stop Time  1015    OT Time Calculation (min)  45 min    Activity Tolerance  Patient tolerated treatment well    Behavior During Therapy  [facility] for tasks assessed/performed       Past Medical History:  Diagnosis Date  . CHF (congestive heart failure) ([place]) 1999  . CKD (chronic kidney disease), stage II   . Coronary artery disease   . Hx of colonic polyps   . Hyperlipidemia   . Hyperplastic colon polyp 04/15/2007  . Hypertension   . Prostatic hypertrophy, benign    with elevated PSA  . TIA (transient ischemic attack) 2003   "mini stroke" (03/19/2013)    Past Surgical History:  Procedure Laterality Date  . CARDIAC CATHETERIZATION    . CATARACT EXTRACTION W/ INTRAOCULAR LENS  IMPLANT, BILATERAL Bilateral 1998  . COLONOSCOPY  2008  . CORONARY ARTERY BYPASS GRAFT  1999   x4  . LOOP RECORDER INSERTION N/A 11/05/2017   Procedure: LOOP RECORDER INSERTION;  Surgeon: Thompson Grayer, MD;  Location: [place] CV LAB;  Service: Cardiovascular;  Laterality: N/A;  . TEE WITHOUT CARDIOVERSION N/A 11/05/2017   Procedure: TRANSESOPHAGEAL ECHOCARDIOGRAM (TEE) WITH LOOP;  Surgeon: Dorothy Spark, MD;  Location: [facility] ENDOSCOPY;  Service: Cardiovascular;  Laterality: N/A;    There were no vitals filed for this visit.  Subjective Assessment - 12/23/17 0946    Subjective   He's doing his socks and shoes. He's dressing himself    Pertinent History  Rt CVA 11/03/17. 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  No/denies                   OT Treatments/Exercises (OP) - 12/23/17 0001      ADLs   ADL Comments  Reviewed progress towards STG's with pt/wife - see goal section      Visual/Perceptual Exercises   Scanning - Environmental  Pt cued before starting walker negotiation/environmental scanning in gym to scan entire gym, look for landmarks for visual memory, plan out route, and look for any obstacles - Pt required mod cueing to do this. Pt then started route, bumped into things on Lt side x 2 and was asked to stop and look and readjust walker before continuing.  Wife present this time for education      Functional Reaching Activities   Low Level  Flipping cards over Lt hand on tabletop while calling out card numbers    Mid Level  Placing checkers into Connect 4 slots Lt hand for coordination, functional reaching LUE, forced use and attention to Lt side, and visual scanning with min cueing to use Lt hand, and complete columns fully on  Lt side              OT Short Term Goals - 12/23/17 1020      OT SHORT TERM GOAL #1   Title  Pt/family will verbalize understanding with visual scanning strategies and memory compensatory strategies    Time  4    Period  Weeks    Status  Achieved      OT SHORT TERM GOAL #2   Title  Pt/family will verbalize understanding with coordination HEP for bilateral hands    Time  4    Period  Weeks    Status  Achieved      OT SHORT TERM GOAL #3   Title  Pt will perform LE dressing with min assist    Time  4    Period  Weeks    Status  Achieved pt doing only with supervision      OT SHORT TERM GOAL #4   Title  Pt will perform toilet transfer with min assist and clothes management with no more than mod assist    Time  4    Period  Weeks    Status   Achieved supervision only      OT SHORT TERM GOAL #5   Title  Pt will attend to Lt side for near body and tabletop scanning with > 75% accuracy    Time  4    Period  Weeks    Status  On-going        OT Long Term Goals - 12/23/17 1024      OT LONG TERM GOAL #1   Title  Independent with updated HEP for UE's    Time  8    Period  Weeks    Status  New      OT LONG TERM GOAL #2   Title  Pt will perform LE dressing with set up/sup only    Time  8    Period  Weeks    Status  Achieved      OT LONG TERM GOAL #3   Title  Pt will perform toilet transfer and tub transfer with close sup only using DME prn    Time  8    Period  Weeks    Status  On-going      OT LONG TERM GOAL #4   Title  Pt will perform clothes management after toileting supervision level    Time  8    Period  Weeks    Status  Revised      OT LONG TERM GOAL #5   Title  Pt will perform simple snack prep at supervision level using rollator or tray on walker    Time  8    Period  Weeks    Status  Revised      OT LONG TERM GOAL #6   Title  Pt to perform medication management with direct sup/min cueing prn    Time  8    Period  Weeks    Status  Deferred due to cognitive deficits            Plan - 12/23/17 1021    Clinical Impression Statement  Pt met 4/5 STG's. Revised some LTG's d/t pt's cognitive status and safety concerns with balance and visual scanning. Pt with improved use of Lt hand in forced paradigm and improved tabletop scanning, but still requires mod to max cues for environmental scanning and needs supervision for safety  Occupational Profile and client history currently impacting functional performance  PMH: HTN, CHF, CKD (stage 2), previous Rt CVA 2014, HOH    Rehab Potential  Fair    Current Impairments/barriers affecting progress:  severity of deficits including cognition and decreased attention and field cut to Lt side    OT Frequency  2x / week    OT Duration  8 weeks    OT  Treatment/Interventions  Self-care/ADL training;DME and/or AE instruction;Moist Heat;Aquatic Therapy;Therapeutic activities;Therapeutic exercise;Cognitive remediation/compensation;Coping strategies training;Neuromuscular education;Functional Mobility Training;Passive range of motion;Visual/perceptual remediation/compensation;Manual Therapy;Patient/family education    Plan  continue progress towards LTG's, functional use of Lt hand, scanning Lt side    Consulted and Agree with Plan of Care  Patient;Family member/caregiver    Family Member Consulted  Wife       Patient will benefit from skilled therapeutic intervention in order to improve the following deficits and impairments:  Decreased coordination, Decreased range of motion, Difficulty walking, Improper body mechanics, Decreased endurance, Decreased safety awareness, Decreased activity tolerance, Decreased knowledge of precautions, Impaired tone, Impaired UE functional use, Decreased knowledge of use of DME, Decreased balance, Decreased cognition, Decreased mobility, Decreased strength, Impaired perceived functional ability, Impaired vision/preception  Visit Diagnosis: Visuospatial deficit  Other lack of coordination  Hemiplegia and hemiparesis following cerebral infarction affecting left non-dominant side (HCC)  Unsteadiness on feet    Problem List Patient Active Problem List   Diagnosis Date Noted  . Foul smelling urine   . Leukocytosis   . Hyponatremia   . Acute right PCA stroke ([place]) 11/06/2017  . Benign essential HTN   . Coronary artery disease involving native coronary artery of native heart without angina pectoris   . Stage 3 chronic kidney disease ([place])   . Acute lower UTI   . Seasonal allergies   . Stroke ([place]) 11/04/2017  . HLD (hyperlipidemia) 11/04/2017  . Chronic diastolic CHF (congestive heart failure) ([place]) 11/04/2017  . Acute renal failure superimposed on stage 2 chronic kidney disease ([place]) 11/04/2017  . Acute  metabolic encephalopathy 17/98/1025  . UTI (urinary tract infection) 11/04/2017  . AKI (acute kidney injury) ([place])   . History of CVA with residual deficit   . Prediabetes   . Left hemiparesis ([place]) 03/19/2013  . Cerebral infarction ([place]) 03/19/2013  . Left leg weakness 03/19/2013  . CAD, ARTERY BYPASS GRAFT 09/30/2008  . 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 Borderline hyperglycemia 12/03/2007  . Other and unspecified hyperlipidemia 07/28/2007  . Essential hypertension 07/28/2007  . 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 Coronary atherosclerosis 07/28/2007  . BENIGN PROSTATIC HYPERTROPHY 07/28/2007  . COLONIC POLYPS, 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    Carey Bullocks, OTR/L 12/23/2017, 10:27 AM  [facility] [address] [place], [place], [address] Phone: [phone]   Fax:  [phone]  Name: Thomas [name]. MRN: [id number] Date of Birth: [birth year]

## 2017-12-23 NOTE — Progress Notes (Signed)
STROKE NEUROLOGY FOLLOW UP NOTE  NAME: Thomas [name]. DOB: 08/27/1940  REASON FOR VISIT: stroke follow up HISTORY FROM: wife and chart  Today we had the pleasure of seeing Thomas [name]. in follow-up at our [facility]rology [facility]. Pt was accompanied by wife.   History Summary [name]. is a 77 y.o. male with history of CHF, CKD, CAD s/p CABG 1999, HLD, HTN, TIA in 2003 and stroke in 2014 admitted on 11/03/17 for confusion and left visual field cut. No tPA given due to clear hypodensity on CT. MRI showed right PCA large, right cerebellum and right MCA punctate infarcts,   As well as remote lacunar infarct in cerebellum, BG and brainstem.  CT head and neck right P3 flow decreased, left ICA proximal string sign at bifurcation, bilateral ICA siphon progressive atherosclerosis with stenosis, and right V1 and V4 stenosis.  EF 55 to 60%.  LE venous Doppler no DVT.  TEE unremarkable, loop recorder placed.  LDL 80 and A1c 5.8.  Discharged on DAPT for 3 months and then Plavix alone as well as high dose Lipitor.  Also referred to [facility] for outpatient follow-up of asymptomatic left ICA high-grade stenosis.  Hx of stroke/TIA  TIA 2003  Stroke 03/2013 with left lower extremity weakness, MRI showed right CR, left frontal cortex, left occipital white matter infarcts, LDL 55, A1c 5.9, carotid Doppler negative, MRI showed right VA and bilateral ICA siphon stenosis.  Aspirin 325 changed to aspirin 325 and Plavix 75 dual antiplatelet.  Followed with Dr. Pearlean Brownie in clinic and on plavix   Interval History During the interval time, the patient has been doing well from a stroke standpoint.  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  Currently has outpatient PT/OT twice a week.  Still walk with walker.  Wife stated that patient had sleep-wake cycle disturbance, not sleep at night, taking naps during the day.  Sometimes has behavior changes, cursing to wife, agitated.  Still on aspirin and Plavix as well as  Lipitor without side effect.  BP today 117/66.  Loop recorder no A. fib so far.  REVIEW OF SYSTEMS: Full 14 system review of systems performed and notable only for those listed below and in HPI above, all others are negative:  Constitutional:   Cardiovascular:  Ear/Nose/Throat:   Skin:  Eyes:   Respiratory:   Gastroitestinal: Mild constipation Genitourinary:  Hematology/Lymphatic:   Endocrine:  Musculoskeletal:   Allergy/Immunology:   Neurological: Confusion, left leg weakness Psychiatric:  Sleep: Insomnia  The following represents the patient's updated allergies and side effects list: Allergies  Allergen Reactions  . Sulfonamide Derivatives     REACTION: Weak,lethargic    The neurologically relevant items on the patient's problem list were reviewed on today's visit.  Neurologic Examination  A problem focused neurological exam (12 or more points of the single system neurologic examination, vital signs counts as 1 point, cranial nerves count for 8 points) was performed.  Blood pressure 117/66, pulse 65, height 5\' 10"  (1.778 m), weight 166 lb (75.3 kg).  General - Well nourished, well developed, in no apparent distress.  Ophthalmologic - Fundi not visualized due to eye movement.  Cardiovascular - Regular rate and rhythm.  Mental Status -  Level of arousal and orientation to time, place, and person were intact. Language including expression, naming, repetition, comprehension was assessed and found intact. Fund of Knowledge was assessed and was intact.  Cranial Nerves II - XII - II - lef dense hemianopia.  III, IV, VI - Extraocular movements intact. V - Facial sensation intact bilaterally. VII - Facial movement intact bilaterally. VIII - Hearing & vestibular intact bilaterally. X - Palate elevates symmetrically. XI - Chin turning & shoulder shrug intact bilaterally. XII - Tongue protrusion intact.  Motor Strength - The patient's strength was normal in all  extremities and pronator drift was absent.  Bulk was normal and fasciculations were absent.   Motor Tone - Muscle tone was assessed at the neck and appendages and was normal.  Reflexes - The patient's reflexes were 1+ in all extremities and he had no pathological reflexes.  Sensory - Light touch, temperature/pinprick were assessed and were normal.    Coordination - The patient had normal movements in the hands with no ataxia or dysmetria.  Tremor was absent.  Gait and Station - difficulty getting out of chair, walk with walker, chronic left knee over extension on walking   Functional score  mRS = 3   0 - No symptoms.   1 - No significant disability. Able to carry out all usual activities, despite some symptoms.   2 - Slight disability. Able to look after own affairs without assistance, but unable to carry out all previous activities.   3 - Moderate disability. Requires some help, but able to walk unassisted.   4 - Moderately severe disability. Unable to attend to own bodily needs without assistance, and unable to walk unassisted.   5 - Severe disability. Requires constant nursing care and attention, bedridden, incontinent.   6 - Dead.   NIH Stroke Scale   Level Of Consciousness 0=Alert; keenly responsive 1=Not alert, but arousable by minor stimulation 2=Not alert, requires repeated stimulation 3=Responds only with reflex movements 0  LOC Questions to Month and Age 20=Answers both questions correctly 1=Answers one question correctly 2=Answers neither question correctly 0  LOC Commands      -Open/Close eyes     -Open/close grip 0=Performs both tasks correctly 1=Performs one task correctly 2=Performs neighter task correctly 0  Best Gaze 0=Normal 1=Partial gaze palsy 2=Forced deviation, or total gaze paresis 0  Visual 0=No visual loss 1=Partial hemianopia 2=Complete hemianopia 3=Bilateral hemianopia (blind including cortical blindness) 2  Facial Palsy 0=Normal  symmetrical movement 1=Minor paralysis (asymmetry) 2=Partial paralysis (lower face) 3=Complete paralysis (upper and lower face) 0  Motor  0=No drift, limb holds posture for full 10 seconds 1=Drift, limb holds posture, no drift to bed 2=Some antigravity effort, cannot maintain posture, drifts to bed 3=No effort against gravity, limb falls 4=No movement Right Arm 0     Leg 0    Left Arm 0     Leg 0  Limb Ataxia 0=Absent 1=Present in one limb 2=Present in two limbs 0  Sensory 0=Normal 1=Mild to moderate sensory loss 2=Severe to total sensory loss 0  Best Language 0=No aphasia, normal 1=Mild to moderate aphasia 2=Mute, global aphasia 3=Mute, global aphasia 0  Dysarthria 0=Normal 1=Mild to moderate 2=Severe, unintelligible or mute/anarthric 0  Extinction/Neglect 0=No abnormality 1=Extinction to bilateral simultaneous stimulation 2=Profound neglect 1  Total   3     Data reviewed: I personally reviewed the images and agree with the radiology interpretations.  Ct Angio Head W Or Wo Contrast  Result Date: 11/04/2017 CLINICAL DATA:  77 year old male with left hemianopia. Unknown time of symptom onset. EXAM: CT ANGIOGRAPHY HEAD AND NECK CT PERFUSION BRAIN TECHNIQUE: Multidetector CT imaging of the head and neck was performed using the standard protocol during bolus administration of intravenous contrast. Multiplanar CT  image reconstructions and MIPs were obtained to evaluate the vascular anatomy. Carotid stenosis measurements (when applicable) are obtained utilizing NASCET criteria, using the distal internal carotid diameter as the denominator. Multiphase CT imaging of the brain was performed following IV bolus contrast injection. Subsequent parametric perfusion maps were calculated using RAPID software. CONTRAST:  ISOVUE-370 IOPAMIDOL (ISOVUE-370) INJECTION 76% COMPARISON:  Head CT without contrast 0007 hours today. Brain MRI and MRA 03/19/2013 FINDINGS: CT Brain Perfusion  Findings: CBF (<30%) Volume: zero, but erroneous - see next. Perfusion (Tmax>6.0s) volume: 30 mL, which corresponds to the cytotoxic edema seen on the 0007 hours noncontrast head CT today. Therefore, this is infarct core. Mismatch Volume: Probably zero Infarction Location:Right PCA, right occipital lobe CTA NECK Skeleton: Prior sternotomy. No acute osseous abnormality identified. Upper chest: Mild dependent atelectasis. No superior mediastinal lymphadenopathy. Mild gas and fluid distension of the thoracic esophagus. Other neck: Negative.  No neck mass or lymphadenopathy. Aortic arch: Mild bovine type arch configuration. Calcified arch atherosclerosis. Right carotid system: Tortuous brachiocephalic artery and proximal right CCA with a kinked appearance but no atherosclerotic stenosis. Soft and calcified plaque in the distal right CCA proximal to the bifurcation without stenosis. Bulky calcified plaque at the right carotid bifurcation, but less than 50 % stenosis with respect to the distal vessel at the right ICA origin. Coarse calcified plaque continues into the distal bulb, but stenosis remains less than 50 % with respect to the distal vessel. Patent right ICA to the skull base. Left carotid system: No left CCA origin stenosis. Mildly tortuous proximal left CCA. Occasional plaque proximal to the left carotid bifurcation without stenosis. Bulky calcified plaque at the left carotid bifurcation with short segment radiographic string sign stenosis (series 10, image 112). The left ICA remains patent with no additional stenosis to the skull base despite some calcified plaque. Vertebral arteries: Tortuous proximal right subclavian artery without atherosclerotic stenosis. Right vertebral artery origin calcified plaque with moderate to severe stenosis. The right vertebral artery is non dominant with additional right V2 and V3 segment calcified plaque, but no other stenosis until the skull base. No proximal right subclavian  artery stenosis despite plaque. Calcified plaque at the left vertebral artery origin with moderate to severe stenosis. Dominant left vertebral artery with additional calcified plaque in the V2 segments but no other stenosis to the skull base. CTA HEAD Posterior circulation: Calcified plaque in the right vertebral artery V4 segment with moderate to severe stenosis as the vessel crosses the dura and again just proximal to the right PICA origin which remains patent. This appears progressed since 2014. The right vertebral is patent to the vertebrobasilar junction. The dominant distal left vertebral artery demonstrates calcified plaque with only mild stenosis. Normal left PICA origin. Patent basilar artery with stable irregularity since 2014 mostly in the proximal 3rd. Mild associated stenosis. Patent SCA and PCA origins. Posterior communicating arteries are diminutive or absent. The right P1 and P2 segments are patent with mild irregularity. There is poor flow in the right P3 segments. The left PCA is patent with mild irregularity. Anterior circulation: Both ICA siphons are patent with extensive calcified atherosclerosis. There is moderate to severe short segment supraclinoid right ICA stenosis which may have progressed since 2014 (series 11 image 78). The right ICA terminus remains patent. On the left there is moderate distal petrous segment stenosis due to calcified plaque. 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 There is moderate to severe anterior genu segment stenosis due to calcified  plaque, and additional severe left supraclinoid segment stenosis due to calcified plaque. These appear progressed since 2014. The left ICA terminus remains patent. MCA and ACA origins are patent. ACA origins and A1 segments appear normal. Bilateral ACA branches are within normal limits. There is calcified plaque at the left MCA origin but no stenosis. The left M1 segment, left trifurcation, and  left MCA branches are within normal limits. There is calcified plaque in the right MCA origin and M1 segment with only mild stenosis. The right MCA bifurcation and right MCA branches are patent with mild irregularity. No discrete right MCA branch occlusion is identified although there is decreased distal right MCA branch enhancement compared to the left side (series 12, image 13). Venous sinuses: Grossly patent, suboptimal timing for venous valuation. Anatomic variants: Dominant left vertebral artery. Mild bovine type arch configuration. Review of the MIP images confirms the above findings IMPRESSION: 1. Unreliable CBF results due to cytotoxic edema in the Right PCA territory. 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 This was discussed by telephone with Dr. Ritta Slot on 11/04/2017 at 1229 hours. 2. With regard to #1, there is poor flow in the right P3 divisions, but the right P1 and P2 segments remain patent. 3. Positive for severe atherosclerosis and multifocal other high-grade intra- and extracranial stenosis: - high-grade Left ICA origin radiographic string sign stenosis due to bulky calcified plaque. - moderate to severe bilateral ICA siphon stenosis appears progressed since 2014 and is due to calcified plaque, with multifocal high-grade stenoses on the Left . - moderate to severe stenosis of the non dominant Right Vertebral Artery at both its origin and V4 segments. 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 4.  Aortic Atherosclerosis (ICD10-I70.0). Electronically Signed   By: Odessa Fleming M.D.   On: 11/04/2017 00:52   Ct Angio Neck W Or Wo Contrast  Result Date: 11/04/2017 CLINICAL DATA:  77 year old male with left hemianopia. Unknown time of symptom onset. EXAM: CT ANGIOGRAPHY HEAD AND NECK CT PERFUSION BRAIN TECHNIQUE: Multidetector CT imaging of the head and neck was  performed using the standard protocol during bolus administration of intravenous contrast. Multiplanar CT image reconstructions and MIPs were obtained to evaluate the vascular anatomy. Carotid stenosis measurements (when applicable) are obtained utilizing NASCET criteria, using the distal internal carotid diameter as the denominator. Multiphase CT imaging of the brain was performed following IV bolus contrast injection. Subsequent parametric perfusion maps were calculated using RAPID software. CONTRAST:  ISOVUE-370 IOPAMIDOL (ISOVUE-370) INJECTION 76% COMPARISON:  Head CT without contrast 0007 hours today. Brain MRI and MRA 03/19/2013 FINDINGS: CT Brain Perfusion Findings: CBF (<30%) Volume: zero, but erroneous - see next. Perfusion (Tmax>6.0s) volume: 30 mL, which corresponds to the cytotoxic edema seen on the 0007 hours noncontrast head CT today. Therefore, this is infarct core. Mismatch Volume: Probably zero Infarction Location:Right PCA, right occipital lobe CTA NECK Skeleton: Prior sternotomy. No acute osseous abnormality identified. Upper chest: Mild dependent atelectasis. No superior mediastinal lymphadenopathy. Mild gas and fluid distension of the thoracic esophagus. Other neck: Negative.  No neck mass or lymphadenopathy. Aortic arch: Mild bovine type arch configuration. Calcified arch atherosclerosis. Right carotid system: Tortuous brachiocephalic artery and proximal right CCA with a kinked appearance but no atherosclerotic stenosis. Soft and calcified plaque in the distal right CCA proximal to the bifurcation without stenosis. Bulky calcified plaque at the right carotid  bifurcation, but less than 50 % stenosis with respect to the distal vessel at the right ICA origin. Coarse calcified plaque continues into the distal bulb, but stenosis remains less than 50 % with respect to the distal vessel. Patent right ICA to the skull base. Left carotid system: No left CCA origin stenosis. Mildly tortuous  proximal left CCA. Occasional plaque proximal to the left carotid bifurcation without stenosis. Bulky calcified plaque at the left carotid bifurcation with short segment radiographic string sign stenosis (series 10, image 112). The left ICA remains patent with no additional stenosis to the skull base despite some calcified plaque. Vertebral arteries: Tortuous proximal right subclavian artery without atherosclerotic stenosis. Right vertebral artery origin calcified plaque with moderate to severe stenosis. The right vertebral artery is non dominant with additional right V2 and V3 segment calcified plaque, but no other stenosis until the skull base. No proximal right subclavian artery stenosis despite plaque. Calcified plaque at the left vertebral artery origin with moderate to severe stenosis. Dominant left vertebral artery with additional calcified plaque in the V2 segments but no other stenosis to the skull base. CTA HEAD Posterior circulation: Calcified plaque in the right vertebral artery V4 segment with moderate to severe stenosis as the vessel crosses the dura and again just proximal to the right PICA origin which remains patent. This appears progressed since 2014. The right vertebral is patent to the vertebrobasilar junction. The dominant distal left vertebral artery demonstrates calcified plaque with only mild stenosis. Normal left PICA origin. Patent basilar artery with stable irregularity since 2014 mostly in the proximal 3rd. Mild associated stenosis. Patent SCA and PCA origins. Posterior communicating arteries are diminutive or absent. The right P1 and P2 segments are patent with mild irregularity. There is poor flow in the right P3 segments. The left PCA is patent with mild irregularity. Anterior circulation: Both ICA siphons are patent with extensive calcified atherosclerosis. There is moderate to severe short segment supraclinoid right ICA stenosis which may have progressed since 2014 (series 11 image  78). The right ICA terminus remains patent. On the left there is moderate distal petrous segment stenosis due to calcified plaque. 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 There is moderate to severe anterior genu segment stenosis due to calcified plaque, and additional severe left supraclinoid segment stenosis due to calcified plaque. These appear progressed since 2014. The left ICA terminus remains patent. MCA and ACA origins are patent. ACA origins and A1 segments appear normal. Bilateral ACA branches are within normal limits. There is calcified plaque at the left MCA origin but no stenosis. The left M1 segment, left trifurcation, and left MCA branches are within normal limits. There is calcified plaque in the right MCA origin and M1 segment with only mild stenosis. The right MCA bifurcation and right MCA branches are patent with mild irregularity. No discrete right MCA branch occlusion is identified although there is decreased distal right MCA branch enhancement compared to the left side (series 12, image 13). Venous sinuses: Grossly patent, suboptimal timing for venous valuation. Anatomic variants: Dominant left vertebral artery. Mild bovine type arch configuration. Review of the MIP images confirms the above findings IMPRESSION: 1. Unreliable CBF results due to cytotoxic edema in the Right PCA territory. 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 This was discussed by telephone with Dr. Ritta Slot on 11/04/2017 at 1229 hours. 2. With regard to #  1, there is poor flow in the right P3 divisions, but the right P1 and P2 segments remain patent. 3. Positive for severe atherosclerosis and multifocal other high-grade intra- and extracranial stenosis: - high-grade Left ICA origin radiographic string sign stenosis due to bulky calcified plaque. - moderate to severe bilateral  ICA siphon stenosis appears progressed since 2014 and is due to calcified plaque, with multifocal high-grade stenoses on the Left . - moderate to severe stenosis of the non dominant Right Vertebral Artery at both its origin and V4 segments. 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 4.  Aortic Atherosclerosis (ICD10-I70.0). Electronically Signed   By: Odessa Fleming M.D.   On: 11/04/2017 00:52   Mr Brain Wo Contrast  Result Date: 11/04/2017 CLINICAL DATA:  77 y/o  M; evaluation of stroke. EXAM: MRI HEAD WITHOUT CONTRAST TECHNIQUE: Multiplanar, multiecho pulse sequences of the brain and surrounding structures were obtained without intravenous contrast. COMPARISON:  10/07/2017 CT head, CTA head, CT perfusion head. 03/19/2013 MRI head. FINDINGS: Brain: Right PCA distribution area of reduced diffusion involving the medial temporal lobe and occipital lobe compatible with acute/early subacute infarction. Few punctate foci of infarction are present in right cerebellum, right splenium of corpus callosum and right parietal lobe. Punctate foci of susceptibility hypointensity within right posteromedial temporal lobe (series 12,001 image 23) compatible with petechial hemorrhage. Extensive confluentnonspecific foci of T2 FLAIR hyperintense signal abnormality in subcortical and periventricular white matter are compatible withseverechronic microvascular ischemic changes for age. Moderatebrain parenchymal volume loss. Small chronic infarcts are present within the bilateral cerebellar hemispheres, pons, left thalamus, and right posterior limb of internal capsule. No extra-axial collection, effacement of basilar cisterns, or significant mass effect. Vascular: Normal central flow voids. Skull and upper cervical spine: Normal marrow signal. Sinuses/Orbits: Moderate diffuse paranasal sinus mucosal thickening with small fluid levels in the maxillary sinus. Trace opacification of left mastoid air cells. Bilateral  intra-ocular lens replacement. Other: None. IMPRESSION: 1. Right PCA distribution acute/early subacute infarction involving medial temporal lobe and occipital lobe. Punctate foci of infarction in right cerebellum, splenium of corpus callosum, and parietal lobe. Small area of petechial hemorrhage in right posteromedial temporal lobe. No significant mass effect. 2. Severe chronic microvascular ischemic changes and moderate parenchymal volume loss of the brain. 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 3. Moderate paranasal sinus disease with fluid levels which may represent acute sinusitis. Electronically Signed   By: Mitzi Hansen M.D.   On: 11/04/2017 15:43   Ct Cerebral Perfusion W Contrast  Result Date: 11/04/2017 CLINICAL DATA:  77 year old male with left hemianopia. Unknown time of symptom onset. EXAM: CT ANGIOGRAPHY HEAD AND NECK CT PERFUSION BRAIN TECHNIQUE: Multidetector CT imaging of the head and neck was performed using the standard protocol during bolus administration of intravenous contrast. Multiplanar CT image reconstructions and MIPs were obtained to evaluate the vascular anatomy. Carotid stenosis measurements (when applicable) are obtained utilizing NASCET criteria, using the distal internal carotid diameter as the denominator. Multiphase CT imaging of the brain was performed following IV bolus contrast injection. Subsequent parametric perfusion maps were calculated using RAPID software. CONTRAST:  ISOVUE-370 IOPAMIDOL (ISOVUE-370) INJECTION 76% COMPARISON:  Head CT without contrast 0007 hours today. Brain MRI and MRA 03/19/2013 FINDINGS: CT Brain Perfusion Findings: CBF (<30%) Volume: zero, but erroneous - see next. Perfusion (Tmax>6.0s) volume: 30 mL, which corresponds to the cytotoxic edema seen on the 0007 hours noncontrast head CT today. Therefore, this is infarct core. Mismatch Volume: Probably zero  Infarction Location:Right PCA, right occipital  lobe CTA NECK Skeleton: Prior sternotomy. No acute osseous abnormality identified. Upper chest: Mild dependent atelectasis. No superior mediastinal lymphadenopathy. Mild gas and fluid distension of the thoracic esophagus. Other neck: Negative.  No neck mass or lymphadenopathy. Aortic arch: Mild bovine type arch configuration. Calcified arch atherosclerosis. Right carotid system: Tortuous brachiocephalic artery and proximal right CCA with a kinked appearance but no atherosclerotic stenosis. Soft and calcified plaque in the distal right CCA proximal to the bifurcation without stenosis. Bulky calcified plaque at the right carotid bifurcation, but less than 50 % stenosis with respect to the distal vessel at the right ICA origin. Coarse calcified plaque continues into the distal bulb, but stenosis remains less than 50 % with respect to the distal vessel. Patent right ICA to the skull base. Left carotid system: No left CCA origin stenosis. Mildly tortuous proximal left CCA. Occasional plaque proximal to the left carotid bifurcation without stenosis. Bulky calcified plaque at the left carotid bifurcation with short segment radiographic string sign stenosis (series 10, image 112). The left ICA remains patent with no additional stenosis to the skull base despite some calcified plaque. Vertebral arteries: Tortuous proximal right subclavian artery without atherosclerotic stenosis. Right vertebral artery origin calcified plaque with moderate to severe stenosis. The right vertebral artery is non dominant with additional right V2 and V3 segment calcified plaque, but no other stenosis until the skull base. No proximal right subclavian artery stenosis despite plaque. Calcified plaque at the left vertebral artery origin with moderate to severe stenosis. Dominant left vertebral artery with additional calcified plaque in the V2 segments but no other stenosis to the skull base. CTA HEAD Posterior circulation: Calcified plaque in the  right vertebral artery V4 segment with moderate to severe stenosis as the vessel crosses the dura and again just proximal to the right PICA origin which remains patent. This appears progressed since 2014. The right vertebral is patent to the vertebrobasilar junction. The dominant distal left vertebral artery demonstrates calcified plaque with only mild stenosis. Normal left PICA origin. Patent basilar artery with stable irregularity since 2014 mostly in the proximal 3rd. Mild associated stenosis. Patent SCA and PCA origins. Posterior communicating arteries are diminutive or absent. The right P1 and P2 segments are patent with mild irregularity. There is poor flow in the right P3 segments. The left PCA is patent with mild irregularity. Anterior circulation: Both ICA siphons are patent with extensive calcified atherosclerosis. There is moderate to severe short segment supraclinoid right ICA stenosis which may have progressed since 2014 (series 11 image 78). The right ICA terminus remains patent. On the left there is moderate distal petrous segment stenosis due to calcified plaque. 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 There is moderate to severe anterior genu segment stenosis due to calcified plaque, and additional severe left supraclinoid segment stenosis due to calcified plaque. These appear progressed since 2014. The left ICA terminus remains patent. MCA and ACA origins are patent. ACA origins and A1 segments appear normal. Bilateral ACA branches are within normal limits. There is calcified plaque at the left MCA origin but no stenosis. The left M1 segment, left trifurcation, and left MCA branches are within normal limits. There is calcified plaque in the right MCA origin and M1 segment with only mild stenosis. The right MCA bifurcation and right MCA branches are patent with mild irregularity. No discrete right MCA branch occlusion is identified although there is decreased  distal right MCA branch enhancement compared  to the left side (series 12, image 13). Venous sinuses: Grossly patent, suboptimal timing for venous valuation. Anatomic variants: Dominant left vertebral artery. Mild bovine type arch configuration. Review of the MIP images confirms the above findings IMPRESSION: 1. Unreliable CBF results due to cytotoxic edema in the Right PCA territory. 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 This was discussed by telephone with Dr. Ritta Slot on 11/04/2017 at 1229 hours. 2. With regard to #1, there is poor flow in the right P3 divisions, but the right P1 and P2 segments remain patent. 3. Positive for severe atherosclerosis and multifocal other high-grade intra- and extracranial stenosis: - high-grade Left ICA origin radiographic string sign stenosis due to bulky calcified plaque. - moderate to severe bilateral ICA siphon stenosis appears progressed since 2014 and is due to calcified plaque, with multifocal high-grade stenoses on the Left . - moderate to severe stenosis of the non dominant Right Vertebral Artery at both its origin and V4 segments. 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 4.  Aortic Atherosclerosis (ICD10-I70.0). Electronically Signed   By: Odessa Fleming M.D.   On: 11/04/2017 00:52   Ct Head Code Stroke Wo Contrast  Result Date: 11/04/2017 CLINICAL DATA:  Code stroke. 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 Last known normal 1900 hours. EXAM: CT HEAD WITHOUT CONTRAST TECHNIQUE: Contiguous axial images were obtained from the base of the skull through the vertex without intravenous contrast. COMPARISON:  Brain MRI, intracranial MRA, and noncontrast head CT 03/19/2013 FINDINGS: Brain: Similar cerebral volume since 2014. 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 No  cortical encephalomalacia identified, but there is abnormal hypodensity in the medial right occipital lobe on series 3, image 15 and coronal image 47. No associated hemorrhage or mass effect. No other cytotoxic edema identified. No midline shift, ventriculomegaly, mass effect, evidence of mass lesion, or acute intracranial hemorrhage. Vascular: Extensive calcified atherosclerosis at the skull base. Questionable abnormal hyperdensity of the right PCA on series 5, image 42. Skull: No acute osseous abnormality identified. Sinuses/Orbits: Widespread paranasal sinus mucosal thickening and fluid levels are largely new since 2014. The bilateral tympanic cavities and mastoids remain clear. Other: No acute orbit or scalp soft tissue findings. Orbits soft tissues appears stable since 2014. ASPECTS [facility] Stroke Program Early CT Score) - Ganglionic level infarction (caudate, lentiform nuclei, internal capsule, insula, M1-M3 cortex): 7 - Supraganglionic infarction (M4-M6 cortex): 3 Total score (0-10 with 10 being normal): 10 IMPRESSION: 1. 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 2. Suspicion of hyperdense right PCA indicating occlusion. 3. Underlying advanced chronic small vessel disease. 4. These results were communicated to Dr. Amada Jupiter at 12:22 amon 3/25/2019by text page via the [facility] messaging system. Electronically Signed   By: Odessa Fleming M.D.   On: 11/04/2017 00:23    LE venous doppler  Right: Ultrasound characteristics of enlarged lymph nodes are noted in the groin. There is no evidence of deep vein thrombosis in the lower extremity. No cystic structure found in the popliteal fossa. Left: There is no evidence of deep vein thrombosis in the lower extremity. No cystic structure found in the popliteal fossa.  TTE 10/14/17 - Left ventricle: The cavity size was normal. Wall thickness was increased in a pattern of severe LVH. Systolic  function was normal. The estimated  ejection fraction was in the range of 55% to 60%. - Aortic valve: There was mild stenosis. There was mild regurgitation. Valve area (VTI): 2.23 cm^2. Valve area (Vmax): 1.55 cm^2. Valve area (Vmean): 1.53 cm^2. - Left atrium: The atrium was moderately dilated. - Atrial septum: No defect or patent foramen ovale was identified.  TEE  - Left ventricle: Systolic function was normal. The estimated   ejection fraction was in the range of 55% to 60%. Wall motion was   normal; there were no regional wall motion abnormalities. - Aorta: There was moderate non-mobile atheroma. - Mitral valve: Mildly calcified annulus. Mildly thickened leaflets   . There was mild regurgitation. - Left atrium: No evidence of thrombus in the atrial cavity or   appendage. No evidence of thrombus in the atrial cavity or   appendage. - Right atrium: No evidence of thrombus in the atrial cavity or   appendage. - Atrial septum: There was increased thickness of the septum,   consistent with lipomatous hypertrophy. No defect or patent   foramen ovale was identified.  Component     Latest Ref Rng & Units 11/04/2017  Cholesterol     0 - 200 mg/dL 948  Triglycerides     <150 mg/dL 80  HDL Cholesterol     >40 mg/dL 42  Total CHOL/HDL Ratio     RATIO 3.3  VLDL     0 - 40 mg/dL 16  LDL (calc)     0 - 99 mg/dL 80  Hemoglobin A1K     4.8 - 5.6 % 5.8 (H)  Mean Plasma Glucose     mg/dL 553.74  B Natriuretic Peptide     0.0 - 100.0 pg/mL 202.7 (H)    Assessment: As you may recall, he is a 77 y.o. Caucasian male with PMH of CHF, CKD, CAD s/p CABG 1999, HLD, HTN, TIA in 2003 and stroke in 2014 admitted on 11/03/17 for left hemianopia. MRI showed right PCA large, right cerebellum and right MCA punctate infarcts, as well as remote lacunar infarct in cerebellum, BG and brainstem.  CT head and neck right P3 flow decreased, left ICA proximal string sign at bifurcation, bilateral ICA  siphon progressive atherosclerosis with stenosis, and right V1 and V4 stenosis.  EF 55 to 60%.  LE venous Doppler no DVT.  TEE unremarkable, loop recorder placed.  LDL 80 and A1c 5.8.  Discharged on DAPT for 3 months and then Plavix alone as well as high dose Lipitor.  Also referred to [facility] for outpatient follow-up of asymptomatic left ICA high-grade stenosis.  Currently, patient still has dense left hemianopia, not driving.  Wife complain of sleep-wake cycle disturbance, behavior disturbance and insomnia.  Hx of stroke/TIA  TIA 2003  Stroke 03/2013 with left lower extremity weakness, MRI showed right CR, left frontal cortex, left occipital white matter infarcts, LDL 55, A1c 5.9, carotid Doppler negative, MRI showed right VA and bilateral ICA siphon stenosis.  Aspirin 325 changed to aspirin 325 and Plavix 75 dual antiplatelet.  Followed with Dr. Pearlean Brownie in clinic and on plavix   Plan:  - continue ASA and plavix for another 6 weeks and then plavix alone - continue lipitor for stroke prevention - Follow up with your primary care physician for stroke risk factor modification. Recommend maintain blood pressure goal <130/80, diabetes with hemoglobin A1c goal below 7.0% and lipids with LDL cholesterol goal below 70 mg/dL.  - continue PT/OT  - Continue to follow up with loop recorder - follow  up with Dr. Randie Heinz in [facility] to monitor left carotid artery narrowing - will prescribe seroquel low dose at night for insomnia and behavior change - follow up with Dr. Pearlean Brownie in 2 months.   I spent more than 25 minutes of face to face time with the patient. Greater than 50% of time was spent in counseling and coordination of care. We discussed possible vascular dementia, no driving due to left hemianopia, follow-up with vascular surgery.   No orders of the defined types were placed in this encounter.   Meds ordered this encounter  Medications  . QUEtiapine (SEROQUEL) 25 MG tablet    Sig: Take 0.5 tablets (12.5 mg  total) by mouth at bedtime.    Dispense:  30 tablet    Refill:  1    Patient Instructions  - continue ASA and plavix for another 6 weeks and then plavix alone - continue lipitor for stroke prevention - Follow up with your primary care physician for stroke risk factor modification. Recommend maintain blood pressure goal <130/80, diabetes with hemoglobin A1c goal below 7.0% and lipids with LDL cholesterol goal below 70 mg/dL.  - continue PT/OT  - walk with walker and avoid fall - follow up with loop recorder - follow up with Dr. Randie Heinz in vascular surgery to monitor left carotid artery narrowing - will prescribe seroquel low dose at night for insomnia and behavior change - follow up with Dr. Pearlean Brownie in 2 months.    Marvel Plan, MD PhD [facility] [address] [place], [place] [address] [phone]

## 2017-12-23 NOTE — Therapy (Signed)
[place] [facility] [address] [place], [place], [address] Phone: [phone]   Fax:  [phone]  Physical Therapy Treatment  Patient Details  Name: Thomas [name]. MRN: [id number] Date of Birth: October 18, 1940 Referring Provider: Alger Simons, MD   Encounter Date: 12/23/2017  PT End of Session - 12/23/17 1312    Visit Number  6    Number of Visits  17 eval + 2x/week for 8 weeks    Date for PT Re-Evaluation  01/27/18    Authorization Type  [employer] medicare     PT Start Time  1016    PT Stop Time  1100    PT Time Calculation (min)  44 min    Equipment Utilized During Treatment  Gait belt    Activity Tolerance  Patient tolerated treatment well    Behavior During Therapy  WFL for tasks assessed/performed       Past Medical History:  Diagnosis Date  . CHF (congestive heart failure) ([place]) 1999  . CKD (chronic kidney disease), stage II   . Coronary artery disease   . Hx of colonic polyps   . Hyperlipidemia   . Hyperplastic colon polyp 04/15/2007  . Hypertension   . Prostatic hypertrophy, benign    with elevated PSA  . TIA (transient ischemic attack) 2003   "mini stroke" (03/19/2013)    Past Surgical History:  Procedure Laterality Date  . CARDIAC CATHETERIZATION    . CATARACT EXTRACTION W/ INTRAOCULAR LENS  IMPLANT, BILATERAL Bilateral 1998  . COLONOSCOPY  2008  . CORONARY ARTERY BYPASS GRAFT  1999   x4  . LOOP RECORDER INSERTION N/A 11/05/2017   Procedure: LOOP RECORDER INSERTION;  Surgeon: Thompson Grayer, MD;  Location: [facility];  Service: Cardiovascular;  Laterality: N/A;  . TEE WITHOUT CARDIOVERSION N/A 11/05/2017   Procedure: TRANSESOPHAGEAL ECHOCARDIOGRAM (TEE) WITH LOOP;  Surgeon: Dorothy Spark, MD;  Location: [facility] ENDOSCOPY;  Service: Cardiovascular;  Laterality: N/A;    There were no vitals filed for this visit.  Subjective Assessment - 12/23/17 1018    Subjective  No changes since last visit,  no pain.     Pertinent History  CKD, prior CVA using RW prior to recent admission, HTN, CHF.    Patient Stated Goals  Moving "a whole lot easier than I am now."  Wife notes she wants him to get stronger and get back to where he can have a little more independence.    Currently in Pain?  No/denies         [facility] PT Assessment - 12/23/17 1029      Transfers   Comments  Pt able to perform 5 times to RW at mod I level.  Good recall of hand placement and safety.  Met STG.       Ambulation/Gait   Ambulation Distance (Feet)  300 Feet and another 125' with RW    Gait Pattern  Step-through pattern;Decreased step length - right;Decreased step length - left;Left genu recurvatum;Decreased trunk rotation;Decreased weight shift to left    Ambulation Surface  Level;Indoor    Gait velocity  1.23 ft/sec with RW and CGA                   [facility] Adult PT Treatment/Exercise - 12/23/17 1029      Transfers   Transfers  Sit to Stand;Stand to Sit    Sit to Stand  6: Modified independent (Device/Increase time)    Stand to Sit  6: Modified independent (Device/Increase time)      Ambulation/Gait   Ambulation/Gait  Yes    Ambulation/Gait Assistance  4: Min guard    Ambulation/Gait Assistance Details  Pt able to ambulate x 300' with RW at [facility] on level indoor surfaces with cues for scanning L environment, esp in busy areas and also for upright posture.     Assistive device  Rolling walker      Neuro Re-ed    Neuro Re-ed Details   Reviewed HEP for LLE NMR/strength.  Pt with decent recall of exercises once prompted or put into position by PT.  Did add hip add with bridging to encourage improved L LE alignment with task.  Performed x 10 reps total.  See pt instruction for remaining exercises performed and reps.       Exercises   Exercises  Other Exercises    Other Exercises   Seated hamstring stretch x 1 min on each side and seated gastroc stretch with strap x 45 secs on each side.  Min cues for  technqiue.                PT Short Term Goals - 12/23/17 1019      PT SHORT TERM GOAL #1   Title  The patient will be indep with HEP for LE strengthening, posture, stretching and general mobility.    Baseline  met 12/23/17    Time  4    Period  Weeks    Status  Achieved      PT SHORT TERM GOAL #2   Title  The patient will improve Berg balance score from 7/56 to > or equal to 16/56 to demonstrate improving independence for standing balance and transfers.    Baseline  Improved to 19/56.    Time  4    Period  Weeks    Status  Achieved      PT SHORT TERM GOAL #3   Title  The patient will move sit<>stand with UE support 5/5 trials mod indep to RW.    Baseline  moderate to max progressed to mod I to RW on 12/23/17    Time  4    Period  Weeks    Status  Achieved      PT SHORT TERM GOAL #4   Title  The patient will be further assessed on gait speed and LTG to follow.    Baseline  1.23 ft/sec with RW at [facility]     Time  4    Period  Weeks    Status  Achieved      PT SHORT TERM GOAL #5   Title  The patient will ambulate x 200 ft nonstop with RW and CGA to demonstrate improving endurance and dec'd caregiver assist.    Baseline  pt able to ambulate 300' nonstop at [facility] on 12/23/17    Time  4    Period  Weeks    Status  Achieved        PT Long Term Goals - 12/23/17 1037      PT LONG TERM GOAL #1   Title  The patient will improve functional status score from 21% to > or equal to 35% to demo improved perception of functional abiltiies.    Time  8    Period  Weeks      PT LONG TERM GOAL #2   Title  The patient will improve gait speed *baseline to be established after eval.  Time  8    Period  Weeks      PT LONG TERM GOAL #3   Title  The patient will improve Berg score from 7/56 to > or equal to 22/56 to demo improving standing balance for ADLs/functional tasks.    Time  8    Period  Weeks      PT LONG TERM GOAL #4   Title  The patient will negotiate 4 steps with one  HR with supervision for safe entrance/exit from home.    Time  8    Period  Weeks      PT LONG TERM GOAL #5   Title  The patient will ambulate x 350 ft with RW mod indep for improved household and short distance community negotiation to reduce caregiver assist.    Time  8    Period  Weeks      Additional Long Term Goals   Additional Long Term Goals  Yes      PT LONG TERM GOAL #6   Title  The patient will move floor<>stand with UE support and min A due to h/o falls.    Time  8    Period  Weeks      PT LONG TERM GOAL #7   Title  Pt will improve gait speed to 1.83 ft/sec w/ LRAD at S level in order to indicate decreased fall risk.     Time  8    Period  Weeks    Status  New            Plan - 12/23/17 1314    Clinical Impression Statement  Skilled session assessed remaining STGs.  Pt has met 5/5 STGs and making good progress towards LTGs.  Added LTG for gait speed as his current gait speed is 1.23 ft/sec demonstrating high fall risk.      PT Treatment/Interventions  ADLs/Self Care Home Management;Gait training;Stair training;Functional mobility training;Therapeutic activities;Therapeutic exercise;Neuromuscular re-education;Balance training;Patient/family education;Orthotic Fit/Training;Manual techniques    PT Next Visit Plan  check HEP periodically , seated postural strengthening, standing balance in clinic, gait training emphasizing endurance and L visual scanning to avoid obstacles    Consulted and Agree with Plan of Care  Patient       Patient will benefit from skilled therapeutic intervention in order to improve the following deficits and impairments:  Abnormal gait, Decreased endurance, Decreased activity tolerance, Decreased strength, Decreased safety awareness, Impaired flexibility, Postural dysfunction, Decreased balance, Decreased mobility  Visit Diagnosis: Unsteadiness on feet  Other abnormalities of gait and mobility  Muscle weakness (generalized)  Abnormal  posture     Problem List Patient Active Problem List   Diagnosis Date Noted  . Foul smelling urine   . Leukocytosis   . Hyponatremia   . Acute right PCA stroke ([place]) 11/06/2017  . Benign essential HTN   . Coronary artery disease involving native coronary artery of native heart without angina pectoris   . Stage 3 chronic kidney disease ([place])   . Acute lower UTI   . Seasonal allergies   . Stroke ([place]) 11/04/2017  . HLD (hyperlipidemia) 11/04/2017  . Chronic diastolic CHF (congestive heart failure) ([place]) 11/04/2017  . Acute renal failure superimposed on stage 2 chronic kidney disease ([place]) 11/04/2017  . Acute metabolic encephalopathy 37/05/6268  . UTI (urinary tract infection) 11/04/2017  . AKI (acute kidney injury) ([place])   . History of CVA with residual deficit   . Prediabetes   . Left hemiparesis ([place]) 03/19/2013  .  Cerebral infarction ([place]) 03/19/2013  . Left leg weakness 03/19/2013  . CAD, ARTERY BYPASS GRAFT 09/30/2008  . 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 Borderline hyperglycemia 12/03/2007  . Other and unspecified hyperlipidemia 07/28/2007  . Essential hypertension 07/28/2007  . 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 Coronary atherosclerosis 07/28/2007  . BENIGN PROSTATIC HYPERTROPHY 07/28/2007  . COLONIC POLYPS, 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   Cameron Sprang, PT, MPT [facility] [address] [place], [place], [address] Phone: [phone]   Fax:  [phone] 12/23/17, 1:15 PM  Name: Thomas [name]. MRN: [id number] Date of Birth: Jul 25, 1941

## 2017-12-23 NOTE — Patient Instructions (Addendum)
-   continue ASA and plavix for another 6 weeks and then plavix alone - continue lipitor for stroke prevention - Follow up with your primary care physician for stroke risk factor modification. Recommend maintain blood pressure goal <130/80, diabetes with hemoglobin A1c goal below 7.0% and lipids with LDL cholesterol goal below 70 mg/dL.  - continue PT/OT  - walk with walker and avoid fall - follow up with loop recorder - follow up with Dr. Randie Heinz in vascular surgery to monitor left carotid artery narrowing - will prescribe seroquel low dose at night for insomnia and behavior change - follow up with Dr. Pearlean Brownie in 2 months.

## 2017-12-24 DIAGNOSIS — F0151 Vascular dementia with behavioral disturbance: Secondary | ICD-10-CM | POA: Insufficient documentation

## 2017-12-24 DIAGNOSIS — F01518 Vascular dementia, unspecified severity, with other behavioral disturbance: Secondary | ICD-10-CM | POA: Insufficient documentation

## 2017-12-26 ENCOUNTER — Ambulatory Visit: Payer: Medicare Other | Admitting: Rehabilitative and Restorative Service Providers"

## 2017-12-26 ENCOUNTER — Encounter: Payer: Self-pay | Admitting: Rehabilitative and Restorative Service Providers"

## 2017-12-26 ENCOUNTER — Ambulatory Visit: Payer: Medicare Other | Admitting: Occupational Therapy

## 2017-12-26 DIAGNOSIS — R2681 Unsteadiness on feet: Secondary | ICD-10-CM

## 2017-12-26 DIAGNOSIS — I69318 Other symptoms and signs involving cognitive functions following cerebral infarction: Secondary | ICD-10-CM | POA: Diagnosis not present

## 2017-12-26 DIAGNOSIS — M6281 Muscle weakness (generalized): Secondary | ICD-10-CM

## 2017-12-26 DIAGNOSIS — R278 Other lack of coordination: Secondary | ICD-10-CM

## 2017-12-26 DIAGNOSIS — R293 Abnormal posture: Secondary | ICD-10-CM

## 2017-12-26 DIAGNOSIS — R2689 Other abnormalities of gait and mobility: Secondary | ICD-10-CM

## 2017-12-26 DIAGNOSIS — R41842 Visuospatial deficit: Secondary | ICD-10-CM

## 2017-12-26 DIAGNOSIS — I69354 Hemiplegia and hemiparesis following cerebral infarction affecting left non-dominant side: Secondary | ICD-10-CM

## 2017-12-26 NOTE — Therapy (Signed)
[facility] [address] [place], [place], [address] Phone: [phone]   Fax:  [phone]  Physical Therapy Treatment  Patient Details  Name: Thomas [name]. MRN: [id number] Date of Birth: 1940/09/29 Referring Provider: Alger Simons, MD   Encounter Date: 12/26/2017  PT End of Session - 12/26/17 0938    Visit Number  7    Number of Visits  17 eval + 2x/week for 8 weeks    Date for PT Re-Evaluation  01/27/18    Authorization Type  UHC medicare     PT Start Time  0930    PT Stop Time  1014    PT Time Calculation (min)  44 min    Equipment Utilized During Treatment  Gait belt    Activity Tolerance  Patient tolerated treatment well    Behavior During Therapy  WFL for tasks assessed/performed       Past Medical History:  Diagnosis Date  . CHF (congestive heart failure) ([place]) 1999  . CKD (chronic kidney disease), stage II   . Coronary artery disease   . Hx of colonic polyps   . Hyperlipidemia   . Hyperplastic colon polyp 04/15/2007  . Hypertension   . Prostatic hypertrophy, benign    with elevated PSA  . Stroke ([place])   . TIA (transient ischemic attack) 2003   "mini stroke" (03/19/2013)    Past Surgical History:  Procedure Laterality Date  . CARDIAC CATHETERIZATION    . CATARACT EXTRACTION W/ INTRAOCULAR LENS  IMPLANT, BILATERAL Bilateral 1998  . COLONOSCOPY  2008  . CORONARY ARTERY BYPASS GRAFT  1999   x4  . LOOP RECORDER INSERTION N/A 11/05/2017   Procedure: LOOP RECORDER INSERTION;  Surgeon: Thompson Grayer, MD;  Location: [facility];  Service: Cardiovascular;  Laterality: N/A;  . TEE WITHOUT CARDIOVERSION N/A 11/05/2017   Procedure: TRANSESOPHAGEAL ECHOCARDIOGRAM (TEE) WITH LOOP;  Surgeon: Dorothy Spark, MD;  Location: [facility] ENDOSCOPY;  Service: Cardiovascular;  Laterality: N/A;    There were no vitals filed for this visit.  Subjective Assessment - 12/26/17 0937    Subjective  Patient is  doing okay with exercises at home and notes no falls and walking better.    Patient is accompained by:  Family member wife in lobby upon patient coming into clinic    Pertinent History  CKD, prior CVA using RW prior to recent admission, HTN, CHF.    Patient Stated Goals  Moving "a whole lot easier than I am now."  Wife notes she wants him to get stronger and get back to where he can have a little more independence.    Currently in Pain?  No/denies                       [facility] Adult PT Treatment/Exercise - 12/26/17 0954      Ambulation/Gait   Ambulation/Gait  Yes    Ambulation/Gait Assistance  4: Min guard    Ambulation/Gait Assistance Details  Patient ambulated 230 feet with RW and CGA with cues to scan to left in clinic to avoid obstacles.  Ambulated 50 ft x 3 reps with RW and CGA.  Walking through obstacles for improved scanning of environment during mobility with verbal cues and CGA.   Then ambulated x 250 feet with RW with CGA and visual cues for visual scanning in between chairs that are close together and down hallways discussing stopping to locate walls before going into hallway.  Assistive device  Rolling walker    Gait Pattern  Step-through pattern;Decreased step length - right;Decreased step length - left;Left genu recurvatum;Decreased trunk rotation;Decreased weight shift to left    Ambulation Surface  Level;Indoor      Neuro Re-ed    Neuro Re-ed Details   Standing balance working o nL visual field scanning near a corner with therapist holding a card that patient touches by reaching across midline with CGA for safety.  The patient performed standing turns around cones/obstacles with cues for visual scanning.  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                PT Short Term Goals - 12/23/17 1019      PT SHORT TERM GOAL #1   Title  The patient will be indep with HEP for LE strengthening, posture, stretching and general  mobility.    Baseline  met 12/23/17    Time  4    Period  Weeks    Status  Achieved      PT SHORT TERM GOAL #2   Title  The patient will improve Berg balance score from 7/56 to > or equal to 16/56 to demonstrate improving independence for standing balance and transfers.    Baseline  Improved to 19/56.    Time  4    Period  Weeks    Status  Achieved      PT SHORT TERM GOAL #3   Title  The patient will move sit<>stand with UE support 5/5 trials mod indep to RW.    Baseline  moderate to max progressed to mod I to RW on 12/23/17    Time  4    Period  Weeks    Status  Achieved      PT SHORT TERM GOAL #4   Title  The patient will be further assessed on gait speed and LTG to follow.    Baseline  1.23 ft/sec with RW at [facility]     Time  4    Period  Weeks    Status  Achieved      PT SHORT TERM GOAL #5   Title  The patient will ambulate x 200 ft nonstop with RW and CGA to demonstrate improving endurance and dec'd caregiver assist.    Baseline  pt able to ambulate 300' nonstop at [facility] on 12/23/17    Time  4    Period  Weeks    Status  Achieved        PT Long Term Goals - 12/26/17 1013      PT LONG TERM GOAL #1   Title  The patient will improve functional status score from 21% to > or equal to 35% to demo improved perception of functional abiltiies.    Time  8    Period  Weeks    Target Date  01/27/18      PT LONG TERM GOAL #2   Title  The patient will improve gait speed *baseline to be established after eval.    Time  8    Period  Weeks      PT LONG TERM GOAL #3   Title  The patient will improve Berg score from 7/56 to > or equal to 22/56 to demo improving standing balance for ADLs/functional tasks.    Time  8    Period  Weeks      PT LONG TERM GOAL #4   Title  The patient will negotiate 4 steps with one HR with supervision for safe entrance/exit from home.    Time  8    Period  Weeks      PT LONG TERM GOAL #5   Title  The patient will ambulate x 350 ft with RW mod indep  for improved household and short distance community negotiation to reduce caregiver assist.    Time  8    Period  Weeks      PT LONG TERM GOAL #6   Title  The patient will move floor<>stand with UE support and min A due to h/o falls.    Time  8    Period  Weeks      PT LONG TERM GOAL #7   Title  Pt will improve gait speed to 1.83 ft/sec w/ LRAD at S level in order to indicate decreased fall risk.     Time  8    Period  Weeks    Status  New            Plan - 12/26/17 1013    Clinical Impression Statement  The patient is demonstrating some emerging awareness of the left side.  He can report that he runs into objects on the left and that stopping/scanning would help him.  He still needs cues to do this in the clinic.  PT to continue working to [employer].  Also plan to f/u with orthotist to determine if knee recurvatum could be improved (if patient willing to use a brace/AFO).    PT Treatment/Interventions  ADLs/Self Care Home Management;Gait training;Stair training;Functional mobility training;Therapeutic activities;Therapeutic exercise;Neuromuscular re-education;Balance training;Patient/family education;Orthotic Fit/Training;Manual techniques    PT Next Visit Plan  check HEP periodically , seated postural strengthening, standing balance in clinic, gait training emphasizing endurance and L visual scanning to avoid obstacles    Consulted and Agree with Plan of Care  Patient       Patient will benefit from skilled therapeutic intervention in order to improve the following deficits and impairments:  Abnormal gait, Decreased endurance, Decreased activity tolerance, Decreased strength, Decreased safety awareness, Impaired flexibility, Postural dysfunction, Decreased balance, Decreased mobility  Visit Diagnosis: Unsteadiness on feet  Other abnormalities of gait and mobility  Muscle weakness (generalized)  Abnormal posture     Problem List Patient Active Problem List   Diagnosis Date  Noted  . 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 Foul smelling urine   . Leukocytosis   . Hyponatremia   . Acute right PCA stroke ([place]) 11/06/2017  . Benign essential HTN   . Coronary artery disease involving native coronary artery of native heart without angina pectoris   . Stage 3 chronic kidney disease ([place])   . Acute lower UTI   . Seasonal allergies   . Stroke ([place]) 11/04/2017  . HLD (hyperlipidemia) 11/04/2017  . Chronic diastolic CHF (congestive heart failure) ([place]) 11/04/2017  . Acute renal failure superimposed on stage 2 chronic kidney disease ([place]) 11/04/2017  . Acute metabolic encephalopathy 37/16/9678  . UTI (urinary tract infection) 11/04/2017  . AKI (acute kidney injury) ([place])   . History of stroke   . Prediabetes   . Left hemiparesis ([place]) 03/19/2013  . Cerebral infarction ([place]) 03/19/2013  . Left leg weakness 03/19/2013  . CAD, ARTERY BYPASS GRAFT 09/30/2008  . 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 Borderline hyperglycemia 12/03/2007  . Other and unspecified hyperlipidemia 07/28/2007  . Essential hypertension 07/28/2007  . 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 Coronary atherosclerosis  07/28/2007  . BENIGN PROSTATIC HYPERTROPHY 07/28/2007  . COLONIC POLYPS, 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    Nithya Meriweather, PT 12/26/2017, 10:14 AM  [place] [address] [place], [place], [address] Phone: [phone]   Fax:  [phone]  Name: Thomas [name]. MRN: [id number] Date of Birth: June 17, 1941

## 2017-12-26 NOTE — Therapy (Signed)
[facility] [facility] [address] [place], [place], [address] Phone: [phone]   Fax:  [phone]  Occupational Therapy Treatment  Patient Details  Name: Thomas [name]. MRN: [id number] Date of Birth: 1941/05/28 Referring Provider: Faith Rogue, MD   Encounter Date: 12/26/2017  OT End of Session - 12/26/17 1227    Visit Number  8    Number of Visits  17    Date for OT Re-Evaluation  01/28/18    Authorization Type  [employer]HC MCR    OT Start Time  1018    OT Stop Time  1103    OT Time Calculation (min)  45 min    Activity Tolerance  Patient tolerated treatment well    Behavior During Therapy  [facility] for tasks assessed/performed       Past Medical History:  Diagnosis Date  . CHF (congestive heart failure) ([facility]) 1999  . CKD (chronic kidney disease), stage II   . Coronary artery disease   . Hx of colonic polyps   . Hyperlipidemia   . Hyperplastic colon polyp 04/15/2007  . Hypertension   . Prostatic hypertrophy, benign    with elevated PSA  . Stroke ([facility])   . TIA (transient ischemic attack) 2003   "mini stroke" (03/19/2013)    Past Surgical History:  Procedure Laterality Date  . CARDIAC CATHETERIZATION    . CATARACT EXTRACTION W/ INTRAOCULAR LENS  IMPLANT, BILATERAL Bilateral 1998  . COLONOSCOPY  2008  . CORONARY ARTERY BYPASS GRAFT  1999   x4  . LOOP RECORDER INSERTION N/A 11/05/2017   Procedure: LOOP RECORDER INSERTION;  Surgeon: Hillis Range, MD;  Location: [facility] INVASIVE CV LAB;  Service: Cardiovascular;  Laterality: N/A;  . TEE WITHOUT CARDIOVERSION N/A 11/05/2017   Procedure: TRANSESOPHAGEAL ECHOCARDIOGRAM (TEE) WITH LOOP;  Surgeon: Lars Masson, MD;  Location: [facility] ENDOSCOPY;  Service: Cardiovascular;  Laterality: N/A;    There were no vitals filed for this visit.  Subjective Assessment - 12/26/17 1020    Pertinent History  Rt CVA 11/03/17. PMH: HTN, CHF, CKD stage 2, previous CVA 2014 with residual Lt LE  weakness    Limitations  LOOP RECORDER, cognitive deficits    Patient Stated Goals  Get back to doing things I used to do    Currently in Pain?  No/denies                   OT Treatments/Exercises (OP) - 12/26/17 0001      ADLs   ADL Comments  Pt/wife shown walker tray for walker to increase safety/independence with transporting snacks/drink (pt still needs supervision for balance/safety). Pt's wife also reports that pt did not qualify for [facility] because they now only take people with dementia and Alzheimers dz. Therapist encouraged wife to discuss w/ neurologist at next MD visit b/c therapist suspects dementia is present based on pt's symptoms/presentation.       Visual/Perceptual Exercises   Scanning - Tabletop  Pt scanning for colors of 1" blocks on tabletop in particular order and gripping with gripper Lt hand for attention to Lt hand/Lt side of body and visual scanning to Lt side/near body scanning with mod v.c's to look all the way to edge of table. Pt would often start at midline and scan to Rt side, then come back to Lt side with prompts. Pt required extra time, rest breaks, and encouragement to complete task. Pt also needed 1 rest break for Lt hand d/t fatigue  and drops.     Other Exercises  Pt assembling simple 12 pc. puzzle (lion) with max v.c's required               OT Short Term Goals - 12/23/17 1020      OT SHORT TERM GOAL #1   Title  Pt/family will verbalize understanding with visual scanning strategies and memory compensatory strategies    Time  4    Period  Weeks    Status  Achieved      OT SHORT TERM GOAL #2   Title  Pt/family will verbalize understanding with coordination HEP for bilateral hands    Time  4    Period  Weeks    Status  Achieved      OT SHORT TERM GOAL #3   Title  Pt will perform LE dressing with min assist    Time  4    Period  Weeks    Status  Achieved pt doing only with supervision      OT SHORT TERM  GOAL #4   Title  Pt will perform toilet transfer with min assist and clothes management with no more than mod assist    Time  4    Period  Weeks    Status  Achieved supervision only      OT SHORT TERM GOAL #5   Title  Pt will attend to Lt side for near body and tabletop scanning with > 75% accuracy    Time  4    Period  Weeks    Status  On-going        OT Long Term Goals - 12/23/17 1024      OT LONG TERM GOAL #1   Title  Independent with updated HEP for UE's    Time  8    Period  Weeks    Status  New      OT LONG TERM GOAL #2   Title  Pt will perform LE dressing with set up/sup only    Time  8    Period  Weeks    Status  Achieved      OT LONG TERM GOAL #3   Title  Pt will perform toilet transfer and tub transfer with close sup only using DME prn    Time  8    Period  Weeks    Status  On-going      OT LONG TERM GOAL #4   Title  Pt will perform clothes management after toileting supervision level    Time  8    Period  Weeks    Status  Revised      OT LONG TERM GOAL #5   Title  Pt will perform simple snack prep at supervision level using rollator or tray on walker    Time  8    Period  Weeks    Status  Revised      OT LONG TERM GOAL #6   Title  Pt to perform medication management with direct sup/min cueing prn    Time  8    Period  Weeks    Status  Deferred due to cognitive deficits            Plan - 12/26/17 1227    Clinical Impression Statement  Pt progressing towards goals - pt slightly worse with tabletop scanning today, however may be partailly d/t fatigued coming from P.T. session.     Occupational Profile and client history currently  impacting functional performance  PMH: HTN, CHF, CKD (stage 2), previous Rt CVA 2014, HOH    Occupational performance deficits (Please refer to evaluation for details):  ADL's;IADL's;Leisure;Social Participation    Rehab Potential  Fair    Current Impairments/barriers affecting progress:  severity of deficits including  cognition and decreased attention and field cut to Lt side    OT Frequency  2x / week    OT Duration  8 weeks    OT Treatment/Interventions  Self-care/ADL training;DME and/or AE instruction;Moist Heat;Aquatic Therapy;Therapeutic activities;Therapeutic exercise;Cognitive remediation/compensation;Coping strategies training;Neuromuscular education;Functional Mobility Training;Passive range of motion;Visual/perceptual remediation/compensation;Manual Therapy;Patient/family education    Plan  continue progress towards LTG's, functional use of Lt hand, scanning Lt side    Consulted and Agree with Plan of Care  Patient;Family member/caregiver    Family Member Consulted  Wife       Patient will benefit from skilled therapeutic intervention in order to improve the following deficits and impairments:  Decreased coordination, Decreased range of motion, Difficulty walking, Improper body mechanics, Decreased endurance, Decreased safety awareness, Decreased activity tolerance, Decreased knowledge of precautions, Impaired tone, Impaired UE functional use, Decreased knowledge of use of DME, Decreased balance, Decreased cognition, Decreased mobility, Decreased strength, Impaired perceived functional ability, Impaired vision/preception  Visit Diagnosis: Visuospatial deficit  Hemiplegia and hemiparesis following cerebral infarction affecting left non-dominant side ([facility])  Other lack of coordination  Unsteadiness on feet    Problem List Patient Active Problem List   Diagnosis Date Noted  . 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 Foul smelling urine   . Leukocytosis   . Hyponatremia   . 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 Benign essential HTN   . Coronary artery disease involving native coronary artery of native heart without angina pectoris   . Stage 3 chronic kidney disease ([facility])   . Acute lower UTI   . Seasonal allergies   . Stroke ([facility]) 11/04/2017  . HLD (hyperlipidemia)  11/04/2017  . Chronic diastolic CHF (congestive heart failure) ([facility]) 11/04/2017  . Acute renal failure superimposed on stage 2 chronic kidney disease ([facility]) 11/04/2017  . Acute metabolic encephalopathy 11/04/2017  . UTI (urinary tract infection) 11/04/2017  . AKI (acute kidney injury) ([facility])   . History of stroke   . Prediabetes   . Left hemiparesis ([facility]) 03/19/2013  . Cerebral infarction ([facility]) 03/19/2013  . Left leg weakness 03/19/2013  . CAD, ARTERY BYPASS GRAFT 09/30/2008  . 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 Borderline hyperglycemia 12/03/2007  . Other and unspecified hyperlipidemia 07/28/2007  . Essential hypertension 07/28/2007  . 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 Coronary atherosclerosis 07/28/2007  . BENIGN PROSTATIC HYPERTROPHY 07/28/2007  . COLONIC POLYPS, 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    Kelli Churn, OTR/L 12/26/2017, 12:29 PM  [place] [address] [place], [place], [address] Phone: [phone]   Fax:  [phone]  Name: Thomas [name]. MRN: [id number] Date of Birth: [birth year]

## 2017-12-27 ENCOUNTER — Telehealth: Payer: Self-pay | Admitting: Neurology

## 2017-12-27 MED ORDER — QUETIAPINE FUMARATE 25 MG PO TABS: 25.0000 mg | ORAL_TABLET | Freq: Every day | ORAL | Status: DC

## 2017-12-27 NOTE — Addendum Note (Signed)
Addended by: Rocky Link A on: 12/27/2017 10:11 AM   Modules accepted: Orders

## 2017-12-27 NOTE — Telephone Encounter (Signed)
Yes, we cn go up to a whole tablet at bedtime. Will you put in the prescription please?

## 2017-12-27 NOTE — Telephone Encounter (Signed)
Patient was started on the low dose Seroquel 25mg  1/2 tablet at bedtime on 12/23/17. Would you recommend they increase to a full tablet or give it more time? Please advise.

## 2017-12-27 NOTE — Telephone Encounter (Signed)
I spoke with patient's wife and made her aware of the recommendations to increase to the full tablet and see how he does over the weekend. She voiced understanding and appreciation and will call us next week if this is still not helping. I have updated Rx order in chart.

## 2017-12-27 NOTE — Telephone Encounter (Signed)
Pt's wife called QUEtiapine (SEROQUEL) 25 MG tablet is not helping to sleep thru the night. He sleeps for a few hours and then is up every few hours during the night. She is wanting to know if the medication can be increased. Please call to advise. She is aware Dr Roda Shutters is not in the clinic but would like this addressed today please. Thank you

## 2017-12-30 ENCOUNTER — Encounter: Payer: Self-pay | Admitting: Physical Medicine & Rehabilitation

## 2017-12-30 ENCOUNTER — Ambulatory Visit: Payer: Medicare Other | Admitting: Rehabilitation

## 2017-12-30 ENCOUNTER — Encounter: Payer: Self-pay | Admitting: Rehabilitation

## 2017-12-30 ENCOUNTER — Ambulatory Visit: Payer: Medicare Other | Admitting: Occupational Therapy

## 2017-12-30 ENCOUNTER — Encounter: Payer: Medicare Other | Attending: Registered Nurse | Admitting: Physical Medicine & Rehabilitation

## 2017-12-30 VITALS — BP 113/66 | HR 72 | Ht 71.0 in | Wt 166.0 lb

## 2017-12-30 DIAGNOSIS — I69318 Other symptoms and signs involving cognitive functions following cerebral infarction: Secondary | ICD-10-CM | POA: Diagnosis not present

## 2017-12-30 DIAGNOSIS — Z87891 Personal history of nicotine dependence: Secondary | ICD-10-CM | POA: Insufficient documentation

## 2017-12-30 DIAGNOSIS — I130 Hypertensive heart and chronic kidney disease with heart failure and stage 1 through stage 4 chronic kidney disease, or unspecified chronic kidney disease: Secondary | ICD-10-CM | POA: Diagnosis not present

## 2017-12-30 DIAGNOSIS — R2681 Unsteadiness on feet: Secondary | ICD-10-CM

## 2017-12-30 DIAGNOSIS — I63531 Cerebral infarction due to unspecified occlusion or stenosis of right posterior cerebral artery: Secondary | ICD-10-CM | POA: Insufficient documentation

## 2017-12-30 DIAGNOSIS — I69354 Hemiplegia and hemiparesis following cerebral infarction affecting left non-dominant side: Secondary | ICD-10-CM

## 2017-12-30 DIAGNOSIS — I509 Heart failure, unspecified: Secondary | ICD-10-CM | POA: Insufficient documentation

## 2017-12-30 DIAGNOSIS — F0151 Vascular dementia with behavioral disturbance: Secondary | ICD-10-CM | POA: Diagnosis not present

## 2017-12-30 DIAGNOSIS — E785 Hyperlipidemia, unspecified: Secondary | ICD-10-CM | POA: Insufficient documentation

## 2017-12-30 DIAGNOSIS — Z951 Presence of aortocoronary bypass graft: Secondary | ICD-10-CM | POA: Insufficient documentation

## 2017-12-30 DIAGNOSIS — R41842 Visuospatial deficit: Secondary | ICD-10-CM

## 2017-12-30 DIAGNOSIS — M6281 Muscle weakness (generalized): Secondary | ICD-10-CM

## 2017-12-30 DIAGNOSIS — F01518 Vascular dementia, unspecified severity, with other behavioral disturbance: Secondary | ICD-10-CM

## 2017-12-30 DIAGNOSIS — N400 Enlarged prostate without lower urinary tract symptoms: Secondary | ICD-10-CM | POA: Insufficient documentation

## 2017-12-30 DIAGNOSIS — R339 Retention of urine, unspecified: Secondary | ICD-10-CM | POA: Diagnosis not present

## 2017-12-30 DIAGNOSIS — I2510 Atherosclerotic heart disease of native coronary artery without angina pectoris: Secondary | ICD-10-CM | POA: Diagnosis not present

## 2017-12-30 DIAGNOSIS — R293 Abnormal posture: Secondary | ICD-10-CM

## 2017-12-30 DIAGNOSIS — N182 Chronic kidney disease, stage 2 (mild): Secondary | ICD-10-CM | POA: Insufficient documentation

## 2017-12-30 DIAGNOSIS — R2689 Other abnormalities of gait and mobility: Secondary | ICD-10-CM

## 2017-12-30 MED ORDER — BETHANECHOL CHLORIDE 10 MG PO TABS: 10.0000 mg | ORAL_TABLET | Freq: Three times a day (TID) | ORAL | 0 refills | Status: DC

## 2017-12-30 MED ORDER — DIVALPROEX SODIUM 250 MG PO DR TAB: 250.0000 mg | DELAYED_RELEASE_TABLET | Freq: Two times a day (BID) | ORAL | 4 refills | Status: DC

## 2017-12-30 NOTE — Therapy (Signed)
[facility] [address] [place], [place], [address] Phone: [phone]   Fax:  [phone]  Physical Therapy Treatment  Patient Details  Name: Thomas [name]. MRN: [id number] Date of Birth: 1941-06-18 Referring Provider: Alger Simons, MD   Encounter Date: 12/30/2017  PT End of Session - 12/30/17 2001    Visit Number  8    Number of Visits  17 eval + 2x/week for 8 weeks    Date for PT Re-Evaluation  01/27/18    Authorization Type  [employer] medicare     PT Start Time  1020    PT Stop Time  1102    PT Time Calculation (min)  42 min    Equipment Utilized During Treatment  Gait belt    Activity Tolerance  Patient tolerated treatment well    Behavior During Therapy  WFL for tasks assessed/performed       Past Medical History:  Diagnosis Date  . CHF (congestive heart failure) ([place]) 1999  . CKD (chronic kidney disease), stage II   . Coronary artery disease   . Hx of colonic polyps   . Hyperlipidemia   . Hyperplastic colon polyp 04/15/2007  . Hypertension   . Prostatic hypertrophy, benign    with elevated PSA  . Stroke ([place])   . TIA (transient ischemic attack) 2003   "mini stroke" (03/19/2013)    Past Surgical History:  Procedure Laterality Date  . CARDIAC CATHETERIZATION    . CATARACT EXTRACTION W/ INTRAOCULAR LENS  IMPLANT, BILATERAL Bilateral 1998  . COLONOSCOPY  2008  . CORONARY ARTERY BYPASS GRAFT  1999   x4  . LOOP RECORDER INSERTION N/A 11/05/2017   Procedure: LOOP RECORDER INSERTION;  Surgeon: Thompson Grayer, MD;  Location: [facility];  Service: Cardiovascular;  Laterality: N/A;  . TEE WITHOUT CARDIOVERSION N/A 11/05/2017   Procedure: TRANSESOPHAGEAL ECHOCARDIOGRAM (TEE) WITH LOOP;  Surgeon: Dorothy Spark, MD;  Location: [facility] ENDOSCOPY;  Service: Cardiovascular;  Laterality: N/A;    There were no vitals filed for this visit.  Subjective Assessment - 12/30/17 1027    Subjective  No changes,  wife reports walking was not great yesterday at home.  He does not let her touch him while he is walking. Wife notes that they have increased sleeping pill (they were taking half, now taking whole) and this seems to have impacted his balance and mobility.      Patient is accompained by:  Family member    Pertinent History  CKD, prior CVA using RW prior to recent admission, HTN, CHF.    Patient Stated Goals  Moving "a whole lot easier than I am now."  Wife notes she wants him to get stronger and get back to where he can have a little more independence.    Currently in Pain?  No/denies                       [facility] Adult PT Treatment/Exercise - 12/30/17 1049      Transfers   Transfers  Sit to Stand;Stand to Sit    Sit to Stand  5: Supervision    Sit to Stand Details  Verbal cues for sequencing;Verbal cues for technique;Verbal cues for precautions/safety    Stand to Sit  5: Supervision    Stand to Sit Details (indicate cue type and reason)  Verbal cues for sequencing;Verbal cues for technique;Verbal cues for precautions/safety    Comments  Performed x 10 reps  during session with max cues for hand placement and forward weight shift.  Note increased difficulty with this today vs previous sessions.  Pts wife state that they have started him on whole pill of a sleeping medication (was only taking 1/2, PT questions whether this may be related to decline in mobility today).        Ambulation/Gait   Ambulation/Gait  Yes    Ambulation/Gait Assistance  4: Min guard    Ambulation/Gait Assistance Details  Pt continues to require min/guard A for all ambulation with RW.  Max verbal cues to attend to L environment and to steer to the L as he tends to avoid L environment completely and tends to run into items on R today.      Ambulation Distance (Feet)  200 Feet    Assistive device  Rolling walker    Gait Pattern  Step-through pattern;Decreased step length - right;Decreased step length - left;Left  genu recurvatum;Decreased trunk rotation;Decreased weight shift to left    Ambulation Surface  Level;Indoor    Ramp  4: Min assist    Ramp Details (indicate cue type and reason)  Cues for upright posture when moving down ramp, cues for forward posture when descending ramp.      Curb  4: Min assist    Curb Details (indicate cue type and reason)  cues for sequencing with RW.  Pt ascending curb with LLE which seemed to increase safety due to L inattention.  Discussed with pt and wife that PT would recommend he continue to ascend with LLE to avoid L foot catching.  Both verbalized understanding.        Neuro Re-ed    Neuro Re-ed Details   Standing balance working on stepping in // bars over two black balance beams to improve hip/knee strength as well as awareness of LLE to carryover to improved gait quality.  Performed x 10 reps stepping forwards/backwards and x 10 reps laterally.  Pt requires max cues for posture and maintaining feet under body as he tends to step posteriorly (when side stepping) increasing forward flexion.  Briefly reviewed HEP performing sit to stand as mentioned above, supine BLE bridging with hip add x 10 reps, lowering/elevating LLE to/from mat again to improve hip/knee motion x 10 reps-added to HEP.                 PT Short Term Goals - 12/23/17 1019      PT SHORT TERM GOAL #1   Title  The patient will be indep with HEP for LE strengthening, posture, stretching and general mobility.    Baseline  met 12/23/17    Time  4    Period  Weeks    Status  Achieved      PT SHORT TERM GOAL #2   Title  The patient will improve Berg balance score from 7/56 to > or equal to 16/56 to demonstrate improving independence for standing balance and transfers.    Baseline  Improved to 19/56.    Time  4    Period  Weeks    Status  Achieved      PT SHORT TERM GOAL #3   Title  The patient will move sit<>stand with UE support 5/5 trials mod indep to RW.    Baseline  moderate to max  progressed to mod I to RW on 12/23/17    Time  4    Period  Weeks    Status  Achieved  PT SHORT TERM GOAL #4   Title  The patient will be further assessed on gait speed and LTG to follow.    Baseline  1.23 ft/sec with RW at [facility]     Time  4    Period  Weeks    Status  Achieved      PT SHORT TERM GOAL #5   Title  The patient will ambulate x 200 ft nonstop with RW and CGA to demonstrate improving endurance and dec'd caregiver assist.    Baseline  pt able to ambulate 300' nonstop at [facility] on 12/23/17    Time  4    Period  Weeks    Status  Achieved        PT Long Term Goals - 12/26/17 1013      PT LONG TERM GOAL #1   Title  The patient will improve functional status score from 21% to > or equal to 35% to demo improved perception of functional abiltiies.    Time  8    Period  Weeks    Target Date  01/27/18      PT LONG TERM GOAL #2   Title  The patient will improve gait speed *baseline to be established after eval.    Time  8    Period  Weeks      PT LONG TERM GOAL #3   Title  The patient will improve Berg score from 7/56 to > or equal to 22/56 to demo improving standing balance for ADLs/functional tasks.    Time  8    Period  Weeks      PT LONG TERM GOAL #4   Title  The patient will negotiate 4 steps with one HR with supervision for safe entrance/exit from home.    Time  8    Period  Weeks      PT LONG TERM GOAL #5   Title  The patient will ambulate x 350 ft with RW mod indep for improved household and short distance community negotiation to reduce caregiver assist.    Time  8    Period  Weeks      PT LONG TERM GOAL #6   Title  The patient will move floor<>stand with UE support and min A due to h/o falls.    Time  8    Period  Weeks      PT LONG TERM GOAL #7   Title  Pt will improve gait speed to 1.83 ft/sec w/ LRAD at S level in order to indicate decreased fall risk.     Time  8    Period  Weeks    Status  New            Plan - 12/30/17 2002     Clinical Impression Statement  Pt demonstrated somewhat of decline during session.  Wife reports that over the weekend, they increased amount of sleeping medication he is taking.  PT questions whether this is causing decline in mobility.  Continue to focus on balance and NMR exercises for increased hip/knee flex and improved stepping strategy/sequence to carryover to improved gait.      PT Treatment/Interventions  ADLs/Self Care Home Management;Gait training;Stair training;Functional mobility training;Therapeutic activities;Therapeutic exercise;Neuromuscular re-education;Balance training;Patient/family education;Orthotic Fit/Training;Manual techniques    PT Next Visit Plan  Christina-I texted Gerald Stabs, will let you know what I hear from him, check HEP periodically , seated postural strengthening, standing balance in clinic, gait training emphasizing endurance and L visual scanning to  avoid obstacles    Consulted and Agree with Plan of Care  Patient       Patient will benefit from skilled therapeutic intervention in order to improve the following deficits and impairments:  Abnormal gait, Decreased endurance, Decreased activity tolerance, Decreased strength, Decreased safety awareness, Impaired flexibility, Postural dysfunction, Decreased balance, Decreased mobility  Visit Diagnosis: Hemiplegia and hemiparesis following cerebral infarction affecting left non-dominant side (HCC)  Unsteadiness on feet  Other abnormalities of gait and mobility  Muscle weakness (generalized)  Abnormal posture     Problem List Patient Active Problem List   Diagnosis Date Noted  . 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 Foul smelling urine   . Leukocytosis   . Hyponatremia   . Acute right PCA stroke ([place]) 11/06/2017  . Benign essential HTN   . Coronary artery disease involving native coronary artery of native heart without angina pectoris   . Stage 3 chronic kidney disease ([place])   . Acute  lower UTI   . Seasonal allergies   . Stroke ([place]) 11/04/2017  . HLD (hyperlipidemia) 11/04/2017  . Chronic diastolic CHF (congestive heart failure) ([place]) 11/04/2017  . Acute renal failure superimposed on stage 2 chronic kidney disease ([place]) 11/04/2017  . Acute metabolic encephalopathy 74/45/1460  . UTI (urinary tract infection) 11/04/2017  . AKI (acute kidney injury) ([place])   . History of stroke   . Prediabetes   . Left hemiparesis ([place]) 03/19/2013  . Cerebral infarction ([place]) 03/19/2013  . Left leg weakness 03/19/2013  . CAD, ARTERY BYPASS GRAFT 09/30/2008  . 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 Borderline hyperglycemia 12/03/2007  . Other and unspecified hyperlipidemia 07/28/2007  . Essential hypertension 07/28/2007  . 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 Coronary atherosclerosis 07/28/2007  . BENIGN PROSTATIC HYPERTROPHY 07/28/2007  . COLONIC POLYPS, 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    Cameron Sprang, PT, MPT [facility] [address] [place], [place], [address] Phone: [phone]   Fax:  [phone] 12/30/17, 8:08 PM  Name: Thomas [name]. MRN: [id number] Date of Birth: 12-Aug-1941

## 2017-12-30 NOTE — Progress Notes (Signed)
Subjective:    Patient ID: Thomas [name]., male    DOB: [birth year], 77 y.o.   MRN: [id number]  HPI   Mr. Spizzirri is here in follow up of his right PCA stroke. He has had some problems sleeping and was started on seroquel by his primary which has worked at the 25mg  dose. Unfortunately, he's had ongoing behavioral issues with family, particularly his wife. He tends to verbally lash out   at others when he's frustrated or when others want to help. He rarely is physical with his aggression. The behavior at times has put him at risk for falls and other perils as well.  He continues to work at [employer] and make gains. Wife does not notice the same behavior as much around staff there. He was quite irritable while with [place] on inpatient rehab however.   He is continent of bowel and bladder. He is off flomax without a change. He still takes the urecholine. He is eating well. His short term memory is poor.     Pain Inventory Average Pain 3 Pain Right Now 0 My pain is dull  In the last 24 hours, has pain interfered with the following? General activity 3 Relation with others 3 Enjoyment of life 3 What TIME of day is your pain at its worst? daytime Sleep (in general) Poor  Pain is worse with: walking Pain improves with: medication Relief from Meds: 5  Mobility walk with assistance use a walker do you drive?  no use a wheelchair  Function retired  Neuro/Psych trouble walking confusion  Prior Studies Any changes since last visit?  no  Physicians involved in your care Any changes since last visit?  no   Family History  Problem Relation Age of Onset  . Coronary artery disease Mother   . Stroke Father    Social History   Socioeconomic History  . Marital status: Married    Spouse name: cathy  . Number of children: 2  . Years of education: college  . Highest education level: Not on file  Occupational History  . Occupation: retired  Engineer, production  . Financial  resource strain: Not on file  . Food insecurity:    Worry: Not on file    Inability: Not on file  . Transportation needs:    Medical: Not on file    Non-medical: Not on file  Tobacco Use  . Smoking status: Former Smoker    Packs/day: 0.75    Years: 50.00    Pack years: 37.50    Types: Cigarettes    Last attempt to quit: 03/19/2013    Years since quitting: 4.7  . Smokeless tobacco: Never Used  Substance and Sexual Activity  . Alcohol use: Yes    Alcohol/week: 0.6 oz    Types: 1 Cans of beer per week    Comment: 03/19/2013 "1 beer/week" does not drink anymore  . Drug use: No  . Sexual activity: Not Currently  Lifestyle  . Physical activity:    Days per week: Not on file    Minutes per session: Not on file  . Stress: Not on file  Relationships  . Social connections:    Talks on phone: Not on file    Gets together: Not on file    Attends religious service: Not on file    Active member of club or organization: Not on file    Attends meetings of clubs or organizations: Not on file    Relationship status: Not  on file  Other Topics Concern  . Not on file  Social History Narrative   Patient lives at home with wife Thomas [name])   Retired.   Education one year of college.   Right handed.   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    Past Surgical History:  Procedure Laterality Date  . CARDIAC CATHETERIZATION    . CATARACT EXTRACTION W/ INTRAOCULAR LENS  IMPLANT, BILATERAL Bilateral 1998  . COLONOSCOPY  2008  . CORONARY ARTERY BYPASS GRAFT  1999   x4  . LOOP RECORDER INSERTION N/A 11/05/2017   Procedure: LOOP RECORDER INSERTION;  Surgeon: Hillis Range, MD;  Location: [facility];  Service: Cardiovascular;  Laterality: N/A;  . TEE WITHOUT CARDIOVERSION N/A 11/05/2017   Procedure: TRANSESOPHAGEAL ECHOCARDIOGRAM (TEE) WITH LOOP;  Surgeon: Lars Masson, MD;  Location: [facility] ENDOSCOPY;  Service: Cardiovascular;  Laterality: N/A;   Past Medical History:  Diagnosis Date  . CHF  (congestive heart failure) (HCC) 1999  . CKD (chronic kidney disease), stage II   . Coronary artery disease   . Hx of colonic polyps   . Hyperlipidemia   . Hyperplastic colon polyp 04/15/2007  . Hypertension   . Prostatic hypertrophy, benign    with elevated PSA  . Stroke (HCC)   . TIA (transient ischemic attack) 2003   "mini stroke" (03/19/2013)   There were no vitals taken for this visit.  Opioid Risk Score:   Fall Risk Score:  `1  Depression screen PHQ 2/9  Depression screen [facility] 2/9 12/04/2017 11/15/2016 11/15/2016 07/13/2014 06/25/2013  Decreased Interest 1 0 0 0 0  Down, Depressed, Hopeless 0 0 0 0 0  PHQ - 2 Score 1 0 0 0 0  Altered sleeping 0 - - - -  Tired, decreased energy 1 - - - -  Change in appetite 1 - - - -  Feeling bad or failure about yourself  1 - - - -  Trouble concentrating 2 - - - -  Moving slowly or fidgety/restless 1 - - - -  Suicidal thoughts 1 - - - -  PHQ-9 Score 8 - - - -     Review of Systems  Constitutional: Negative.   HENT: Negative.   Eyes: Negative.   Respiratory: Negative.   Cardiovascular: Negative.   Gastrointestinal: Negative.   Endocrine: Negative.   Genitourinary: Negative.   Musculoskeletal: Positive for gait problem.  Skin: Negative.   Allergic/Immunologic: Negative.   Hematological: Negative.   Psychiatric/Behavioral: Positive for confusion.       Objective:   Physical Exam  General: No acute distress HEENT: EOMI, oral membranes moist Cards: reg rate  Chest: normal effort Abdomen: Soft, NT, ND Skin: dry, intact Extremities: no edema  No limb edema.   Neurological: oriented to person, place, reason, year and month. Remembered his address.   ongoing left inattention and homonymous hemianopsia-persistent--stable neurological  .   LUE:5/5 proximal to distal, RUE almost 5/5 RLE:   5-/5 proximal to distal LLE: 5/5 proximal to distal  Coordination improving.  Senses pain and light touch in all 4 Skin: Warm Psychiatric:  pleasant        Assessment & Plan:  1. 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              -continue with outpt therapies.  2.  CAD s/p CABG: On lipitor and ASA, no chest pain 3.  CKD stage III: SCr-  1.8. Cr 1.13 3/28 4. UTI/BPH: 3/25 UCx showed diptheroids (?contaminant)               -wean off urecholine  -has already stopped flomax 5. Neuro-psych/dementia:  -trial of low dose depakote to see if we can decrease his labile irritability which has led to problems at home  -continue seroquel for sleep   Follow up with me in about a month. Fifteen minutes of face to face patient care time were spent during this visit. All questions were encouraged and answered.  Marland KitchenMarland Kitchen

## 2017-12-30 NOTE — Therapy (Signed)
[facility] [address] [place], [place], [address] Phone: [phone]   Fax:  [phone]  Occupational Therapy Treatment  Patient Details  Name: Thomas [name]. MRN: [id number] Date of Birth: [birth year] Referring Provider: Faith Rogue, MD   Encounter Date: 12/30/2017  OT End of Session - 12/30/17 1208    Visit Number  9    Number of Visits  17    Date for OT Re-Evaluation  01/28/18    Authorization Type  [employer] MCR    OT Start Time  1105    OT Stop Time  1145    OT Time Calculation (min)  40 min    Activity Tolerance  Patient tolerated treatment well    Behavior During Therapy  [facility] for tasks assessed/performed       Past Medical History:  Diagnosis Date  . CHF (congestive heart failure) (HCC) 1999  . CKD (chronic kidney disease), stage II   . Coronary artery disease   . Hx of colonic polyps   . Hyperlipidemia   . Hyperplastic colon polyp 04/15/2007  . Hypertension   . Prostatic hypertrophy, benign    with elevated PSA  . Stroke (HCC)   . TIA (transient ischemic attack) 2003   "mini stroke" (03/19/2013)    Past Surgical History:  Procedure Laterality Date  . CARDIAC CATHETERIZATION    . CATARACT EXTRACTION W/ INTRAOCULAR LENS  IMPLANT, BILATERAL Bilateral 1998  . COLONOSCOPY  2008  . CORONARY ARTERY BYPASS GRAFT  1999   x4  . LOOP RECORDER INSERTION N/A 11/05/2017   Procedure: LOOP RECORDER INSERTION;  Surgeon: Hillis Range, MD;  Location: [facility];  Service: Cardiovascular;  Laterality: N/A;  . TEE WITHOUT CARDIOVERSION N/A 11/05/2017   Procedure: TRANSESOPHAGEAL ECHOCARDIOGRAM (TEE) WITH LOOP;  Surgeon: Lars Masson, MD;  Location: [facility] ENDOSCOPY;  Service: Cardiovascular;  Laterality: N/A;    There were no vitals filed for this visit.  Subjective Assessment - 12/30/17 1116    Subjective   Per wife: He's not having a good day    Pertinent History  Rt CVA 11/03/17. 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  No/denies                   OT Treatments/Exercises (OP) - 12/30/17 1122      ADLs   ADL Comments  Suggestions given to pt/wife for simple scanning and functional activities to scan and use Lt hand at home      Visual/Perceptual Exercises   Scanning - Environmental  Environmental scanning with obstacle negotiation - pt required mod to max cueing to remove obstacle to Lt side as pt wanted to keep moving rolling obstacle directly in front of him.     Scanning - Tabletop  tabletop scanning and visual/perceptual skills for simple 12 pc. puzzle with mod cueing for strategies, problem solving, orienting puzzle pieces, and looking Lt               OT Short Term Goals - 12/23/17 1020      OT SHORT TERM GOAL #1   Title  Pt/family will verbalize understanding with visual scanning strategies and memory compensatory strategies    Time  4    Period  Weeks    Status  Achieved      OT SHORT TERM GOAL #2   Title  Pt/family will verbalize understanding with coordination HEP for bilateral hands    Time  4    Period  Weeks    Status  Achieved      OT SHORT TERM GOAL #3   Title  Pt will perform LE dressing with min assist    Time  4    Period  Weeks    Status  Achieved pt doing only with supervision      OT SHORT TERM GOAL #4   Title  Pt will perform toilet transfer with min assist and clothes management with no more than mod assist    Time  4    Period  Weeks    Status  Achieved supervision only      OT SHORT TERM GOAL #5   Title  Pt will attend to Lt side for near body and tabletop scanning with > 75% accuracy    Time  4    Period  Weeks    Status  On-going        OT Long Term Goals - 12/23/17 1024      OT LONG TERM GOAL #1   Title  Independent with updated HEP for  UE's    Time  8    Period  Weeks    Status  New      OT LONG TERM GOAL #2   Title  Pt will perform LE dressing with set up/sup only    Time  8    Period  Weeks    Status  Achieved      OT LONG TERM GOAL #3   Title  Pt will perform toilet transfer and tub transfer with close sup only using DME prn    Time  8    Period  Weeks    Status  On-going      OT LONG TERM GOAL #4   Title  Pt will perform clothes management after toileting supervision level    Time  8    Period  Weeks    Status  Revised      OT LONG TERM GOAL #5   Title  Pt will perform simple snack prep at supervision level using rollator or tray on walker    Time  8    Period  Weeks    Status  Revised      OT LONG TERM GOAL #6   Title  Pt to perform medication management with direct sup/min cueing prn    Time  8    Period  Weeks    Status  Deferred due to cognitive deficits            Plan - 12/30/17 1210    Clinical Impression Statement  Pt limited in environmental scanning and negotiation of obstacles d/t Lt neglect    Occupational Profile and client history currently impacting functional performance  PMH: HTN, CHF, CKD (stage 2), previous Rt CVA 2014, HOH    Occupational performance deficits (Please refer to evaluation for details):  ADL's;IADL's;Leisure;Social Participation    Rehab Potential  Fair    Current Impairments/barriers affecting progress:  severity of deficits including cognition and decreased attention and field cut to Lt side    OT Frequency  2x / week    OT Duration  8 weeks    OT Treatment/Interventions  Self-care/ADL training;DME and/or AE instruction;Moist Heat;Aquatic Therapy;Therapeutic  activities;Therapeutic exercise;Cognitive remediation/compensation;Coping strategies training;Neuromuscular education;Functional Mobility Training;Passive range of motion;Visual/perceptual remediation/compensation;Manual Therapy;Patient/family education    Plan  progress towards LTG's, functional use of  Lt hand, try retrieving simple snack or something from refrigerator    Consulted and Agree with Plan of Care  Patient;Family member/caregiver    Family Member Consulted  Wife       Patient will benefit from skilled therapeutic intervention in order to improve the following deficits and impairments:  Decreased coordination, Decreased range of motion, Difficulty walking, Improper body mechanics, Decreased endurance, Decreased safety awareness, Decreased activity tolerance, Decreased knowledge of precautions, Impaired tone, Impaired UE functional use, Decreased knowledge of use of DME, Decreased balance, Decreased cognition, Decreased mobility, Decreased strength, Impaired perceived functional ability, Impaired vision/preception  Visit Diagnosis: Hemiplegia and hemiparesis following cerebral infarction affecting left non-dominant side (HCC)  Visuospatial deficit  Unsteadiness on feet    Problem List Patient Active Problem List   Diagnosis Date Noted  . 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 Foul smelling urine   . Leukocytosis   . Hyponatremia   . 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 Benign essential HTN   . Coronary artery disease involving native coronary artery of native heart without angina pectoris   . Stage 3 chronic kidney disease (HCC)   . Acute lower UTI   . Seasonal allergies   . Stroke (HCC) 11/04/2017  . HLD (hyperlipidemia) 11/04/2017  . Chronic diastolic CHF (congestive heart failure) (HCC) 11/04/2017  . Acute renal failure superimposed on stage 2 chronic kidney disease (HCC) 11/04/2017  . Acute metabolic encephalopathy 11/04/2017  . UTI (urinary tract infection) 11/04/2017  . AKI (acute kidney injury) (HCC)   . History of stroke   . Prediabetes   . Left hemiparesis (HCC) 03/19/2013  . Cerebral infarction (HCC) 03/19/2013  . Left leg weakness 03/19/2013  . CAD, ARTERY BYPASS GRAFT 09/30/2008  . 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 Borderline  hyperglycemia 12/03/2007  . Other and unspecified hyperlipidemia 07/28/2007  . Essential hypertension 07/28/2007  . 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 Coronary atherosclerosis 07/28/2007  . BENIGN PROSTATIC HYPERTROPHY 07/28/2007  . COLONIC POLYPS, 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    Kelli Churn, OTR/L 12/30/2017, 12:12 PM  [facility] [address] [place], [place], [address] Phone: [phone]   Fax:  [phone]  Name: Marcellis Frampton. MRN: [id number] Date of Birth: 31-Mar-1941

## 2017-12-30 NOTE — Addendum Note (Signed)
Addended by: Faith Rogue T on: 12/30/2017 02:06 PM   Modules accepted: Orders

## 2017-12-30 NOTE — Patient Instructions (Signed)
CONTINUE WITH SAME DOSE OF SEROQUEL   BEGIN DEPAKOTE 250MG  AT NIGHT FOR ONE WEEK THEN INCREASE TO TWICE DAILY THEREAFTER. IF YOU HAVE ANY PROBLEMS PLEASE CALL ME.

## 2017-12-31 LAB — CUP PACEART REMOTE DEVICE CHECK
Date Time Interrogation Session: 20190425140922
MDC IDC PG IMPLANT DT: 20190326

## 2018-01-02 ENCOUNTER — Ambulatory Visit: Payer: Medicare Other | Admitting: Occupational Therapy

## 2018-01-02 ENCOUNTER — Encounter: Payer: Self-pay | Admitting: Rehabilitative and Restorative Service Providers"

## 2018-01-02 ENCOUNTER — Ambulatory Visit: Payer: Medicare Other | Admitting: Rehabilitative and Restorative Service Providers"

## 2018-01-02 DIAGNOSIS — R41842 Visuospatial deficit: Secondary | ICD-10-CM

## 2018-01-02 DIAGNOSIS — R2681 Unsteadiness on feet: Secondary | ICD-10-CM

## 2018-01-02 DIAGNOSIS — R293 Abnormal posture: Secondary | ICD-10-CM

## 2018-01-02 DIAGNOSIS — M6281 Muscle weakness (generalized): Secondary | ICD-10-CM

## 2018-01-02 DIAGNOSIS — R2689 Other abnormalities of gait and mobility: Secondary | ICD-10-CM

## 2018-01-02 DIAGNOSIS — I69354 Hemiplegia and hemiparesis following cerebral infarction affecting left non-dominant side: Secondary | ICD-10-CM

## 2018-01-02 DIAGNOSIS — I69318 Other symptoms and signs involving cognitive functions following cerebral infarction: Secondary | ICD-10-CM | POA: Diagnosis not present

## 2018-01-02 NOTE — Therapy (Signed)
[facility] [address] [place], [place], [address] Phone: [phone]   Fax:  [phone]  Occupational Therapy Treatment  Patient Details  Name: Thomas [name]. MRN: [id number] Date of Birth: April 17, 1941 Referring Provider: Faith Rogue, MD   Encounter Date: 01/02/2018  OT End of Session - 01/02/18 1515    Visit Number  10    Number of Visits  17    Date for OT Re-Evaluation  01/28/18    Authorization Type  [employer] MCR    OT Start Time  1410    OT Stop Time  1500    OT Time Calculation (min)  50 min    Activity Tolerance  Patient tolerated treatment well    Behavior During Therapy  [facility] for tasks assessed/performed       Past Medical History:  Diagnosis Date  . CHF (congestive heart failure) (HCC) 1999  . CKD (chronic kidney disease), stage II   . Coronary artery disease   . Hx of colonic polyps   . Hyperlipidemia   . Hyperplastic colon polyp 04/15/2007  . Hypertension   . Prostatic hypertrophy, benign    with elevated PSA  . Stroke (HCC)   . TIA (transient ischemic attack) 2003   "mini stroke" (03/19/2013)    Past Surgical History:  Procedure Laterality Date  . CARDIAC CATHETERIZATION    . CATARACT EXTRACTION W/ INTRAOCULAR LENS  IMPLANT, BILATERAL Bilateral 1998  . COLONOSCOPY  2008  . CORONARY ARTERY BYPASS GRAFT  1999   x4  . LOOP RECORDER INSERTION N/A 11/05/2017   Procedure: LOOP RECORDER INSERTION;  Surgeon: Hillis Range, MD;  Location: [facility] INVAS[facility] CV LAB;  Service: Cardiovascular;  Laterality: N/A;  . TEE WITHOUT CARDIOVERSION N/A 11/05/2017   Procedure: TRANSESOPHAGEAL ECHOCARDIOGRAM (TEE) WITH LOOP;  Surgeon: Lars Masson, MD;  Location: [facility] ENDOSCOPY;  Service: Cardiovascular;  Laterality: N/A;    There were no vitals filed for this visit.  Subjective Assessment - 01/02/18 1509    Pertinent History  Rt CVA 11/03/17. PMH: HTN, CHF, CKD stage 2, previous CVA 2014 with residual Lt LE  weakness    Limitations  LOOP RECORDER, cognitive deficits    Patient Stated Goals  Get back to doing things I used to do    Currently in Pain?  Yes Lt LE - outside O.T. scope of practice                    OT Treatments/Exercises (OP) - 01/02/18 0001      ADLs   ADL Comments  Discussion with pt/wife re: D/C from O.T. next week d/t reaching maximal rehab O.T. potential at this time and reaching plateau with O.T. Wife in agreement and feels that pt unable to improve further with O.T. d/t cognitive deficits.       Visual/Perceptual Exercises   Scanning - Environmental  Environmental scanning w/ obstacle negotiation - pt removed obstacles better today, however still required cues to look Lt to find his chair, and to avoid taking long way around table to sit in chair.     Scanning - Tabletop  Tabletop scanning while also working on functional use and grip strength Lt hand using gripper at level 1 resistance to pick up blocks by color in certain order (scanning tabletop for particular colors) with cues to look far Lt almost each time. Pt required 1 rest break for Lt hand d/t increased drops/fatigue. Pt then played solitare with mod to max  cueing to scan Lt and strategize, however pt remembered basic fundamental rules of the game.                OT Short Term Goals - 12/23/17 1020      OT SHORT TERM GOAL #1   Title  Pt/family will verbalize understanding with visual scanning strategies and memory compensatory strategies    Time  4    Period  Weeks    Status  Achieved      OT SHORT TERM GOAL #2   Title  Pt/family will verbalize understanding with coordination HEP for bilateral hands    Time  4    Period  Weeks    Status  Achieved      OT SHORT TERM GOAL #3   Title  Pt will perform LE dressing with min assist    Time  4    Period  Weeks    Status  Achieved pt doing only with supervision      OT SHORT TERM GOAL #4   Title  Pt will perform toilet transfer with min  assist and clothes management with no more than mod assist    Time  4    Period  Weeks    Status  Achieved supervision only      OT SHORT TERM GOAL #5   Title  Pt will attend to Lt side for near body and tabletop scanning with > 75% accuracy    Time  4    Period  Weeks    Status  On-going        OT Long Term Goals - 01/02/18 1516      OT LONG TERM GOAL #1   Title  Independent with updated HEP for UE's    Time  8    Period  Weeks    Status  New      OT LONG TERM GOAL #2   Title  Pt will perform LE dressing with set up/sup only    Time  8    Period  Weeks    Status  Achieved      OT LONG TERM GOAL #3   Title  Pt will perform toilet transfer and tub transfer with close sup only using DME prn    Time  8    Period  Weeks    Status  Achieved      OT LONG TERM GOAL #4   Title  Pt will perform clothes management after toileting supervision level    Time  8    Period  Weeks    Status  Achieved      OT LONG TERM GOAL #5   Title  Pt will perform simple snack prep at supervision level using rollator or tray on walker    Time  8    Period  Weeks    Status  Revised      OT LONG TERM GOAL #6   Title  Pt to perform medication management with direct sup/min cueing prn    Time  8    Period  Weeks    Status  Deferred due to cognitive deficits            Plan - 01/02/18 1516    Clinical Impression Statement  Pt reaching maximal rehab potential with O.T. services due to Lt neglect and cognitive deficits    Occupational Profile and client history currently impacting functional performance  PMH: HTN, CHF, CKD (stage 2), previous Rt  CVA 2014, HOH    Occupational performance deficits (Please refer to evaluation for details):  ADL's;IADL's;Leisure;Social Participation    Rehab Potential  Fair    Current Impairments/barriers affecting progress:  severity of deficits including cognition and decreased attention and field cut to Lt side    OT Frequency  2x / week    OT Duration  8  weeks    OT Treatment/Interventions  Self-care/ADL training;DME and/or AE instruction;Moist Heat;Aquatic Therapy;Therapeutic activities;Therapeutic exercise;Cognitive remediation/compensation;Coping strategies training;Neuromuscular education;Functional Mobility Training;Passive range of motion;Visual/perceptual remediation/compensation;Manual Therapy;Patient/family education    Plan  address LTG #5 as able, plan on d/c O.T. next session    Consulted and Agree with Plan of Care  Patient;Family member/caregiver    Family Member Consulted  Wife       Patient will benefit from skilled therapeutic intervention in order to improve the following deficits and impairments:  Decreased coordination, Decreased range of motion, Difficulty walking, Improper body mechanics, Decreased endurance, Decreased safety awareness, Decreased activity tolerance, Decreased knowledge of precautions, Impaired tone, Impaired UE functional use, Decreased knowledge of use of DME, Decreased balance, Decreased cognition, Decreased mobility, Decreased strength, Impaired perceived functional ability, Impaired vision/preception  Visit Diagnosis: Visuospatial deficit  Unsteadiness on feet  Hemiplegia and hemiparesis following cerebral infarction affecting left non-dominant side [facility])    Problem List Patient Active Problem List   Diagnosis Date Noted  . 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 Foul smelling urine   . Leukocytosis   . Hyponatremia   . 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 Benign essential HTN   . Coronary artery disease involving native coronary artery of native heart without angina pectoris   . Stage 3 chronic kidney disease (HCC)   . Acute lower UTI   . Seasonal allergies   . Stroke (HCC) 11/04/2017  . HLD (hyperlipidemia) 11/04/2017  . Chronic diastolic CHF (congestive heart failure) (HCC) 11/04/2017  . Acute renal failure superimposed on stage 2 chronic kidney disease (HCC)  11/04/2017  . Acute metabolic encephalopathy 11/04/2017  . UTI (urinary tract infection) 11/04/2017  . AKI (acute kidney injury) (HCC)   . History of stroke   . Prediabetes   . Left hemiparesis (HCC) 03/19/2013  . Cerebral infarction (HCC) 03/19/2013  . Left leg weakness 03/19/2013  . CAD, ARTERY BYPASS GRAFT 09/30/2008  . 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 Borderline hyperglycemia 12/03/2007  . Other and unspecified hyperlipidemia 07/28/2007  . Essential hypertension 07/28/2007  . 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 Coronary atherosclerosis 07/28/2007  . BENIGN PROSTATIC HYPERTROPHY 07/28/2007  . COLONIC POLYPS, 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    Kelli Churn, OTR/L 01/02/2018, 3:18 PM   [facility] [address] [place], [place], [address] Phone: [phone]   Fax:  [phone]  Name: Thomas [name]. MRN: [id number] Date of Birth: [birth year]

## 2018-01-02 NOTE — Therapy (Signed)
[facility] [address] [place], [place], [address] Phone: [phone]   Fax:  [phone]  Physical Therapy Treatment  Patient Details  Name: Thomas [name]. MRN: [id number] Date of Birth: [birth year] Referring Provider: Alger Simons, MD   Encounter Date: 01/02/2018  PT End of Session - 01/02/18 1506    Visit Number  9    Number of Visits  17 eval + 2x/week for 8 weeks    Date for PT Re-Evaluation  01/27/18    Authorization Type  [employer] medicare     PT Start Time  1324    PT Stop Time  1404    PT Time Calculation (min)  41 min    Equipment Utilized During Treatment  Gait belt    Activity Tolerance  Patient tolerated treatment well    Behavior During Therapy  WFL for tasks assessed/performed       Past Medical History:  Diagnosis Date  . CHF (congestive heart failure) ([facility]) 1999  . CKD (chronic kidney disease), stage II   . Coronary artery disease   . Hx of colonic polyps   . Hyperlipidemia   . Hyperplastic colon polyp 04/15/2007  . Hypertension   . Prostatic hypertrophy, benign    with elevated PSA  . Stroke ([place])   . TIA (transient ischemic attack) 2003   "mini stroke" (03/19/2013)    Past Surgical History:  Procedure Laterality Date  . CARDIAC CATHETERIZATION    . CATARACT EXTRACTION W/ INTRAOCULAR LENS  IMPLANT, BILATERAL Bilateral 1998  . COLONOSCOPY  2008  . CORONARY ARTERY BYPASS GRAFT  1999   x4  . LOOP RECORDER INSERTION N/A 11/05/2017   Procedure: LOOP RECORDER INSERTION;  Surgeon: Thompson Grayer, MD;  Location: [facility];  Service: Cardiovascular;  Laterality: N/A;  . TEE WITHOUT CARDIOVERSION N/A 11/05/2017   Procedure: TRANSESOPHAGEAL ECHOCARDIOGRAM (TEE) WITH LOOP;  Surgeon: Dorothy Spark, MD;  Location: [facility] ENDOSCOPY;  Service: Cardiovascular;  Laterality: N/A;    There were no vitals filed for this visit.  Subjective Assessment - 01/02/18 1323    Subjective  The  patient's wife reports she tried to take RW into restaurant, but had to go get w/c.    She notes continued difficulty at home with threshold mgmt, turns, stairs near garage.      Patient Stated Goals  Moving "a whole lot easier than I am now."  Wife notes she wants him to get stronger and get back to where he can have a little more independence.    Currently in Pain?  No/denies                       [facility] Adult PT Treatment/Exercise - 01/02/18 0001      Ambulation/Gait   Ambulation/Gait  Yes    Ambulation/Gait Assistance  4: Min guard;5: Supervision    Ambulation/Gait Assistance Details  The patient needs continued verbal cues for left neglect to scan environment.    Ambulation Distance (Feet)  230 Feet    Assistive device  Rolling walker    Gait Pattern  Step-through pattern;Decreased step length - right;Decreased step length - left;Left genu recurvatum;Decreased trunk rotation;Decreased weight shift to left    Ambulation Surface  Level;Indoor    Ramp  4: Min assist    Ramp Details (indicate cue type and reason)  Cues for forward posture ascending ramp    Curb  4: Min assist    Curb  Details (indicate cue type and reason)  cues for sequencing    Gait Comments  Gait activities negotiating obstacles/cones working on left scanning, negotiating tight spaces in clinic moving between mats, on/off carpet and over thresholds with training on lifting front wheels.  The patient performed turning activities to sit and functional tasks of sit<>stand into chair without armrests and scooting chair in/out from table before sitting.       Neuro Re-ed    Neuro Re-ed Details   Standing balance and postural activities working on upright position, dec'ing UE support.  Then added left lateral stepping and return to midline, attempting to add reaching task for postural cues.  Standing on balance board, with min to modA .  Patient did not like and wanted to step down from board with min to modA.   Standing reaching activities.  Sit<.stand mutiple attempts from chair without armrest providing cues on technique and sequencing.             PT Education - 01/02/18 1506    Education provided  Yes    Education Details  demonstrated and practiced lifting walker to clear thresholds    Person(s) Educated  Patient;Caregiver(s)    Methods  Explanation;Demonstration    Comprehension  Verbalized understanding;Returned demonstration       PT Short Term Goals - 12/23/17 1019      PT SHORT TERM GOAL #1   Title  The patient will be indep with HEP for LE strengthening, posture, stretching and general mobility.    Baseline  met 12/23/17    Time  4    Period  Weeks    Status  Achieved      PT SHORT TERM GOAL #2   Title  The patient will improve Berg balance score from 7/56 to > or equal to 16/56 to demonstrate improving independence for standing balance and transfers.    Baseline  Improved to 19/56.    Time  4    Period  Weeks    Status  Achieved      PT SHORT TERM GOAL #3   Title  The patient will move sit<>stand with UE support 5/5 trials mod indep to RW.    Baseline  moderate to max progressed to mod I to RW on 12/23/17    Time  4    Period  Weeks    Status  Achieved      PT SHORT TERM GOAL #4   Title  The patient will be further assessed on gait speed and LTG to follow.    Baseline  1.23 ft/sec with RW at [facility]     Time  4    Period  Weeks    Status  Achieved      PT SHORT TERM GOAL #5   Title  The patient will ambulate x 200 ft nonstop with RW and CGA to demonstrate improving endurance and dec'd caregiver assist.    Baseline  pt able to ambulate 300' nonstop at [facility] on 12/23/17    Time  4    Period  Weeks    Status  Achieved        PT Long Term Goals - 12/26/17 1013      PT LONG TERM GOAL #1   Title  The patient will improve functional status score from 21% to > or equal to 35% to demo improved perception of functional abiltiies.    Time  8    Period  Weeks  Target Date  01/27/18      PT LONG TERM GOAL #2   Title  The patient will improve gait speed *baseline to be established after eval.    Time  8    Period  Weeks      PT LONG TERM GOAL #3   Title  The patient will improve Berg score from 7/56 to > or equal to 22/56 to demo improving standing balance for ADLs/functional tasks.    Time  8    Period  Weeks      PT LONG TERM GOAL #4   Title  The patient will negotiate 4 steps with one HR with supervision for safe entrance/exit from home.    Time  8    Period  Weeks      PT LONG TERM GOAL #5   Title  The patient will ambulate x 350 ft with RW mod indep for improved household and short distance community negotiation to reduce caregiver assist.    Time  8    Period  Weeks      PT LONG TERM GOAL #6   Title  The patient will move floor<>stand with UE support and min A due to h/o falls.    Time  8    Period  Weeks      PT LONG TERM GOAL #7   Title  Pt will improve gait speed to 1.83 ft/sec w/ LRAD at S level in order to indicate decreased fall risk.     Time  8    Period  Weeks    Status  New            Plan - 01/02/18 1507    Clinical Impression Statement  The patient has significant improvements in mobility with sit<>stand transfers demonstrating ability to negotiate chair without armrests.  PT discussed scooting forward to allow recliner sit>stand to improve per wife's report.  Patient continues with significant L knee recurvatum, however wife reports this has been present x years (from prior CVA) and patient did not want to wear KAFO.  PT to continue to LTGS.     PT Treatment/Interventions  ADLs/Self Care Home Management;Gait training;Stair training;Functional mobility training;Therapeutic activities;Therapeutic exercise;Neuromuscular re-education;Balance training;Patient/family education;Orthotic Fit/Training;Manual techniques    PT Next Visit Plan  Raquel Sarna has texted Gerald Stabs appt times to look at AFO possibilities), check HEP  periodically , seated postural strengthening, standing balance in clinic, gait training emphasizing endurance and L visual scanning to avoid obstacles    Consulted and Agree with Plan of Care  Patient       Patient will benefit from skilled therapeutic intervention in order to improve the following deficits and impairments:  Abnormal gait, Decreased endurance, Decreased activity tolerance, Decreased strength, Decreased safety awareness, Impaired flexibility, Postural dysfunction, Decreased balance, Decreased mobility  Visit Diagnosis: Unsteadiness on feet  Other abnormalities of gait and mobility  Muscle weakness (generalized)  Abnormal posture     Problem List Patient Active Problem List   Diagnosis Date Noted  . 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 Foul smelling urine   . Leukocytosis   . Hyponatremia   . Acute right PCA stroke ([facility]) 11/06/2017  . Benign essential HTN   . Coronary artery disease involving native coronary artery of native heart without angina pectoris   . Stage 3 chronic kidney disease ([place])   . Acute lower UTI   . Seasonal allergies   . Stroke ([place]) 11/04/2017  . HLD (hyperlipidemia) 11/04/2017  .  Chronic diastolic CHF (congestive heart failure) ([place]) 11/04/2017  . Acute renal failure superimposed on stage 2 chronic kidney disease ([place]) 11/04/2017  . Acute metabolic encephalopathy 90/93/1121  . UTI (urinary tract infection) 11/04/2017  . AKI (acute kidney injury) ([place])   . History of stroke   . Prediabetes   . Left hemiparesis ([place]) 03/19/2013  . Cerebral infarction ([place]) 03/19/2013  . Left leg weakness 03/19/2013  . CAD, ARTERY BYPASS GRAFT 09/30/2008  . 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 Borderline hyperglycemia 12/03/2007  . Other and unspecified hyperlipidemia 07/28/2007  . Essential hypertension 07/28/2007  . 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 Coronary atherosclerosis 07/28/2007  . BENIGN PROSTATIC HYPERTROPHY  07/28/2007  . COLONIC POLYPS, 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    Cristan Hout, PT 01/02/2018, 3:09 PM  [facility] [address] [place], [place], [address] Phone: [phone]   Fax:  [phone]  Name: Leotha Voeltz. MRN: [id number] Date of Birth: 1941-04-02

## 2018-01-03 ENCOUNTER — Telehealth: Payer: Self-pay | Admitting: Neurology

## 2018-01-03 ENCOUNTER — Encounter: Payer: Self-pay | Admitting: Vascular Surgery

## 2018-01-03 ENCOUNTER — Ambulatory Visit (INDEPENDENT_AMBULATORY_CARE_PROVIDER_SITE_OTHER): Payer: Medicare Other | Admitting: Vascular Surgery

## 2018-01-03 ENCOUNTER — Other Ambulatory Visit: Payer: Self-pay

## 2018-01-03 VITALS — BP 87/57 | HR 63 | Resp 20 | Ht 71.0 in | Wt 162.0 lb

## 2018-01-03 DIAGNOSIS — I6523 Occlusion and stenosis of bilateral carotid arteries: Secondary | ICD-10-CM

## 2018-01-03 NOTE — Telephone Encounter (Signed)
Correction. Dr. Riley Kill diagnose pt with vascular dementia on 12/30/2017. See epic notes.

## 2018-01-03 NOTE — Progress Notes (Signed)
Patient ID: Thomas Alba., male   DOB: [birth year], 77 y.o.   MRN: [id number]  Reason for Consult: New Patient (Initial Visit) (eval carotid stenosis - Delle Reining, PA)   Referred by Shirline Frees, NP  Subjective:     HPI:  Thomas [place]. is a 77 y.o. male with remote history of TIA and then a right-sided stroke in 2014 followed by a larger stroke in March of this year.  Since that stroke he has persistent visual loss as well as weakness in his left side.  He also has not returned to his normal self with significant confusion, sleep disturbance and behavioral disturbance.  He has never had a left-sided stroke.  He is now on dual antiplatelet therapy and takes a statin drug does not take blood thinners.  He is able to walk with the help of a walker and needs assistance with his activities of daily living.  All information was obtained from his wife.  Past Medical History:  Diagnosis Date  . CHF (congestive heart failure) ([facility]) 1999  . CKD (chronic kidney disease), stage II   . Coronary artery disease   . Hx of colonic polyps   . Hyperlipidemia   . Hyperplastic colon polyp 04/15/2007  . Hypertension   . Prostatic hypertrophy, benign    with elevated PSA  . Stroke ([facility])   . TIA (transient ischemic attack) 2003   "mini stroke" (03/19/2013)   Family History  Problem Relation Age of Onset  . Coronary artery disease Mother   . Heart disease Mother   . Stroke Father   . Heart disease Father   . Heart disease Brother    Past Surgical History:  Procedure Laterality Date  . CARDIAC CATHETERIZATION    . CATARACT EXTRACTION W/ INTRAOCULAR LENS  IMPLANT, BILATERAL Bilateral 1998  . COLONOSCOPY  2008  . CORONARY ARTERY BYPASS GRAFT  1999   x4  . LOOP RECORDER INSERTION N/A 11/05/2017   Procedure: LOOP RECORDER INSERTION;  Surgeon: Hillis Range, MD;  Location: [facility];  Service: Cardiovascular;  Laterality: N/A;  . TEE WITHOUT CARDIOVERSION N/A 11/05/2017   Procedure: TRANSESOPHAGEAL ECHOCARDIOGRAM (TEE) WITH LOOP;  Surgeon: Lars Masson, MD;  Location: [facility] ENDOSCOPY;  Service: Cardiovascular;  Laterality: N/A;    Short Social History:  Social History   Tobacco Use  . Smoking status: Former Smoker    Packs/day: 0.75    Years: 50.00    Pack years: 37.50    Types: Cigarettes    Last attempt to quit: 03/19/2013    Years since quitting: 4.7  . Smokeless tobacco: Never Used  Substance Use Topics  . Alcohol use: Yes    Alcohol/week: 0.6 oz    Types: 1 Cans of beer per week    Comment: 03/19/2013 "1 beer/week" does not drink anymore    Allergies  Allergen Reactions  . Sulfonamide Derivatives     REACTION: Weak,lethargic    Current Outpatient Medications  Medication Sig Dispense Refill  . acetaminophen (TYLENOL) 325 MG tablet Take 1-2 tablets (325-650 mg total) by mouth every 4 (four) hours as needed for mild pain.    Marland Kitchen aspirin EC 325 MG EC tablet Take 1 tablet (325 mg total) by mouth daily. 30 tablet 0  . atorvastatin (LIPITOR) 80 MG tablet Take 1 tablet (80 mg total) by mouth daily. (Patient taking differently: Take 80 mg by mouth daily at 6 PM. )    . bethanechol (URECHOLINE) 10 MG  tablet Take 1 tablet (10 mg total) by mouth 3 (three) times daily. 90 tablet 0  . clopidogrel (PLAVIX) 75 MG tablet TAKE 1 TABLET BY MOUTH  DAILY WITH BREAKFAST (Patient taking differently: TAKE 1 TABLET (75mg ) BY MOUTH  DAILY WITH BREAKFAST) 90 tablet 3  . divalproex (DEPAKOTE) 250 MG DR tablet Take 1 tablet (250 mg total) by mouth 2 (two) times daily. 60 tablet 4  . Flax OIL Take 1 capsule by mouth daily.      . fluconazole (DIFLUCAN) 100 MG tablet Take 1 tablet (100 mg total) by mouth 2 (two) times daily. 60 tablet 0  . fluticasone (FLONASE) 50 MCG/ACT nasal spray Place 2 sprays into both nostrils daily as needed for allergies or rhinitis.  2  . folic acid (FOLVITE) 1 MG tablet TAKE 1 TABLET EVERY DAY (Patient taking differently: TAKE 1 TABLET (1mg ) by  mouth EVERY DAY) 90 tablet 3  . hydrocerin (EUCERIN) CREA Apply 1 application topically 2 (two) times daily.  0  . ketoconazole (NIZORAL) 2 % cream Apply 1 application topically 2 (two) times daily. 60 g 1  . loratadine (CLARITIN) 10 MG tablet Take 10 mg by mouth at bedtime.     . metoprolol tartrate (LOPRESSOR) 50 MG tablet TAKE 1 TABLET BY MOUTH TWO  TIMES DAILY (Patient taking differently: TAKE 1 TABLET (50mg ) BY MOUTH TWO  TIMES DAILY) 180 tablet 3  . Omega-3 Fatty Acids (FISH OIL) 1000 MG CAPS Take 1 capsule by mouth daily.      . QUEtiapine (SEROQUEL) 25 MG tablet Take 1 tablet (25 mg total) by mouth at bedtime.     No current facility-administered medications for this visit.     Review of Systems  Constitutional:  Constitutional negative. HENT: HENT negative.  Eyes: Positive for loss of vision.   Respiratory: Respiratory negative.  Cardiovascular: Positive for claudication.  GI: Gastrointestinal negative.  Musculoskeletal: Positive for leg pain.  Skin: Skin negative.  Neurological: Positive for focal weakness and numbness.  Hematologic: Hematologic/lymphatic negative.  Psychiatric: Positive for behavioral problem, confusion, depressed mood and sleep disturbance.        Objective:  Objective   Vitals:   01/03/18 0936 01/03/18 0943  BP: (!) 94/57 (!) 87/57  Pulse: 63   Resp: 20   SpO2: 100%   Weight: 162 lb (73.5 kg)   Height: 5\' 11"  (1.803 m)    Body mass index is 22.59 kg/m.  Physical Exam  Constitutional: He appears well-developed.  HENT:  Head: Normocephalic.  Neck: Neck supple.  Cardiovascular: Normal rate.  Pulses:      Radial pulses are 2+ on the right side, and 2+ on the left side.       Femoral pulses are 2+ on the right side, and 2+ on the left side. Pulmonary/Chest: Effort normal.  Abdominal: Soft.  Musculoskeletal: Normal range of motion. He exhibits no edema.  Neurological:  He is awake and alert Normal grip strength bilaterally  Skin: Skin is  warm and dry.  Psychiatric:  His behavior is normal but is confused on exam.    Data: I reviewed his CT scan which demonstrates high-grade stenosis of his left internal carotid artery is heavily calcified and there is a string sign.  There is also distal stenosis in the supra clinoid left ICA and there is heavy calcification there as well.     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  Overall this patient has had progressive decline since his stroke in March with evidence of dementia with both behavioral disturbances as well as sleep disturbance.  Given that this is an asymptomatic carotid stenosis he is probably best suited for maximal medical therapy with dual antiplatelet therapy and statin drug which she is already on.  Certainly if this becomes symptomatic with TIA or stroke we could consider treating or we could also consider if he has significant overall improvement.  I discussed this with his wife and she is in agreement to not proceed with any surgical intervention at this time.  I will have him follow-up in 1 year with repeat carotid duplex for evaluation.     Maeola Harman MD [facility]

## 2018-01-03 NOTE — Telephone Encounter (Signed)
Rn call patients wife on dpr about vascular dementia diagnosis. Rn stated our office cannot advise on the vascular dementia. RN stated per epic notes pt was seen by Dr. Riley Kill on 12/30/2017 for behavior and memory. Rn ask if she call Dr Riley Kill office. The wife stated she call Dr Riley Kill office  yesterday but no one has call her back. Rn advised the wife to call Dr. Riley Kill again because he is the one that saw her husband for  Office visit  on 12/30/2017, and discuss vascular dementia,and behavior issues.The wife will call Dr. Riley Kill office,and appreciate the call.

## 2018-01-03 NOTE — Telephone Encounter (Signed)
Pt wife(on DPR) has called  She is asking for a call from RN to know if it is in pt's chart if he has been diagnosed with vascular dementia.  Please call

## 2018-01-08 ENCOUNTER — Encounter: Payer: Self-pay | Admitting: Rehabilitative and Restorative Service Providers"

## 2018-01-08 ENCOUNTER — Ambulatory Visit: Payer: Medicare Other | Admitting: Rehabilitative and Restorative Service Providers"

## 2018-01-08 ENCOUNTER — Ambulatory Visit (INDEPENDENT_AMBULATORY_CARE_PROVIDER_SITE_OTHER): Payer: Medicare Other | Admitting: *Deleted

## 2018-01-08 ENCOUNTER — Ambulatory Visit: Payer: Medicare Other | Admitting: Occupational Therapy

## 2018-01-08 DIAGNOSIS — R2689 Other abnormalities of gait and mobility: Secondary | ICD-10-CM

## 2018-01-08 DIAGNOSIS — I6340 Cerebral infarction due to embolism of unspecified cerebral artery: Secondary | ICD-10-CM | POA: Diagnosis not present

## 2018-01-08 DIAGNOSIS — I69318 Other symptoms and signs involving cognitive functions following cerebral infarction: Secondary | ICD-10-CM

## 2018-01-08 DIAGNOSIS — R41842 Visuospatial deficit: Secondary | ICD-10-CM

## 2018-01-08 DIAGNOSIS — M6281 Muscle weakness (generalized): Secondary | ICD-10-CM

## 2018-01-08 DIAGNOSIS — R2681 Unsteadiness on feet: Secondary | ICD-10-CM

## 2018-01-08 DIAGNOSIS — I69354 Hemiplegia and hemiparesis following cerebral infarction affecting left non-dominant side: Secondary | ICD-10-CM

## 2018-01-08 NOTE — Therapy (Signed)
[facility] [address] [place], [place], [address] Phone: [phone]   Fax:  [phone]  Occupational Therapy Treatment  Patient Details  Name: Thomas [name]. MRN: [id number] Date of Birth: Aug 07, 1941 Referring Provider: Alger Simons, MD   Encounter Date: 01/08/2018  OT End of Session - 01/08/18 1218    Visit Number  11    Number of Visits  17    Authorization Type  [employer] MCR    OT Start Time  0930    OT Stop Time  1015    OT Time Calculation (min)  45 min    Activity Tolerance  Patient tolerated treatment well    Behavior During Therapy  [facility] for tasks assessed/performed       Past Medical History:  Diagnosis Date  . CHF (congestive heart failure) ([place]) 1999  . CKD (chronic kidney disease), stage II   . Coronary artery disease   . Hx of colonic polyps   . Hyperlipidemia   . Hyperplastic colon polyp 04/15/2007  . Hypertension   . Prostatic hypertrophy, benign    with elevated PSA  . Stroke ([place])   . TIA (transient ischemic attack) 2003   "mini stroke" (03/19/2013)    Past Surgical History:  Procedure Laterality Date  . CARDIAC CATHETERIZATION    . CATARACT EXTRACTION W/ INTRAOCULAR LENS  IMPLANT, BILATERAL Bilateral 1998  . COLONOSCOPY  2008  . CORONARY ARTERY BYPASS GRAFT  1999   x4  . LOOP RECORDER INSERTION N/A 11/05/2017   Procedure: LOOP RECORDER INSERTION;  Surgeon: Thompson Grayer, MD;  Location: [facility];  Service: Cardiovascular;  Laterality: N/A;  . TEE WITHOUT CARDIOVERSION N/A 11/05/2017   Procedure: TRANSESOPHAGEAL ECHOCARDIOGRAM (TEE) WITH LOOP;  Surgeon: Dorothy Spark, MD;  Location[facility] END[facility]COPY;  Service: Cardiovascular;  Laterality: N/A;    There were no vitals filed for this visit.  Subjective Assessment - 01/08/18 0939    Subjective   Per wife: pt has had 2 falls (1 Monday, 1 yesterday)     Patient is accompained by:  Family member wife    Pertinent History   Rt CVA 11/03/17. PMH: HTN, CHF, CKD stage 2, previous CVA 2014 with residual Lt LE weakness    Limitations  LOOP RECORDER, cognitive deficits    Patient Stated Goals  Get back to doing things I used to do    Currently in Pain?  No/denies                   OT Treatments/Exercises (OP) - 01/08/18 0001      ADLs   ADL Comments  Pt/wife wish to continue to d/c O.T. today (despite recent falls, as P.T. is addressing further). Pt/wife in agreement that pt has reached plateau with O.T. progress       Cognitive Exercises   Other Cognitive Exercises 1  Playing solitare for visual scanning, working memory, attention - pt required mod v.c's to continue to scan far Lt side and for working memory      Functional Reaching Activities   Mid Level  Mid level reaching LUE to place checkers into Connect 4 slots for forced use Lt hand, visual scanning to Lt following pattern. Pt only needed 1 cue to scan Lt and moderate cueing to follow pattern               OT Short Term Goals - 01/08/18 1219      OT SHORT TERM GOAL #  1   Title  Pt/family will verbalize understanding with visual scanning strategies and memory compensatory strategies    Time  4    Period  Weeks    Status  Achieved      OT SHORT TERM GOAL #2   Title  Pt/family will verbalize understanding with coordination HEP for bilateral hands    Time  4    Period  Weeks    Status  Achieved      OT SHORT TERM GOAL #3   Title  Pt will perform LE dressing with min assist    Time  4    Period  Weeks    Status  Achieved pt doing only with supervision      OT SHORT TERM GOAL #4   Title  Pt will perform toilet transfer with min assist and clothes management with no more than mod assist    Time  4    Period  Weeks    Status  Achieved supervision only      OT SHORT TERM GOAL #5   Title  Pt will attend to Lt side for near body and tabletop scanning with > 75% accuracy    Time  4    Period  Weeks    Status  Not Met Pt is not  consistent with this, although tabletop scanning has improved        OT Long Term Goals - 01/08/18 1219      OT LONG TERM GOAL #1   Title  Independent with updated HEP for UE's    Time  8    Period  Weeks    Status  Deferred      OT LONG TERM GOAL #2   Title  Pt will perform LE dressing with set up/sup only    Time  8    Period  Weeks    Status  Achieved      OT LONG TERM GOAL #3   Title  Pt will perform toilet transfer and tub transfer with close sup only using DME prn    Time  8    Period  Weeks    Status  Achieved      OT LONG TERM GOAL #4   Title  Pt will perform clothes management after toileting supervision level    Time  8    Period  Weeks    Status  Achieved      OT LONG TERM GOAL #5   Title  Pt will perform simple snack prep at supervision level using rollator or tray on walker    Time  8    Period  Weeks    Status  Not Met Pt unable to perform safely d/t deficits LT LE      OT LONG TERM GOAL #6   Title  Pt to perform medication management with direct sup/min cueing prn    Time  8    Period  Weeks    Status  Deferred due to cognitive deficits            Plan - 01/08/18 1220    Clinical Impression Statement  Pt reaching maximal rehab potential with O.T. services due to Lt neglect and cognitive deficits    Occupational Profile and client history currently impacting functional performance  PMH: HTN, CHF, CKD (stage 2), previous Rt CVA 2014, HOH    Rehab Potential  Fair    Current Impairments/barriers affecting progress:  severity of deficits including cognition and  decreased attention and field cut to Lt side    OT Treatment/Interventions  Self-care/ADL training;DME and/or AE instruction;Moist Heat;Aquatic Therapy;Therapeutic activities;Therapeutic exercise;Cognitive remediation/compensation;Coping strategies training;Neuromuscular education;Functional Mobility Training;Passive range of motion;Visual/perceptual remediation/compensation;Manual  Therapy;Patient/family education    Plan  D/C O.Donnajean Lopes and Agree with Plan of Care  Patient;Family member/caregiver    Family Member Consulted  Wife       Patient will benefit from skilled therapeutic intervention in order to improve the following deficits and impairments:  Decreased coordination, Decreased range of motion, Difficulty walking, Improper body mechanics, Decreased endurance, Decreased safety awareness, Decreased activity tolerance, Decreased knowledge of precautions, Impaired tone, Impaired UE functional use, Decreased knowledge of use of DME, Decreased balance, Decreased cognition, Decreased mobility, Decreased strength, Impaired perceived functional ability, Impaired vision/preception  Visit Diagnosis: Visuospatial deficit  Hemiplegia and hemiparesis following cerebral infarction affecting left non-dominant side (HCC)  Other symptoms and signs involving cognitive functions following cerebral infarction    Problem List Patient Active Problem List   Diagnosis Date Noted  . 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 Foul smelling urine   . Leukocytosis   . Hyponatremia   . Acute right PCA stroke ([place]) 11/06/2017  . Benign essential HTN   . Coronary artery disease involving native coronary artery of native heart without angina pectoris   . Stage 3 chronic kidney disease ([place])   . Acute lower UTI   . Seasonal allergies   . Stroke ([place]) 11/04/2017  . HLD (hyperlipidemia) 11/04/2017  . Chronic diastolic CHF (congestive heart failure) ([place]) 11/04/2017  . Acute renal failure superimposed on stage 2 chronic kidney disease ([place]) 11/04/2017  . Acute metabolic encephalopathy 53/66/4403  . UTI (urinary tract infection) 11/04/2017  . AKI (acute kidney injury) ([place])   . History of stroke   . Prediabetes   . Left hemiparesis ([place]) 03/19/2013  . Cerebral infarction ([place]) 03/19/2013  . Left leg weakness 03/19/2013  . CAD, ARTERY BYPASS GRAFT 09/30/2008   . 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 Borderline hyperglycemia 12/03/2007  . Other and unspecified hyperlipidemia 07/28/2007  . Essential hypertension 07/28/2007  . 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 Coronary atherosclerosis 07/28/2007  . BENIGN PROSTATIC HYPERTROPHY 07/28/2007  . COLONIC POLYPS, 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   OCCUPATIONAL THERAPY DISCHARGE SUMMARY  Visits from Start of Care: 11  Current functional level related to goals / functional outcomes: SEE ABOVE   Remaining deficits:  LT neglect Unsteadiness on feet Cognition   Education / Equipment: Pt/wife educated in HEP's, visual compensatory strategies, and safety  Plan: Patient agrees to discharge.  Patient goals were partially met. Patient is being discharged due to lack of progress.  ?????        Carey Bullocks, OTR/L 01/08/2018, 12:22 PM  [facility] [address] [place], [place], [address] Phone: [phone]   Fax:  [phone]  Name: Jadore Veals. MRN: [id number] Date of Birth: 04-Aug-1941

## 2018-01-08 NOTE — Therapy (Signed)
[facility] [address] [place], [place], [address] Phone: [phone]   Fax:  [phone]  Physical Therapy Treatment  Patient Details  Name: Thomas [name]. MRN: [id number] Date of Birth: Jun 14, 1941 Referring Provider: Alger Simons, MD   Encounter Date: 01/08/2018  PT End of Session - 01/08/18 1115    Visit Number  10    Number of Visits  17 eval + 2x/week for 8 weeks    Date for PT Re-Evaluation  01/27/18    Authorization Type  UHC medicare     PT Start Time  1023    PT Stop Time  1103    PT Time Calculation (min)  40 min    Equipment Utilized During Treatment  Gait belt    Activity Tolerance  Patient tolerated treatment well    Behavior During Therapy  WFL for tasks assessed/performed       Past Medical History:  Diagnosis Date  . CHF (congestive heart failure) ([place]) 1999  . CKD (chronic kidney disease), stage II   . Coronary artery disease   . Hx of colonic polyps   . Hyperlipidemia   . Hyperplastic colon polyp 04/15/2007  . Hypertension   . Prostatic hypertrophy, benign    with elevated PSA  . Stroke ([place])   . TIA (transient ischemic attack) 2003   "mini stroke" (03/19/2013)    Past Surgical History:  Procedure Laterality Date  . CARDIAC CATHETERIZATION    . CATARACT EXTRACTION W/ INTRAOCULAR LENS  IMPLANT, BILATERAL Bilateral 1998  . COLONOSCOPY  2008  . CORONARY ARTERY BYPASS GRAFT  1999   x4  . LOOP RECORDER INSERTION N/A 11/05/2017   Procedure: LOOP RECORDER INSERTION;  Surgeon: Thompson Grayer, MD;  Location: [facility];  Service: Cardiovascular;  Laterality: N/A;  . TEE WITHOUT CARDIOVERSION N/A 11/05/2017   Procedure: TRANSESOPHAGEAL ECHOCARDIOGRAM (TEE) WITH LOOP;  Surgeon: Dorothy Spark, MD;  Location: [facility] ENDOSCOPY;  Service: Cardiovascular;  Laterality: N/A;    There were no vitals filed for this visit.  Subjective Assessment - 01/08/18 1036    Subjective  The  patient has had 2 falls this week.  One on Monday when bending to turn night light on (even though wife told him not to) and one when getting up on his own when wife outdoors with dog.   EMS was activated on Monday and patient signed wavier to decline going to the hospital.  He hit his head during fall.      Pertinent History  CKD, prior CVA using RW prior to recent admission, HTN, CHF.    Patient Stated Goals  Moving "a whole lot easier than I am now."  Wife notes she wants him to get stronger and get back to where he can have a little more independence.    Currently in Pain?  No/denies                       [facility] Adult PT Treatment/Exercise - 01/08/18 1055      Transfers   Transfers  Sit to Stand;Stand to Sit    Sit to Stand  5: Supervision    Stand to Sit  5: Supervision      Ambulation/Gait   Ambulation/Gait  Yes    Ambulation/Gait Assistance  4: Min guard;4: Min assist    Ambulation/Gait Assistance Details  The patient trialed Walk On posterior AFO x 115 feet with RW due to wife's report  that hte left leg is not moving right.  We discussed trying AFO.  Patient notes he has a brace at home.  He has not worn x 5 years (after first CVA).  We also walked without AFO x 115 feet with RW and min A.  Patient advances the leg better without the brace, however has a narrow base with L foot IR and adduction leading to right foot catching left foot when advancing during swing phase.  The patient's gait was not significantly improved with this AFO- plan to trial others.    Ambulation Distance (Feet)  230 Feet    Assistive device  Rolling walker    Gait Pattern  Step-through pattern;Decreased step length - right;Decreased step length - left;Left genu recurvatum;Decreased trunk rotation;Decreased weight shift to left    Ambulation Surface  Level;Indoor      Self-Care   Self-Care  Other Self-Care Comments    Other Self-Care Comments   Patient and patient's wife + PT discussed falls and  home safety.  Falls appear to be occuring when doing things his wife asks him not to and when he gets up alone.  PT recommended she remove night lights with switches on them from the house and replace with light sensor night lights to take away option for patient to bend to turn them on.  Also recommended/ reviewed prior options of chair alarm or baby monitor/ video monitor as patient is high fall risk and continues to get up when not supposed to.              PT Education - 01/08/18 1115    Education provided  Yes    Education Details  reprinted prior HEP and recommended patient return to doing these daily    Person(s) Educated  Patient;Caregiver(s)    Methods  Explanation    Comprehension  Verbalized understanding       PT Short Term Goals - 12/23/17 1019      PT SHORT TERM GOAL #1   Title  The patient will be indep with HEP for LE strengthening, posture, stretching and general mobility.    Baseline  met 12/23/17    Time  4    Period  Weeks    Status  Achieved      PT SHORT TERM GOAL #2   Title  The patient will improve Berg balance score from 7/56 to > or equal to 16/56 to demonstrate improving independence for standing balance and transfers.    Baseline  Improved to 19/56.    Time  4    Period  Weeks    Status  Achieved      PT SHORT TERM GOAL #3   Title  The patient will move sit<>stand with UE support 5/5 trials mod indep to RW.    Baseline  moderate to max progressed to mod I to RW on 12/23/17    Time  4    Period  Weeks    Status  Achieved      PT SHORT TERM GOAL #4   Title  The patient will be further assessed on gait speed and LTG to follow.    Baseline  1.23 ft/sec with RW at [facility]     Time  4    Period  Weeks    Status  Achieved      PT SHORT TERM GOAL #5   Title  The patient will ambulate x 200 ft nonstop with RW and CGA to demonstrate improving endurance  and dec'd caregiver assist.    Baseline  pt able to ambulate 300' nonstop at [facility] on 12/23/17    Time  4     Period  Weeks    Status  Achieved        PT Long Term Goals - 12/26/17 1013      PT LONG TERM GOAL #1   Title  The patient will improve functional status score from 21% to > or equal to 35% to demo improved perception of functional abiltiies.    Time  8    Period  Weeks    Target Date  01/27/18      PT LONG TERM GOAL #2   Title  The patient will improve gait speed *baseline to be established after eval.    Time  8    Period  Weeks      PT LONG TERM GOAL #3   Title  The patient will improve Berg score from 7/56 to > or equal to 22/56 to demo improving standing balance for ADLs/functional tasks.    Time  8    Period  Weeks      PT LONG TERM GOAL #4   Title  The patient will negotiate 4 steps with one HR with supervision for safe entrance/exit from home.    Time  8    Period  Weeks      PT LONG TERM GOAL #5   Title  The patient will ambulate x 350 ft with RW mod indep for improved household and short distance community negotiation to reduce caregiver assist.    Time  8    Period  Weeks      PT LONG TERM GOAL #6   Title  The patient will move floor<>stand with UE support and min A due to h/o falls.    Time  8    Period  Weeks      PT LONG TERM GOAL #7   Title  Pt will improve gait speed to 1.83 ft/sec w/ LRAD at S level in order to indicate decreased fall risk.     Time  8    Period  Weeks    Status  New            Plan - 01/08/18 1116    Clinical Impression Statement  The patient has been showing significant improvement in mobility until this week.  He is requiring some increased assist to min A for gait activities and verbal cues for L foot/LE placement and advancing the left leg.  The patient's falls appear to be more related to dec'd attention and making poor decisions + being impulsive.  PT and family discussed ways to reduce falls using chair alarm or video monitor.  Also reviewed recommendations to consider adult center for enrichment for some respite care  for patient..  Patient's wife to consider options.     PT Treatment/Interventions  ADLs/Self Care Home Management;Gait training;Stair training;Functional mobility training;Therapeutic activities;Therapeutic exercise;Neuromuscular re-education;Balance training;Patient/family education;Orthotic Fit/Training;Manual techniques    PT Next Visit Plan  Raquel Sarna has texted Gerald Stabs appt times to look at AFO possibilities), check HEP periodically , seated postural strengthening, standing balance in clinic, gait training emphasizing endurance and L visual scanning to avoid obstacles    Consulted and Agree with Plan of Care  Patient       Patient will benefit from skilled therapeutic intervention in order to improve the following deficits and impairments:  Abnormal gait, Decreased endurance, Decreased activity tolerance, Decreased  strength, Decreased safety awareness, Impaired flexibility, Postural dysfunction, Decreased balance, Decreased mobility  Visit Diagnosis: Unsteadiness on feet  Other abnormalities of gait and mobility  Muscle weakness (generalized)     Problem List Patient Active Problem List   Diagnosis Date Noted  . 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 Foul smelling urine   . Leukocytosis   . Hyponatremia   . Acute right PCA stroke ([place]) 11/06/2017  . Benign essential HTN   . Coronary artery disease involving native coronary artery of native heart without angina pectoris   . Stage 3 chronic kidney disease ([place])   . Acute lower UTI   . Seasonal allergies   . Stroke ([place]) 11/04/2017  . HLD (hyperlipidemia) 11/04/2017  . Chronic diastolic CHF (congestive heart failure) ([place]) 11/04/2017  . Acute renal failure superimposed on stage 2 chronic kidney disease ([place]) 11/04/2017  . Acute metabolic encephalopathy 39/67/2897  . UTI (urinary tract infection) 11/04/2017  . AKI (acute kidney injury) ([place])   . History of stroke   . Prediabetes   . Left hemiparesis ([place])  03/19/2013  . Cerebral infarction ([place]) 03/19/2013  . Left leg weakness 03/19/2013  . CAD, ARTERY BYPASS GRAFT 09/30/2008  . 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 Borderline hyperglycemia 12/03/2007  . Other and unspecified hyperlipidemia 07/28/2007  . Essential hypertension 07/28/2007  . 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 Coronary atherosclerosis 07/28/2007  . BENIGN PROSTATIC HYPERTROPHY 07/28/2007  . COLONIC POLYPS, 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    Yolani Vo, PT 01/08/2018, 11:19 AM  [facility] [address] [place], [place], [address] Phone: [phone]   Fax:  [phone]  Name: Kupono Marling. MRN: [id number] Date of Birth: Dec 14, 1940

## 2018-01-09 ENCOUNTER — Telehealth: Payer: Self-pay | Admitting: Adult Health

## 2018-01-09 NOTE — Telephone Encounter (Signed)
Noted  

## 2018-01-09 NOTE — Progress Notes (Addendum)
No transmission  

## 2018-01-09 NOTE — Telephone Encounter (Signed)
Pts wife dropped off a [employer] [facility] Medical Examination Report form.  Upon completion pts wife would like to be called to pick up the form at [phone].  Form was placed in the doctors folder.

## 2018-01-10 ENCOUNTER — Encounter: Payer: Self-pay | Admitting: Rehabilitative and Restorative Service Providers"

## 2018-01-10 ENCOUNTER — Encounter: Payer: Medicare Other | Admitting: Occupational Therapy

## 2018-01-10 ENCOUNTER — Ambulatory Visit: Payer: Medicare Other | Admitting: Rehabilitative and Restorative Service Providers"

## 2018-01-10 DIAGNOSIS — I69318 Other symptoms and signs involving cognitive functions following cerebral infarction: Secondary | ICD-10-CM

## 2018-01-10 NOTE — Progress Notes (Signed)
Carelink Summary Report / Loop Recorder 

## 2018-01-10 NOTE — Therapy (Signed)
[facility] [address] [place], [place], [address] Phone: [phone]   Fax:  [phone]  Physical Therapy Treatment  Patient Details  Name: Thomas [name]. MRN: [id number] Date of Birth: 05-25-1941 Referring Provider: Alger Simons, MD   Encounter Date: 01/10/2018  PT End of Session - 01/10/18 0856    Visit Number  -- no charge today    Number of Visits  17    Date for PT Re-Evaluation  01/27/18    Authorization Type  [employer] medicare     PT Start Time  0845    PT Stop Time  0850    PT Time Calculation (min)  5 min    Activity Tolerance  Treatment limited secondary to medical complications (Comment)       Past Medical History:  Diagnosis Date  . CHF (congestive heart failure) ([place]) 1999  . CKD (chronic kidney disease), stage II   . Coronary artery disease   . Hx of colonic polyps   . Hyperlipidemia   . Hyperplastic colon polyp 04/15/2007  . Hypertension   . Prostatic hypertrophy, benign    with elevated PSA  . Stroke ([place])   . TIA (transient ischemic attack) 2003   "mini stroke" (03/19/2013)    Past Surgical History:  Procedure Laterality Date  . CARDIAC CATHETERIZATION    . CATARACT EXTRACTION W/ INTRAOCULAR LENS  IMPLANT, BILATERAL Bilateral 1998  . COLONOSCOPY  2008  . CORONARY ARTERY BYPASS GRAFT  1999   x4  . LOOP RECORDER INSERTION N/A 11/05/2017   Procedure: LOOP RECORDER INSERTION;  Surgeon: Thompson Grayer, MD;  Location: [facility];  Service: Cardiovascular;  Laterality: N/A;  . TEE WITHOUT CARDIOVERSION N/A 11/05/2017   Procedure: TRANSESOPHAGEAL ECHOCARDIOGRAM (TEE) WITH LOOP;  Surgeon: Dorothy Spark, MD;  Location: [facility] ENDOSCOPY;  Service: Cardiovascular;  Laterality: N/A;    There were no vitals filed for this visit.  Subjective Assessment - 01/10/18 0848    Subjective  Patient arrives to therapy today with red, swollen eyes and redness noted at the lower lid.  He has some  fluid accumulation worse around the right eye.  The patient's wife reports that he will not let her call to get it looked at and will not let her put drops in eyes.  PT discussed infection present and risk of injury to eyes or a more systemic infection if not treated.  Patient's wife called eye doctor and made appointment.                 PT Short Term Goals - 12/23/17 1019      PT SHORT TERM GOAL #1   Title  The patient will be indep with HEP for LE strengthening, posture, stretching and general mobility.    Baseline  met 12/23/17    Time  4    Period  Weeks    Status  Achieved      PT SHORT TERM GOAL #2   Title  The patient will improve Berg balance score from 7/56 to > or equal to 16/56 to demonstrate improving independence for standing balance and transfers.    Baseline  Improved to 19/56.    Time  4    Period  Weeks    Status  Achieved      PT SHORT TERM GOAL #3   Title  The patient will move sit<>stand with UE support 5/5 trials mod indep to RW.    Baseline  moderate to max progressed to mod I to RW on 12/23/17    Time  4    Period  Weeks    Status  Achieved      PT SHORT TERM GOAL #4   Title  The patient will be further assessed on gait speed and LTG to follow.    Baseline  1.23 ft/sec with RW at [facility]     Time  4    Period  Weeks    Status  Achieved      PT SHORT TERM GOAL #5   Title  The patient will ambulate x 200 ft nonstop with RW and CGA to demonstrate improving endurance and dec'd caregiver assist.    Baseline  pt able to ambulate 300' nonstop at [facility] on 12/23/17    Time  4    Period  Weeks    Status  Achieved        PT Long Term Goals - 12/26/17 1013      PT LONG TERM GOAL #1   Title  The patient will improve functional status score from 21% to > or equal to 35% to demo improved perception of functional abiltiies.    Time  8    Period  Weeks    Target Date  01/27/18      PT LONG TERM GOAL #2   Title  The patient will improve gait speed *baseline  to be established after eval.    Time  8    Period  Weeks      PT LONG TERM GOAL #3   Title  The patient will improve Berg score from 7/56 to > or equal to 22/56 to demo improving standing balance for ADLs/functional tasks.    Time  8    Period  Weeks      PT LONG TERM GOAL #4   Title  The patient will negotiate 4 steps with one HR with supervision for safe entrance/exit from home.    Time  8    Period  Weeks      PT LONG TERM GOAL #5   Title  The patient will ambulate x 350 ft with RW mod indep for improved household and short distance community negotiation to reduce caregiver assist.    Time  8    Period  Weeks      PT LONG TERM GOAL #6   Title  The patient will move floor<>stand with UE support and min A due to h/o falls.    Time  8    Period  Weeks      PT LONG TERM GOAL #7   Title  Pt will improve gait speed to 1.83 ft/sec w/ LRAD at S level in order to indicate decreased fall risk.     Time  8    Period  Weeks    Status  New            Plan - 01/10/18 0856    Clinical Impression Statement  Cancelled session today until after eye infection assessed by MD.       Patient will benefit from skilled therapeutic intervention in order to improve the following deficits and impairments:     Visit Diagnosis: Other symptoms and signs involving cognitive functions following cerebral infarction     Problem List Patient Active Problem List   Diagnosis Date Noted  . 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 Foul smelling urine   . Leukocytosis   . Hyponatremia   .  Acute right PCA stroke ([place]) 11/06/2017  . Benign essential HTN   . Coronary artery disease involving native coronary artery of native heart without angina pectoris   . Stage 3 chronic kidney disease ([place])   . Acute lower UTI   . Seasonal allergies   . Stroke ([place]) 11/04/2017  . HLD (hyperlipidemia) 11/04/2017  . Chronic diastolic CHF (congestive heart failure) ([place]) 11/04/2017  .  Acute renal failure superimposed on stage 2 chronic kidney disease ([place]) 11/04/2017  . Acute metabolic encephalopathy 76/19/5093  . UTI (urinary tract infection) 11/04/2017  . AKI (acute kidney injury) ([place])   . History of stroke   . Prediabetes   . Left hemiparesis ([place]) 03/19/2013  . Cerebral infarction ([place]) 03/19/2013  . Left leg weakness 03/19/2013  . CAD, ARTERY BYPASS GRAFT 09/30/2008  . 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 Borderline hyperglycemia 12/03/2007  . Other and unspecified hyperlipidemia 07/28/2007  . Essential hypertension 07/28/2007  . 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 Coronary atherosclerosis 07/28/2007  . BENIGN PROSTATIC HYPERTROPHY 07/28/2007  . COLONIC POLYPS, 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    Chadley Dziedzic, PT 01/10/2018, 8:57 AM  [facility] [address] [place], [place], [address] Phone: [phone]   Fax:  [phone]  Name: Thomas [name]. MRN: [id number] Date of Birth: 1941/02/12

## 2018-01-13 ENCOUNTER — Ambulatory Visit
Payer: Medicare Other | Attending: Physical Medicine & Rehabilitation | Admitting: Rehabilitative and Restorative Service Providers"

## 2018-01-13 ENCOUNTER — Encounter: Payer: Self-pay | Admitting: Rehabilitative and Restorative Service Providers"

## 2018-01-13 ENCOUNTER — Ambulatory Visit: Payer: Medicare Other | Admitting: Speech Pathology

## 2018-01-13 ENCOUNTER — Encounter: Payer: Medicare Other | Admitting: Occupational Therapy

## 2018-01-13 DIAGNOSIS — R293 Abnormal posture: Secondary | ICD-10-CM | POA: Diagnosis present

## 2018-01-13 DIAGNOSIS — M6281 Muscle weakness (generalized): Secondary | ICD-10-CM | POA: Insufficient documentation

## 2018-01-13 DIAGNOSIS — R2689 Other abnormalities of gait and mobility: Secondary | ICD-10-CM | POA: Diagnosis present

## 2018-01-13 DIAGNOSIS — R41841 Cognitive communication deficit: Secondary | ICD-10-CM | POA: Insufficient documentation

## 2018-01-13 DIAGNOSIS — R2681 Unsteadiness on feet: Secondary | ICD-10-CM | POA: Insufficient documentation

## 2018-01-13 NOTE — Therapy (Signed)
[facility] [address] [place], [place], [address] Phone: [phone]   Fax:  [phone]  Speech Language Pathology Treatment  Patient Details  Name: Thomas [name]. MRN: [id number] Date of Birth: 1940-12-19 Referring Provider: Ranelle Oyster, MD   Encounter Date: 01/13/2018  End of Session - 01/13/18 1731    Visit Number  4    Number of Visits  17    Date for SLP Re-Evaluation  03/07/18    SLP Start Time  1533    SLP Stop Time   1615    SLP Time Calculation (min)  42 min    Activity Tolerance  Patient tolerated treatment well       Past Medical History:  Diagnosis Date  . CHF (congestive heart failure) ([facility]) 1999  . CKD (chronic kidney disease), stage II   . Coronary artery disease   . Hx of colonic polyps   . Hyperlipidemia   . Hyperplastic colon polyp 04/15/2007  . Hypertension   . Prostatic hypertrophy, benign    with elevated PSA  . Stroke ([facility])   . TIA (transient ischemic attack) 2003   "mini stroke" (03/19/2013)    Past Surgical History:  Procedure Laterality Date  . CARDIAC CATHETERIZATION    . CATARACT EXTRACTION W/ INTRAOCULAR LENS  IMPLANT, BILATERAL Bilateral 1998  . COLONOSCOPY  2008  . CORONARY ARTERY BYPASS GRAFT  1999   x4  . LOOP RECORDER INSERTION N/A 11/05/2017   Procedure: LOOP RECORDER INSERTION;  Surgeon: Hillis Range, MD;  Location: [facility];  Service: Cardiovascular;  Laterality: N/A;  . TEE WITHOUT CARDIOVERSION N/A 11/05/2017   Procedure: TRANSESOPHAGEAL ECHOCARDIOGRAM (TEE) WITH LOOP;  Surgeon: Lars Masson, MD;  Location: [facility]COPY;  Service: Cardiovascular;  Laterality: N/A;    There were no vitals filed for this visit.  Subjective Assessment - 01/13/18 1538    Subjective  "She got me to play solitaire at home."    Currently in Pain?  No/denies            ADULT SLP TREATMENT - 01/13/18 1533      General Information   Behavior/Cognition  Alert;Cooperative;Pleasant mood    Patient Positioning  Upright in chair      Treatment Provided   Treatment provided  Cognitive-Linquistic      Cognitive-Linquistic Treatment   Treatment focused on  Cognition    Skilled Treatment  SLP facilitated sustained attention with counting change; mod-max A for L attention. Pt confided in SLP his frustration with deficits since CVA. SLP provided encouragement, acknowledging pt's ability to persist in therapy task even when challenging. Pt stated he has missed gardening and attending the farmers' market, where he has helped friends in the past. SLP provided education and suggestions for ways pt could continue to be involved with activities he previously enjoyed. Functional reasoning, sustained attention targeted as pt helped to make a list of seasonable vegetables and list prices.       Assessment / Recommendations / Plan   Plan  Continue with current plan of care      Progression Toward Goals   Progression toward goals  Progressing toward goals         SLP Short Term Goals - 01/13/18 1731      SLP SHORT TERM GOAL #1   Title  Pt will attend to simple cognitive linguistic tasks in mildly distracting environment for 10 minutes over 2 sessions     Baseline  12/14/17; 01/13/09    Time  3    Period  Weeks    Status  Achieved      SLP SHORT TERM GOAL #2   Title  Pt will ID and correct 3/5 errors on simple cognitive linguistic tasks with occasional min A over 2 sessions.     Time  3    Period  Weeks    Status  On-going      SLP SHORT TERM GOAL #3   Title  Pt will complete mildy complex organization, reasoning, problem solving tasks with 85% accuracy and occasional min A    Time  3    Period  Weeks    Status  On-going      SLP SHORT TERM GOAL #4   Title  Pt will utilize external aids for functional recall and schedule management with rare min A over 2 sessions    Time  3    Period  Weeks    Status  On-going       SLP  Long Term Goals - 01/13/18 1732      SLP LONG TERM GOAL #1   Title  Pt will alternate attention between 2 simple cognitive linguistic tasks with 80% accuracy on each and occasional min A     Time  7    Period  Weeks    Status  On-going      SLP LONG TERM GOAL #2   Title  Pt will self correct errors 4/5 errors with occasional min A     Time  7    Period  Weeks    Status  On-going      SLP LONG TERM GOAL #3   Title   Pt will report daily functional and effective use of compensatory strategies for recall of daily activities and appointments    Time  7    Period  Weeks    Status  On-going      SLP LONG TERM GOAL #4   Title  Spouse will demonstrate 2 strategies to improve pt's attending to and follow through of her directions/safety precautions with rare min A over 3 sessions    Time  7    Period  Weeks    Status  On-going       Plan - 01/13/18 1731    Clinical Impression Statement  Pt continues with significant cognitive deficits in attention, executive function, awareness affecting his safety judgement and awareness. Continue skilled ST to maximize safety and reduce caregiver burden.     Speech Therapy Frequency  2x / week    Treatment/Interventions  Cognitive reorganization;Compensatory strategies;Patient/family education;SLP instruction and feedback;Functional tasks;Internal/external aids;Cueing hierarchy    Potential to Achieve Goals  Fair    Potential Considerations  Severity of impairments;Ability to learn/carryover information;Previous level of function    Consulted and Agree with Plan of Care  Patient;Family member/caregiver       Patient will benefit from skilled therapeutic intervention in order to improve the following deficits and impairments:   Cognitive communication deficit    Problem List Patient Active Problem List   Diagnosis Date Noted  . 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 Foul smelling urine   . Leukocytosis   . Hyponatremia   .  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 Benign essential HTN   . Coronary artery disease involving native coronary artery of native heart without angina pectoris   . Stage 3 chronic kidney disease ([facility])   .  Acute lower UTI   . Seasonal allergies   . Stroke ([facility]) 11/04/2017  . HLD (hyperlipidemia) 11/04/2017  . Chronic diastolic CHF (congestive heart failure) ([facility]) 11/04/2017  . Acute renal failure superimposed on stage 2 chronic kidney disease (H[facility]) 11/04/2017  . Acute metabolic encephalopathy 11/04/2017  . UTI (urinary tract infection) 11/04/2017  . AKI (acute kidney injury) ([facility])   . History of stroke   . Prediabetes   . Left hemiparesis ([facility]) 03/19/2013  . Cerebral infarction ([facility]) 03/19/2013  . Left leg weakness 03/19/2013  . CAD, ARTERY BYPASS GRAFT 09/30/2008  . 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 Borderline hyperglycemia 12/03/2007  . Other and unspecified hyperlipidemia 07/28/2007  . Essential hypertension 07/28/2007  . 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 Coronary atherosclerosis 07/28/2007  . BENIGN PROSTATIC HYPERTROPHY 07/28/2007  . COLONIC POLYPS, 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   Rondel Baton, MS, CCC-SLP Speech-Language Pathologist  Arlana Lindau 01/13/2018, 5:33 PM  [facility] [address] [place], [place], [address] Phone: [phone]   Fax:  [phone]   Name: Thomas [name]. MRN: [id number] Date of Birth: Nov 19, 1940

## 2018-01-13 NOTE — Therapy (Signed)
[facility] [address] [place], [place], [address] Phone: [phone]   Fax:  [phone]  Physical Therapy Treatment  Patient Details  Name: Thomas [name]. MRN: [id number] Date of Birth: March 08, 1941 Referring Provider: Alger Simons, MD   Encounter Date: 01/13/2018  PT End of Session - 01/13/18 2020    Visit Number  11    Number of Visits  17    Date for PT Re-Evaluation  01/27/18    Authorization Type  [employer] medicare     PT Start Time  0935    PT Stop Time  1015    PT Time Calculation (min)  40 min    Equipment Utilized During Treatment  Gait belt    Activity Tolerance  Patient tolerated treatment well    Behavior During Therapy  [facility] for tasks assessed/performed       Past Medical History:  Diagnosis Date  . CHF (congestive heart failure) ([place]) 1999  . CKD (chronic kidney disease), stage II   . Coronary artery disease   . Hx of colonic polyps   . Hyperlipidemia   . Hyperplastic colon polyp 04/15/2007  . Hypertension   . Prostatic hypertrophy, benign    with elevated PSA  . Stroke ([place])   . TIA (transient ischemic attack) 2003   "mini stroke" (03/19/2013)    Past Surgical History:  Procedure Laterality Date  . CARDIAC CATHETERIZATION    . CATARACT EXTRACTION W/ INTRAOCULAR LENS  IMPLANT, BILATERAL Bilateral 1998  . COLONOSCOPY  2008  . CORONARY ARTERY BYPASS GRAFT  1999   x4  . LOOP RECORDER INSERTION N/A 11/05/2017   Procedure: LOOP RECORDER INSERTION;  Surgeon: Thompson Grayer, MD;  Location: [facility] LAB;  Service: Cardiovascular;  Laterality: N/A;  . TEE WITHOUT CARDIOVERSION N/A 11/05/2017   Procedure: TRANSESOPHAGEAL ECHOCARDIOGRAM (TEE) WITH LOOP;  Surgeon: Dorothy Spark, MD;  Location: [facility] ENDOSCOPY;  Service: Cardiovascular;  Laterality: N/A;    There were no vitals filed for this visit.  Subjective Assessment - 01/13/18 0940    Subjective  The patient reports no falls.  He saw  eye doctor Friday and eye inflammation was related to allergic reaction.      Pertinent History  CKD, prior CVA using RW prior to recent admission, HTN, CHF.    Patient Stated Goals  Moving "a whole lot easier than I am now."  Wife notes she wants him to get stronger and get back to where he can have a little more independence.    Currently in Pain?  No/denies                       [facility] Adult PT Treatment/Exercise - 01/13/18 2021      Transfers   Transfers  Sit to Stand;Stand to Sit    Sit to Stand  5: Supervision;4: Min assist;4: Min guard    Stand to Sit  5: Supervision    Comments  The patient initially required min A to move sit>stand today and with repetition and after postural stretches was able to perform with CGA to supervision level.  PT emphasized repetition performing 5 reps without UE support (after stretching) and demonstrating exaggerated upright posture.      Ambulation/Gait   Ambulation/Gait  Yes    Ambulation/Gait Assistance  4: Min assist;4: Min guard    Ambulation/Gait Assistance Details  The patient required CGA for ambulation with RW.  PT provided tactile cues L  hip for ER.    Ambulation Distance (Feet)  230 Feet 115 x 2 reps    Assistive device  Rolling walker    Gait Pattern  Step-through pattern;Decreased step length - right;Decreased step length - left;Left genu recurvatum;Decreased trunk rotation;Decreased weight shift to left    Ambulation Surface  Level;Indoor      Self-Care   Self-Care  Other Self-Care Comments    Other Self-Care Comments   Patient's wife notes he seems to be fatiguing faster with gait so she is having him use wheelchair for longer distances.  PT recommends frequent, short bouts of walking with supervision/CGA for maintaining mobility and hopefully returning to prior ambulation (before falls last week).       Neuro Re-ed    Neuro Re-ed Details   Sitting on physioball performing trunk stabilization working on lateral  trunk/pelvic mobiliity and ant/posterior mobility.  Sitting with UE reaching on physioall for core stabilization.  Standing alternating UE reaching.        Exercises   Exercises  Other Exercises    Other Exercises   Sitting thoracic extension with PT providing trunk elongation using physioball for support.  Seated scapular retraction with tactile cues.   Sit<>stand x 5 reps x 2 sets.                  PT Short Term Goals - 12/23/17 1019      PT SHORT TERM GOAL #1   Title  The patient will be indep with HEP for LE strengthening, posture, stretching and general mobility.    Baseline  met 12/23/17    Time  4    Period  Weeks    Status  Achieved      PT SHORT TERM GOAL #2   Title  The patient will improve Berg balance score from 7/56 to > or equal to 16/56 to demonstrate improving independence for standing balance and transfers.    Baseline  Improved to 19/56.    Time  4    Period  Weeks    Status  Achieved      PT SHORT TERM GOAL #3   Title  The patient will move sit<>stand with UE support 5/5 trials mod indep to RW.    Baseline  moderate to max progressed to mod I to RW on 12/23/17    Time  4    Period  Weeks    Status  Achieved      PT SHORT TERM GOAL #4   Title  The patient will be further assessed on gait speed and LTG to follow.    Baseline  1.23 ft/sec with RW at [facility]     Time  4    Period  Weeks    Status  Achieved      PT SHORT TERM GOAL #5   Title  The patient will ambulate x 200 ft nonstop with RW and CGA to demonstrate improving endurance and dec'd caregiver assist.    Baseline  pt able to ambulate 300' nonstop at [facility] on 12/23/17    Time  4    Period  Weeks    Status  Achieved        PT Long Term Goals - 12/26/17 1013      PT LONG TERM GOAL #1   Title  The patient will improve functional status score from 21% to > or equal to 35% to demo improved perception of functional abiltiies.    Time  8  Period  Weeks    Target Date  01/27/18      PT LONG  TERM GOAL #2   Title  The patient will improve gait speed *baseline to be established after eval.    Time  8    Period  Weeks      PT LONG TERM GOAL #3   Title  The patient will improve Berg score from 7/56 to > or equal to 22/56 to demo improving standing balance for ADLs/functional tasks.    Time  8    Period  Weeks      PT LONG TERM GOAL #4   Title  The patient will negotiate 4 steps with one HR with supervision for safe entrance/exit from home.    Time  8    Period  Weeks      PT LONG TERM GOAL #5   Title  The patient will ambulate x 350 ft with RW mod indep for improved household and short distance community negotiation to reduce caregiver assist.    Time  8    Period  Weeks      PT LONG TERM GOAL #6   Title  The patient will move floor<>stand with UE support and min A due to h/o falls.    Time  8    Period  Weeks      PT LONG TERM GOAL #7   Title  Pt will improve gait speed to 1.83 ft/sec w/ LRAD at S level in order to indicate decreased fall risk.     Time  8    Period  Weeks    Status  New            Plan - 01/13/18 2046    Clinical Impression Statement  The patient shows improvement within session on sit<>stand transfers.  He was initially posterior leaning today, and after stretching was able to demonstrate more upright posture to improve balance.  The patient appears to have had a decrease in mobility after falls last week and PT working on improving mobility to return to pre-falls level of function.  Also emphasizing fall prevention with patient and spouse.     PT Treatment/Interventions  ADLs/Self Care Home Management;Gait training;Stair training;Functional mobility training;Therapeutic activities;Therapeutic exercise;Neuromuscular re-education;Balance training;Patient/family education;Orthotic Fit/Training;Manual techniques    PT Next Visit Plan  Check HEP periodically, seated postural strengthening, standing balance, gait training, L visual scanning     Consulted and Agree with Plan of Care  Patient       Patient will benefit from skilled therapeutic intervention in order to improve the following deficits and impairments:  Abnormal gait, Decreased endurance, Decreased activity tolerance, Decreased strength, Decreased safety awareness, Impaired flexibility, Postural dysfunction, Decreased balance, Decreased mobility  Visit Diagnosis: Unsteadiness on feet  Other abnormalities of gait and mobility  Muscle weakness (generalized)  Abnormal posture     Problem List Patient Active Problem List   Diagnosis Date Noted  . 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 Foul smelling urine   . Leukocytosis   . Hyponatremia   . Acute right PCA stroke ([place]) 11/06/2017  . Benign essential HTN   . Coronary artery disease involving native coronary artery of native heart without angina pectoris   . Stage 3 chronic kidney disease ([place])   . Acute lower UTI   . Seasonal allergies   . Stroke ([place]) 11/04/2017  . HLD (hyperlipidemia) 11/04/2017  . Chronic diastolic CHF (congestive heart failure) ([place]) 11/04/2017  . Acute renal  failure superimposed on stage 2 chronic kidney disease ([place]) 11/04/2017  . Acute metabolic encephalopathy 67/42/5525  . UTI (urinary tract infection) 11/04/2017  . AKI (acute kidney injury) ([place])   . History of stroke   . Prediabetes   . Left hemiparesis ([place]) 03/19/2013  . Cerebral infarction ([place]) 03/19/2013  . Left leg weakness 03/19/2013  . CAD, ARTERY BYPASS GRAFT 09/30/2008  . 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 Borderline hyperglycemia 12/03/2007  . Other and unspecified hyperlipidemia 07/28/2007  . Essential hypertension 07/28/2007  . 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 Coronary atherosclerosis 07/28/2007  . BENIGN PROSTATIC HYPERTROPHY 07/28/2007  . COLONIC POLYPS, 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    Oshua Mcconaha, PT 01/13/2018, 8:50 PM  [facility] [address] [place], [place], [address] Phone: [phone]   Fax:  [phone]  Name: Firas Guardado. MRN: [id number] Date of Birth: [birth year]

## 2018-01-14 NOTE — Telephone Encounter (Signed)
Given to [facility] to finish filling out.

## 2018-01-16 ENCOUNTER — Ambulatory Visit: Payer: Medicare Other | Admitting: Rehabilitative and Restorative Service Providers"

## 2018-01-16 ENCOUNTER — Encounter: Payer: Self-pay | Admitting: Rehabilitative and Restorative Service Providers"

## 2018-01-16 ENCOUNTER — Encounter: Payer: Medicare Other | Admitting: Occupational Therapy

## 2018-01-16 DIAGNOSIS — R2689 Other abnormalities of gait and mobility: Secondary | ICD-10-CM

## 2018-01-16 DIAGNOSIS — M6281 Muscle weakness (generalized): Secondary | ICD-10-CM

## 2018-01-16 DIAGNOSIS — R2681 Unsteadiness on feet: Secondary | ICD-10-CM

## 2018-01-16 NOTE — Therapy (Signed)
[facility] [address] [place], [place], [address] Phone: [phone]   Fax:  [phone]  Physical Therapy Treatment  Patient Details  Name: Thomas [name]. MRN: [id number] Date of Birth: March 28, 1941 Referring Provider: Alger Simons, MD   Encounter Date: 01/16/2018  PT End of Session - 01/16/18 1215    Visit Number  12    Number of Visits  17    Date for PT Re-Evaluation  01/27/18    Authorization Type  [employer] medicare     PT Start Time  1018    PT Stop Time  1100    PT Time Calculation (min)  42 min    Equipment Utilized During Treatment  Gait belt    Activity Tolerance  Patient tolerated treatment well    Behavior During Therapy  WFL for tasks assessed/performed       Past Medical History:  Diagnosis Date  . CHF (congestive heart failure) ([place]) 1999  . CKD (chronic kidney disease), stage II   . Coronary artery disease   . Hx of colonic polyps   . Hyperlipidemia   . Hyperplastic colon polyp 04/15/2007  . Hypertension   . Prostatic hypertrophy, benign    with elevated PSA  . Stroke ([place])   . TIA (transient ischemic attack) 2003   "mini stroke" (03/19/2013)    Past Surgical History:  Procedure Laterality Date  . CARDIAC CATHETERIZATION    . CATARACT EXTRACTION W/ INTRAOCULAR LENS  IMPLANT, BILATERAL Bilateral 1998  . COLONOSCOPY  2008  . CORONARY ARTERY BYPASS GRAFT  1999   x4  . LOOP RECORDER INSERTION N/A 11/05/2017   Procedure: LOOP RECORDER INSERTION;  Surgeon: Thompson Grayer, MD;  Location: [facility];  Service: Cardiovascular;  Laterality: N/A;  . TEE WITHOUT CARDIOVERSION N/A 11/05/2017   Procedure: TRANSESOPHAGEAL ECHOCARDIOGRAM (TEE) WITH LOOP;  Surgeon: Dorothy Spark, MD;  Location: [facility] ENDOSCOPY;  Service: Cardiovascular;  Laterality: N/A;    There were no vitals filed for this visit.  Subjective Assessment - 01/16/18 1018    Subjective  The patient's wife reports he slid to  the floor after sitting on edge of chair (not far enough back).  His eyes are red again (notes not using ointment from eye doctor yesterday or today).    Patient is accompained by:  Family member wife    Pertinent History  CKD, prior CVA using RW prior to recent admission, HTN, CHF.    Patient Stated Goals  Moving "a whole lot easier than I am now."  Wife notes she wants him to get stronger and get back to where he can have a little more independence.    Currently in Pain?  No/denies                       [facility] Adult PT Treatment/Exercise - 01/16/18 1215      Ambulation/Gait   Ambulation/Gait  Yes    Ambulation/Gait Assistance  4: Min assist;4: Min guard    Ambulation Distance (Feet)  75 Feet    Assistive device  Rolling walker    Gait Pattern  Step-through pattern;Decreased step length - right;Decreased step length - left;Left genu recurvatum;Decreased trunk rotation;Decreased weight shift to left    Ambulation Surface  Level      Self-Care   Self-Care  Other Self-Care Comments    Other Self-Care Comments   PT, patient and his wife discussed current goal date of 01/27/18.  We discussed that at d/c from PT the patient will continue to need 24 hour supervision.  Our current goals focus on maintaining mobility, while keeping patient safe.  We further discussed home safety and patient's lack of awareness and behavioral choices that increase his fall risk.  We also discussed gym routine as his wife feels getting into regular routine at [facility] would be beneficial.  Seated stepper was tried today and patient safe to perform if his wife sets him up on the machine.        Exercises   Exercises  Other Exercises    Other Exercises   Supine towel roll stretch, seated hamstring stretch, supine bridges reviewed from HEP, supine heel slides reviewed from HEP, seated towel stretch gastrocs, sit<>stand x 10 reps.  Trialed nu-step x 5 minutes with bilat UEs/LEs with level 4 resistance.                PT Education - 01/16/18 1214    Education provided  Yes    Education Details  added towel roll stretch for posture to current HEP    Person(s) Educated  Patient;Caregiver(s);Spouse    Methods  Explanation;Demonstration;Handout    Comprehension  Verbalized understanding;Returned demonstration;Verbal cues required patient's wife to assist at home       PT Short Term Goals - 12/23/17 1019      PT SHORT TERM GOAL #1   Title  The patient will be indep with HEP for LE strengthening, posture, stretching and general mobility.    Baseline  met 12/23/17    Time  4    Period  Weeks    Status  Achieved      PT SHORT TERM GOAL #2   Title  The patient will improve Berg balance score from 7/56 to > or equal to 16/56 to demonstrate improving independence for standing balance and transfers.    Baseline  Improved to 19/56.    Time  4    Period  Weeks    Status  Achieved      PT SHORT TERM GOAL #3   Title  The patient will move sit<>stand with UE support 5/5 trials mod indep to RW.    Baseline  moderate to max progressed to mod I to RW on 12/23/17    Time  4    Period  Weeks    Status  Achieved      PT SHORT TERM GOAL #4   Title  The patient will be further assessed on gait speed and LTG to follow.    Baseline  1.23 ft/sec with RW at [facility]     Time  4    Period  Weeks    Status  Achieved      PT SHORT TERM GOAL #5   Title  The patient will ambulate x 200 ft nonstop with RW and CGA to demonstrate improving endurance and dec'd caregiver assist.    Baseline  pt able to ambulate 300' nonstop at [facility] on 12/23/17    Time  4    Period  Weeks    Status  Achieved        PT Long Term Goals - 12/26/17 1013      PT LONG TERM GOAL #1   Title  The patient will improve functional status score from 21% to > or equal to 35% to demo improved perception of functional abiltiies.    Time  8    Period  Weeks    Target  Date  01/27/18      PT LONG TERM GOAL #2   Title  The patient will  improve gait speed *baseline to be established after eval.    Time  8    Period  Weeks      PT LONG TERM GOAL #3   Title  The patient will improve Berg score from 7/56 to > or equal to 22/56 to demo improving standing balance for ADLs/functional tasks.    Time  8    Period  Weeks      PT LONG TERM GOAL #4   Title  The patient will negotiate 4 steps with one HR with supervision for safe entrance/exit from home.    Time  8    Period  Weeks      PT LONG TERM GOAL #5   Title  The patient will ambulate x 350 ft with RW mod indep for improved household and short distance community negotiation to reduce caregiver assist.    Time  8    Period  Weeks      PT LONG TERM GOAL #6   Title  The patient will move floor<>stand with UE support and min A due to h/o falls.    Time  8    Period  Weeks      PT LONG TERM GOAL #7   Title  Pt will improve gait speed to 1.83 ft/sec w/ LRAD at S level in order to indicate decreased fall risk.     Time  8    Period  Weeks    Status  New            Plan - 01/16/18 1215    Clinical Impression Statement  Today's session emphasized review of current HEP, discussion of d/c planning, discussion of home safety, discussion of community exercise and post d/c home exercise plan.  PT to continue working to [employer].     PT Treatment/Interventions  ADLs/Self Care Home Management;Gait training;Stair training;Functional mobility training;Therapeutic activities;Therapeutic exercise;Neuromuscular re-education;Balance training;Patient/family education;Orthotic Fit/Training;Manual techniques    PT Next Visit Plan  *Instruct in towel roll for lumbar support to improve positioning in recliner and w/c. , seated postural strengthening, standing balance, gait training, L visual scanning. d/c planning (gym with wife's assist)    Consulted and Agree with Plan of Care  Patient       Patient will benefit from skilled therapeutic intervention in order to improve the following  deficits and impairments:  Abnormal gait, Decreased endurance, Decreased activity tolerance, Decreased strength, Decreased safety awareness, Impaired flexibility, Postural dysfunction, Decreased balance, Decreased mobility  Visit Diagnosis: Other abnormalities of gait and mobility  Unsteadiness on feet  Muscle weakness (generalized)     Problem List Patient Active Problem List   Diagnosis Date Noted  . 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 Foul smelling urine   . Leukocytosis   . Hyponatremia   . Acute right PCA stroke ([place]) 11/06/2017  . Benign essential HTN   . Coronary artery disease involving native coronary artery of native heart without angina pectoris   . Stage 3 chronic kidney disease ([place])   . Acute lower UTI   . Seasonal allergies   . Stroke ([place]) 11/04/2017  . HLD (hyperlipidemia) 11/04/2017  . Chronic diastolic CHF (congestive heart failure) ([place]) 11/04/2017  . Acute renal failure superimposed on stage 2 chronic kidney disease ([place]) 11/04/2017  . Acute metabolic encephalopathy 41/66/0630  . UTI (urinary tract infection) 11/04/2017  . AKI (  acute kidney injury) ([place])   . History of stroke   . Prediabetes   . Left hemiparesis ([employer]) 03/19/2013  . Cerebral infarction ([place]) 03/19/2013  . Left leg weakness 03/19/2013  . CAD, ARTERY BYPASS GRAFT 09/30/2008  . 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 Borderline hyperglycemia 12/03/2007  . Other and unspecified hyperlipidemia 07/28/2007  . Essential hypertension 07/28/2007  . 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 Coronary atherosclerosis 07/28/2007  . BENIGN PROSTATIC HYPERTROPHY 07/28/2007  . COLONIC POLYPS, 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    Rayshaun Needle, PT 01/16/2018, 12:24 PM  [facility] [address] [place], [place], [address] Phone: [phone]   Fax:  [phone]  Name: Zakaree Mcclenahan. MRN: [id number] Date of Birth:  1941-07-23

## 2018-01-16 NOTE — Telephone Encounter (Signed)
pts wife came and picked up the completed form.

## 2018-01-16 NOTE — Patient Instructions (Signed)
Bridging    Slowly raise buttocks from floor, keeping stomach tight. Add a pillow (you may have to fold it) and place between knees and squeeze pillow while you lift butt.  Don't drop the pillow! Repeat _10___ times per set. Do __1__ sets per session. Do _2___ sessions per day.  http://orth.exer.us/1096  Copyright  VHI. All rights reserved.  Sit to Stand: Head Upright    With head upright, stand up slowly with eyes open.Place chair against wall for safety. Repeat __10__ times per session. Do __2__ sessions per day.  Copyright  VHI. All rights reserved.  Heel Slides    Squeeze pelvic floor and hold. Slide left heel along bed towards bottom. Hold for __5_ seconds. Slide back to flat knee position. Repeat __10_ times. Do _2__ times a day. Repeat with other leg.Make sure leg stays in line with body.    Copyright  VHI. All rights reserved.  Knee Extension (Hamstring Stretch) With Pelvis Neutral    Slowly and gently bring foot upand place on step stool. Be sure pelvis does not tip backward (flex) or rotate. Sit up tall and try to get knee straight. Hold _30-45__ seconds. Do _3__ times, each leg, _2__ times per day.  http://ss.exer.us/88  Copyright  VHI. All rights reserved.  Gastroc / Heel Cord Stretch - Seated With Towel    Sitin chair with foot on stool,place strap ortowel around ball of foot. Gently pull foot in toward body, stretching heel cord and calf. Hold for _30-45__ seconds. Repeat on involved leg. Repeat _3__ times. Do __2_ times per day.  Copyright  VHI. All rights reserved.   Thoracic Self-Mobilization (Supine)    With rolled towel placed lengthwise at lower ribs level, lie back on towel with arms outstretched. Hold _2 minutes. Relax. Repeat __1-2__ times per day as needed for postural stretching.  http://orth.exer.us/1001   Copyright  VHI. All rights reserved.

## 2018-01-20 ENCOUNTER — Encounter: Payer: Medicare Other | Admitting: Occupational Therapy

## 2018-01-20 ENCOUNTER — Ambulatory Visit: Payer: Medicare Other | Admitting: Rehabilitation

## 2018-01-20 ENCOUNTER — Encounter: Payer: Self-pay | Admitting: Rehabilitation

## 2018-01-20 DIAGNOSIS — R293 Abnormal posture: Secondary | ICD-10-CM

## 2018-01-20 DIAGNOSIS — M6281 Muscle weakness (generalized): Secondary | ICD-10-CM

## 2018-01-20 DIAGNOSIS — R2681 Unsteadiness on feet: Secondary | ICD-10-CM | POA: Diagnosis not present

## 2018-01-20 DIAGNOSIS — R2689 Other abnormalities of gait and mobility: Secondary | ICD-10-CM

## 2018-01-20 NOTE — Therapy (Signed)
[facility] [address] [place], [place], [address] Phone: [phone]   Fax:  [phone]  Physical Therapy Treatment  Patient Details  Name: Thomas [name]. MRN: [id number] Date of Birth: [birth year] Referring Provider: Alger Simons, MD   Encounter Date: 01/20/2018  PT End of Session - 01/20/18 1338    Visit Number  13    Number of Visits  17    Date for PT Re-Evaluation  01/27/18    Authorization Type  [employer] medicare     PT Start Time  1018    PT Stop Time  1100    PT Time Calculation (min)  42 min    Equipment Utilized During Treatment  Gait belt    Activity Tolerance  Patient tolerated treatment well    Behavior During Therapy  WFL for tasks assessed/performed       Past Medical History:  Diagnosis Date  . CHF (congestive heart failure) ([place]) 1999  . CKD (chronic kidney disease), stage II   . Coronary artery disease   . Hx of colonic polyps   . Hyperlipidemia   . Hyperplastic colon polyp 04/15/2007  . Hypertension   . Prostatic hypertrophy, benign    with elevated PSA  . Stroke ([place])   . TIA (transient ischemic attack) 2003   "mini stroke" (03/19/2013)    Past Surgical History:  Procedure Laterality Date  . CARDIAC CATHETERIZATION    . CATARACT EXTRACTION W/ INTRAOCULAR LENS  IMPLANT, BILATERAL Bilateral 1998  . COLONOSCOPY  2008  . CORONARY ARTERY BYPASS GRAFT  1999   x4  . LOOP RECORDER INSERTION N/A 11/05/2017   Procedure: LOOP RECORDER INSERTION;  Surgeon: Thompson Grayer, MD;  Location: [facility];  Service: Cardiovascular;  Laterality: N/A;  . TEE WITHOUT CARDIOVERSION N/A 11/05/2017   Procedure: TRANSESOPHAGEAL ECHOCARDIOGRAM (TEE) WITH LOOP;  Surgeon: Dorothy Spark, MD;  Location: [facility]OSCOPY;  Service: Cardiovascular;  Laterality: N/A;    There were no vitals filed for this visit.  Subjective Assessment - 01/20/18 1053    Subjective  Pts wife reports he is not walking  very far at home before he is attempting to sit.  She is having to bring chairs with her and have chairs strategically placed around house in case he gets ready to sit.      Patient is accompained by:  Family member    Pertinent History  CKD, prior CVA using RW prior to recent admission, HTN, CHF.    Patient Stated Goals  Moving "a whole lot easier than I am now."  Wife notes she wants him to get stronger and get back to where he can have a little more independence.    Currently in Pain?  No/denies                       [facility] Adult PT Treatment/Exercise - 01/20/18 0001      Transfers   Transfers  Sit to Stand;Stand to Sit    Sit to Stand  5: Supervision    Sit to Stand Details  Verbal cues for sequencing;Verbal cues for technique;Verbal cues for precautions/safety    Sit to Stand Details (indicate cue type and reason)  Continue to provide cues for hand placement.     Stand to Sit  5: Supervision    Stand to Sit Details (indicate cue type and reason)  Verbal cues for sequencing;Verbal cues for technique;Verbal cues for precautions/safety  Ambulation/Gait   Ambulation/Gait  Yes    Ambulation/Gait Assistance  4: Min guard;4: Min assist    Ambulation/Gait Assistance Details  Pt able to tolerate ambulation around therapy track (x 115") including stopping in therapy kitchen to emphasize safety at home with use of RW rather than grabbing onto counter or other objects rather than RW.  He did very well wtih short single seated rest break.  Pt able to negotiate in kitchen, making stops, side stepping with RW, and reaching for microwave to open/close while inside of rW.  Wife reports he is not doing this much at home.  He continues to yell at wife and attempts to sit when fatigued.      Ambulation Distance (Feet)  100 Feet then another 17' then another 40' to/from nustep   Assistive device  Rolling walker    Gait Pattern  Step-through pattern;Decreased step length - right;Decreased  step length - left;Left genu recurvatum;Decreased trunk rotation;Decreased weight shift to left    Ambulation Surface  Level;Indoor    Gait Comments  Continue to emphasize large steps, upright posture, and PT needing to intermittent assist with RW to avoid him pushing too far ahead due to L foot catching.  Note this more at end of session when fatigued.        Self-Care   Self-Care  Other Self-Care Comments    Other Self-Care Comments   PT continues to problem solve mobility issues at home, esp with pts cognitive deficits and yelling at wife when she does try to assist.  PT educated that he may have to remain at w/c level in home if he continues to be unsafe and reach for counter/other furniture when walking.  Also discussed walking with pt while wife assists with one hand and brings transport chair behind her so that if they are walking in home, she can pull it up behind him if he begins to sit.  Pts wife states she has done this before.  She also states she would like to keep him as mobile as possible.  Discussed safety for pt and her and PT also wondering if when/if they make it to [facility] for seated work out, they can work on walking here as he can have more open area and she could have him walk from lobby to w/c.  Will continue to problem solve with this issue.  Wife reports that [facility] and that she has filled out all paper work for this.         Therex: seated nustep x 5 mins with BUE/LEs at level 4 resistance maintaining steps per minute in 60's.  Pt tolerated very well.      PT Education - 01/20/18 1338    Education provided  Yes    Education Details  see self care    Person(s) Educated  Patient;Spouse    Methods  Explanation    Comprehension  Verbalized understanding       PT Short Term Goals - 12/23/17 1019      PT SHORT TERM GOAL #1   Title  The patient will be indep with HEP for LE strengthening, posture, stretching and general  mobility.    Baseline  met 12/23/17    Time  4    Period  Weeks    Status  Achieved      PT SHORT TERM GOAL #2   Title  The patient will improve Berg balance score from 7/56 to >  or equal to 16/56 to demonstrate improving independence for standing balance and transfers.    Baseline  Improved to 19/56.    Time  4    Period  Weeks    Status  Achieved      PT SHORT TERM GOAL #3   Title  The patient will move sit<>stand with UE support 5/5 trials mod indep to RW.    Baseline  moderate to max progressed to mod I to RW on 12/23/17    Time  4    Period  Weeks    Status  Achieved      PT SHORT TERM GOAL #4   Title  The patient will be further assessed on gait speed and LTG to follow.    Baseline  1.23 ft/sec with RW at [facility]     Time  4    Period  Weeks    Status  Achieved      PT SHORT TERM GOAL #5   Title  The patient will ambulate x 200 ft nonstop with RW and CGA to demonstrate improving endurance and dec'd caregiver assist.    Baseline  pt able to ambulate 300' nonstop at [facility] on 12/23/17    Time  4    Period  Weeks    Status  Achieved        PT Long Term Goals - 12/26/17 1013      PT LONG TERM GOAL #1   Title  The patient will improve functional status score from 21% to > or equal to 35% to demo improved perception of functional abiltiies.    Time  8    Period  Weeks    Target Date  01/27/18      PT LONG TERM GOAL #2   Title  The patient will improve gait speed *baseline to be established after eval.    Time  8    Period  Weeks      PT LONG TERM GOAL #3   Title  The patient will improve Berg score from 7/56 to > or equal to 22/56 to demo improving standing balance for ADLs/functional tasks.    Time  8    Period  Weeks      PT LONG TERM GOAL #4   Title  The patient will negotiate 4 steps with one HR with supervision for safe entrance/exit from home.    Time  8    Period  Weeks      PT LONG TERM GOAL #5   Title  The patient will ambulate x 350 ft with RW mod indep  for improved household and short distance community negotiation to reduce caregiver assist.    Time  8    Period  Weeks      PT LONG TERM GOAL #6   Title  The patient will move floor<>stand with UE support and min A due to h/o falls.    Time  8    Period  Weeks      PT LONG TERM GOAL #7   Title  Pt will improve gait speed to 1.83 ft/sec w/ LRAD at S level in order to indicate decreased fall risk.     Time  8    Period  Weeks    Status  New            Plan - 01/20/18 1338    Clinical Impression Statement  Skilled session continues to problem solve with and educate wife on safety  at home with ambulation/mobility.  PT wonders if he should remain w/c level at home to avoid falls and unsafe behaviors.  Wife reports [facility] so that hopefully wife can get respite care.  Continue to work on gait for endurance/quality and seated nustep for overall strength and endurance.  Pt tolerated well.     PT Treatment/Interventions  ADLs/Self Care Home Management;Gait training;Stair training;Functional mobility training;Therapeutic activities;Therapeutic exercise;Neuromuscular re-education;Balance training;Patient/family education;Orthotic Fit/Training;Manual techniques    PT Next Visit Plan  *Instruct in towel roll for lumbar support to improve positioning in recliner and w/c. , seated postural strengthening, standing balance, gait training, L visual scanning. d/c planning (gym with wife's assist)    Consulted and Agree with Plan of Care  Patient       Patient will benefit from skilled therapeutic intervention in order to improve the following deficits and impairments:  Abnormal gait, Decreased endurance, Decreased activity tolerance, Decreased strength, Decreased safety awareness, Impaired flexibility, Postural dysfunction, Decreased balance, Decreased mobility  Visit Diagnosis: Other abnormalities of gait and mobility  Unsteadiness on feet  Muscle weakness  (generalized)  Abnormal posture     Problem List Patient Active Problem List   Diagnosis Date Noted  . 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 Foul smelling urine   . Leukocytosis   . Hyponatremia   . Acute right PCA stroke ([place]) 11/06/2017  . Benign essential HTN   . Coronary artery disease involving native coronary artery of native heart without angina pectoris   . Stage 3 chronic kidney disease ([place])   . Acute lower UTI   . Seasonal allergies   . Stroke ([place]) 11/04/2017  . HLD (hyperlipidemia) 11/04/2017  . Chronic diastolic CHF (congestive heart failure) ([place]) 11/04/2017  . Acute renal failure superimposed on stage 2 chronic kidney disease ([place]) 11/04/2017  . Acute metabolic encephalopathy 00/29/8473  . UTI (urinary tract infection) 11/04/2017  . AKI (acute kidney injury) ([place])   . History of stroke   . Prediabetes   . Left hemiparesis ([place]) 03/19/2013  . Cerebral infarction ([place]) 03/19/2013  . Left leg weakness 03/19/2013  . CAD, ARTERY BYPASS GRAFT 09/30/2008  . 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 Borderline hyperglycemia 12/03/2007  . Other and unspecified hyperlipidemia 07/28/2007  . Essential hypertension 07/28/2007  . 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 Coronary atherosclerosis 07/28/2007  . BENIGN PROSTATIC HYPERTROPHY 07/28/2007  . COLONIC POLYPS, 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    Cameron Sprang, PT, MPT [facility] [address] [place], [place], [address] Phone: [phone]   Fax:  [phone] 01/20/18, 1:42 PM  Name: Takeru Bose. MRN: [id number] Date of Birth: 24-Jan-1941

## 2018-01-21 ENCOUNTER — Encounter: Payer: Medicare Other | Attending: Registered Nurse | Admitting: Physical Medicine & Rehabilitation

## 2018-01-21 ENCOUNTER — Other Ambulatory Visit: Payer: Self-pay

## 2018-01-21 ENCOUNTER — Encounter: Payer: Self-pay | Admitting: Physical Medicine & Rehabilitation

## 2018-01-21 VITALS — BP 132/79 | HR 61 | Ht 71.0 in | Wt 173.0 lb

## 2018-01-21 DIAGNOSIS — F01518 Vascular dementia, unspecified severity, with other behavioral disturbance: Secondary | ICD-10-CM

## 2018-01-21 DIAGNOSIS — N182 Chronic kidney disease, stage 2 (mild): Secondary | ICD-10-CM | POA: Diagnosis not present

## 2018-01-21 DIAGNOSIS — R350 Frequency of micturition: Secondary | ICD-10-CM

## 2018-01-21 DIAGNOSIS — I130 Hypertensive heart and chronic kidney disease with heart failure and stage 1 through stage 4 chronic kidney disease, or unspecified chronic kidney disease: Secondary | ICD-10-CM | POA: Diagnosis not present

## 2018-01-21 DIAGNOSIS — F0151 Vascular dementia with behavioral disturbance: Secondary | ICD-10-CM | POA: Diagnosis not present

## 2018-01-21 DIAGNOSIS — I509 Heart failure, unspecified: Secondary | ICD-10-CM | POA: Insufficient documentation

## 2018-01-21 DIAGNOSIS — Z87891 Personal history of nicotine dependence: Secondary | ICD-10-CM | POA: Diagnosis not present

## 2018-01-21 DIAGNOSIS — E785 Hyperlipidemia, unspecified: Secondary | ICD-10-CM | POA: Diagnosis not present

## 2018-01-21 DIAGNOSIS — I63531 Cerebral infarction due to unspecified occlusion or stenosis of right posterior cerebral artery: Secondary | ICD-10-CM | POA: Diagnosis not present

## 2018-01-21 DIAGNOSIS — Z951 Presence of aortocoronary bypass graft: Secondary | ICD-10-CM | POA: Diagnosis not present

## 2018-01-21 DIAGNOSIS — N400 Enlarged prostate without lower urinary tract symptoms: Secondary | ICD-10-CM | POA: Diagnosis not present

## 2018-01-21 DIAGNOSIS — I2510 Atherosclerotic heart disease of native coronary artery without angina pectoris: Secondary | ICD-10-CM | POA: Insufficient documentation

## 2018-01-21 MED ORDER — DIVALPROEX SODIUM 500 MG PO DR TAB: 500.0000 mg | DELAYED_RELEASE_TABLET | Freq: Two times a day (BID) | ORAL | 2 refills | Status: DC

## 2018-01-21 MED ORDER — TAMSULOSIN HCL 0.4 MG PO CAPS: 0.4000 mg | ORAL_CAPSULE | Freq: Every day | ORAL | 4 refills | Status: AC

## 2018-01-21 NOTE — Progress Notes (Signed)
Subjective:    Patient ID: Thomas Alba., male    DOB: [birth year], 77 y.o.   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  I last saw him a month ago. He was having problems with his gait a couple weeks ago (his wife describes that his feet dragging as a result).  He continues in therapy at outpatient rehab otherwise in they are addressing balance and gait.  Wife states that when he walks with his walker in rehab he does very well but then when he comes home he tends to furniture walk a lot and lose stability.  He has had 2 episodes where he is lost his balance.  He was reaching down for a night light a couple weeks back and hit his head.  His irritability and confusion seems to be worse in the evenings.  His sleep has been a problem as well.  Issue appears to be urinary frequency at night causing him to wake up 3-4 times each evening when he urinates small amounts of urine.  He stopped the Flomax previously on his own.  We had weaned him off Urecholine at last visit.  Wife is working on getting him back into see urology.  She denies any urine odor, changes in color or fever.      Pain Inventory Average Pain 3 Pain Right Now 0 My pain is intermittent and dull  In the last 24 hours, has pain interfered with the following? General activity 0 Relation with others 0 Enjoyment of life 0 What TIME of day is your pain at its worst? no particular time Sleep (in general) Fair  Pain is worse with: walking and sitting Pain improves with: n/a Relief from Meds: 5  Mobility walk with assistance how many minutes can you walk? 5 ability to climb steps?  yes do you drive?  no use a wheelchair needs help with transfers  Function retired I need assistance with the following:  dressing, bathing, toileting, meal prep, household duties and shopping  Neuro/Psych trouble walking confusion  Prior Studies Any changes since last visit?  no  Physicians  involved in your care Any changes since last visit?  no   Family History  Problem Relation Age of Onset  . Coronary artery disease Mother   . Heart disease Mother   . Stroke Father   . Heart disease Father   . Heart disease Brother    Social History   Socioeconomic History  . Marital status: Married    Spouse name: Thomas [name]  . Number of children: 2  . Years of education: college  . Highest education level: Not on file  Occupational History  . Occupation: retired  Engineer, production  . Financial resource strain: Not on file  . Food insecurity:    Worry: Not on file    Inability: Not on file  . Transportation needs:    Medical: Not on file    Non-medical: Not on file  Tobacco Use  . Smoking status: Former Smoker    Packs/day: 0.75    Years: 50.00    Pack years: 37.50    Types: Cigarettes    Last attempt to quit: 03/19/2013    Years since quitting: 4.8  . Smokeless tobacco: Never Used  Substance and Sexual Activity  . Alcohol use: Yes    Alcohol/week: 0.6 oz    Types: 1 Cans of beer per week    Comment: 03/19/2013 "  1 beer/week" does not drink anymore  . Drug use: No  . Sexual activity: Not Currently  Lifestyle  . Physical activity:    Days per week: Not on file    Minutes per session: Not on file  . Stress: Not on file  Relationships  . Social connections:    Talks on phone: Not on file    Gets together: Not on file    Attends religious service: Not on file    Active member of club or organization: Not on file    Attends meetings of clubs or organizations: Not on file    Relationship status: Not on file  Other Topics Concern  . Not on file  Social History Narrative   Patient lives at home with wife Thomas [name])   Retired.   Education one year of college.   Right handed.   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    Past Surgical History:  Procedure Laterality Date  . CARDIAC CATHETERIZATION    . CATARACT EXTRACTION W/ INTRAOCULAR LENS  IMPLANT, BILATERAL Bilateral 1998  .  COLONOSCOPY  2008  . CORONARY ARTERY BYPASS GRAFT  1999   x4  . LOOP RECORDER INSERTION N/A 11/05/2017   Procedure: LOOP RECORDER INSERTION;  Surgeon: Hillis Range, MD;  Location: [facility]ASIVE [facility];  Service: Cardiovascular;  Laterality: N/A;  . TEE WITHOUT CARDIOVERSION N/A 11/05/2017   Procedure: TRANSESOPHAGEAL ECHOCARDIOGRAM (TEE) WITH LOOP;  Surgeon: Lars Masson, MD;  Location: [facility] ENDOSCOPY;  Service: Cardiovascular;  Laterality: N/A;   Past Medical History:  Diagnosis Date  . CHF (congestive heart failure) (HCC) 1999  . CKD (chronic kidney disease), stage II   . Coronary artery disease   . Hx of colonic polyps   . Hyperlipidemia   . Hyperplastic colon polyp 04/15/2007  . Hypertension   . Prostatic hypertrophy, benign    with elevated PSA  . Stroke (HCC)   . TIA (transient ischemic attack) 2003   "mini stroke" (03/19/2013)   BP 132/79   Pulse 61   Ht 5\' 11"  (1.803 m) Comment: pt reported  Wt 173 lb (78.5 kg) Comment: pt reported, in wheelchair  SpO2 96%   BMI 24.13 kg/m   Opioid Risk Score:   Fall Risk Score:  `1  Depression screen PHQ 2/9  Depression screen [facility] 2/9 01/21/2018 12/04/2017 11/15/2016 11/15/2016 07/13/2014 06/25/2013  Decreased Interest 0 1 0 0 0 0  Down, Depressed, Hopeless 0 0 0 0 0 0  PHQ - 2 Score 0 1 0 0 0 0  Altered sleeping - 0 - - - -  Tired, decreased energy - 1 - - - -  Change in appetite - 1 - - - -  Feeling bad or failure about yourself  - 1 - - - -  Trouble concentrating - 2 - - - -  Moving slowly or fidgety/restless - 1 - - - -  Suicidal thoughts - 1 - - - -  PHQ-9 Score - 8 - - - -    Review of Systems  Constitutional: Negative.   HENT: Negative.   Eyes: Negative.   Respiratory: Negative.   Cardiovascular: Negative.   Gastrointestinal: Negative.   Endocrine: Negative.   Genitourinary: Negative.   Musculoskeletal: Negative.   Skin: Negative.   Allergic/Immunologic: Negative.   Neurological: Negative.   Hematological:  Negative.   Psychiatric/Behavioral: Negative.   All other systems reviewed and are negative.      Objective:   Physical Exam  General: No  acute distress HEENT: EOMI, oral membranes moist Cards: reg rate  Chest: normal effort Abdomen: Soft, NT, ND Skin: dry, intact Extremities: no edema No limb edema.  Neurological: oriented to person, place, reason, year and month.    Mild left inattention.  L HH. Decreased insight and awareness LUE:5/5 proximal to distal, RUE almost 5/5 RLE:   5-/5 proximal to distal LLE: 5/5 proximal to distal --motor exam stable Coordination improving.  Senses pain and light touch in all 4 Skin: Warm Psychiatric: pleasant        Assessment & Plan:  1. 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  -continue with outpt therapies.   -discussed observation of precautions and strategies he's learned with therapy.  Spoke to patient about it directly today.  He did seem to have some insight into the importance of what we were speaking of.  However things do not change at home he may need long-term placement. 2.  CAD s/p CABG: On lipitor and ASA 3.  CKD stage III 4. UTI/BPH:3/25 UCx showed diptheroids (?contaminant)  -increased frequency, small amounts  -resume flomax  -consider ua and ucx  -urology follow up 5. Neuro-psych/dementia:             -increase depakote to 500mg  bid.              -continue seroquel for sleep  -consider trial of risperdal as well given potential sundowning symptoms.    Follow up with me in about 1 month. 15 minutes of face to face patient care time were spent during this visit. All questions were encouraged and answered.  Marland KitchenMarland Kitchen

## 2018-01-21 NOTE — Patient Instructions (Signed)
PLEASE FEEL FREE TO CALL OUR OFFICE WITH ANY PROBLEMS OR QUESTIONS ([phone])      

## 2018-01-22 ENCOUNTER — Telehealth: Payer: Self-pay | Admitting: Family Medicine

## 2018-01-22 NOTE — Telephone Encounter (Signed)
Spoke to Thomas [name].  She would like to know why "renal diet" was marked on the form.  He will be at the adult daycare one time a week.  He is currently eating a heart healthy/low salt diet.  Has appt with a nephrologist next month.  Please advise.

## 2018-01-22 NOTE — Telephone Encounter (Signed)
Copied from CRM [id number]. Topic: General - Other >> Jan 22, 2018  8:46 AM Trula Slade wrote: Reason for CRM:   Patient's spouse, [name] [phone], would like a return call from [facility] concerning the [facility] form he filled out for the patient.

## 2018-01-22 NOTE — Telephone Encounter (Signed)
Renal diet is low sodium/heart healthy

## 2018-01-23 ENCOUNTER — Ambulatory Visit: Payer: Medicare Other | Admitting: Speech Pathology

## 2018-01-23 ENCOUNTER — Encounter: Payer: Self-pay | Admitting: Family Medicine

## 2018-01-23 ENCOUNTER — Other Ambulatory Visit: Payer: Self-pay

## 2018-01-23 ENCOUNTER — Ambulatory Visit: Payer: Medicare Other | Admitting: Rehabilitative and Restorative Service Providers"

## 2018-01-23 ENCOUNTER — Encounter: Payer: Medicare Other | Admitting: Occupational Therapy

## 2018-01-23 ENCOUNTER — Encounter: Payer: Self-pay | Admitting: Rehabilitative and Restorative Service Providers"

## 2018-01-23 DIAGNOSIS — R2689 Other abnormalities of gait and mobility: Secondary | ICD-10-CM

## 2018-01-23 DIAGNOSIS — R2681 Unsteadiness on feet: Secondary | ICD-10-CM

## 2018-01-23 DIAGNOSIS — M6281 Muscle weakness (generalized): Secondary | ICD-10-CM

## 2018-01-23 DIAGNOSIS — R293 Abnormal posture: Secondary | ICD-10-CM

## 2018-01-23 DIAGNOSIS — R41841 Cognitive communication deficit: Secondary | ICD-10-CM

## 2018-01-23 MED ORDER — QUETIAPINE FUMARATE 25 MG PO TABS: 25.0000 mg | ORAL_TABLET | Freq: Every day | ORAL | 1 refills | Status: DC

## 2018-01-23 NOTE — Telephone Encounter (Signed)
Spoke to [place] and informed her both are the same.  She then notified me that the nurse at [facility] would have to monitor/measure pt's input and output for the renal diet.  She would like to know if it is okay to change to heart healthy?

## 2018-01-23 NOTE — Therapy (Signed)
[facility] [address] [place], [place], [address] Phone: [phone]   Fax:  [phone]  Speech Language Pathology Treatment  Patient Details  Name: Thomas [name]. MRN: [id number] Date of Birth: 10/21/1940 Referring Provider: Ranelle Oyster, MD   Encounter Date: 01/23/2018  End of Session - 01/23/18 1552    Visit Number  5    Number of Visits  17    Date for SLP Re-Evaluation  03/07/18    SLP Start Time  1020    SLP Stop Time   1109    SLP Time Calculation (min)  49 min    Activity Tolerance  Patient tolerated treatment well       Past Medical History:  Diagnosis Date  . CHF (congestive heart failure) (HCC) 1999  . CKD (chronic kidney disease), stage II   . Coronary artery disease   . Hx of colonic polyps   . Hyperlipidemia   . Hyperplastic colon polyp 04/15/2007  . Hypertension   . Prostatic hypertrophy, benign    with elevated PSA  . Stroke (HCC)   . TIA (transient ischemic attack) 2003   "mini stroke" (03/19/2013)    Past Surgical History:  Procedure Laterality Date  . CARDIAC CATHETERIZATION    . CATARACT EXTRACTION W/ INTRAOCULAR LENS  IMPLANT, BILATERAL Bilateral 1998  . COLONOSCOPY  2008  . CORONARY ARTERY BYPASS GRAFT  1999   x4  . LOOP RECORDER INSERTION N/A 11/05/2017   Procedure: LOOP RECORDER INSERTION;  Surgeon: Hillis Range, MD;  Location: [facility] INVASIVE CV LAB;  Service: Cardiovascular;  Laterality: N/A;  . TEE WITHOUT CARDIOVERSION N/A 11/05/2017   Procedure: TRANSESOPHAGEAL ECHOCARDIOGRAM (TEE) WITH LOOP;  Surgeon: Lars Masson, MD;  Location: [facility] ENDOSCOPY;  Service: Cardiovascular;  Laterality: N/A;    There were no vitals filed for this visit.  Subjective Assessment - 01/23/18 1544    Subjective  Pt returns with homework completed    Patient is accompained by:  Family member    Currently in Pain?  No/denies            ADULT SLP TREATMENT - 01/23/18 1020       General Information   Behavior/Cognition  Alert;Cooperative;Pleasant mood    Patient Positioning  Upright in chair      Treatment Provided   Treatment provided  Cognitive-Linquistic      Pain Assessment   Pain Assessment  No/denies pain      Cognitive-Linquistic Treatment   Treatment focused on  Cognition    Skilled Treatment  Pt's wife with questions about what to do when pt "gets confused." With further questioning, she clarified that pt is forgetting things he has already done and recently expressed frustration when he was confused, feeling he was supposed to go to work. She reports using calendar with pt; SLP sugested using a simple daily schedule to orient pt and to help with recall of daily activities. Pt's wife has a board at home she plans to begin using. SLP facilitated pt's sustained attention to simple word searches, occasional min A for left attention. In subsequent linguistic tasks, pt required usual mod A for sustained, selective attention. Pt with decreased frustration tolerance, shouting briefly. SLP reduced stimulation and task complexity, provided education re: taking breaks when feeling frustrated.      Assessment / Recommendations / Plan   Plan  Continue with current plan of care      Progression Toward Goals   Progression toward  goals  Progressing toward goals       SLP Education - 01/23/18 1552    Education provided  Yes    Education Details  schedule management, taking breaks    Person(s) Educated  Patient;Spouse    Methods  Explanation;Demonstration;Verbal cues    Comprehension  Verbalized understanding;Need further instruction       SLP Short Term Goals - 01/23/18 1032      SLP SHORT TERM GOAL #1   Title  Pt will attend to simple cognitive linguistic tasks in mildly distracting environment for 10 minutes over 2 sessions     Status  Achieved      SLP SHORT TERM GOAL #2   Title  Pt will ID and correct 3/5 errors on simple cognitive linguistic tasks with  occasional min A over 2 sessions.     Time  2    Period  Weeks    Status  On-going      SLP SHORT TERM GOAL #3   Title  Pt will complete mildy complex organization, reasoning, problem solving tasks with 85% accuracy and occasional min A    Time  2    Period  Weeks    Status  On-going      SLP SHORT TERM GOAL #4   Title  Pt will utilize external aids for functional recall and schedule management with rare min A over 2 sessions    Time  2    Period  Weeks    Status  On-going       SLP Long Term Goals - 01/23/18 1553      SLP LONG TERM GOAL #1   Title  Pt will alternate attention between 2 simple cognitive linguistic tasks with 80% accuracy on each and occasional min A     Time  6    Period  Weeks    Status  On-going      SLP LONG TERM GOAL #2   Title  Pt will self correct errors 4/5 errors with occasional min A     Time  6    Period  Weeks    Status  On-going      SLP LONG TERM GOAL #3   Title   Pt will report daily functional and effective use of compensatory strategies for recall of daily activities and appointments    Time  6    Period  Weeks    Status  On-going      SLP LONG TERM GOAL #4   Title  Spouse will demonstrate 2 strategies to improve pt's attending to and follow through of her directions/safety precautions with rare min A over 3 sessions    Time  6    Period  Weeks    Status  On-going       Plan - 01/23/18 1553    Clinical Impression Statement  Pt continues with significant cognitive deficits in attention, executive function, awareness affecting his safety judgement and awareness. Continue skilled ST to maximize safety and reduce caregiver burden.     Speech Therapy Frequency  2x / week    Treatment/Interventions  Cognitive reorganization;Compensatory strategies;Patient/family education;SLP instruction and feedback;Functional tasks;Internal/external aids;Cueing hierarchy    Potential to Achieve Goals  Fair    Potential Considerations  Severity of  impairments;Ability to learn/carryover information;Previous level of function    Consulted and Agree with Plan of Care  Patient;Family member/caregiver       Patient will benefit from skilled therapeutic intervention in order to  improve the following deficits and impairments:   Cognitive communication deficit    Problem List Patient Active Problem List   Diagnosis Date Noted  . 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 Foul smelling urine   . Leukocytosis   . Hyponatremia   . 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 Benign essential HTN   . Coronary artery disease involving native coronary artery of native heart without angina pectoris   . Stage 3 chronic kidney disease (HCC)   . Acute lower UTI   . Seasonal allergies   . Stroke (HCC) 11/04/2017  . HLD (hyperlipidemia) 11/04/2017  . Chronic diastolic CHF (congestive heart failure) (HCC) 11/04/2017  . Acute renal failure superimposed on stage 2 chronic kidney disease (HCC) 11/04/2017  . Acute metabolic encephalopathy 11/04/2017  . UTI (urinary tract infection) 11/04/2017  . AKI (acute kidney injury) (HCC)   . History of stroke   . Prediabetes   . Left hemiparesis (HCC) 03/19/2013  . Cerebral infarction (HCC) 03/19/2013  . Left leg weakness 03/19/2013  . CAD, ARTERY BYPASS GRAFT 09/30/2008  . 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 Borderline hyperglycemia 12/03/2007  . Other and unspecified hyperlipidemia 07/28/2007  . Essential hypertension 07/28/2007  . 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 Coronary atherosclerosis 07/28/2007  . BENIGN PROSTATIC HYPERTROPHY 07/28/2007  . COLONIC POLYPS, 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   Rondel Baton, MS, CCC-SLP Speech-Language Pathologist  Arlana Lindau 01/23/2018, 3:54 PM  [facility] [address] [place], [place], [address] Phone: [phone]   Fax:  [phone]   Name: Thomas Dreis. MRN:  [id number] Date of Birth: 1940/12/05

## 2018-01-23 NOTE — Telephone Encounter (Signed)
Pt's wife request new script for QUEtiapine (SEROQUEL) 25 MG tablet sent to [facility] [address]

## 2018-01-23 NOTE — Telephone Encounter (Signed)
Refill send to [facility] on [address].

## 2018-01-23 NOTE — Telephone Encounter (Signed)
Heart healthy is fine

## 2018-01-23 NOTE — Therapy (Signed)
[facility] [address] [place], [place], [address] Phone: [phone]   Fax:  [phone]  Physical Therapy Treatment  Patient Details  Name: Thomas [name]. MRN: [id number] Date of Birth: 05-23-1941 Referring Provider: Alger Simons, MD   Encounter Date: 01/23/2018  PT End of Session - 01/23/18 1658    Visit Number  14    Number of Visits  17    Date for PT Re-Evaluation  01/27/18    Authorization Type  [employer] medicare     PT Start Time  0935    PT Stop Time  1015    PT Time Calculation (min)  40 min    Equipment Utilized During Treatment  Gait belt    Activity Tolerance  Patient tolerated treatment well    Behavior During Therapy  Agitated       Past Medical History:  Diagnosis Date  . CHF (congestive heart failure) ([facility]) 1999  . CKD (chronic kidney disease), stage II   . Coronary artery disease   . Hx of colonic polyps   . Hyperlipidemia   . Hyperplastic colon polyp 04/15/2007  . Hypertension   . Prostatic hypertrophy, benign    with elevated PSA  . Stroke ([place])   . TIA (transient ischemic attack) 2003   "mini stroke" (03/19/2013)    Past Surgical History:  Procedure Laterality Date  . CARDIAC CATHETERIZATION    . CATARACT EXTRACTION W/ INTRAOCULAR LENS  IMPLANT, BILATERAL Bilateral 1998  . COLONOSCOPY  2008  . CORONARY ARTERY BYPASS GRAFT  1999   x4  . LOOP RECORDER INSERTION N/A 11/05/2017   Procedure: LOOP RECORDER INSERTION;  Surgeon: Thompson Grayer, MD;  Location: [facility];  Service: Cardiovascular;  Laterality: N/A;  . TEE WITHOUT CARDIOVERSION N/A 11/05/2017   Procedure: TRANSESOPHAGEAL ECHOCARDIOGRAM (TEE) WITH LOOP;  Surgeon: Dorothy Spark, MD;  Location: [facility] ENDOSCOPY;  Service: Cardiovascular;  Laterality: N/A;    There were no vitals filed for this visit.  Subjective Assessment - 01/23/18 0935    Subjective  The patient's wife reports they saw Dr. Naaman Plummer this week with  some medications adjusted and also [facility]      Pertinent History  CKD, prior CVA using RW prior to recent admission, HTN, CHF.    Patient Stated Goals  Moving "a whole lot easier than I am now."  Wife notes she wants him to get stronger and get back to where he can have a little more independence.    Currently in Pain?  No/denies         [facility] PT Assessment - 01/23/18 0941      Standardized Balance Assessment   Standardized Balance Assessment  Berg Balance Test      Berg Balance Test   Sit to Stand  Able to stand  independently using hands    Standing Unsupported  Able to stand 30 seconds unsupported    Sitting with Back Unsupported but Feet Supported on Floor or Stool  Able to sit safely and securely 2 minutes    Stand to Sit  Controls descent by using hands    Transfers  Able to transfer with verbal cueing and /or supervision    Standing Unsupported with Eyes Closed  Needs help to keep from falling    Standing Ubsupported with Feet Together  Needs help to attain position and unable to hold for 15 seconds    From Standing, Reach Forward with  Outstretched Arm  Loses balance while trying/requires external support    From Standing Position, Pick up Object from Floor  Unable to try/needs assist to keep balance    From Standing Position, Turn to Look Behind Over each Shoulder  Needs assist to keep from losing balance and falling    Turn 360 Degrees  Needs assistance while turning    Standing Unsupported, Alternately Place Feet on Step/Stool  Needs assistance to keep from falling or unable to try    Standing Unsupported, One Foot in [place] balance while stepping or standing    Standing on One Leg  Unable to try or needs assist to prevent fall    Total Score  14    Berg comment:  14/56 (*patient initially scored 7/56, then increased to 19/56,and now has declined to 14/56.  *This supports his functional mobility as well.  He sustained 2 falls and has  had a decrease in mobility since that time (MD has reassessed).                   [facility] PT Treatment/Exercise - 01/23/18 0941      Transfers   Transfers  Sit to Stand    Sit to Stand  5: Supervision      Ambulation/Gait   Ambulation/Gait  Yes    Ambulation/Gait Assistance  4: Min guard    Ambulation/Gait Assistance Details  Patient needs cues for upright posture, longer step length.  Also discussed not sitting mid way around track and how this could impact safety at home (patient had to walk 20 ft beyond where he was ready to sit).     Ambulation Distance (Feet)  170 Feet then 70 feet    Assistive device  Rolling walker    Gait Pattern  Step-through pattern;Decreased step length - right;Decreased step length - left;Left genu recurvatum;Decreased trunk rotation;Decreased weight shift to left    Ambulation Surface  Level    Gait velocity  0.90 ft/sec      Self-Care   Self-Care  Other Self-Care Comments    Other Self-Care Comments   pillow / towel roll demo for lumbar positioning in w/c.      Therapeutic Activites    Therapeutic Activities  Other Therapeutic Activities    Other Therapeutic Activities  Transfers floor<>stand with min to mod A.  Patient began screaming in gym "my damn knees".  He would not come all the way down to the floor to rest before getting up and moved impulsively with PT providing physical assistance as able.  Patient's wife reports this was much better than it usually goes at home.      Exercises   Exercises  Other Exercises    Other Exercises   Sit<>stand with UE support x 5 reps.                PT Short Term Goals - 12/23/17 1019      PT SHORT TERM GOAL #1   Title  The patient will be indep with HEP for LE strengthening, posture, stretching and general mobility.    Baseline  met 12/23/17    Time  4    Period  Weeks    Status  Achieved      PT SHORT TERM GOAL #2   Title  The patient will improve Berg balance score from 7/56 to  > or equal to 16/56 to demonstrate improving independence for standing balance and transfers.    Baseline  Improved to 19/56.    Time  4    Period  Weeks    Status  Achieved      PT SHORT TERM GOAL #3   Title  The patient will move sit<>stand with UE support 5/5 trials mod indep to RW.    Baseline  moderate to max progressed to mod I to RW on 12/23/17    Time  4    Period  Weeks    Status  Achieved      PT SHORT TERM GOAL #4   Title  The patient will be further assessed on gait speed and LTG to follow.    Baseline  1.23 ft/sec with RW at [facility]     Time  4    Period  Weeks    Status  Achieved      PT SHORT TERM GOAL #5   Title  The patient will ambulate x 200 ft nonstop with RW and CGA to demonstrate improving endurance and dec'd caregiver assist.    Baseline  pt able to ambulate 300' nonstop at [facility] on 12/23/17    Time  4    Period  Weeks    Status  Achieved        PT Long Term Goals - 01/23/18 1007      PT LONG TERM GOAL #1   Title  The patient will improve functional status score from 21% to > or equal to 35% to demo improved perception of functional abiltiies.    Baseline  Patient is showing a decline in mobility and PT focusing on caregiver education and home safety.    Time  8    Period  Weeks    Status  Deferred      PT LONG TERM GOAL #2   Title  The patient will improve gait speed *baseline to be established after eval.    Time  8    Period  Weeks    Status  Achieved      PT LONG TERM GOAL #3   Title  The patient will improve Berg score from 7/56 to > or equal to 22/56 to demo improving standing balance for ADLs/functional tasks.    Baseline  01/23/18:  14/56 (*patient initially scored 7/56, then increased to 19/56,and now has declined to 14/56.  *This supports his functional mobility as well.  He sustained 2 falls and has had a decrease in mobility since that time (MD has reassessed).    Time  8    Period  Weeks    Status  Partially Met      PT LONG TERM GOAL #4    Title  The patient will negotiate 4 steps with one HR with supervision for safe entrance/exit from home.    Time  8    Period  Weeks    Status  On-going      PT LONG TERM GOAL #5   Title  The patient will ambulate x 350 ft with RW mod indep for improved household and short distance community negotiation to reduce caregiver assist.    Time  8    Period  Weeks    Status  Not Met      PT LONG TERM GOAL #6   Title  The patient will move floor<>stand with UE support and min A due to h/o falls.    Baseline  Patient requires min to mod A and is impulsive and agitated during floor transfer.    Time  8    Period  Weeks    Status  Partially Met      PT LONG TERM GOAL #7   Title  Pt will improve gait speed to 1.83 ft/sec w/ LRAD at S level in order to indicate decreased fall risk.     Baseline  01/23/18:  0.90 ft/sec with RW and CGA for safety.  *patient demonstrating decline in mobility.    Time  8    Period  Weeks    Status  Not Met            Plan - 01/23/18 1705    Clinical Impression Statement  The patient has partially met some LTGs.  PT to continue checking goals at next visit and renew at 1x/week frequency with goals focused on transitioning to community to maintain strength (will use stepper at gym per wife) and home safety.  Will also need further work on floor transfers as the patient falls at home and requires assist from wife to get up (some of this is behavioral/agitation).     PT Treatment/Interventions  ADLs/Self Care Home Management;Gait training;Stair training;Functional mobility training;Therapeutic activities;Therapeutic exercise;Neuromuscular re-education;Balance training;Patient/family education;Orthotic Fit/Training;Manual techniques    PT Next Visit Plan  floor transfers, finish checking LTGS (stairs), home safety, review HEP and d/c with gym routine using stepper.    Consulted and Agree with Plan of Care  Patient       Patient will benefit from skilled  therapeutic intervention in order to improve the following deficits and impairments:  Abnormal gait, Decreased endurance, Decreased activity tolerance, Decreased strength, Decreased safety awareness, Impaired flexibility, Postural dysfunction, Decreased balance, Decreased mobility  Visit Diagnosis: Other abnormalities of gait and mobility  Unsteadiness on feet  Muscle weakness (generalized)  Abnormal posture     Problem List Patient Active Problem List   Diagnosis Date Noted  . 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 Foul smelling urine   . Leukocytosis   . Hyponatremia   . Acute right PCA stroke ([place]) 11/06/2017  . Benign essential HTN   . Coronary artery disease involving native coronary artery of native heart without angina pectoris   . Stage 3 chronic kidney disease ([place])   . Acute lower UTI   . Seasonal allergies   . Stroke ([place]) 11/04/2017  . HLD (hyperlipidemia) 11/04/2017  . Chronic diastolic CHF (congestive heart failure) ([place]) 11/04/2017  . Acute renal failure superimposed on stage 2 chronic kidney disease ([place]) 11/04/2017  . Acute metabolic encephalopathy 29/51/8841  . UTI (urinary tract infection) 11/04/2017  . AKI (acute kidney injury) ([place])   . History of stroke   . Prediabetes   . Left hemiparesis ([place]) 03/19/2013  . Cerebral infarction ([place]) 03/19/2013  . Left leg weakness 03/19/2013  . CAD, ARTERY BYPASS GRAFT 09/30/2008  . 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 Borderline hyperglycemia 12/03/2007  . Other and unspecified hyperlipidemia 07/28/2007  . Essential hypertension 07/28/2007  . 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 Coronary atherosclerosis 07/28/2007  . BENIGN PROSTATIC HYPERTROPHY 07/28/2007  . COLONIC POLYPS, 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    Macon Sandiford, PT 01/23/2018, 5:07 PM  [facility] [address] [place], [place], [address] Phone: [phone]   Fax:   [phone]  Name: Kiing Deakin. MRN: [id number] Date of Birth: 11/15/1940

## 2018-01-23 NOTE — Telephone Encounter (Signed)
Olegario Messier notified to pick up letter at the front desk.  No further action required.

## 2018-01-24 ENCOUNTER — Ambulatory Visit: Payer: Medicare Other | Admitting: Adult Health

## 2018-01-24 ENCOUNTER — Encounter: Payer: Self-pay | Admitting: Adult Health

## 2018-01-24 VITALS — BP 146/80 | HR 66 | Temp 97.5°F

## 2018-01-24 DIAGNOSIS — R339 Retention of urine, unspecified: Secondary | ICD-10-CM | POA: Diagnosis not present

## 2018-01-24 NOTE — Progress Notes (Signed)
Subjective:    Patient ID: Thomas [name]., male    DOB: [birth year], 77 y.o.   MRN: [id number]  HPI  77 year old male who  has a past medical history of CHF (congestive heart failure) ([facility]) (1999), CKD (chronic kidney disease), stage II, Coronary artery disease, colonic polyps, Hyperlipidemia, Hyperplastic colon polyp (04/15/2007), Hypertension, Prostatic hypertrophy, benign, Stroke ([facility]), and TIA (transient ischemic attack) (2003).  He presents with his wife today with the acute complaint of unable to urinate. He reports that he was able to void last night and this morning but difficult for him to start a stream and he voided very little.  That time he is been able to urinate, despite having that sensation to have to.  Wife who is with him at this visit and the main caregiver reports that at one point in time this that happened in the past and they ended up having to go to the emergency room and were admitted for 2 days.  He does report feeling discomfort in his lower abdomen.  He denies noticing any blood in his urine, and not have any pain with urination yesterday evening or this morning.  This patient also has an issue with chronic constipation and unfortunately has not had a bowel movement since Monday.  Review of Systems See HPI   Past Medical History:  Diagnosis Date  . CHF (congestive heart failure) ([facility]) 1999  . CKD (chronic kidney disease), stage II   . Coronary artery disease   . Hx of colonic polyps   . Hyperlipidemia   . Hyperplastic colon polyp 04/15/2007  . Hypertension   . Prostatic hypertrophy, benign    with elevated PSA  . Stroke ([facility])   . TIA (transient ischemic attack) 2003   "mini stroke" (03/19/2013)    Social History   Socioeconomic History  . Marital status: Married    Spouse name: cathy  . Number of children: 2  . Years of education: college  . Highest education level: Not on file  Occupational History  . Occupation: retired  Engineer, production  .  Financial resource strain: Not on file  . Food insecurity:    Worry: Not on file    Inability: Not on file  . Transportation needs:    Medical: Not on file    Non-medical: Not on file  Tobacco Use  . Smoking status: Former Smoker    Packs/day: 0.75    Years: 50.00    Pack years: 37.50    Types: Cigarettes    Last attempt to quit: 03/19/2013    Years since quitting: 4.8  . Smokeless tobacco: Never Used  Substance and Sexual Activity  . Alcohol use: Yes    Alcohol/week: 0.6 oz    Types: 1 Cans of beer per week    Comment: 03/19/2013 "1 beer/week" does not drink anymore  . Drug use: No  . Sexual activity: Not Currently  Lifestyle  . Physical activity:    Days per week: Not on file    Minutes per session: Not on file  . Stress: Not on file  Relationships  . Social connections:    Talks on phone: Not on file    Gets together: Not on file    Attends religious service: Not on file    Active member of club or organization: Not on file    Attends meetings of clubs or organizations: Not on file    Relationship status: Not on file  . Intimate  partner violence:    Fear of current or ex partner: Not on file    Emotionally abused: Not on file    Physically abused: Not on file    Forced sexual activity: Not on file  Other Topics Concern  . Not on file  Social History Narrative   Patient lives at home with wife [name])   Retired.   Education one year of college.   Right handed.   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     Past Surgical History:  Procedure Laterality Date  . CARDIAC CATHETERIZATION    . CATARACT EXTRACTION W/ INTRAOCULAR LENS  IMPLANT, BILATERAL Bilateral 1998  . COLONOSCOPY  2008  . CORONARY ARTERY BYPASS GRAFT  1999   x4  . LOOP RECORDER INSERTION N/A 11/05/2017   Procedure: LOOP RECORDER INSERTION;  Surgeon: Hillis Range, MD;  Location: [facility];  Service: Cardiovascular;  Laterality: N/A;  . TEE WITHOUT CARDIOVERSION N/A 11/05/2017   Procedure:  TRANSESOPHAGEAL ECHOCARDIOGRAM (TEE) WITH LOOP;  Surgeon: Lars Masson, MD;  Location: [facility] ENDOSCOPY;  Service: Cardiovascular;  Laterality: N/A;    Family History  Problem Relation Age of Onset  . Coronary artery disease Mother   . Heart disease Mother   . Stroke Father   . Heart disease Father   . Heart disease Brother     Allergies  Allergen Reactions  . Sulfonamide Derivatives     REACTION: Weak,lethargic    Current Outpatient Medications on File Prior to Visit  Medication Sig Dispense Refill  . acetaminophen (TYLENOL) 325 MG tablet Take 1-2 tablets (325-650 mg total) by mouth every 4 (four) hours as needed for mild pain.    Marland Kitchen aspirin EC 325 MG EC tablet Take 1 tablet (325 mg total) by mouth daily. 30 tablet 0  . atorvastatin (LIPITOR) 80 MG tablet Take 1 tablet (80 mg total) by mouth daily. (Patient taking differently: Take 80 mg by mouth daily at 6 PM. )    . clopidogrel (PLAVIX) 75 MG tablet TAKE 1 TABLET BY MOUTH  DAILY WITH BREAKFAST (Patient taking differently: TAKE 1 TABLET (75mg ) BY MOUTH  DAILY WITH BREAKFAST) 90 tablet 3  . divalproex (DEPAKOTE) 500 MG DR tablet Take 1 tablet (500 mg total) by mouth 2 (two) times daily. 60 tablet 2  . Flax OIL Take 1 capsule by mouth daily.      . fluticasone (FLONASE) 50 MCG/ACT nasal spray Place 2 sprays into both nostrils daily as needed for allergies or rhinitis.  2  . folic acid (FOLVITE) 1 MG tablet TAKE 1 TABLET EVERY DAY (Patient taking differently: TAKE 1 TABLET (1mg ) by mouth EVERY DAY) 90 tablet 3  . hydrocerin (EUCERIN) CREA Apply 1 application topically 2 (two) times daily.  0  . ketoconazole (NIZORAL) 2 % cream Apply 1 application topically 2 (two) times daily. 60 g 1  . loratadine (CLARITIN) 10 MG tablet Take 10 mg by mouth at bedtime.     . metoprolol tartrate (LOPRESSOR) 50 MG tablet TAKE 1 TABLET BY MOUTH TWO  TIMES DAILY (Patient taking differently: TAKE 1 TABLET (50mg ) BY MOUTH TWO  TIMES DAILY) 180 tablet 3  .  Omega-3 Fatty Acids (FISH OIL) 1000 MG CAPS Take 1 capsule by mouth daily.      . QUEtiapine (SEROQUEL) 25 MG tablet Take 1 tablet (25 mg total) by mouth at bedtime. 90 tablet 1  . tamsulosin (FLOMAX) 0.4 MG CAPS capsule Take 1 capsule (0.4 mg total) by mouth daily  after supper. 30 capsule 4   No current facility-administered medications on file prior to visit.     BP (!) 146/80   Pulse 66   Temp (!) 97.5 F (36.4 C) (Oral)   SpO2 96%       Objective:   Physical Exam  Constitutional: He is oriented to person, place, and time. He appears well-developed and well-nourished. No distress.  Cardiovascular: Normal rate, regular rhythm, normal heart sounds and intact distal pulses. Exam reveals no gallop and no friction rub.  No murmur heard. Pulmonary/Chest: Effort normal and breath sounds normal. No stridor. No respiratory distress. He has no wheezes. He has no rales. He exhibits no tenderness.  Abdominal: Soft. Bowel sounds are normal. He exhibits no distension. There is tenderness in the suprapubic area.  Musculoskeletal:  Patient sitting in wheelchair throughout exam  Neurological: He is alert and oriented to person, place, and time.  Skin: Skin is warm and dry. He is not diaphoretic.  Nursing note and vitals reviewed.     Assessment & Plan:  1. Urinary retention -Set of factors include constipation, BPH, urethral stricture, etc.  Discussed with family and patient possible etiologies.  I advised them to try a bottle of  magnesium citrate.  If he is unable to urinate after having a bowel movement then I asked that they go to the emergency room for further evaluation.  They are okay with this plan.  Shirline Frees, NP

## 2018-01-24 NOTE — Progress Notes (Signed)
Chief Complaint  Patient presents with  . Urinary Retention    HPI: Thomas [name]. 77 y.o. Patient  [facility]   Here with wife  Seen  Yesterday  And assesment   Hx of utis  S/p cva  Residual cognition and pareis but  Stable   Sx    Hx of  utis  And infection    Constipation better after mag citrate  Urinary frequency small amounts no fever back pain appetite changes  Has appt uro in July  Hx rena;l insufficiency  But ablt to take med  cipro   rx  Iv antibiotics  And cipro in past    Assessment & Plan:  1. Urinary retention -Set of factors include constipation, BPH, urethral stricture, etc.  Discussed with family and patient possible etiologies.  I advised them to try a bottle of  magnesium citrate.  If he is unable to urinate after having a bowel movement then I asked that they go to the emergency room for further evaluation.  They are okay with this plan.  Shirline Frees, NP    ROS: See pertinent positives and negatives per HPI.  Past Medical History:  Diagnosis Date  . CHF (congestive heart failure) (HCC) 1999  . CKD (chronic kidney disease), stage II   . Coronary artery disease   . Hx of colonic polyps   . Hyperlipidemia   . Hyperplastic colon polyp 04/15/2007  . Hypertension   . Prostatic hypertrophy, benign    with elevated PSA  . Stroke (HCC)   . TIA (transient ischemic attack) 2003   "mini stroke" (03/19/2013)    Family History  Problem Relation Age of Onset  . Coronary artery disease Mother   . Heart disease Mother   . Stroke Father   . Heart disease Father   . Heart disease Brother     Social History   Socioeconomic History  . Marital status: Married    Spouse name: cathy  . Number of children: 2  . Years of education: college  . Highest education level: Not on file  Occupational History  . Occupation: retired  Engineer, production  . Financial resource strain: Not on file  . Food insecurity:    Worry: Not on file    Inability:  Not on file  . Transportation needs:    Medical: Not on file    Non-medical: Not on file  Tobacco Use  . Smoking status: Former Smoker    Packs/day: 0.75    Years: 50.00    Pack years: 37.50    Types: Cigarettes    Last attempt to quit: 03/19/2013    Years since quitting: 4.8  . Smokeless tobacco: Never Used  Substance and Sexual Activity  . Alcohol use: Yes    Alcohol/week: 0.6 oz    Types: 1 Cans of beer per week    Comment: 03/19/2013 "1 beer/week" does not drink anymore  . Drug use: No  . Sexual activity: Not Currently  Lifestyle  . Physical activity:    Days per week: Not on file    Minutes per session: Not on file  . Stress: Not on file  Relationships  . Social connections:    Talks on phone: Not on file    Gets together: Not on file    Attends religious service: Not on file    Active member of club or organization: Not on file    Attends meetings of clubs or organizations:  Not on file    Relationship status: Not on file  Other Topics Concern  . Not on file  Social History Narrative   Patient lives at home with wife [name])   Retired.   Education one year of college.   Right handed.   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     Outpatient Medications Prior to Visit  Medication Sig Dispense Refill  . acetaminophen (TYLENOL) 325 MG tablet Take 1-2 tablets (325-650 mg total) by mouth every 4 (four) hours as needed for mild pain.    Marland Kitchen aspirin EC 325 MG EC tablet Take 1 tablet (325 mg total) by mouth daily. 30 tablet 0  . atorvastatin (LIPITOR) 80 MG tablet Take 1 tablet (80 mg total) by mouth daily. (Patient taking differently: Take 80 mg by mouth daily at 6 PM. )    . clopidogrel (PLAVIX) 75 MG tablet TAKE 1 TABLET BY MOUTH  DAILY WITH BREAKFAST (Patient taking differently: TAKE 1 TABLET (75mg ) BY MOUTH  DAILY WITH BREAKFAST) 90 tablet 3  . divalproex (DEPAKOTE) 500 MG DR tablet Take 1 tablet (500 mg total) by mouth 2 (two) times daily. 60 tablet 2  . Flax OIL Take 1  capsule by mouth daily.      . fluticasone (FLONASE) 50 MCG/ACT nasal spray Place 2 sprays into both nostrils daily as needed for allergies or rhinitis.  2  . folic acid (FOLVITE) 1 MG tablet TAKE 1 TABLET EVERY DAY (Patient taking differently: TAKE 1 TABLET (1mg ) by mouth EVERY DAY) 90 tablet 3  . hydrocerin (EUCERIN) CREA Apply 1 application topically 2 (two) times daily.  0  . ketoconazole (NIZORAL) 2 % cream Apply 1 application topically 2 (two) times daily. 60 g 1  . loratadine (CLARITIN) 10 MG tablet Take 10 mg by mouth at bedtime.     . metoprolol tartrate (LOPRESSOR) 50 MG tablet TAKE 1 TABLET BY MOUTH TWO  TIMES DAILY (Patient taking differently: TAKE 1 TABLET (50mg ) BY MOUTH TWO  TIMES DAILY) 180 tablet 3  . Omega-3 Fatty Acids (FISH OIL) 1000 MG CAPS Take 1 capsule by mouth daily.      . QUEtiapine (SEROQUEL) 25 MG tablet Take 1 tablet (25 mg total) by mouth at bedtime. 90 tablet 1  . tamsulosin (FLOMAX) 0.4 MG CAPS capsule Take 1 capsule (0.4 mg total) by mouth daily after supper. 30 capsule 4   No facility-administered medications prior to visit.      EXAM:  BP (!) 142/76 (BP Location: Left Arm, Patient Position: Sitting, Cuff Size: Normal)   Pulse 61   Temp 97.6 F (36.4 C) (Oral)   SpO2 99%   There is no height or weight on file to calculate BMI.  GENERAL: vitals reviewed and listed above, alert, appears well hydrated and in no acute distress in wc verbal interactive  Wife give hx  HEENT: atraumatic, conjunctiva  Left mild rednessotherwise  clear, no obvious abnormalities on inspection of external nose and ears  NECK: no obvious masses on inspection palpation  LUNGS: clear to auscultation bilaterally, no wheezes, rales or rhonchi,  CV: HRRR, no clubbing cyanosisnl cap refill   No cva pain  MS: moves all extremities  PSYCH: pleasant and cooperative, Lab Results  Component Value Date   WBC 8.9 12/12/2017   HGB 12.4 (L) 12/12/2017   HCT 36.0 (L) 12/12/2017   PLT 374.0  12/12/2017   GLUCOSE 126 (H) 12/17/2017   CHOL 138 11/04/2017   TRIG 80 11/04/2017  HDL 42 11/04/2017   LDLCALC 80 11/04/2017   ALT 16 (L) 12/02/2017   AST 31 12/02/2017   NA 136 12/17/2017   K 4.9 12/17/2017   CL 102 12/17/2017   CREATININE 1.27 12/17/2017   BUN 15 12/17/2017   CO2 26 12/17/2017   TSH 2.29 11/15/2016   PSA 2.75 11/15/2016   INR 1.13 11/03/2017   HGBA1C 5.8 (H) 11/04/2017   BP Readings from Last 3 Encounters:  01/25/18 (!) 142/76  01/24/18 (!) 146/80  01/21/18 132/79  ua pos nitrites  3+ leuk and  Pos blood  Send for culture   ASSESSMENT AND PLAN:  Discussed the following assessment and plan:  Urinary tract infection without hematuria, site unspecified - hx of   citrobactor and pseudomonas in past also. no systemic sx today but high risk has urology appt need fu after rx to adjust rx as appropriate  Urinary retention - Plan: POCT Urinalysis Dipstick (Automated)  Abnormal urinalysis - Plan: Urine Culture  Risk benefit of medication discussed.   cipro  gfr was 58 last  Cr check  cipro 500 bid for 7 days. Total visit > 50% spent counseling and coordinating care as indicated in above note and in instructions to patient . Close follow up advised   Culture  -Patient advised to return or notify health care team  if  new concerns arise.  Patient Instructions  Treat for infection today   As discussed .   Keep appt with urology .  Begin cipro  50o mg  Twice a day for 7 days . And then  Plan follow up .     Should have fu urinalysis eval  after treatment  .       Neta Mends. Panosh M.D.

## 2018-01-25 ENCOUNTER — Encounter: Payer: Self-pay | Admitting: Internal Medicine

## 2018-01-25 ENCOUNTER — Ambulatory Visit: Payer: Medicare Other | Admitting: Internal Medicine

## 2018-01-25 ENCOUNTER — Other Ambulatory Visit (HOSPITAL_COMMUNITY)
Admit: 2018-01-25 | Discharge: 2018-01-25 | Disposition: A | Discharge status: Home / Self Care | Payer: Medicare Other | Source: Other Acute Inpatient Hospital | Attending: Internal Medicine | Admitting: Internal Medicine

## 2018-01-25 VITALS — BP 142/76 | HR 61 | Temp 97.6°F

## 2018-01-25 DIAGNOSIS — R8290 Unspecified abnormal findings in urine: Secondary | ICD-10-CM | POA: Diagnosis not present

## 2018-01-25 DIAGNOSIS — R339 Retention of urine, unspecified: Secondary | ICD-10-CM | POA: Diagnosis not present

## 2018-01-25 DIAGNOSIS — N390 Urinary tract infection, site not specified: Secondary | ICD-10-CM

## 2018-01-25 LAB — POC URINALSYSI DIPSTICK (AUTOMATED)
GLUCOSE UA: NEGATIVE
PH UA: 6 (ref 5.0–8.0)
Protein, UA: POSITIVE — AB
Spec Grav, UA: 1.025 (ref 1.010–1.025)
Urobilinogen, UA: NEGATIVE E.U./dL — AB

## 2018-01-25 MED ORDER — CIPROFLOXACIN HCL 500 MG PO TABS: 500.0000 mg | ORAL_TABLET | Freq: Two times a day (BID) | ORAL | 0 refills | Status: DC

## 2018-01-25 NOTE — Patient Instructions (Addendum)
Treat for infection today   As discussed .   Keep appt with urology .  Begin cipro  50o mg  Twice a day for 7 days . And then  Plan follow up .     Should have fu urinalysis eval  after treatment  .

## 2018-01-26 ENCOUNTER — Ambulatory Visit (INDEPENDENT_AMBULATORY_CARE_PROVIDER_SITE_OTHER)
Admission: EM | Admit: 2018-01-26 | Discharge: 2018-01-26 | Disposition: A | Discharge status: Home / Self Care | Payer: Medicare Other | Source: Home / Self Care | Attending: Family Medicine | Admitting: Family Medicine

## 2018-01-26 ENCOUNTER — Emergency Department (HOSPITAL_COMMUNITY): Payer: Medicare Other

## 2018-01-26 ENCOUNTER — Observation Stay (HOSPITAL_COMMUNITY)
Admission: EM | Admit: 2018-01-26 | Discharge: 2018-01-28 | Disposition: A | Discharge status: Skilled Nursing Facility | Payer: Medicare Other | Attending: Internal Medicine | Admitting: Internal Medicine

## 2018-01-26 ENCOUNTER — Encounter (HOSPITAL_COMMUNITY): Payer: Self-pay | Admitting: Family Medicine

## 2018-01-26 ENCOUNTER — Encounter (HOSPITAL_COMMUNITY): Payer: Self-pay

## 2018-01-26 DIAGNOSIS — Z7902 Long term (current) use of antithrombotics/antiplatelets: Secondary | ICD-10-CM | POA: Diagnosis not present

## 2018-01-26 DIAGNOSIS — G9341 Metabolic encephalopathy: Secondary | ICD-10-CM | POA: Diagnosis not present

## 2018-01-26 DIAGNOSIS — Z9842 Cataract extraction status, left eye: Secondary | ICD-10-CM | POA: Diagnosis not present

## 2018-01-26 DIAGNOSIS — N183 Chronic kidney disease, stage 3 (moderate): Secondary | ICD-10-CM | POA: Diagnosis not present

## 2018-01-26 DIAGNOSIS — I700 Atherosclerosis of aorta: Secondary | ICD-10-CM | POA: Insufficient documentation

## 2018-01-26 DIAGNOSIS — E871 Hypo-osmolality and hyponatremia: Secondary | ICD-10-CM | POA: Insufficient documentation

## 2018-01-26 DIAGNOSIS — Z7982 Long term (current) use of aspirin: Secondary | ICD-10-CM | POA: Insufficient documentation

## 2018-01-26 DIAGNOSIS — Z951 Presence of aortocoronary bypass graft: Secondary | ICD-10-CM | POA: Diagnosis not present

## 2018-01-26 DIAGNOSIS — I6529 Occlusion and stenosis of unspecified carotid artery: Secondary | ICD-10-CM | POA: Insufficient documentation

## 2018-01-26 DIAGNOSIS — Z87440 Personal history of urinary (tract) infections: Secondary | ICD-10-CM | POA: Diagnosis not present

## 2018-01-26 DIAGNOSIS — R338 Other retention of urine: Secondary | ICD-10-CM | POA: Insufficient documentation

## 2018-01-26 DIAGNOSIS — F0150 Vascular dementia without behavioral disturbance: Secondary | ICD-10-CM | POA: Diagnosis not present

## 2018-01-26 DIAGNOSIS — I6509 Occlusion and stenosis of unspecified vertebral artery: Secondary | ICD-10-CM | POA: Diagnosis not present

## 2018-01-26 DIAGNOSIS — R410 Disorientation, unspecified: Secondary | ICD-10-CM | POA: Diagnosis not present

## 2018-01-26 DIAGNOSIS — I2510 Atherosclerotic heart disease of native coronary artery without angina pectoris: Secondary | ICD-10-CM | POA: Insufficient documentation

## 2018-01-26 DIAGNOSIS — R339 Retention of urine, unspecified: Secondary | ICD-10-CM

## 2018-01-26 DIAGNOSIS — N390 Urinary tract infection, site not specified: Secondary | ICD-10-CM

## 2018-01-26 DIAGNOSIS — Z79899 Other long term (current) drug therapy: Secondary | ICD-10-CM | POA: Diagnosis not present

## 2018-01-26 DIAGNOSIS — Z823 Family history of stroke: Secondary | ICD-10-CM | POA: Insufficient documentation

## 2018-01-26 DIAGNOSIS — I130 Hypertensive heart and chronic kidney disease with heart failure and stage 1 through stage 4 chronic kidney disease, or unspecified chronic kidney disease: Secondary | ICD-10-CM | POA: Diagnosis not present

## 2018-01-26 DIAGNOSIS — Z961 Presence of intraocular lens: Secondary | ICD-10-CM | POA: Insufficient documentation

## 2018-01-26 DIAGNOSIS — Z87891 Personal history of nicotine dependence: Secondary | ICD-10-CM | POA: Insufficient documentation

## 2018-01-26 DIAGNOSIS — N401 Enlarged prostate with lower urinary tract symptoms: Secondary | ICD-10-CM | POA: Diagnosis not present

## 2018-01-26 DIAGNOSIS — J9811 Atelectasis: Secondary | ICD-10-CM | POA: Insufficient documentation

## 2018-01-26 DIAGNOSIS — Z9841 Cataract extraction status, right eye: Secondary | ICD-10-CM | POA: Insufficient documentation

## 2018-01-26 DIAGNOSIS — I69354 Hemiplegia and hemiparesis following cerebral infarction affecting left non-dominant side: Secondary | ICD-10-CM | POA: Insufficient documentation

## 2018-01-26 DIAGNOSIS — Z8249 Family history of ischemic heart disease and other diseases of the circulatory system: Secondary | ICD-10-CM | POA: Insufficient documentation

## 2018-01-26 DIAGNOSIS — E785 Hyperlipidemia, unspecified: Secondary | ICD-10-CM | POA: Insufficient documentation

## 2018-01-26 DIAGNOSIS — I252 Old myocardial infarction: Secondary | ICD-10-CM | POA: Insufficient documentation

## 2018-01-26 DIAGNOSIS — I5032 Chronic diastolic (congestive) heart failure: Secondary | ICD-10-CM | POA: Diagnosis not present

## 2018-01-26 DIAGNOSIS — Z66 Do not resuscitate: Secondary | ICD-10-CM | POA: Insufficient documentation

## 2018-01-26 DIAGNOSIS — Z882 Allergy status to sulfonamides status: Secondary | ICD-10-CM | POA: Insufficient documentation

## 2018-01-26 LAB — CBC WITH DIFFERENTIAL/PLATELET
Abs Immature Granulocytes: 0.1 10*3/uL (ref 0.0–0.1)
Basophils Absolute: 0 10*3/uL (ref 0.0–0.1)
Basophils Relative: 0 %
EOS ABS: 0.1 10*3/uL (ref 0.0–0.7)
EOS PCT: 2 %
HEMATOCRIT: 36.1 % — AB (ref 39.0–52.0)
HEMOGLOBIN: 12.2 g/dL — AB (ref 13.0–17.0)
Immature Granulocytes: 1 %
LYMPHS ABS: 1.5 10*3/uL (ref 0.7–4.0)
Lymphocytes Relative: 19 %
MCH: 30.3 pg (ref 26.0–34.0)
MCHC: 33.8 g/dL (ref 30.0–36.0)
MCV: 89.6 fL (ref 78.0–100.0)
MONO ABS: 0.9 10*3/uL (ref 0.1–1.0)
MONOS PCT: 11 %
Neutro Abs: 5.1 10*3/uL (ref 1.7–7.7)
Neutrophils Relative %: 67 %
Platelets: 213 10*3/uL (ref 150–400)
RBC: 4.03 MIL/uL — AB (ref 4.22–5.81)
RDW: 12.7 % (ref 11.5–15.5)
WBC: 7.7 10*3/uL (ref 4.0–10.5)

## 2018-01-26 LAB — URINALYSIS, ROUTINE W REFLEX MICROSCOPIC
Bilirubin Urine: NEGATIVE
Glucose, UA: NEGATIVE mg/dL
KETONES UR: NEGATIVE mg/dL
Nitrite: NEGATIVE
PROTEIN: NEGATIVE mg/dL
Specific Gravity, Urine: 1.004 — ABNORMAL LOW (ref 1.005–1.030)
pH: 8 (ref 5.0–8.0)

## 2018-01-26 LAB — I-STAT TROPONIN, ED: Troponin i, poc: 0 ng/mL (ref 0.00–0.08)

## 2018-01-26 LAB — COMPREHENSIVE METABOLIC PANEL
ALBUMIN: 3.2 g/dL — AB (ref 3.5–5.0)
ALT: 6 U/L — AB (ref 17–63)
AST: 19 U/L (ref 15–41)
Alkaline Phosphatase: 66 U/L (ref 38–126)
Anion gap: 9 (ref 5–15)
BILIRUBIN TOTAL: 0.9 mg/dL (ref 0.3–1.2)
BUN: 9 mg/dL (ref 6–20)
CHLORIDE: 93 mmol/L — AB (ref 101–111)
CO2: 22 mmol/L (ref 22–32)
CREATININE: 1 mg/dL (ref 0.61–1.24)
Calcium: 8 mg/dL — ABNORMAL LOW (ref 8.9–10.3)
GFR calc Af Amer: >60 mL/min (ref >60)
GLUCOSE: 112 mg/dL — AB (ref 65–99)
Potassium: 4.2 mmol/L (ref 3.5–5.1)
Sodium: 124 mmol/L — ABNORMAL LOW (ref 135–145)
Total Protein: 6.2 g/dL — ABNORMAL LOW (ref 6.5–8.1)

## 2018-01-26 LAB — VALPROIC ACID LEVEL: VALPROIC ACID LVL: 100 ug/mL (ref 50.0–100.0)

## 2018-01-26 MED ORDER — CLOPIDOGREL BISULFATE 75 MG PO TABS
75.0000 mg | ORAL_TABLET | Freq: Every day | ORAL | Status: DC
  Administered 2018-01-27 – 2018-01-28 (×2): 75 mg via ORAL
  Filled 2018-01-26 (×3): qty 1

## 2018-01-26 MED ORDER — SODIUM CHLORIDE 0.9% FLUSH
3.0000 mL | Freq: Two times a day (BID) | INTRAVENOUS | Status: DC
  Administered 2018-01-27 (×3): 3 mL via INTRAVENOUS

## 2018-01-26 MED ORDER — FOLIC ACID 1 MG PO TABS
1.0000 mg | ORAL_TABLET | Freq: Every day | ORAL | Status: DC
  Administered 2018-01-27 – 2018-01-28 (×2): 1 mg via ORAL
  Filled 2018-01-26 (×2): qty 1

## 2018-01-26 MED ORDER — HYDROCERIN EX CREA
1.0000 "application " | TOPICAL_CREAM | Freq: Two times a day (BID) | CUTANEOUS | Status: DC
  Administered 2018-01-27 – 2018-01-28 (×5): 1 via TOPICAL
  Filled 2018-01-26: qty 113

## 2018-01-26 MED ORDER — TAMSULOSIN HCL 0.4 MG PO CAPS
0.4000 mg | ORAL_CAPSULE | Freq: Every day | ORAL | Status: DC
  Administered 2018-01-27 – 2018-01-28 (×3): 0.4 mg via ORAL
  Filled 2018-01-26 (×3): qty 1

## 2018-01-26 MED ORDER — CIPROFLOXACIN HCL 500 MG PO TABS
500.0000 mg | ORAL_TABLET | Freq: Two times a day (BID) | ORAL | Status: DC
  Administered 2018-01-27 – 2018-01-28 (×5): 500 mg via ORAL
  Filled 2018-01-26 (×6): qty 1

## 2018-01-26 MED ORDER — SODIUM CHLORIDE 0.9 % IV SOLN
1.0000 g | Freq: Once | INTRAVENOUS | Status: AC
  Administered 2018-01-26: 1 g via INTRAVENOUS
  Filled 2018-01-26: qty 10

## 2018-01-26 MED ORDER — ASPIRIN EC 325 MG PO TBEC
325.0000 mg | DELAYED_RELEASE_TABLET | Freq: Every day | ORAL | Status: DC
  Administered 2018-01-27 – 2018-01-28 (×2): 325 mg via ORAL
  Filled 2018-01-26 (×2): qty 1

## 2018-01-26 MED ORDER — ENOXAPARIN SODIUM 40 MG/0.4ML ~~LOC~~ SOLN
40.0000 mg | SUBCUTANEOUS | Status: DC
  Administered 2018-01-27 – 2018-01-28 (×2): 40 mg via SUBCUTANEOUS
  Filled 2018-01-26 (×2): qty 0.4

## 2018-01-26 MED ORDER — LACTATED RINGERS IV SOLN
INTRAVENOUS | Status: AC
  Administered 2018-01-27: via INTRAVENOUS

## 2018-01-26 MED ORDER — ACETAMINOPHEN 325 MG PO TABS: 325.0000 mg | ORAL_TABLET | ORAL | Status: DC | PRN

## 2018-01-26 MED ORDER — ATORVASTATIN CALCIUM 80 MG PO TABS
80.0000 mg | ORAL_TABLET | Freq: Every day | ORAL | Status: DC
  Administered 2018-01-27 – 2018-01-28 (×2): 80 mg via ORAL
  Filled 2018-01-26 (×2): qty 1

## 2018-01-26 MED ORDER — METOPROLOL TARTRATE 50 MG PO TABS
50.0000 mg | ORAL_TABLET | Freq: Two times a day (BID) | ORAL | Status: DC
  Administered 2018-01-27 – 2018-01-28 (×5): 50 mg via ORAL
  Filled 2018-01-26 (×6): qty 1

## 2018-01-26 NOTE — ED Notes (Signed)
Patient transported to CT 

## 2018-01-26 NOTE — ED Notes (Signed)
1st set blood cultures, and rainbow labs sent down to main lab with white labels and "save" sunquest labels.

## 2018-01-26 NOTE — ED Notes (Signed)
Wife states pt had recent fall on blood thinners with definite head injury.  Pt refused transport at that time.  Wife at bedside states pt has become more combative at home and had a foley placed at urgent care today.

## 2018-01-26 NOTE — ED Notes (Signed)
Safety sitter at bedside, family went home for the night

## 2018-01-26 NOTE — ED Notes (Signed)
Pt removing monitor, tried to redirect and pt becomes more agitated.  Family at bedside

## 2018-01-26 NOTE — ED Notes (Signed)
Admitting at bedside 

## 2018-01-26 NOTE — ED Provider Notes (Signed)
[name] EMERGENCY DEPARTMENT Provider Note   CSN: [id number] Arrival date & time: 01/26/18  1815     History   Chief Complaint Chief Complaint  Patient presents with  . Altered Mental Status    HPI Thomas [name]. is a 77 y.o. male.  He is brought in by his wife today for increased confusion.  It sounds like he has had some long-standing vascular dementia with intermittent outbursts and has been treated with Depakote for this.  He had a couple falls over Memorial Day weekend striking his head.  Since then he has had a more acute decline being more confused and unsafe at home.  Over the past few days there is been some issues with urinary retention and he actually went to urgent care today where they placed a catheter and to relieve him of this.  There is been no reported fever.  Currently the patient denies complaints but is a very poor historian.  Level 5 caveat secondary to dementia.  History is mostly given by the patient's wife.  The history is provided by the patient and the spouse. The history is limited by the condition of the patient.  Altered Mental Status   This is a new problem. The current episode started more than 1 week ago. The problem has been gradually worsening. Associated symptoms include confusion, agitation and violence. His past medical history is significant for CVA, dementia and head trauma.    Past Medical History:  Diagnosis Date  . CHF (congestive heart failure) (HCC) 1999  . CKD (chronic kidney disease), stage II   . Coronary artery disease   . Hx of colonic polyps   . Hyperlipidemia   . Hyperplastic colon polyp 04/15/2007  . Hypertension   . Prostatic hypertrophy, benign    with elevated PSA  . Stroke (HCC)   . TIA (transient ischemic attack) 2003   "mini stroke" (03/19/2013)    Patient Active Problem List   Diagnosis Date Noted  . 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 Foul smelling urine   .  Leukocytosis   . Hyponatremia   . 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 Benign essential HTN   . Coronary artery disease involving native coronary artery of native heart without angina pectoris   . Stage 3 chronic kidney disease (HCC)   . Acute lower UTI   . Seasonal allergies   . Stroke (HCC) 11/04/2017  . HLD (hyperlipidemia) 11/04/2017  . Chronic diastolic CHF (congestive heart failure) (HCC) 11/04/2017  . Acute renal failure superimposed on stage 2 chronic kidney disease (HCC) 11/04/2017  . Acute metabolic encephalopathy 11/04/2017  . UTI (urinary tract infection) 11/04/2017  . AKI (acute kidney injury) (HCC)   . History of stroke   . Prediabetes   . Left hemiparesis (HCC) 03/19/2013  . Cerebral infarction (HCC) 03/19/2013  . Left leg weakness 03/19/2013  . CAD, ARTERY BYPASS GRAFT 09/30/2008  . 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 Borderline hyperglycemia 12/03/2007  . Other and unspecified hyperlipidemia 07/28/2007  . Essential hypertension 07/28/2007  . 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 Coronary atherosclerosis 07/28/2007  . BENIGN PROSTATIC HYPERTROPHY 07/28/2007  . COLONIC POLYPS, 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    Past Surgical History:  Procedure Laterality Date  . CARDIAC CATHETERIZATION    . CATARACT EXTRACTION W/ INTRAOCULAR LENS  IMPLANT, BILATERAL Bilateral 1998  . COLONOSCOPY  2008  . CORONARY ARTERY BYPASS GRAFT  1999   x4  . LOOP  RECORDER INSERTION N/A 11/05/2017   Procedure: LOOP RECORDER INSERTION;  Surgeon: Hillis Range, MD;  Location: [facility] INVASIVE CV LAB;  Service: Cardiovascular;  Laterality: N/A;  . TEE WITHOUT CARDIOVERSION N/A 11/05/2017   Procedure: TRANSESOPHAGEAL ECHOCARDIOGRAM (TEE) WITH LOOP;  Surgeon: Lars Masson, MD;  Location: [facility] ENDOSCOPY;  Service: Cardiovascular;  Laterality: N/A;        Home Medications    Prior to Admission medications   Medication Sig Start Date End Date Taking? Authorizing Provider  acetaminophen (TYLENOL)  325 MG tablet Take 1-2 tablets (325-650 mg total) by mouth every 4 (four) hours as needed for mild pain. 11/19/17  Yes Love, Evlyn Kanner, PA-C  aspirin EC 325 MG EC tablet Take 1 tablet (325 mg total) by mouth daily. 11/06/17  Yes Vann, Jessica U, DO  atorvastatin (LIPITOR) 80 MG tablet Take 1 tablet (80 mg total) by mouth daily. Patient taking differently: Take 80 mg by mouth daily at 6 PM.  11/06/17  Yes Vann, Jessica U, DO  ciprofloxacin (CIPRO) 500 MG tablet Take 1 tablet (500 mg total) by mouth 2 (two) times daily. 01/25/18  Yes Panosh, Neta Mends, MD  clopidogrel (PLAVIX) 75 MG tablet TAKE 1 TABLET BY MOUTH  DAILY WITH BREAKFAST Patient taking differently: TAKE 1 TABLET (75mg ) BY MOUTH  DAILY WITH BREAKFAST 06/10/17  Yes Wendall Stade, MD  divalproex (DEPAKOTE) 500 MG DR tablet Take 1 tablet (500 mg total) by mouth 2 (two) times daily. 01/21/18  Yes Ranelle Oyster, MD  Flax OIL Take 1 capsule by mouth daily.     Yes [provider]  fluticasone (FLONASE) 50 MCG/ACT nasal spray Place 2 sprays into both nostrils daily as needed for allergies or rhinitis. 11/19/17  Yes Love, Evlyn Kanner, PA-C  folic acid (FOLVITE) 1 MG tablet TAKE 1 TABLET EVERY DAY Patient taking differently: TAKE 1 TABLET (1mg ) by mouth EVERY DAY 01/28/17  Yes Wendall Stade, MD  hydrocerin (EUCERIN) CREA Apply 1 application topically 2 (two) times daily. 11/19/17  Yes Love, Evlyn Kanner, PA-C  loratadine (CLARITIN) 10 MG tablet Take 10 mg by mouth at bedtime.    Yes [provider]  metoprolol tartrate (LOPRESSOR) 50 MG tablet TAKE 1 TABLET BY MOUTH TWO  TIMES DAILY Patient taking differently: TAKE 1 TABLET (50mg ) BY MOUTH TWO  TIMES DAILY 01/28/17  Yes Wendall Stade, MD  Omega-3 Fatty Acids (FISH OIL) 1000 MG CAPS Take 1 capsule by mouth daily.     Yes [provider]  QUEtiapine (SEROQUEL) 25 MG tablet Take 1 tablet (25 mg total) by mouth at bedtime. 01/23/18  Yes Anson Fret, MD  tamsulosin (FLOMAX) 0.4 MG  CAPS capsule Take 1 capsule (0.4 mg total) by mouth daily after supper. 01/21/18  Yes Ranelle Oyster, MD  ketoconazole (NIZORAL) 2 % cream Apply 1 application topically 2 (two) times daily. Patient not taking: Reported on 01/26/2018 11/25/17   Nelwyn Salisbury, MD    Family History Family History  Problem Relation Age of Onset  . Coronary artery disease Mother   . Heart disease Mother   . Stroke Father   . Heart disease Father   . Heart disease Brother     Social History Social History   Tobacco Use  . Smoking status: Former Smoker    Packs/day: 0.75    Years: 50.00    Pack years: 37.50    Types: Cigarettes    Last attempt to quit: 03/19/2013  Years since quitting: 4.8  . Smokeless tobacco: Never Used  Substance Use Topics  . Alcohol use: Yes    Alcohol/week: 0.6 oz    Types: 1 Cans of beer per week    Comment: 03/19/2013 "1 beer/week" does not drink anymore  . Drug use: No     Allergies   Sulfonamide derivatives   Review of Systems Review of Systems  Constitutional: Negative for chills and fever.  HENT: Negative for sore throat.   Eyes: Negative for pain and visual disturbance.  Respiratory: Negative for shortness of breath.   Cardiovascular: Negative for chest pain.  Gastrointestinal: Negative for abdominal pain.  Genitourinary: Positive for difficulty urinating. Negative for dysuria.  Musculoskeletal: Negative for back pain and neck pain.  Skin: Negative for rash.  Neurological: Negative for speech difficulty and numbness.  Psychiatric/Behavioral: Positive for agitation and confusion.     Physical Exam Updated Vital Signs BP (!) 156/80   Pulse 80   Resp 18   Ht 5\' 11"  (1.803 m)   Wt 78.5 kg (173 lb)   SpO2 97%   BMI 24.13 kg/m   Physical Exam  Constitutional: He appears well-developed and well-nourished.  HENT:  Head: Normocephalic and atraumatic.  Eyes: Conjunctivae are normal.  Neck: Neck supple.  Cardiovascular: Normal rate and regular  rhythm.  No murmur heard. Pulmonary/Chest: Effort normal and breath sounds normal. No respiratory distress.  Abdominal: Soft. There is no tenderness.  Musculoskeletal: He exhibits no edema.  Neurological: He is alert. He has normal strength. He is disoriented (to time). No cranial nerve deficit or sensory deficit. GCS eye subscore is 4. GCS verbal subscore is 5. GCS motor subscore is 6.  Skin: Skin is warm and dry.  Psychiatric: He has a normal mood and affect.  Nursing note and vitals reviewed.    ED Treatments / Results  Labs (all labs ordered are listed, but only abnormal results are displayed) Labs Reviewed  COMPREHENSIVE METABOLIC PANEL - Abnormal; Notable for the following components:      Result Value   Sodium 124 (*)    Chloride 93 (*)    Glucose, Bld 112 (*)    Calcium 8.0 (*)    Total Protein 6.2 (*)    Albumin 3.2 (*)    ALT 6 (*)    All other components within normal limits  CBC WITH DIFFERENTIAL/PLATELET - Abnormal; Notable for the following components:   RBC 4.03 (*)    Hemoglobin 12.2 (*)    HCT 36.1 (*)    All other components within normal limits  URINALYSIS, ROUTINE W REFLEX MICROSCOPIC - Abnormal; Notable for the following components:   Color, Urine STRAW (*)    Specific Gravity, Urine 1.004 (*)    Hgb urine dipstick LARGE (*)    Leukocytes, UA LARGE (*)    RBC / HPF >50 (*)    WBC, UA >50 (*)    Bacteria, UA RARE (*)    All other components within normal limits  BASIC METABOLIC PANEL - Abnormal; Notable for the following components:   Sodium 131 (*)    Chloride 96 (*)    Calcium 8.4 (*)    All other components within normal limits  OSMOLALITY - Abnormal; Notable for the following components:   Osmolality 273 (*)    All other components within normal limits  URINE CULTURE  VALPROIC ACID LEVEL  CBC  CORTISOL  TSH  OSMOLALITY, URINE  I-STAT TROPONIN, ED    EKG EKG Interpretation  Date/Time:  Sunday January 26 2018 19:29:50 EDT Ventricular  Rate:  75 PR Interval:    QRS Duration: 91 QT Interval:  343 QTC Calculation: 383 R Axis:   8 Text Interpretation:  Sinus rhythm Repol abnrm suggests ischemia, anterolateral similar to prior 5/19 Confirmed by Meridee Score [phone]) on 01/26/2018 7:43:26 PM   Radiology Dg Chest 2 View  Result Date: 01/26/2018 CLINICAL DATA:  Confusion with altered mental status EXAM: CHEST - 2 VIEW COMPARISON:  02/16/2014 FINDINGS: Post sternotomy changes. Left chest recording device. No significant effusion. Cardiomegaly with mild central vascular congestion. Aortic atherosclerosis. Atelectasis at the left base. No pneumothorax. IMPRESSION: 1. Mild cardiomegaly with minimal central vascular congestion. 2. Streaky atelectasis at the left base. Electronically Signed   By: Jasmine Pang M.D.   On: 01/26/2018 21:19   Ct Head Wo Contrast  Result Date: 01/26/2018 CLINICAL DATA:  77 y/o M; increased confusion. History of vascular dementia. EXAM: CT HEAD WITHOUT CONTRAST TECHNIQUE: Contiguous axial images were obtained from the base of the skull through the vertex without intravenous contrast. COMPARISON:  11/04/2017 MRI of the head, CT of the head, CT perfusion of the head, CTA of the head. FINDINGS: Brain: Chronic infarct of the right medial temporal lobe and occipital lobe. Confluent hypoattenuation in white matter is compatible with advanced chronic microvascular ischemic changes and stable from the prior study. There multiple chronic lacunar infarcts within the basal ganglia bilaterally and the pons. Stable brain parenchymal volume loss. Vascular: Calcific atherosclerosis of carotid siphons and vertebral arteries. Skull: Normal. Negative for fracture or focal lesion. Sinuses/Orbits: Mild mucosal thickening of the ethmoid air cells. Normal aeration of mastoid air cells. Bilateral intra-ocular lens replacement. Other: None. IMPRESSION: 1. No acute intracranial abnormality identified. 2. Stable advanced chronic  microvascular ischemic changes and parenchymal volume loss of the brain. Multiple stable chronic 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 distribution infarct. Electronically Signed   By: Mitzi Hansen M.D.   On: 01/26/2018 20:27    Procedures Procedures (including critical care time)  Medications Ordered in ED Medications  acetaminophen (TYLENOL) tablet 325-650 mg (has no administration in time range)  aspirin EC tablet 325 mg (325 mg Oral Given 01/27/18 0942)  atorvastatin (LIPITOR) tablet 80 mg (has no administration in time range)  ciprofloxacin (CIPRO) tablet 500 mg (500 mg Oral Given 01/27/18 0941)  clopidogrel (PLAVIX) tablet 75 mg (has no administration in time range)  folic acid (FOLVITE) tablet 1 mg (1 mg Oral Given 01/27/18 0941)  hydrocerin (EUCERIN) cream 1 application (1 application Topical Given 01/27/18 0018)  metoprolol tartrate (LOPRESSOR) tablet 50 mg (50 mg Oral Given 01/27/18 0941)  enoxaparin (LOVENOX) injection 40 mg (40 mg Subcutaneous Given 01/27/18 0942)  sodium chloride flush (NS) 0.9 % injection 3 mL (3 mLs Intravenous Given 01/27/18 0943)  tamsulosin (FLOMAX) capsule 0.4 mg (0.4 mg Oral Given 01/27/18 0016)  lactated ringers infusion ( Intravenous Stopped 01/27/18 0730)  cefTRIAXone (ROCEPHIN) 1 g in sodium chloride 0.9 % 100 mL IVPB (0 g Intravenous Stopped 01/26/18 2126)     Initial Impression / Assessment and Plan / ED Course  I have reviewed the triage vital signs and the nursing notes.  Pertinent labs & imaging results that were available during my care of the patient were reviewed by me and considered in my medical decision making (see chart for details).  Clinical Course as of Jan 27 1100  Sun Jan 26, 2018  2108 disCussed with tried hospitalist Dr.  Rema Jasmine who will evaluate the patient in the emergency department for admission.  Wife and son are updated on plan.   [MB]    Clinical Course User Index [MB] Terrilee Files, MD     Final Clinical Impressions(s) / ED Diagnoses   Final diagnoses:  Confusion  Hyponatremia  Urinary tract infection in male    ED Discharge Orders    None       Terrilee Files, MD 01/27/18 1102

## 2018-01-26 NOTE — ED Notes (Signed)
Patient transported to X-ray 

## 2018-01-26 NOTE — ED Triage Notes (Addendum)
Per EMS: Pt from home with c/o worsening dementia according to wife.  Pt has a hx of recent stroke which lead to vascular dementia.  Wife is caregiver and stating she can no longer take care of him.  Pt denies any pain.

## 2018-01-26 NOTE — ED Triage Notes (Addendum)
Pt here with hx of urethral stricture and chronic UTI. He is having urinary retention and voiding about 6 to 8 oz every few hours. Last voided at 1 pm. He has the sensation that there is still urine in his bladder and having slight abd discomfort. No distress at this time. He is being treated for UTI with cipro currently that was prescribed buy his doctor yesterday. He has no fever or pain with urination. He also was restarted on his Flomax Tuesday.

## 2018-01-26 NOTE — ED Provider Notes (Signed)
[facility]    CSN: [id number] Arrival date & time: 01/26/18  1314     History   Chief Complaint Chief Complaint  Patient presents with  . Urinary Retention    HPI Thomas Hislop. is a 77 y.o. male.   HPI  Thomas [name] is a pleasant 77 year old gentleman.  He has significant dementia.  He is brought in by his wife for urinary retention.  He is complaining of abdominal pain.  He is rocking back and forth.  He has known urinary stricture.  Known prostatic hypertrophy.  Known urinary tract infection.  No fever or chills.  No nausea or vomiting.  He is eating well.  He is not drinking very much.  She states that his dementia waxes and wanes.  She states is at his baseline now.  Past Medical History:  Diagnosis Date  . CHF (congestive heart failure) (HCC) 1999  . CKD (chronic kidney disease), stage II   . Coronary artery disease   . Hx of colonic polyps   . Hyperlipidemia   . Hyperplastic colon polyp 04/15/2007  . Hypertension   . Prostatic hypertrophy, benign    with elevated PSA  . Stroke (HCC)   . TIA (transient ischemic attack) 2003   "mini stroke" (03/19/2013)    Patient Active Problem List   Diagnosis Date Noted  . Delirium 01/26/2018  . 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 Foul smelling urine   . Leukocytosis   . Hyponatremia   . 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 Benign essential HTN   . Coronary artery disease involving native coronary artery of native heart without angina pectoris   . Stage 3 chronic kidney disease (HCC)   . Acute lower UTI   . Seasonal allergies   . Stroke (HCC) 11/04/2017  . HLD (hyperlipidemia) 11/04/2017  . Chronic diastolic CHF (congestive heart failure) (HCC) 11/04/2017  . Acute renal failure superimposed on stage 2 chronic kidney disease (HCC) 11/04/2017  . Acute metabolic encephalopathy 11/04/2017  . UTI (urinary tract infection) 11/04/2017  . AKI (acute kidney injury) (HCC)   . History of  stroke   . Prediabetes   . Left hemiparesis (HCC) 03/19/2013  . Cerebral infarction (HCC) 03/19/2013  . Left leg weakness 03/19/2013  . CAD, ARTERY BYPASS GRAFT 09/30/2008  . 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 Borderline hyperglycemia 12/03/2007  . Other and unspecified hyperlipidemia 07/28/2007  . Essential hypertension 07/28/2007  . 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 Coronary atherosclerosis 07/28/2007  . BENIGN PROSTATIC HYPERTROPHY 07/28/2007  . COLONIC POLYPS, 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    Past Surgical History:  Procedure Laterality Date  . CARDIAC CATHETERIZATION    . CATARACT EXTRACTION W/ INTRAOCULAR LENS  IMPLANT, BILATERAL Bilateral 1998  . COLONOSCOPY  2008  . CORONARY ARTERY BYPASS GRAFT  1999   x4  . LOOP RECORDER INSERTION N/A 11/05/2017   Procedure: LOOP RECORDER INSERTION;  Surgeon: Hillis Range, MD;  Location: [facility];  Service: Cardiovascular;  Laterality: N/A;  . TEE WITHOUT CARDIOVERSION N/A 11/05/2017   Procedure: TRANSESOPHAGEAL ECHOCARDIOGRAM (TEE) WITH LOOP;  Surgeon: Lars Masson, MD;  Location: [facility];  Service: Cardiovascular;  Laterality: N/A;       Home Medications    Prior to Admission medications   Medication Sig Start Date End Date Taking? Authorizing Provider  acetaminophen (TYLENOL) 325 MG tablet Take 1-2 tablets (325-650 mg total) by mouth every 4 (four) hours as needed for mild  pain. 11/19/17   Love, Evlyn Kanner, PA-C  aspirin EC 325 MG EC tablet Take 1 tablet (325 mg total) by mouth daily. 11/06/17   Joseph Art, DO  atorvastatin (LIPITOR) 80 MG tablet Take 1 tablet (80 mg total) by mouth daily. Patient taking differently: Take 80 mg by mouth daily at 6 PM.  11/06/17   Joseph Art, DO  ciprofloxacin (CIPRO) 500 MG tablet Take 1 tablet (500 mg total) by mouth 2 (two) times daily. 01/25/18   Panosh, Neta Mends, MD  clopidogrel (PLAVIX) 75 MG tablet TAKE 1 TABLET BY MOUTH  DAILY WITH BREAKFAST Patient taking differently: TAKE  1 TABLET (75mg ) BY MOUTH  DAILY WITH BREAKFAST 06/10/17   Wendall Stade, MD  divalproex (DEPAKOTE) 500 MG DR tablet Take 1 tablet (500 mg total) by mouth 2 (two) times daily. 01/21/18   Ranelle Oyster, MD  Flax OIL Take 1 capsule by mouth daily.      [provider]  fluticasone (FLONASE) 50 MCG/ACT nasal spray Place 2 sprays into both nostrils daily as needed for allergies or rhinitis. 11/19/17   Love, Evlyn Kanner, PA-C  folic acid (FOLVITE) 1 MG tablet TAKE 1 TABLET EVERY DAY Patient taking differently: TAKE 1 TABLET (1mg ) by mouth EVERY DAY 01/28/17   Wendall Stade, MD  hydrocerin (EUCERIN) CREA Apply 1 application topically 2 (two) times daily. 11/19/17   Love, Evlyn Kanner, PA-C  ketoconazole (NIZORAL) 2 % cream Apply 1 application topically 2 (two) times daily. Patient not taking: Reported on 01/26/2018 11/25/17   Nelwyn Salisbury, MD  loratadine (CLARITIN) 10 MG tablet Take 10 mg by mouth at bedtime.     [provider]  metoprolol tartrate (LOPRESSOR) 50 MG tablet TAKE 1 TABLET BY MOUTH TWO  TIMES DAILY Patient taking differently: TAKE 1 TABLET (50mg ) BY MOUTH TWO  TIMES DAILY 01/28/17   Wendall Stade, MD  Omega-3 Fatty Acids (FISH OIL) 1000 MG CAPS Take 1 capsule by mouth daily.      [provider]  QUEtiapine (SEROQUEL) 25 MG tablet Take 1 tablet (25 mg total) by mouth at bedtime. 01/23/18   Anson Fret, MD  tamsulosin (FLOMAX) 0.4 MG CAPS capsule Take 1 capsule (0.4 mg total) by mouth daily after supper. 01/21/18   Ranelle Oyster, MD    Family History Family History  Problem Relation Age of Onset  . Coronary artery disease Mother   . Heart disease Mother   . Stroke Father   . Heart disease Father   . Heart disease Brother     Social History Social History   Tobacco Use  . Smoking status: Former Smoker    Packs/day: 0.75    Years: 50.00    Pack years: 37.50    Types: Cigarettes    Last attempt to quit: 03/19/2013    Years since quitting: 4.8  .  Smokeless tobacco: Never Used  Substance Use Topics  . Alcohol use: Yes    Alcohol/week: 0.6 oz    Types: 1 Cans of beer per week    Comment: 03/19/2013 "1 beer/week" does not drink anymore  . Drug use: No     Allergies   Sulfonamide derivatives   Review of Systems Review of Systems  Unable to perform ROS: Dementia  Constitutional: Positive for activity change. Negative for appetite change, chills and fever.  HENT: Positive for trouble swallowing.   Respiratory: Negative for cough.   Gastrointestinal: Positive for abdominal distention and  abdominal pain. Negative for diarrhea and vomiting.  Genitourinary: Positive for difficulty urinating.  Skin: Negative for color change and rash.  Psychiatric/Behavioral: Positive for behavioral problems and confusion.  All other systems reviewed and are negative. Review of systems as per wife  Physical Exam Triage Vital Signs ED Triage Vitals [01/26/18 1343]  Enc Vitals Group     BP 137/79     Pulse Rate 69     Resp 18     Temp 97.8 F (36.6 C)     Temp src      SpO2 100 %     Weight      Height      Head Circumference      Peak Flow      Pain Score      Pain Loc      Pain Edu?      Excl. in GC?    No data found.  Updated Vital Signs BP 137/79   Pulse 69   Temp 97.8 F (36.6 C)   Resp 18   SpO2 100%       Physical Exam  Constitutional: He appears well-developed and well-nourished. No distress.  Patient cooperative.  Answers direct questions.  Allow his wife to do most of the talking.  Appears in no discomfort sitting in the wheelchair.  HENT:  Head: Normocephalic and atraumatic.  Mouth/Throat: Oropharynx is clear and moist.  Eyes: Pupils are equal, round, and reactive to light. Conjunctivae are normal.  Neck: Normal range of motion.  Cardiovascular: Normal rate and regular rhythm.  Murmur heard. Pulmonary/Chest: Effort normal and breath sounds normal. No respiratory distress.  Abdominal: Soft. He exhibits no  distension. There is tenderness.  Lower abdomen is acutely tender.  Guarding.  No rebound.  Musculoskeletal: Normal range of motion. He exhibits no edema.  Skin: Skin is warm and dry.   Because of wife reports of BPH and urinary stricture in the past, and decreased urination.  He appears to have urinary retention with lower abdominal pain.  With permission a Foley catheter is placed.  Patient yells out loudly, but lays still for the procedure.  Over 500 cc of cloudy urine drained into the bag.  Urinalysis not repeated, as it was done yesterday with culture the prior to being started on antibiotics.  UC Treatments / Results  Labs (all labs ordered are listed, but only abnormal results are displayed) Labs Reviewed - No data to display  EKG None  Radiology   Procedures Procedures  Foley catheter placement.  Uncomplicated  Medications Ordered in UC Medications - No data to display  Initial Impression / Assessment and Plan / UC Course  I have reviewed the triage vital signs and the nursing notes.  Pertinent labs & imaging results that were available during my care of the patient were reviewed by me and considered in my medical decision making (see chart for details).      Final Clinical Impressions(s) / UC Diagnoses   Final diagnoses:  Urinary retention  Lower urinary tract infectious disease     Discharge Instructions     Give plenty of fluids Water is best but may be fluid of choice ( without caffeine) Continue cipro/flomax Call your doctor tomorrow to see if the urology appointment can be moved up. Return for fever, pain, foley problems   ED Prescriptions    None     Controlled Substance Prescriptions [place] Controlled Substance Registry consulted? Not Applicable   Eustace Moore, MD 01/26/18  2213  

## 2018-01-26 NOTE — H&P (Signed)
History and Physical   Thomas [name]. ZHY:[id number] DOB: [birth year] DOA: 01/26/2018  PCP: Shirline Frees, NP  Chief Complaint: confusion  HPI:  This is a 77 year old man with medical problems including coronary artery disease status post CABG in 1999, hypertension, prior stroke with residual left hemibody weakness as well as suspected vascular dementia with behavioral disturbances. History is obtained in talking with spouse as well as son present at bedside, as well as review of medical record.  Report the patient has had a decline over the past 2-3 weeks. He was hospitalized earlier this year with a stroke as well as a stay at the rehabilitation facility. He was placed on Seroquel as well as Depakote for mood-specifically he has struggled with emotional outbursts and anger. He lives with his spouse home, spouse reports frequent profanity use. She cites that they increase his Depakote dose and hopes of helping with this. Normally, the patient is oriented to date as well as time. His motion outbursts or periodic and typically to affect his memory. They report urinary retention for the past 3 days, associated symptoms include urinary frequency as well as suprapubic discomfort. Controlling factor thought to be constipation, this resolved with use of mag citrate. However, his urinary retention and urinary frequency persisted warranting evaluation in urgent care. He was thought to have a urinary tract infection started on ciprofloxacin, and upon reevaluation had a Foley catheter placed for urinary retention. Shortly after arrival home from urgent care on the day of admission he continued to be worse as far as mental status goes, continued to be less interactive and more confused. This prompted medical evaluation in emergency room.  The patient cannot reliably answer questions given his delirium. Family reports that once Foley catheter was placed large amount of urine was produced.  ED Course:  emergency room vital signs are normal for normal heart rate, respiratory rate of 23 which normalized, systolic blood pressure ranging 120s to 150s. Workup included BMP which revealed sodium of 124. CBC without significant derangement. Urinalysis revealed specific gravity of 1.004, large leukocytes. Valproic acid level of 100. Review of his chart reveals urine culture drawn the day prior to admission of having over 100,000 colony forming units of gram every months. Patient was given ceftriaxone in the emergency room. Hospital medicine consulted for further management.  Review of Systems: A complete ROS was obtained; pertinent positives negatives are denoted in the HPI. Otherwise, all systems are negative.   Past Medical History:  Diagnosis Date  . CHF (congestive heart failure) (HCC) 1999  . CKD (chronic kidney disease), stage II   . Coronary artery disease   . Hx of colonic polyps   . Hyperlipidemia   . Hyperplastic colon polyp 04/15/2007  . Hypertension   . Prostatic hypertrophy, benign    with elevated PSA  . Stroke (HCC)   . TIA (transient ischemic attack) 2003   "mini stroke" (03/19/2013)   Social History   Socioeconomic History  . Marital status: Married    Spouse name: cathy  . Number of children: 2  . Years of education: college  . Highest education level: Not on file  Occupational History  . Occupation: retired  Engineer, production  . Financial resource strain: Not on file  . Food insecurity:    Worry: Not on file    Inability: Not on file  . Transportation needs:    Medical: Not on file    Non-medical: Not on file  Tobacco Use  . Smoking status:  Former Smoker    Packs/day: 0.75    Years: 50.00    Pack years: 37.50    Types: Cigarettes    Last attempt to quit: 03/19/2013    Years since quitting: 4.8  . Smokeless tobacco: Never Used  Substance and Sexual Activity  . Alcohol use: Yes    Alcohol/week: 0.6 oz    Types: 1 Cans of beer per week    Comment: 03/19/2013 "1  beer/week" does not drink anymore  . Drug use: No  . Sexual activity: Not Currently  Lifestyle  . Physical activity:    Days per week: Not on file    Minutes per session: Not on file  . Stress: Not on file  Relationships  . Social connections:    Talks on phone: Not on file    Gets together: Not on file    Attends religious service: Not on file    Active member of club or organization: Not on file    Attends meetings of clubs or organizations: Not on file    Relationship status: Not on file  . Intimate partner violence:    Fear of current or ex partner: Not on file    Emotionally abused: Not on file    Physically abused: Not on file    Forced sexual activity: Not on file  Other Topics Concern  . Not on file  Social History Narrative   Patient lives at home with wife [name])   Retired.   Education one year of college.   Right handed.   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    Family History  Problem Relation Age of Onset  . Coronary artery disease Mother   . Heart disease Mother   . Stroke Father   . Heart disease Father   . Heart disease Brother     Physical Exam: Vitals:   01/26/18 2056 01/26/18 2057 01/26/18 2100 01/26/18 2130  BP: (!) 150/77  (!) 149/77 121/71  Pulse: 76 76 77 77  Resp: 13 14 18 14   SpO2: 97% 100% 99% 96%  Weight:      Height:       General: Elderly white man, appears sleepy, awakens to voice for a few seconds and then closes eyes ENT: mildly hard of hearing, MMM. Cardiovascular: RRR. No M/R/G. No LE edema.  Respiratory: CTA bilaterally in anterior lung fields. No wheezes or crackles. Normal respiratory effort. Abdomen: Soft, non-tender.  GU: foley catheter in place draining yellow urine Skin: No rash or induration seen on limited exam. Bilateral ecchymoses b/l forearms, extensor surfaces Musculoskeletal: No gross deformity Psychiatric: Flat affect Neurologic: Not oriented to year (1999), or hospital [facility]), waxing and waning level of  consciousness  I have personally reviewed the following labs, culture data, and imaging studies.  Assessment/Plan:  #Delirium In speaking with available family, seems like at baseline memory intact and is oriented.  Currently he has acute worsening over past 2 weeks, with increasing confusion and waxing and waning level of consciousness. Suspect delirium is multifactorial - including: hyponatremia, urinary retention, possibly UTI contribution. Will focus on optimizing these conditions while providing gentle re-assurance and re-orientation efforts.  #Hyponatremia Appears "euvolemic" at this time. May be related to post-obstructive diuresis - supportive of by low urinary spec gravity.  Will provide LR at 100 cc q h x 10 hrs to keep up with urinary output.  Diagnostics to further evaluate etiology of hyponatremia: serum osm, urine osm, TSH, cortisol.  Holding medications that may also  be playing a role - Seroquel and valproic acid. Goal is to not correct by > 10 mmol/L in 24 hours.  #Urinary retention with BPH Suspect related to BPH, possibly exacerbated by UTI.  Continue current foley catheter and monitor progress. Continue tamsulosin.  #UTI, likely Day prior to admission with > 100,000 CFU of GNRs. Awaiting speciation. Most recent urine culture microbes were multi-drug resistant organisms, but were susceptible to ciprofloxacin; thus - will continue for now while awaiting speciation.  #Other problems: -Prior CVA: with resultant left sided weakness; needing help with ADLs; will continue dual antiplatelet therapy (note: was told he should stop ASA at end of June 2019); continue statin -Vascular dementia with behavioral disturbance, suspected: holding CNS acting agents for now given delirium and possible contribution to hyponatremia, continue to monitor  DVT prophylaxis: Subq Lovenox Code Status: DNR / DNI after discussion on admission Disposition Plan: Anticipate D/C home in 2-5 days Consults  called: none Admission status: admit to hospital medicine   Laurell Roof, MD [facility] Page:[phone]  If 7PM-7AM, please contact night-coverage [email or web address] Password [email or web address]

## 2018-01-26 NOTE — ED Notes (Signed)
Report attempted 

## 2018-01-26 NOTE — Discharge Instructions (Signed)
Give plenty of fluids Water is best but may be fluid of choice ( without caffeine) Continue cipro/flomax Call your doctor tomorrow to see if the urology appointment can be moved up. Return for fever, pain, foley problems

## 2018-01-27 ENCOUNTER — Ambulatory Visit: Payer: Medicare Other | Admitting: Speech Pathology

## 2018-01-27 DIAGNOSIS — R410 Disorientation, unspecified: Secondary | ICD-10-CM | POA: Diagnosis not present

## 2018-01-27 LAB — CORTISOL: CORTISOL PLASMA: 11.2 ug/dL

## 2018-01-27 LAB — BASIC METABOLIC PANEL
Anion gap: 10 (ref 5–15)
BUN: 6 mg/dL (ref 6–20)
CALCIUM: 8.4 mg/dL — AB (ref 8.9–10.3)
CO2: 25 mmol/L (ref 22–32)
Chloride: 96 mmol/L — ABNORMAL LOW (ref 101–111)
Creatinine, Ser: 0.98 mg/dL (ref 0.61–1.24)
GFR calc Af Amer: >60 mL/min (ref >60)
GLUCOSE: 93 mg/dL (ref 65–99)
Potassium: 4.4 mmol/L (ref 3.5–5.1)
SODIUM: 131 mmol/L — AB (ref 135–145)

## 2018-01-27 LAB — CBC
HCT: 39.7 % (ref 39.0–52.0)
Hemoglobin: 13.2 g/dL (ref 13.0–17.0)
MCH: 30 pg (ref 26.0–34.0)
MCHC: 33.2 g/dL (ref 30.0–36.0)
MCV: 90.2 fL (ref 78.0–100.0)
PLATELETS: 228 10*3/uL (ref 150–400)
RBC: 4.4 MIL/uL (ref 4.22–5.81)
RDW: 12.6 % (ref 11.5–15.5)
WBC: 7 10*3/uL (ref 4.0–10.5)

## 2018-01-27 LAB — URINE CULTURE
CULTURE: NO GROWTH
Culture: >=100000 — AB
Special Requests: NORMAL

## 2018-01-27 LAB — TSH: TSH: 4.307 u[IU]/mL (ref 0.350–4.500)

## 2018-01-27 LAB — OSMOLALITY: OSMOLALITY: 273 mosm/kg — AB (ref 275–295)

## 2018-01-27 NOTE — Progress Notes (Signed)
Was the fall witnessed: no  Patient condition before and after the fall: confused  Patient's reaction to the fall: cursing  Name of the doctor that was notified including date and time: on call hospitalist, 01/27/18 @ 21:56  Any interventions and vital signs: 138/68, 98%, HR 106, temp 98

## 2018-01-27 NOTE — Progress Notes (Signed)
   01/27/18 2200  What Happened  Was fall witnessed? No  Was patient injured? No  Patient found on floor  Found by Staff-comment  Stated prior activity to/from bed, chair, or stretcher  Follow Up  MD notified Schorr  Time MD notified 2159  Family notified Yes-comment  Time family notified 2256

## 2018-01-27 NOTE — Care Management CC44 (Signed)
Condition Code 44 Documentation Completed  Patient Details  Name: Thomas [name]. MRN: [id number] Date of Birth: October 25, 1940   Condition Code 44 given:  Yes Patient signature on Condition Code 44 notice:  Yes Documentation of 2 MD's agreement:  Yes Code 44 added to claim:  Yes    Glennon Mac, RN 01/27/2018, 4:23 PM

## 2018-01-27 NOTE — Care Management Obs Status (Signed)
MEDICARE OBSERVATION STATUS NOTIFICATION   Patient Details  Name: Thomas [name]. MRN: [id number] Date of Birth: 1940-09-06   Medicare Observation Status Notification Given:  Yes    Glennon Mac, RN 01/27/2018, 4:23 PM

## 2018-01-27 NOTE — Progress Notes (Addendum)
Thomas [name] [id number] Admission Data: 01/27/2018 1:47 AM Attending Provider: Elder Love, MD  SHN:[id number], Thomas Keen, NP Consults/ Treatment Team:    Thomas [name]. is a 77 y.o. male patient admitted from ED awake, alert  & orientated  X 4,  DNR, VSS - Blood pressure (!) 174/87, pulse 87, temperature (!) 96.8 F (36 C), resp. rate 12, height 5\' 11"  (1.803 m), weight 71.8 kg (158 lb 4.6 oz), SpO2 98 %., O2  Room air, no c/o shortness of breath, no c/o chest pain, no distress noted. Given 50mg  metoprolol.   IV site WDL: Located in [facility] with a transparent dsg that's clean dry and intact.  Allergies:   Allergies  Allergen Reactions  . Sulfonamide Derivatives Other (See Comments)    REACTION: Weak,lethargic     Past Medical History:  Diagnosis Date  . CHF (congestive heart failure) (HCC) 1999  . CKD (chronic kidney disease), stage II   . Coronary artery disease   . Hx of colonic polyps   . Hyperlipidemia   . Hyperplastic colon polyp 04/15/2007  . Hypertension   . Prostatic hypertrophy, benign    with elevated PSA  . Stroke (HCC)   . TIA (transient ischemic attack) 2003   "mini stroke" (03/19/2013)    History:  obtained from Chart. Patient wanted to sleep and declined admissions assessment.  Pt orientation to unit, room and routine. Information packet given to patient/family and safety video watched.  Admission INP armband ID verified with patient/family, and in place. Fall risk assessment complete with Patient and family verbalizing understanding of risks associated with falls. Pt verbalizes an understanding of how to use the call bell and to call for help before getting out of bed.  Skin, clean-dry- intact. Evidence of bruising located on arms bilaterally, right foot, left leg, and right buttock. Rash located in groin area. No skin tears noted. No evidence of skin break down noted on exam. Safety sitter in room for intermittent outbursts. Will cont to monitor and assist  as needed.  Thomas Heap, RN 01/27/2018 1:47 AM

## 2018-01-27 NOTE — Progress Notes (Signed)
PROGRESS NOTE    Thomas [name].  ZOX:[id number] DOB: 21-May-1941 DOA: 01/26/2018 PCP: Shirline Frees, NP   Brief Narrative: Patient is a 77 year old male with past medical history of coronary artery disease, status post CABG, hypertension, stroke with residual left-sided weakness, vascular dementia with behavioral disturbances who presented to the emergency department after he was brought by his son because of confusion.  Urine tract infection was suspected on presentation.  Patient was also found to have hyponatremia.  Admitted for management of acute metabolic encephalopathy.Started on antibiotics.  Assessment & Plan:   Active Problems:   Delirium  Altered mental status: Could be multifactorial. .  He presented with hyponatremia.  Cud also be associated with urine tract infection.  Altered mental status has resolved.  Currently he is alert and oriented x3.  Hyponatremia: Improved with IV fluids.  We will continue to monitor electrolytes.  Urinary retention with BPH: Continue tamsulosin.  Will discontinue the Foley and check if he voids by himself.  Possible UTI: UA suggestive of urinary tract infection.  She has history of urinary tract infection in the past.  History of multidrug-resistant organism but susceptible to Cipro.We will follow up Urine culture.  History of CVA: With residual left-sided weakness.  Continue dual antiplatelet therapy.Plan is to was told he should stop ASA at end of June 2019.Continue statin.  Vascular dementia with behavioral disturbance was also suspected.  Will request for physical therapy evaluation.   DVT prophylaxis: Lovenox Code Status: DNR Family Communication:None present at the bedside Disposition Plan: Home versus skilled nursing facility after physical therapy evaluation   Consultants: None  Procedures:None  Antimicrobials: Ciprofloxacin  Subjective: Patient seen and examined at the bed side.  Remains.comfortable.  Denies any  complaints.  Currently alert and oriented.  Objective: Vitals:   01/27/18 0003 01/27/18 0459 01/27/18 0854 01/27/18 1200  BP: (!) 174/87 (!) 157/85 (!) 150/79 130/75  Pulse: 87 65 72 60  Resp: 12 12 16 16   Temp: (!) 96.8 F (36 C) (!) 97.4 F (36.3 C) 98.2 F (36.8 C) 98 F (36.7 C)  TempSrc:   Oral Oral  SpO2: 98% 100% 98% 97%  Weight: 71.8 kg (158 lb 4.6 oz)     Height: 5\' 11"  (1.803 m)       Intake/Output Summary (Last 24 hours) at 01/27/2018 1406 Last data filed at 01/27/2018 0855 Gross per 24 hour  Intake 640 ml  Output 2810 ml  Net -2170 ml   Filed Weights   01/26/18 1855 01/27/18 0003  Weight: 78.5 kg (173 lb) 71.8 kg (158 lb 4.6 oz)    Examination:  General exam: Appears calm and comfortable ,Not in distress,average built HEENT:PERRL,Oral mucosa moist, Ear/Nose normal on gross exam Respiratory system: Bilateral equal air entry, normal vesicular breath sounds, no wheezes or crackles  Cardiovascular system: S1 & S2 heard, RRR. No JVD, murmurs, rubs, gallops or clicks. No pedal edema. Gastrointestinal system: Abdomen is nondistended, soft and nontender. No organomegaly or masses felt. Normal bowel sounds heard. Central nervous system: Alert and oriented.  Residual left-sided weakness. Extremities: No edema, no clubbing ,no cyanosis, distal peripheral pulses palpable. Skin: No rashes, lesions or ulcers,no icterus ,no pallor    Data Reviewed: I have personally reviewed following labs and imaging studies  CBC: Recent Labs  Lab 01/26/18 1946 01/27/18 0023  WBC 7.7 7.0  NEUTROABS 5.1  --   HGB 12.2* 13.2  HCT 36.1* 39.7  MCV 89.6 90.2  PLT 213 228   Basic  Metabolic Panel: Recent Labs  Lab 01/26/18 1946 01/27/18 0023  NA 124* 131*  K 4.2 4.4  CL 93* 96*  CO2 22 25  GLUCOSE 112* 93  BUN 9 6  CREATININE 1.00 0.98  CALCIUM 8.0* 8.4*   GFR: Estimated Creatinine Clearance: 65.1 mL/min (by C-G formula based on SCr of 0.98 mg/dL). Liver Function  Tests: Recent Labs  Lab 01/26/18 1946  AST 19  ALT 6*  ALKPHOS 66  BILITOT 0.9  PROT 6.2*  ALBUMIN 3.2*   No results for input(s): LIPASE, AMYLASE in the last 168 hours. No results for input(s): AMMONIA in the last 168 hours. Coagulation Profile: No results for input(s): INR, PROTIME in the last 168 hours. Cardiac Enzymes: No results for input(s): CKTOTAL, CKMB, CKMBINDEX, TROPONINI in the last 168 hours. BNP (last 3 results) No results for input(s): PROBNP in the last 8760 hours. HbA1C: No results for input(s): HGBA1C in the last 72 hours. CBG: No results for input(s): GLUCAP in the last 168 hours. Lipid Profile: No results for input(s): CHOL, HDL, LDLCALC, TRIG, CHOLHDL, LDLDIRECT in the last 72 hours. Thyroid Function Tests: Recent Labs    01/27/18 0023  TSH 4.307   Anemia Panel: No results for input(s): VITAMINB12, FOLATE, FERRITIN, TIBC, IRON, RETICCTPCT in the last 72 hours. Sepsis Labs: No results for input(s): PROCALCITON, LATICACIDVEN in the last 168 hours.  Recent Results (from the past 240 hour(s))  Culture, Urine     Status: Abnormal   Collection Time: 01/25/18  2:10 PM  Result Value Ref Range Status   Specimen Description   Final    URINE, RANDOM Performed at [facility], [address] [place], [place] [address]    Special Requests   Final    NONE Performed at [facility], [address], [place], [place] [address]    Culture >=100,000 COLONIES/mL CITROBACTER BRAAKII (A)  Final   Report Status 01/27/2018 FINAL  Final   Organism ID, Bacteria CITROBACTER BRAAKII (A)  Final      Susceptibility   Citrobacter braakii - MIC*    CEFAZOLIN >=64 RESISTANT Resistant     CEFTRIAXONE 16 INTERMEDIATE Intermediate     CIPROFLOXACIN 0.5 SENSITIVE Sensitive     GENTAMICIN <=1 SENSITIVE Sensitive     IMIPENEM <=0.25 SENSITIVE Sensitive     NITROFURANTOIN <=16 SENSITIVE Sensitive     TRIMETH/SULFA <=20 SENSITIVE Sensitive      PIP/TAZO 64 INTERMEDIATE Intermediate     * >=100,000 COLONIES/mL CITROBACTER BRAAKII         Radiology Studies: Dg Chest 2 View  Result Date: 01/26/2018 CLINICAL DATA:  Confusion with altered mental status EXAM: CHEST - 2 VIEW COMPARISON:  02/16/2014 FINDINGS: Post sternotomy changes. Left chest recording device. No significant effusion. Cardiomegaly with mild central vascular congestion. Aortic atherosclerosis. Atelectasis at the left base. No pneumothorax. IMPRESSION: 1. Mild cardiomegaly with minimal central vascular congestion. 2. Streaky atelectasis at the left base. Electronically Signed   By: Jasmine Pang M.D.   On: 01/26/2018 21:19   Ct Head Wo Contrast  Result Date: 01/26/2018 CLINICAL DATA:  77 y/o M; increased confusion. History of vascular dementia. EXAM: CT HEAD WITHOUT CONTRAST TECHNIQUE: Contiguous axial images were obtained from the base of the skull through the vertex without intravenous contrast. COMPARISON:  11/04/2017 MRI of the head, CT of the head, CT perfusion of the head, CTA of the head. FINDINGS: Brain: Chronic infarct of the right medial temporal lobe and occipital lobe. Confluent  hypoattenuation in white matter is compatible with advanced chronic microvascular ischemic changes and stable from the prior study. There multiple chronic lacunar infarcts within the basal ganglia bilaterally and the pons. Stable brain parenchymal volume loss. Vascular: Calcific atherosclerosis of carotid siphons and vertebral arteries. Skull: Normal. Negative for fracture or focal lesion. Sinuses/Orbits: Mild mucosal thickening of the ethmoid air cells. Normal aeration of mastoid air cells. Bilateral intra-ocular lens replacement. Other: None. IMPRESSION: 1. No acute intracranial abnormality identified. 2. Stable advanced chronic microvascular ischemic changes and parenchymal volume loss of the brain. Multiple stable chronic 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  distribution infarct. Electronically Signed   By: Mitzi Hansen M.D.   On: 01/26/2018 20:27        Scheduled Meds: . aspirin  325 mg Oral Daily  . atorvastatin  80 mg Oral q1800  . ciprofloxacin  500 mg Oral BID  . clopidogrel  75 mg Oral Q breakfast  . enoxaparin (LOVENOX) injection  40 mg Subcutaneous Q24H  . folic acid  1 mg Oral Daily  . hydrocerin  1 application Topical BID  . metoprolol tartrate  50 mg Oral BID  . sodium chloride flush  3 mL Intravenous Q12H  . tamsulosin  0.4 mg Oral QPC supper   Continuous Infusions:   LOS: 1 day    Time spent: 35 mins.More than 50% of that time was spent in counseling and/or coordination of care.      Burnadette Pop, MD [facility] Pager [phone]  If 7PM-7AM, please contact night-coverage [email or web address] Password [email or web address] 01/27/2018, 2:06 PM

## 2018-01-27 NOTE — Progress Notes (Signed)
Sitter d/c'd. Bed alarm set, wife at bedside at this time. Pt oriented to person, place, situation and somewhat to time. Verbalizes understanding of call bell use.  Foley d/c'd per verbal order from MD Adhikari. PT tolerated removal well. Instructed pt carefully on calling for assistance and use of urinal. Pt verbalized understanding.

## 2018-01-27 NOTE — [place] FL2 (Signed)
[place] MEDICAID FL2 LEVEL OF CARE SCREENING TOOL     IDENTIFICATION  Patient Name: Thomas [name]. Birthdate: January 11, 1941 Sex: male Admission Date (Current Location): 01/26/2018  [facility] and [place] Number:  Producer, television/film/video and Address:  [facility], [address] [place], [place] [address]      Provider Number: [id number]  Attending Physician Name and Address:  Burnadette Pop, MD  Relative Name and Phone Number:  [name], spouse, [phone]    Current Level of Care: Hospital Recommended Level of Care: Skilled Nursing Facility Prior Approval Number:    Date Approved/Denied:   PASRR Number: [id number]  Discharge Plan: SNF    Current Diagnoses: Patient Active Problem List   Diagnosis Date Noted  . Delirium 01/26/2018  . 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 Foul smelling urine   . Leukocytosis   . Hyponatremia   . 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 Benign essential HTN   . Coronary artery disease involving native coronary artery of native heart without angina pectoris   . Stage 3 chronic kidney disease (HCC)   . Acute lower UTI   . Seasonal allergies   . Stroke (HCC) 11/04/2017  . HLD (hyperlipidemia) 11/04/2017  . Chronic diastolic CHF (congestive heart failure) (HCC) 11/04/2017  . Acute renal failure superimposed on stage 2 chronic kidney disease (HCC) 11/04/2017  . Acute metabolic encephalopathy 11/04/2017  . UTI (urinary tract infection) 11/04/2017  . AKI (acute kidney injury) (HCC)   . History of stroke   . Prediabetes   . Left hemiparesis (HCC) 03/19/2013  . Cerebral infarction (HCC) 03/19/2013  . Left leg weakness 03/19/2013  . CAD, ARTERY BYPASS GRAFT 09/30/2008  . 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 Borderline hyperglycemia 12/03/2007  . Other and unspecified hyperlipidemia 07/28/2007  . Essential hypertension 07/28/2007  . 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 Coronary  atherosclerosis 07/28/2007  . BENIGN PROSTATIC HYPERTROPHY 07/28/2007  . COLONIC POLYPS, 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    Orientation RESPIRATION BLADDER Height & Weight     Self, Situation, Place  Normal Continent, Indwelling catheter Weight: 71.8 kg (158 lb 4.6 oz) Height:  5\' 11"  (180.3 cm)  BEHAVIORAL SYMPTOMS/MOOD NEUROLOGICAL BOWEL NUTRITION STATUS      Continent Diet(Please see DC Summary)  AMBULATORY STATUS COMMUNICATION OF NEEDS Skin   Limited Assist Verbally Normal                       Personal Care Assistance Level of Assistance  Bathing, Feeding, Dressing Bathing Assistance: Limited assistance Feeding assistance: Limited assistance Dressing Assistance: Limited assistance     Functional Limitations Info  Hearing   Hearing Info: Impaired      SPECIAL CARE FACTORS FREQUENCY  PT (By licensed PT)     PT Frequency: 5x/week              Contractures      Additional Factors Info  Code Status, Allergies Code Status Info: DNR Allergies Info: Sulfonamide Derivatives           Current Medications (01/27/2018):  This is the current hospital active medication list Current Facility-Administered Medications  Medication Dose Route Frequency Provider Last Rate Last Dose  . acetaminophen (TYLENOL) tablet 325-650 mg  325-650 mg Oral Q4H PRN Elder Love, MD      . aspirin EC tablet 325 mg  325 mg Oral Daily Elder Love, MD   325 mg at 01/27/18 0942  .  atorvastatin (LIPITOR) tablet 80 mg  80 mg Oral q1800 Elder Love, MD   80 mg at 01/27/18 1622  . ciprofloxacin (CIPRO) tablet 500 mg  500 mg Oral BID Elder Love, MD   500 mg at 01/27/18 0941  . clopidogrel (PLAVIX) tablet 75 mg  75 mg Oral Q breakfast Elder Love, MD   75 mg at 01/27/18 1622  . enoxaparin (LOVENOX) injection 40 mg  40 mg Subcutaneous Q24H Elder Love, MD   40 mg at 01/27/18 0942  . folic acid (FOLVITE) tablet 1 mg  1 mg Oral Daily Elder Love, MD   1 mg at  01/27/18 0941  . hydrocerin (EUCERIN) cream 1 application  1 application Topical BID Burnadette Pop, MD   1 application at 01/27/18 1622  . metoprolol tartrate (LOPRESSOR) tablet 50 mg  50 mg Oral BID Elder Love, MD   50 mg at 01/27/18 0941  . sodium chloride flush (NS) 0.9 % injection 3 mL  3 mL Intravenous Q12H Elder Love, MD   3 mL at 01/27/18 0943  . tamsulosin (FLOMAX) capsule 0.4 mg  0.4 mg Oral QPC supper Elder Love, MD   0.4 mg at 01/27/18 0016     Discharge Medications: Please see discharge summary for a list of discharge medications.  Relevant Imaging Results:  Relevant Lab Results:   Additional Information SSN: [id number] [place], [place]

## 2018-01-28 ENCOUNTER — Encounter: Payer: Self-pay | Admitting: Adult Health

## 2018-01-28 DIAGNOSIS — R410 Disorientation, unspecified: Secondary | ICD-10-CM | POA: Diagnosis not present

## 2018-01-28 NOTE — Progress Notes (Signed)
Pt is currently awaiting transport to [facility].  Pt's spouse is at bedside.  Pt is resting in bed with eyes closed.  This nurse explained to the pt's spouse that he would complete his physical assessment and give Mr. Laconte his nighttime meds.  Pt's spouse stated that she did not want Mr. Cavanah to be disturbed until transport had arrived to pick him up.  Will continue to monitor.

## 2018-01-28 NOTE — Discharge Summary (Signed)
Physician Discharge Summary  Thomas [name]. OZH:[id number] DOB: 1940-09-03 DOA: 01/26/2018  PCP: Shirline Frees, NP  Admit date: 01/26/2018 Discharge date: 01/28/2018  Admitted From: Home Disposition:  SNF  Discharge Condition:Stable CODE STATUS: DNR Diet recommendation: Heart Healthy  Brief/Interim Summary: Patient is a 77 year old male with past medical history of coronary artery disease, status post CABG, hypertension, stroke with residual left-sided weakness, vascular dementia with behavioral disturbances who presented to the emergency department after he was brought by his son because of confusion.  Urine tract infection was suspected on presentation.  Patient was also found to have hyponatremia.  Admitted for management of acute metabolic encephalopathy.Started on antibiotics.  Following problems were addressed during his hospitalization:  Altered mental status: Could be multifactorial.  He presented with hyponatremia.    Could also be associated with his vascular dementia Altered mental status has resolved.  Currently he is alert and oriented x3.  Hyponatremia: Improved with IV fluids.    Urinary retention with BPH: Continue tamsulosin.    Discontinued Foley catheter and he is voiding by himself.  Possible UTI: UA suggestive of urinary tract infection.  He has history of urinary tract infection in the past.  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  History of CVA: With residual left-sided weakness.  Continue dual antiplatelet therapy.Plan is to  stop aspirin at end of June 2019 and continue Plavix thereafter.Continue statin. Physical therapy recommended skilled nursing facility on discharge.     Discharge Diagnoses:  Active Problems:   Delirium    Discharge Instructions  Discharge Instructions    Diet - low sodium heart healthy   Complete by:  As directed    Discharge  instructions   Complete by:  As directed    1) Take your medications as instructed. 2)Follow up with PCP in a week.Do CBC,BMP tests during the follow-up. 3) We currently recommend to stop taking Seroquel and Depakote.Follow up with your neurologist as an outpatient for the management of vascular dementia.   Increase activity slowly   Complete by:  As directed      Allergies as of 01/28/2018      Reactions   Sulfonamide Derivatives Other (See Comments)   REACTION: Weak,lethargic      Medication List    STOP taking these medications   ciprofloxacin 500 MG tablet Commonly known as:  CIPRO   divalproex 500 MG DR tablet Commonly known as:  DEPAKOTE   QUEtiapine 25 MG tablet Commonly known as:  SEROQUEL     TAKE these medications   acetaminophen 325 MG tablet Commonly known as:  TYLENOL Take 1-2 tablets (325-650 mg total) by mouth every 4 (four) hours as needed for mild pain.   aspirin 325 MG EC tablet Take 1 tablet (325 mg total) by mouth daily.   atorvastatin 80 MG tablet Commonly known as:  LIPITOR Take 1 tablet (80 mg total) by mouth daily. What changed:  when to take this   clopidogrel 75 MG tablet Commonly known as:  PLAVIX TAKE 1 TABLET BY MOUTH  DAILY WITH BREAKFAST What changed:    how much to take  how to take this  when to take this   Fish Oil 1000 MG Caps Take 1 capsule by mouth daily.   Flax Oil Take 1 capsule by mouth daily.   fluticasone 50 MCG/ACT nasal spray Commonly known as:  FLONASE Place 2 sprays into both nostrils daily as needed for  allergies or rhinitis.   folic acid 1 MG tablet Commonly known as:  FOLVITE TAKE 1 TABLET EVERY DAY What changed:    how much to take  how to take this  when to take this   hydrocerin Crea Apply 1 application topically 2 (two) times daily.   ketoconazole 2 % cream Commonly known as:  NIZORAL Apply 1 application topically 2 (two) times daily.   loratadine 10 MG tablet Commonly known as:   CLARITIN Take 10 mg by mouth at bedtime.   metoprolol tartrate 50 MG tablet Commonly known as:  LOPRESSOR TAKE 1 TABLET BY MOUTH TWO  TIMES DAILY What changed:    how much to take  how to take this  when to take this   tamsulosin 0.4 MG Caps capsule Commonly known as:  FLOMAX Take 1 capsule (0.4 mg total) by mouth daily after supper.      Follow-up Information    Shirline Frees, NP. Schedule an appointment as soon as possible for a visit in 1 week(s).   Specialty:  Family Medicine Contact information: [address] [place] [address] [phone]          Allergies  Allergen Reactions  . Sulfonamide Derivatives Other (See Comments)    REACTION: Weak,lethargic    Consultations:  None   Procedures/Studies: Dg Chest 2 View  Result Date: 01/26/2018 CLINICAL DATA:  Confusion with altered mental status EXAM: CHEST - 2 VIEW COMPARISON:  02/16/2014 FINDINGS: Post sternotomy changes. Left chest recording device. No significant effusion. Cardiomegaly with mild central vascular congestion. Aortic atherosclerosis. Atelectasis at the left base. No pneumothorax. IMPRESSION: 1. Mild cardiomegaly with minimal central vascular congestion. 2. Streaky atelectasis at the left base. Electronically Signed   By: Jasmine Pang M.D.   On: 01/26/2018 21:19   Ct Head Wo Contrast  Result Date: 01/26/2018 CLINICAL DATA:  77 y/o M; increased confusion. History of vascular dementia. EXAM: CT HEAD WITHOUT CONTRAST TECHNIQUE: Contiguous axial images were obtained from the base of the skull through the vertex without intravenous contrast. COMPARISON:  11/04/2017 MRI of the head, CT of the head, CT perfusion of the head, CTA of the head. FINDINGS: Brain: Chronic infarct of the right medial temporal lobe and occipital lobe. Confluent hypoattenuation in white matter is compatible with advanced chronic microvascular ischemic changes and stable from the prior study. There multiple chronic  lacunar infarcts within the basal ganglia bilaterally and the pons. Stable brain parenchymal volume loss. Vascular: Calcific atherosclerosis of carotid siphons and vertebral arteries. Skull: Normal. Negative for fracture or focal lesion. Sinuses/Orbits: Mild mucosal thickening of the ethmoid air cells. Normal aeration of mastoid air cells. Bilateral intra-ocular lens replacement. Other: None. IMPRESSION: 1. No acute intracranial abnormality identified. 2. Stable advanced chronic microvascular ischemic changes and parenchymal volume loss of the brain. Multiple stable chronic 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 distribution infarct. Electronically Signed   By: Mitzi Hansen M.D.   On: 01/26/2018 20:27       Subjective: Patient seen and examined the bedside this morning.  Remains comfortable.  Alert and oriented.  Wife present at the bedside.  Stable for discharge to skilled nursing facility today.  Discharge Exam: Vitals:   01/28/18 1014 01/28/18 1021  BP: 139/71 139/71  Pulse:  89  Resp:    Temp:    SpO2:     Vitals:   01/28/18 0552 01/28/18 0802 01/28/18 1014 01/28/18 1021  BP: 136/70 (!) 158/89 139/71 139/71  Pulse: 100 89  89  Resp: 18 14    Temp: 98.2 F (36.8 C) 98.9 F (37.2 C)    TempSrc:  Oral    SpO2: 98% 95%    Weight:      Height:        General: Pt is alert, awake, not in acute distress Cardiovascular: RRR, S1/S2 +, no rubs, no gallops Respiratory: CTA bilaterally, no wheezing, no rhonchi Abdominal: Soft, NT, ND, bowel sounds + Extremities: no edema, no cyanosis    The results of significant diagnostics from this hospitalization (including imaging, microbiology, ancillary and laboratory) are listed below for reference.     Microbiology: Recent Results (from the past 240 hour(s))  Culture, Urine     Status: Abnormal   Collection Time: 01/25/18  2:10 PM  Result Value Ref Range Status   Specimen Description   Final     URINE, RANDOM Performed at [facility], [address], [place], [place] [address]    Special Requests   Final    NONE Performed at [facility], [address], [place], [place] [address]    Culture >=100,000 COLONIES/mL CITROBACTER BRAAKII (A)  Final   Report Status 01/27/2018 FINAL  Final   Organism ID, Bacteria CITROBACTER BRAAKII (A)  Final      Susceptibility   Citrobacter braakii - MIC*    CEFAZOLIN >=64 RESISTANT Resistant     CEFTRIAXONE 16 INTERMEDIATE Intermediate     CIPROFLOXACIN 0.5 SENSITIVE Sensitive     GENTAMICIN <=1 SENSITIVE Sensitive     IMIPENEM <=0.25 SENSITIVE Sensitive     NITROFURANTOIN <=16 SENSITIVE Sensitive     TRIMETH/SULFA <=20 SENSITIVE Sensitive     PIP/TAZO 64 INTERMEDIATE Intermediate     * >=100,000 COLONIES/mL CITROBACTER BRAAKII  Urine culture     Status: None   Collection Time: 01/26/18  7:46 PM  Result Value Ref Range Status   Specimen Description URINE, CATHETERIZED  Final   Special Requests Normal  Final   Culture   Final    NO GROWTH Performed at [facility], [address], [place], [place] [address]    Report Status 01/27/2018 FINAL  Final     Labs: BNP (last 3 results) Recent Labs    11/04/17 0539  BNP 202.7*   Basic Metabolic Panel: Recent Labs  Lab 01/26/18 1946 01/27/18 0023  NA 124* 131*  K 4.2 4.4  CL 93* 96*  CO2 22 25  GLUCOSE 112* 93  BUN 9 6  CREATININE 1.00 0.98  CALCIUM 8.0* 8.4*   Liver Function Tests: Recent Labs  Lab 01/26/18 1946  AST 19  ALT 6*  ALKPHOS 66  BILITOT 0.9  PROT 6.2*  ALBUMIN 3.2*   No results for input(s): LIPASE, AMYLASE in the last 168 hours. No results for input(s): AMMONIA in the last 168 hours. CBC: Recent Labs  Lab 01/26/18 1946 01/27/18 0023  WBC 7.7 7.0  NEUTROABS 5.1  --   HGB 12.2* 13.2  HCT 36.1* 39.7  MCV 89.6 90.2  PLT 213 228   Cardiac Enzymes: No results for input(s): CKTOTAL, CKMB, CKMBINDEX,  TROPONINI in the last 168 hours. BNP: Invalid input(s): POCBNP CBG: No results for input(s): GLUCAP in the last 168 hours. D-Dimer No results for input(s): DDIMER in the last 72 hours. Hgb A1c No results for input(s): HGBA1C in the last 72 hours. Lipid Profile No results for input(s): CHOL, HDL, LDLCALC, TRIG, CHOLHDL, LDLDIRECT in the last  72 hours. Thyroid function studies Recent Labs    01/27/18 0023  TSH 4.307   Anemia work up No results for input(s): VITAMINB12, FOLATE, FERRITIN, TIBC, IRON, RETICCTPCT in the last 72 hours. Urinalysis    Component Value Date/Time   COLORURINE STRAW (A) 01/26/2018 1946   APPEARANCEUR CLEAR 01/26/2018 1946   LABSPEC 1.004 (L) 01/26/2018 1946   PHURINE 8.0 01/26/2018 1946   GLUCOSEU NEGATIVE 01/26/2018 1946   HGBUR LARGE (A) 01/26/2018 1946   BILIRUBINUR NEGATIVE 01/26/2018 1946   BILIRUBINUR - 01/25/2018 1026   KETONESUR NEGATIVE 01/26/2018 1946   PROTEINUR NEGATIVE 01/26/2018 1946   UROBILINOGEN negative (A) 01/25/2018 1026   UROBILINOGEN 1.0 03/19/2013 1142   NITRITE NEGATIVE 01/26/2018 1946   LEUKOCYTESUR LARGE (A) 01/26/2018 1946   Sepsis Labs Invalid input(s): PROCALCITONIN,  WBC,  LACTICIDVEN Microbiology Recent Results (from the past 240 hour(s))  Culture, Urine     Status: Abnormal   Collection Time: 01/25/18  2:10 PM  Result Value Ref Range Status   Specimen Description   Final    URINE, RANDOM Performed at [facility], [address] [place], [place] [address]    Special Requests   Final    NONE Performed at [facility], [address] [place], [place] [address]    Culture >=100,000 COLONIES/mL CITROBACTER BRAAKII (A)  Final   Report Status 01/27/2018 FINAL  Final   Organism ID, Bacteria CITROBACTER BRAAKII (A)  Final      Susceptibility   Citrobacter braakii - MIC*    CEFAZOLIN >=64 RESISTANT Resistant     CEFTRIAXONE 16 INTERMEDIATE Intermediate     CIPROFLOXACIN 0.5  SENSITIVE Sensitive     GENTAMICIN <=1 SENSITIVE Sensitive     IMIPENEM <=0.25 SENSITIVE Sensitive     NITROFURANTOIN <=16 SENSITIVE Sensitive     TRIMETH/SULFA <=20 SENSITIVE Sensitive     PIP/TAZO 64 INTERMEDIATE Intermediate     * >=100,000 COLONIES/mL CITROBACTER BRAAKII  Urine culture     Status: None   Collection Time: 01/26/18  7:46 PM  Result Value Ref Range Status   Specimen Description URINE, CATHETERIZED  Final   Special Requests Normal  Final   Culture   Final    NO GROWTH Performed at [facility], [address] [place], [place] [address]    Report Status 01/27/2018 FINAL  Final    Please note: You were cared for by a hospitalist during your hospital stay. Once you are discharged, your primary care physician will handle any further medical issues. Please note that NO REFILLS for any discharge medications will be authorized once you are discharged, as it is imperative that you return to your primary care physician (or establish a relationship with a primary care physician if you do not have one) for your post hospital discharge needs so that they can reassess your need for medications and monitor your lab values.    Time coordinating discharge: 40 minutes  SIGNED:   Burnadette Pop, MD  [facility] 01/28/2018, 11:07 AM Pager [phone]  If 7PM-7AM, please contact night-coverage [email or web address] Password [email or web address]

## 2018-01-28 NOTE — Progress Notes (Signed)
Patient will DC to: [facility] Anticipated DC date: 01/28/18 Family notified: Spouse, Scientist, physiological by: Delon Sacramento   Per MD patient ready for DC to [facility]. RN, patient, patient's family, and facility notified of DC. Discharge Summary sent to facility. RN given number for report [phone]). DC packet on chart. Ambulance transport requested for patient.   CSW signing off.  Cristobal Goldmann, LCSW Clinical Social Worker [phone]

## 2018-01-28 NOTE — Progress Notes (Signed)
Thomas [name]. Thomas [name] was discharged via stretcher with PTAR.  All personal belongings were accounted for.  The patient's wife is at the bedside, and stated that she will meet the patient at [facility].  Neither patient nor spouse have any other questions.

## 2018-01-28 NOTE — Progress Notes (Signed)
On call MD text paged about the patient complaining of not being able to void. Bladder scanned pt and pt has of fluid in his bladder. Waiting for MD to respond to page.

## 2018-01-28 NOTE — Evaluation (Signed)
Physical Therapy Evaluation Patient Details Name: Thomas [name]. MRN: [id number] DOB: 12-Mar-1941 Today's Date: 01/28/2018   History of Present Illness  77 year old man with medical problems including coronary artery disease status post CABG in 1999, hypertension, prior stroke with residual left hemibody weakness as well as suspected vascular dementia with behavioral disturbances. Admitted with suspected UTI after 2-3 weeks of functional decline and worsening mental status  Clinical Impression  Pt presents with impaired gait, balance, mobility and safety awareness with history of repeated falls at home. Pt will benefit from skilled acute PT services to lessen burden of care for safe d/c to SNF.    Follow Up Recommendations SNF    Equipment Recommendations  None recommended by PT    Recommendations for Other Services       Precautions / Restrictions Precautions Precautions: Fall Restrictions Weight Bearing Restrictions: No      Mobility  Bed Mobility Overal bed mobility: Needs Assistance Bed Mobility: Supine to Sit;Sit to Supine     Supine to sit: Supervision Sit to supine: Supervision   General bed mobility comments: pt requires increased time and use of bed rails for supine <> sit  Transfers Overall transfer level: Needs assistance Equipment used: Rolling walker (2 wheeled) Transfers: Sit to/from Stand Sit to Stand: Mod assist         General transfer comment: cues for UE placement and safety, lifting and lowering assist for safety  Ambulation/Gait Ambulation/Gait assistance: Mod assist Gait Distance (Feet): 10 Feet Assistive device: Rolling walker (2 wheeled)       General Gait Details: pt able to take steps fwd and backward with RW 10', pt requires mod A for balance due to posterior lean, pt initially dragging Rt LE but able to lift Rt LE when cued, pt with wide BOS, cues to keep RW close  Stairs            Wheelchair Mobility    Modified  Rankin (Stroke Patients Only)       Balance Overall balance assessment: Needs assistance   Sitting balance-Leahy Scale: Poor Sitting balance - Comments: seated LAQ requires min A for balance due to posterior lean Postural control: Posterior lean Standing balance support: Bilateral upper extremity supported Standing balance-Leahy Scale: Zero Standing balance comment: posterior lean                             Pertinent Vitals/Pain Pain Assessment: No/denies pain    Home Living Family/patient expects to be discharged to:: Skilled nursing facility                      Prior Function Level of Independence: Needs assistance   Gait / Transfers Assistance Needed: needs assistance for gait with RW, wife reports pt w/c level for the past 2-3 weeks     Comments: frequent almost daily falls     Hand Dominance        Extremity/Trunk Assessment   Upper Extremity Assessment Upper Extremity Assessment: Generalized weakness    Lower Extremity Assessment Lower Extremity Assessment: Generalized weakness    Cervical / Trunk Assessment Cervical / Trunk Assessment: Kyphotic  Communication   Communication: No difficulties  Cognition   Behavior During Therapy: Agitated Overall Cognitive Status: Impaired/Different from baseline Area of Impairment: Memory;Safety/judgement;Problem solving;Awareness                         Safety/Judgement: Decreased  awareness of safety;Decreased awareness of deficits Awareness: Emergent Problem Solving: Slow processing;Difficulty sequencing;Requires verbal cues;Requires tactile cues General Comments: pt's wife reports impaired awareness, safety, memory      General Comments      Exercises     Assessment/Plan    PT Assessment Patient needs continued PT services  PT Problem List Decreased strength;Decreased mobility;Decreased coordination;Decreased activity tolerance;Decreased balance;Decreased knowledge of use  of DME;Decreased safety awareness       PT Treatment Interventions DME instruction;Therapeutic activities;Cognitive remediation;Modalities;Gait training;Therapeutic exercise;Patient/family education;Balance training;Wheelchair mobility training;Functional mobility training;Neuromuscular re-education    PT Goals (Current goals can be found in the Care Plan section)  Acute Rehab PT Goals Patient Stated Goal: none stated PT Goal Formulation: With family Time For Goal Achievement: 02/11/18 Potential to Achieve Goals: Good    Frequency Min 3X/week   Barriers to discharge Decreased caregiver support      Co-evaluation               AM-PAC PT "6 Clicks" Daily Activity  Outcome Measure Difficulty turning over in bed (including adjusting bedclothes, sheets and blankets)?: A Little Difficulty moving from lying on back to sitting on the side of the bed? : A Little Difficulty sitting down on and standing up from a chair with arms (e.g., wheelchair, bedside commode, etc,.)?: A Lot Help needed moving to and from a bed to chair (including a wheelchair)?: A Lot Help needed walking in hospital room?: A Lot Help needed climbing 3-5 steps with a railing? : Total 6 Click Score: 13    End of Session   Activity Tolerance: Patient tolerated treatment well Patient left: in bed;with bed alarm set;with call bell/phone within reach;with family/visitor present Nurse Communication: Mobility status PT Visit Diagnosis: Unsteadiness on feet (R26.81);Repeated falls (R29.6);Muscle weakness (generalized) (M62.81);Difficulty in walking, not elsewhere classified (R26.2);History of falling (Z91.81)    Time: 1610-9604 PT Time Calculation (min) (ACUTE ONLY): 23 min   Charges:   PT Evaluation $PT Eval Moderate Complexity: 1 Mod PT Treatments $Therapeutic Activity: 8-22 mins   PT G Codes:        Reggy Eye, PT, DPT  Ayden Hardwick 01/28/2018, 10:17 AM

## 2018-01-28 NOTE — Clinical Social Work Note (Addendum)
Clinical Social Work Assessment  Patient Details  Name: Thomas [name]. MRN: [id number] Date of Birth: 1941-01-13  Date of referral:  01/28/18               Reason for consult:  Facility Placement                Permission sought to share information with:  Facility Medical sales representative, Family Supports Permission granted to share information::  Yes, Verbal Permission Granted  Name::     Buyer, retail::  SNFs  Relationship::  Spouse  Contact Information:  [phone]  Housing/Transportation Living arrangements for the past 2 months:  Single Family Home Source of Information:  Spouse Patient Interpreter Needed:  None Criminal Activity/Legal Involvement Pertinent to Current Situation/Hospitalization:  No - Comment as needed Significant Relationships:  Adult Children, Spouse Lives with:  Spouse Do you feel safe going back to the place where you live?  No Need for family participation in patient care:  Yes (Comment)  Care giving concerns:  CSW received consult for possible SNF placement at time of discharge. CSW spoke with patient and spouse regarding PT recommendation of SNF placement at time of discharge. Patient is both confused and oriented at times. Patient's spouse reported that patient's spouse is currently unable to care for patient at their home given patient's current physical needs and fall risk. Patient expressed understanding of PT recommendation and is agreeable to SNF placement at time of discharge. CSW to continue to follow and assist with discharge planning needs.   Social Worker assessment / plan:  CSW spoke with patient and spouse concerning possibility of rehab at [facility] before returning home.  Employment status:  Retired Database administrator PT Recommendations:  Skilled Nursing Facility Information / Referral to community resources:  Skilled Nursing Facility  Patient/Family's Response to care:  Patient recognizes need for rehab before  returning home and is agreeable to a SNF in [place]. Patient's spouse reported preference for [facility]. She is aware that she needs to hire a private sitter to stay with patient per [facility] request.   Patient/Family's Understanding of and Emotional Response to Diagnosis, Current Treatment, and Prognosis:  Patient/family is realistic regarding therapy needs and expressed being hopeful for SNF placement. Patient's spouse expressed understanding of CSW role and discharge process as well as medical condition. No questions/concerns about plan or treatment.    Emotional Assessment Appearance:  Appears stated age Attitude/Demeanor/Rapport:  Other(Quiet) Affect (typically observed):  Accepting, Appropriate Orientation:  Oriented to Self, Oriented to Place, Oriented to Situation, Oriented to  Time Alcohol / Substance use:  Not Applicable Psych involvement (Current and /or in the community):  No (Comment)  Discharge Needs  Concerns to be addressed:  Care Coordination Readmission within the last 30 days:  No Current discharge risk:  None Barriers to Discharge:  No Barriers Identified   Mearl Latin, LCSWA 01/28/2018, 2:51 PM

## 2018-01-28 NOTE — Progress Notes (Signed)
I was notified by staff that patient was found down on the floor. No new injuries present on the patient. Vital signs are stable. Re-enforced to the patient the importance of not getting up by himself and using the call light. Will notify the patient's wife and MD and exchange current bed for a low bed.

## 2018-01-28 NOTE — Clinical Social Work Placement (Signed)
   CLINICAL SOCIAL WORK PLACEMENT  NOTE  Date:  01/28/2018  Patient Details  Name: Thomas [name]. MRN: [id number] Date of Birth: 12-Apr-1941  Clinical Social Work is seeking post-discharge placement for this patient at the Skilled  Nursing Facility level of care (*CSW will initial, date and re-position this form in  chart as items are completed):  Yes   Patient/family provided with [facility] Clinical Social Work Department's list of facilities offering this level of care within the geographic area requested by the patient (or if unable, by the patient's family).  Yes   Patient/family informed of their freedom to choose among providers that offer the needed level of care, that participate in Medicare, Medicaid or managed care program needed by the patient, have an available bed and are willing to accept the patient.  Yes   Patient/family informed of [facility]'s ownership interest in [facility] and [facility], as well as of the fact that they are under no obligation to receive care at these facilities.  PASRR submitted to EDS on 01/27/18     PASRR number received on 01/27/18     Existing PASRR number confirmed on       FL2 transmitted to all facilities in geographic area requested by pt/family on 01/27/18     FL2 transmitted to all facilities within larger geographic area on       Patient informed that his/her managed care company has contracts with or will negotiate with certain facilities, including the following:        Yes   Patient/family informed of bed offers received.  Patient chooses bed at [facility]     Physician recommends and patient chooses bed at      Patient to be transferred to [facility] on 01/28/18.  Patient to be transferred to facility by [facility]     Patient family notified on 01/28/18 of transfer.  Name of family member notified:  Spouse     PHYSICIAN       Additional Comment:     _______________________________________________ Mearl Latin, LCSWA 01/28/2018, 2:48 PM

## 2018-01-28 NOTE — Progress Notes (Signed)
Writer concurs with student nurse's assessment and was present during all medication passes this morning. Patient tolerated medications well and was alert and oriented x3. Wife in to visit, emotional support provided. Patient is currently resting in bed with no complaints.

## 2018-01-28 NOTE — Progress Notes (Signed)
Report given to [facility] where pt is heading. Awaiting [facility] transport. Patient and wife updated on plan. Pt denies pain, no needs voiced. Pt in low bed, bed alarm on.

## 2018-01-29 ENCOUNTER — Ambulatory Visit: Payer: Medicare Other | Admitting: Physical Medicine & Rehabilitation

## 2018-01-30 ENCOUNTER — Encounter: Payer: Medicare Other | Admitting: Speech Pathology

## 2018-02-03 ENCOUNTER — Other Ambulatory Visit: Payer: Self-pay | Admitting: Cardiovascular Disease

## 2018-02-03 ENCOUNTER — Encounter: Payer: Medicare Other | Admitting: Speech Pathology

## 2018-02-03 LAB — CUP PACEART REMOTE DEVICE CHECK
Date Time Interrogation Session: 20190528140553
MDC IDC PG IMPLANT DT: 20190326

## 2018-02-05 ENCOUNTER — Emergency Department (HOSPITAL_COMMUNITY)
Admission: EM | Admit: 2018-02-05 | Discharge: 2018-02-06 | Disposition: A | Discharge status: Home / Self Care | Payer: Medicare Other | Attending: Emergency Medicine | Admitting: Emergency Medicine

## 2018-02-05 ENCOUNTER — Encounter (HOSPITAL_COMMUNITY): Payer: Self-pay

## 2018-02-05 DIAGNOSIS — I5032 Chronic diastolic (congestive) heart failure: Secondary | ICD-10-CM | POA: Diagnosis not present

## 2018-02-05 DIAGNOSIS — Z87891 Personal history of nicotine dependence: Secondary | ICD-10-CM | POA: Diagnosis not present

## 2018-02-05 DIAGNOSIS — Z8673 Personal history of transient ischemic attack (TIA), and cerebral infarction without residual deficits: Secondary | ICD-10-CM | POA: Insufficient documentation

## 2018-02-05 DIAGNOSIS — I2510 Atherosclerotic heart disease of native coronary artery without angina pectoris: Secondary | ICD-10-CM | POA: Insufficient documentation

## 2018-02-05 DIAGNOSIS — Z7902 Long term (current) use of antithrombotics/antiplatelets: Secondary | ICD-10-CM | POA: Diagnosis not present

## 2018-02-05 DIAGNOSIS — I130 Hypertensive heart and chronic kidney disease with heart failure and stage 1 through stage 4 chronic kidney disease, or unspecified chronic kidney disease: Secondary | ICD-10-CM | POA: Diagnosis not present

## 2018-02-05 DIAGNOSIS — Z79899 Other long term (current) drug therapy: Secondary | ICD-10-CM | POA: Insufficient documentation

## 2018-02-05 DIAGNOSIS — Z951 Presence of aortocoronary bypass graft: Secondary | ICD-10-CM | POA: Insufficient documentation

## 2018-02-05 DIAGNOSIS — N183 Chronic kidney disease, stage 3 (moderate): Secondary | ICD-10-CM | POA: Insufficient documentation

## 2018-02-05 DIAGNOSIS — N3090 Cystitis, unspecified without hematuria: Secondary | ICD-10-CM

## 2018-02-05 DIAGNOSIS — I252 Old myocardial infarction: Secondary | ICD-10-CM | POA: Insufficient documentation

## 2018-02-05 DIAGNOSIS — N3091 Cystitis, unspecified with hematuria: Secondary | ICD-10-CM | POA: Diagnosis not present

## 2018-02-05 DIAGNOSIS — Z7982 Long term (current) use of aspirin: Secondary | ICD-10-CM | POA: Insufficient documentation

## 2018-02-05 DIAGNOSIS — R339 Retention of urine, unspecified: Secondary | ICD-10-CM | POA: Diagnosis not present

## 2018-02-05 DIAGNOSIS — N4889 Other specified disorders of penis: Secondary | ICD-10-CM | POA: Diagnosis present

## 2018-02-05 MED ORDER — FENTANYL CITRATE (PF) 100 MCG/2ML IJ SOLN
100.0000 ug | Freq: Once | INTRAMUSCULAR | Status: DC
  Filled 2018-02-05: qty 2

## 2018-02-05 MED ORDER — LORAZEPAM 2 MG/ML IJ SOLN: 0.5000 mg | Freq: Once | INTRAMUSCULAR | Status: DC

## 2018-02-05 NOTE — ED Notes (Signed)
Bed: CZ66 Expected date:  Expected time:  Means of arrival:  Comments: 77 yr old bleeding from urinary catheter

## 2018-02-05 NOTE — ED Triage Notes (Signed)
Patient BIB EMS for catheter issues and bleeding from penis. Patient had a foley put in due to UTI and urinary retention. Catheter was "flushed with no return". No urine or blood in the urine bag at this time. Patient on blood thinners.

## 2018-02-05 NOTE — ED Notes (Signed)
Patient is still appears to be bleeding from penis.

## 2018-02-06 ENCOUNTER — Encounter (HOSPITAL_COMMUNITY): Payer: Self-pay | Admitting: Emergency Medicine

## 2018-02-06 ENCOUNTER — Ambulatory Visit: Payer: Medicare Other | Admitting: Rehabilitation

## 2018-02-06 ENCOUNTER — Encounter: Payer: Medicare Other | Admitting: Speech Pathology

## 2018-02-06 LAB — URINALYSIS, ROUTINE W REFLEX MICROSCOPIC
Bilirubin Urine: NEGATIVE
GLUCOSE, UA: NEGATIVE mg/dL
Ketones, ur: NEGATIVE mg/dL
Leukocytes, UA: NEGATIVE
Nitrite: NEGATIVE
PH: 6 (ref 5.0–8.0)
Protein, ur: 100 mg/dL — AB
RBC / HPF: >50 RBC/hpf — ABNORMAL HIGH (ref 0–5)
Specific Gravity, Urine: 1.008 (ref 1.005–1.030)

## 2018-02-06 LAB — I-STAT CHEM 8, ED
BUN: 10 mg/dL (ref 8–23)
CREATININE: 0.7 mg/dL (ref 0.61–1.24)
Calcium, Ion: 1.13 mmol/L — ABNORMAL LOW (ref 1.15–1.40)
Chloride: 93 mmol/L — ABNORMAL LOW (ref 98–111)
GLUCOSE: 119 mg/dL — AB (ref 70–99)
HEMATOCRIT: 35 % — AB (ref 39.0–52.0)
HEMOGLOBIN: 11.9 g/dL — AB (ref 13.0–17.0)
POTASSIUM: 3.3 mmol/L — AB (ref 3.5–5.1)
Sodium: 128 mmol/L — ABNORMAL LOW (ref 135–145)
TCO2: 22 mmol/L (ref 22–32)

## 2018-02-06 LAB — CBC WITH DIFFERENTIAL/PLATELET
BASOS ABS: 0 10*3/uL (ref 0.0–0.1)
Basophils Relative: 0 %
EOS PCT: 4 %
Eosinophils Absolute: 0.5 10*3/uL (ref 0.0–0.7)
HEMATOCRIT: 35.6 % — AB (ref 39.0–52.0)
Hemoglobin: 12 g/dL — ABNORMAL LOW (ref 13.0–17.0)
Lymphocytes Relative: 17 %
Lymphs Abs: 2.2 10*3/uL (ref 0.7–4.0)
MCH: 30.6 pg (ref 26.0–34.0)
MCHC: 33.7 g/dL (ref 30.0–36.0)
MCV: 90.8 fL (ref 78.0–100.0)
MONO ABS: 0.9 10*3/uL (ref 0.1–1.0)
MONOS PCT: 7 %
Neutro Abs: 9.1 10*3/uL — ABNORMAL HIGH (ref 1.7–7.7)
Neutrophils Relative %: 72 %
PLATELETS: 237 10*3/uL (ref 150–400)
RBC: 3.92 MIL/uL — ABNORMAL LOW (ref 4.22–5.81)
RDW: 13.3 % (ref 11.5–15.5)
WBC: 12.7 10*3/uL — AB (ref 4.0–10.5)

## 2018-02-06 MED ORDER — CIPROFLOXACIN HCL 500 MG PO TABS: 500.0000 mg | ORAL_TABLET | Freq: Two times a day (BID) | ORAL | 0 refills | Status: DC

## 2018-02-06 MED ORDER — CIPROFLOXACIN HCL 500 MG PO TABS
500.0000 mg | ORAL_TABLET | Freq: Once | ORAL | Status: AC
  Administered 2018-02-06: 500 mg via ORAL
  Filled 2018-02-06: qty 1

## 2018-02-06 MED ORDER — FENTANYL CITRATE (PF) 100 MCG/2ML IJ SOLN
100.0000 ug | Freq: Once | INTRAMUSCULAR | Status: AC
  Administered 2018-02-06: 100 ug via INTRAVENOUS

## 2018-02-06 NOTE — ED Notes (Signed)
Prior to obtaining IV, attempted to remove foley catheter. Helen, NT was at bedside to assist but informed her to get Dr. Francis Gaines. She came to bedside to assess the situation. She informed she would contact urology and to administer pain medication.

## 2018-02-06 NOTE — ED Provider Notes (Signed)
[facility]-EMERGENCY DEPT Provider Note   CSN: [id number] Arrival date & time: 02/05/18  2218     History   Chief Complaint No chief complaint on file.   HPI Thomas [name]. is a 77 y.o. male.  The history is provided by the EMS personnel and medical records.  Illness  This is a recurrent problem. The current episode started 12 to 24 hours ago. The problem occurs constantly. The problem has not changed since onset.Associated symptoms include abdominal pain. Pertinent negatives include no chest pain, no headaches and no shortness of breath. Nothing aggravates the symptoms. Nothing relieves the symptoms. He has tried nothing for the symptoms. The treatment provided no relief.  Foley inserted at [place] this am for retention.  Patient has not had any output since 7 am.  No f/c/r.   Past Medical History:  Diagnosis Date  . CHF (congestive heart failure) (HCC) 1999  . CKD (chronic kidney disease), stage II   . Coronary artery disease   . Hx of colonic polyps   . Hyperlipidemia   . Hyperplastic colon polyp 04/15/2007  . Hypertension   . Prostatic hypertrophy, benign    with elevated PSA  . Stroke (HCC)   . TIA (transient ischemic attack) 2003   "mini stroke" (03/19/2013)    Patient Active Problem List   Diagnosis Date Noted  . Delirium 01/26/2018  . 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 Foul smelling urine   . Leukocytosis   . Hyponatremia   . 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 Benign essential HTN   . Coronary artery disease involving native coronary artery of native heart without angina pectoris   . Stage 3 chronic kidney disease (HCC)   . Acute lower UTI   . Seasonal allergies   . Stroke (HCC) 11/04/2017  . HLD (hyperlipidemia) 11/04/2017  . Chronic diastolic CHF (congestive heart failure) (HCC) 11/04/2017  . Acute renal failure superimposed on stage 2 chronic kidney disease (HCC) 11/04/2017  . Acute metabolic  encephalopathy 11/04/2017  . UTI (urinary tract infection) 11/04/2017  . AKI (acute kidney injury) (HCC)   . History of stroke   . Prediabetes   . Left hemiparesis (HCC) 03/19/2013  . Cerebral infarction (HCC) 03/19/2013  . Left leg weakness 03/19/2013  . CAD, ARTERY BYPASS GRAFT 09/30/2008  . 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 Borderline hyperglycemia 12/03/2007  . Other and unspecified hyperlipidemia 07/28/2007  . Essential hypertension 07/28/2007  . 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 Coronary atherosclerosis 07/28/2007  . BENIGN PROSTATIC HYPERTROPHY 07/28/2007  . COLONIC POLYPS, 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    Past Surgical History:  Procedure Laterality Date  . CARDIAC CATHETERIZATION    . CATARACT EXTRACTION W/ INTRAOCULAR LENS  IMPLANT, BILATERAL Bilateral 1998  . COLONOSCOPY  2008  . CORONARY ARTERY BYPASS GRAFT  1999   x4  . LOOP RECORDER INSERTION N/A 11/05/2017   Procedure: LOOP RECORDER INSERTION;  Surgeon: Hillis Range, MD;  Location: [facility] INVASIVE CV LAB;  Service: Cardiovascular;  Laterality: N/A;  . TEE WITHOUT CARDIOVERSION N/A 11/05/2017   Procedure: TRANSESOPHAGEAL ECHOCARDIOGRAM (TEE) WITH LOOP;  Surgeon: Lars Masson, MD;  Location: [facility] ENDOSCOPY;  Service: Cardiovascular;  Laterality: N/A;        Home Medications    Prior to Admission medications   Medication Sig Start Date End Date Taking? Authorizing Provider  acetaminophen (TYLENOL) 325 MG tablet Take 1-2 tablets (325-650 mg total) by mouth every 4 (four) hours as  needed for mild pain. 11/19/17   Love, Evlyn Kanner, PA-C  aspirin EC 325 MG EC tablet Take 1 tablet (325 mg total) by mouth daily. 11/06/17   Joseph Art, DO  atorvastatin (LIPITOR) 80 MG tablet Take 1 tablet (80 mg total) by mouth daily. Patient taking differently: Take 80 mg by mouth daily at 6 PM.  11/06/17   Joseph Art, DO  clopidogrel (PLAVIX) 75 MG tablet TAKE 1 TABLET BY MOUTH  DAILY WITH BREAKFAST Patient taking differently: TAKE 1  TABLET (75mg ) BY MOUTH  DAILY WITH BREAKFAST 06/10/17   Wendall Stade, MD  Flax OIL Take 1 capsule by mouth daily.      [provider]  fluticasone (FLONASE) 50 MCG/ACT nasal spray Place 2 sprays into both nostrils daily as needed for allergies or rhinitis. 11/19/17   Love, Evlyn Kanner, PA-C  folic acid (FOLVITE) 1 MG tablet TAKE 1 TABLET BY MOUTH  EVERY DAY 02/03/18   Wendall Stade, MD  hydrocerin (EUCERIN) CREA Apply 1 application topically 2 (two) times daily. 11/19/17   Love, Evlyn Kanner, PA-C  ketoconazole (NIZORAL) 2 % cream Apply 1 application topically 2 (two) times daily. Patient not taking: Reported on 01/26/2018 11/25/17   Nelwyn Salisbury, MD  loratadine (CLARITIN) 10 MG tablet Take 10 mg by mouth at bedtime.     [provider]  metoprolol tartrate (LOPRESSOR) 50 MG tablet TAKE 1 TABLET BY MOUTH TWO  TIMES DAILY Patient taking differently: TAKE 1 TABLET (50mg ) BY MOUTH TWO  TIMES DAILY 01/28/17   Wendall Stade, MD  Omega-3 Fatty Acids (FISH OIL) 1000 MG CAPS Take 1 capsule by mouth daily.      [provider]  tamsulosin (FLOMAX) 0.4 MG CAPS capsule Take 1 capsule (0.4 mg total) by mouth daily after supper. 01/21/18   Ranelle Oyster, MD    Family History Family History  Problem Relation Age of Onset  . Coronary artery disease Mother   . Heart disease Mother   . Stroke Father   . Heart disease Father   . Heart disease Brother     Social History Social History   Tobacco Use  . Smoking status: Former Smoker    Packs/day: 0.75    Years: 50.00    Pack years: 37.50    Types: Cigarettes    Last attempt to quit: 03/19/2013    Years since quitting: 4.8  . Smokeless tobacco: Never Used  Substance Use Topics  . Alcohol use: Yes    Alcohol/week: 0.6 oz    Types: 1 Cans of beer per week    Comment: 03/19/2013 "1 beer/week" does not drink anymore  . Drug use: No     Allergies   Sulfonamide derivatives   Review of Systems Review of Systems    Constitutional: Negative for fever.  Respiratory: Negative for shortness of breath.   Cardiovascular: Negative for chest pain.  Gastrointestinal: Positive for abdominal distention and abdominal pain.  Genitourinary: Positive for difficulty urinating.  Neurological: Negative for headaches.  All other systems reviewed and are negative.    Physical Exam Updated Vital Signs BP (!) 165/86 (BP Location: Left Arm)   Pulse 71   Temp 98 F (36.7 C) (Oral)   Resp 18   Wt 74.8 kg (165 lb)   SpO2 97%   BMI 23.01 kg/m   Physical Exam  Constitutional: He is oriented to person, place, and time. He appears well-developed and well-nourished. No distress.  HENT:  Head: Normocephalic and atraumatic.  Mouth/Throat: No oropharyngeal exudate.  Eyes: Conjunctivae and EOM are normal.  Neck: Normal range of motion. Neck supple.  Cardiovascular: Normal rate, regular rhythm, normal heart sounds and intact distal pulses.  Pulmonary/Chest: Effort normal and breath sounds normal. No stridor. He has no wheezes. He has no rales.  Abdominal: He exhibits distension. He exhibits no mass. There is no rebound. No hernia.  Bladder palpable to above umbilicus   Musculoskeletal: Normal range of motion.  Neurological: He is alert and oriented to person, place, and time. He displays normal reflexes.  Skin: Skin is warm and dry. Capillary refill takes less than 2 seconds.  Psychiatric: He has a normal mood and affect.     ED Treatments / Results  Labs (all labs ordered are listed, but only abnormal results are displayed) Results for orders placed or performed during the hospital encounter of 02/05/18  CBC with Differential/Platelet  Result Value Ref Range   WBC 12.7 (H) 4.0 - 10.5 K/uL   RBC 3.92 (L) 4.22 - 5.81 MIL/uL   Hemoglobin 12.0 (L) 13.0 - 17.0 g/dL   HCT 16.1 (L) 09.6 - 04.5 %   MCV 90.8 78.0 - 100.0 fL   MCH 30.6 26.0 - 34.0 pg   MCHC 33.7 30.0 - 36.0 g/dL   RDW 40.9 81.1 - 91.4 %   Platelets  237 150 - 400 K/uL   Neutrophils Relative % 72 %   Neutro Abs 9.1 (H) 1.7 - 7.7 K/uL   Lymphocytes Relative 17 %   Lymphs Abs 2.2 0.7 - 4.0 K/uL   Monocytes Relative 7 %   Monocytes Absolute 0.9 0.1 - 1.0 K/uL   Eosinophils Relative 4 %   Eosinophils Absolute 0.5 0.0 - 0.7 K/uL   Basophils Relative 0 %   Basophils Absolute 0.0 0.0 - 0.1 K/uL  I-Stat Chem 8, ED  Result Value Ref Range   Sodium 128 (L) 135 - 145 mmol/L   Potassium 3.3 (L) 3.5 - 5.1 mmol/L   Chloride 93 (L) 98 - 111 mmol/L   BUN 10 8 - 23 mg/dL   Creatinine, Ser 7.82 0.61 - 1.24 mg/dL   Glucose, Bld 956 (H) 70 - 99 mg/dL   Calcium, Ion 2.13 (L) 1.15 - 1.40 mmol/L   TCO2 22 22 - 32 mmol/L   Hemoglobin 11.9 (L) 13.0 - 17.0 g/dL   HCT 08.6 (L) 57.8 - 46.9 %   Dg Chest 2 View  Result Date: 01/26/2018 CLINICAL DATA:  Confusion with altered mental status EXAM: CHEST - 2 VIEW COMPARISON:  02/16/2014 FINDINGS: Post sternotomy changes. Left chest recording device. No significant effusion. Cardiomegaly with mild central vascular congestion. Aortic atherosclerosis. Atelectasis at the left base. No pneumothorax. IMPRESSION: 1. Mild cardiomegaly with minimal central vascular congestion. 2. Streaky atelectasis at the left base. Electronically Signed   By: Jasmine Pang M.D.   On: 01/26/2018 21:19   Ct Head Wo Contrast  Result Date: 01/26/2018 CLINICAL DATA:  77 y/o M; increased confusion. History of vascular dementia. EXAM: CT HEAD WITHOUT CONTRAST TECHNIQUE: Contiguous axial images were obtained from the base of the skull through the vertex without intravenous contrast. COMPARISON:  11/04/2017 MRI of the head, CT of the head, CT perfusion of the head, CTA of the head. FINDINGS: Brain: Chronic infarct of the right medial temporal lobe and occipital lobe. Confluent hypoattenuation in white matter is compatible with advanced chronic microvascular ischemic changes and stable from the prior study. There multiple chronic  lacunar infarcts  within the basal ganglia bilaterally and the pons. Stable brain parenchymal volume loss. Vascular: Calcific atherosclerosis of carotid siphons and vertebral arteries. Skull: Normal. Negative for fracture or focal lesion. Sinuses/Orbits: Mild mucosal thickening of the ethmoid air cells. Normal aeration of mastoid air cells. Bilateral intra-ocular lens replacement. Other: None. IMPRESSION: 1. No acute intracranial abnormality identified. 2. Stable advanced chronic microvascular ischemic changes and parenchymal volume loss of the brain. Multiple stable chronic 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 distribution infarct. Electronically Signed   By: Mitzi Hansen M.D.   On: 01/26/2018 20:27    Radiology No results found.  Procedures Procedures (including critical care time)  Medications Ordered in ED Medications  fentaNYL (SUBLIMAZE) injection 100 mcg (100 mcg Intravenous Given 02/06/18 0005)    Foley could not be removed in the ED.  Bladder is above umbilicus.  Seen by Dr. Carney Corners of urology foley replaced continue cipro leave foley in until office visit 7/10   Final Clinical Impressions(s) / ED Diagnoses   Return for weakness, numbness, changes in vision or speech, fevers >100.4 unrelieved by medication, shortness of breath, intractable vomiting, or diarrhea, abdominal pain, Inability to tolerate liquids or food, cough, altered mental status or any concerns. No signs of systemic illness or infection. The patient is nontoxic-appearing on exam and vital signs are within normal limits. Will refer to urology for microscopy hematuria as patient is asymptomatic.  I have reviewed the triage vital signs and the nursing notes. Pertinent labs &imaging results that were available during my care of the patient were reviewed by me and considered in my medical decision making (see chart for details).  After history, exam, and medical workup I feel the patient has  been appropriately medically screened and is safe for discharge home. Pertinent diagnoses were discussed with the patient. Patient was given return precautions.    Kalijah Westfall, MD 02/06/18 [id number]

## 2018-02-06 NOTE — Consult Note (Signed)
Urology Consult Note   Requesting Attending Physician:  Cy Blamer, MD Service Providing Consult: Urology  Consulting Attending: Dr. Retta Diones   Reason for Consult:  Urinary retention  HPI: Thomas [name]. is seen in consultation for reasons noted above at the request of Palumbo, April, MD for evaluation of urinary retention and non-draining Foley catheter.  This is a 77 y.o. male with multiple cardiovascular comorbidities on ASA/Plavix as well as history of urinary retention and UTI.  Patient with recent UTIs currently on ciprofloxacin. This evening a Foley catheter was placed at his nursing facility after he went the entire day without urinating. The catheter reportedly never drained urine and there was difficulty removing the catheter. He was sent to the ED for further evaluation where nursing staff was unable to successfully remove. Urology was consulted for assistance.   Past Medical History: Past Medical History:  Diagnosis Date  . CHF (congestive heart failure) (HCC) 1999  . CKD (chronic kidney disease), stage II   . Coronary artery disease   . Hx of colonic polyps   . Hyperlipidemia   . Hyperplastic colon polyp 04/15/2007  . Hypertension   . Prostatic hypertrophy, benign    with elevated PSA  . Stroke (HCC)   . TIA (transient ischemic attack) 2003   "mini stroke" (03/19/2013)    Past Surgical History:  Past Surgical History:  Procedure Laterality Date  . CARDIAC CATHETERIZATION    . CATARACT EXTRACTION W/ INTRAOCULAR LENS  IMPLANT, BILATERAL Bilateral 1998  . COLONOSCOPY  2008  . CORONARY ARTERY BYPASS GRAFT  1999   x4  . LOOP RECORDER INSERTION N/A 11/05/2017   Procedure: LOOP RECORDER INSERTION;  Surgeon: Hillis Range, MD;  Location: [facility] INVASIVE CV LAB;  Service: Cardiovascular;  Laterality: N/A;  . TEE WITHOUT CARDIOVERSION N/A 11/05/2017   Procedure: TRANSESOPHAGEAL ECHOCARDIOGRAM (TEE) WITH LOOP;  Surgeon: Lars Masson, MD;  Location: [facility] ENDOSCOPY;   Service: Cardiovascular;  Laterality: N/A;    Medication: No current facility-administered medications for this encounter.    Current Outpatient Medications  Medication Sig Dispense Refill  . acetaminophen (TYLENOL) 325 MG tablet Take 1-2 tablets (325-650 mg total) by mouth every 4 (four) hours as needed for mild pain.    Marland Kitchen aspirin EC 325 MG EC tablet Take 1 tablet (325 mg total) by mouth daily. 30 tablet 0  . atorvastatin (LIPITOR) 80 MG tablet Take 1 tablet (80 mg total) by mouth daily. (Patient taking differently: Take 80 mg by mouth daily at 6 PM. )    . clopidogrel (PLAVIX) 75 MG tablet TAKE 1 TABLET BY MOUTH  DAILY WITH BREAKFAST (Patient taking differently: TAKE 1 TABLET (75mg ) BY MOUTH  DAILY WITH BREAKFAST) 90 tablet 3  . Flax OIL Take 1 capsule by mouth daily.      . fluticasone (FLONASE) 50 MCG/ACT nasal spray Place 2 sprays into both nostrils daily as needed for allergies or rhinitis.  2  . folic acid (FOLVITE) 1 MG tablet TAKE 1 TABLET BY MOUTH  EVERY DAY 90 tablet 2  . loratadine (CLARITIN) 10 MG tablet Take 10 mg by mouth at bedtime.     . metoprolol tartrate (LOPRESSOR) 50 MG tablet TAKE 1 TABLET BY MOUTH TWO  TIMES DAILY (Patient taking differently: TAKE 1 TABLET (50mg ) BY MOUTH TWO  TIMES DAILY) 180 tablet 3  . Omega-3 Fatty Acids (FISH OIL) 1000 MG CAPS Take 1 capsule by mouth daily.      . tamsulosin (FLOMAX) 0.4 MG CAPS  capsule Take 1 capsule (0.4 mg total) by mouth daily after supper. 30 capsule 4  . hydrocerin (EUCERIN) CREA Apply 1 application topically 2 (two) times daily. (Patient not taking: Reported on 02/06/2018)  0  . ketoconazole (NIZORAL) 2 % cream Apply 1 application topically 2 (two) times daily. (Patient not taking: Reported on 01/26/2018) 60 g 1    Allergies: Allergies  Allergen Reactions  . Sulfonamide Derivatives Other (See Comments)    REACTION: Weak,lethargic    Social History: Social History   Tobacco Use  . Smoking status: Former Smoker     Packs/day: 0.75    Years: 50.00    Pack years: 37.50    Types: Cigarettes    Last attempt to quit: 03/19/2013    Years since quitting: 4.8  . Smokeless tobacco: Never Used  Substance Use Topics  . Alcohol use: Yes    Alcohol/week: 0.6 oz    Types: 1 Cans of beer per week    Comment: 03/19/2013 "1 beer/week" does not drink anymore  . Drug use: No    Family History Family History  Problem Relation Age of Onset  . Coronary artery disease Mother   . Heart disease Mother   . Stroke Father   . Heart disease Father   . Heart disease Brother     Review of Systems 10 systems were reviewed and are negative except as noted specifically in the HPI.  Objective   Vital signs in last 24 hours: BP (!) 168/86 (BP Location: Left Arm)   Pulse 87   Temp 98 F (36.7 C) (Oral)   Resp 20   Wt 74.8 kg (165 lb)   SpO2 97%   BMI 23.01 kg/m   Physical Exam General: NAD, A&O, resting, appropriate HEENT: [place]/AT, EOMI, MMM Pulmonary: Normal work of breathing Cardiovascular: HDS, adequate peripheral perfusion Abdomen: Soft, NTTP, nondistended. GU: 16Fr Council tip catheter draining very thin [place] colored urine, compressive penile dressing in place with Kerlix/Coban Extremities: warm and well perfused Neuro: Appropriate, no focal neurological deficits  Most Recent Labs: Lab Results  Component Value Date   WBC 12.7 (H) 02/06/2018   HGB 11.9 (L) 02/06/2018   HCT 35.0 (L) 02/06/2018   PLT 237 02/06/2018    Lab Results  Component Value Date   NA 128 (L) 02/06/2018   K 3.3 (L) 02/06/2018   CL 93 (L) 02/06/2018   CO2 25 01/27/2018   BUN 10 02/06/2018   CREATININE 0.70 02/06/2018   CALCIUM 8.4 (L) 01/27/2018    Lab Results  Component Value Date   INR 1.13 11/03/2017   APTT 29 11/03/2017     IMAGING: No results found.  ------  Assessment:  77 y.o. male with multiple medical comorbidities on asa/plavix as well as history of urinary retention and UTI presenting from nursing  facility with non-draining Foley catheter placed today for urinary retention. Nursing unable to remove existing Foley catheter. Urology requested for assistance.  Foley successfully removed by Urology after balloon port cut and intubated with guidewire. There was initially brisk bleeding per urethra after catheter was removed. 16Fr 2-way Council-tip catheter placed over glidewire with return of ~1.59mL urine. 10cc sterile water instilled into balloon and statlock applied. Compressive penile dressing placed which successfully tamponaded urethral bleeding.   Recommendations: - OK to discharge from urologic perspective - Continue treatment course ciprofloxacin - Continue Flomax - Continue Foley catheter to drainage until seen in follow up - Removed compressive penile dressing (Kerlix/Coban) tomorrow - Patient to keep  previously scheduled follow up with [facility] on 02/19/2018, will likely perform TOV at that time.   Thank you for this consult. Please contact the urology consult pager with any further questions/concerns.

## 2018-02-06 NOTE — ED Notes (Signed)
Pt. I-stat Chem 8 results potassium 3.3, Palumbo,MD., and RN,Natalie made aware.

## 2018-02-07 ENCOUNTER — Telehealth: Payer: Self-pay | Admitting: Rehabilitative and Restorative Service Providers"

## 2018-02-07 LAB — URINE CULTURE
Culture: NO GROWTH
SPECIAL REQUESTS: NORMAL

## 2018-02-07 NOTE — Telephone Encounter (Signed)
PT contacted the patient's wife re: recent ED visits and health status as he is scheduled to return to our clinic 2nd week of July.  The patient's wife reports that she would like for him to come back to OP neuro.   He had a UTI with other urinary issues and a change in mental status.  He was admitted to [facility], d/c to [facility] (SNF) where she reports he did not get adequate therapy and she had to pay a sitter for care when she was not supervising him b/c of high fall risk.  PT inquired if home health would be a better option due to recent changes and she reports his mobility is close to where it was prior to his hospital admission for urinary tract infection.  She would like to return to OP neuro.  PT reviewed that we were nearing d/c from PT services with goals on safe ambulation (with supervision at home), home program performance with assist from wife, and going to gym 2 days/week to use stepper (at wife's request b/c he does better when she gets him out of the house regularly).  She also plans to pursue him going to [facility] 1-2 days/week for adult day program.    PT will keep on caseload at this time per our conversation with upcoming d/c with above goals.  Dolphus Linch, PT

## 2018-02-10 ENCOUNTER — Encounter: Payer: Medicare Other | Admitting: Speech Pathology

## 2018-02-10 ENCOUNTER — Ambulatory Visit (INDEPENDENT_AMBULATORY_CARE_PROVIDER_SITE_OTHER): Payer: Medicare Other | Admitting: *Deleted

## 2018-02-10 DIAGNOSIS — I6340 Cerebral infarction due to embolism of unspecified cerebral artery: Secondary | ICD-10-CM

## 2018-02-10 NOTE — Progress Notes (Signed)
Carelink Summary Report / Loop Recorder 

## 2018-02-11 ENCOUNTER — Encounter: Payer: Medicare Other | Admitting: Speech Pathology

## 2018-02-17 ENCOUNTER — Encounter: Payer: Medicare Other | Admitting: Speech Pathology

## 2018-02-19 ENCOUNTER — Ambulatory Visit: Payer: Medicare Other | Admitting: Rehabilitative and Restorative Service Providers"

## 2018-02-20 ENCOUNTER — Encounter: Payer: Medicare Other | Admitting: Speech Pathology

## 2018-02-24 ENCOUNTER — Encounter: Payer: Self-pay | Admitting: Rehabilitation

## 2018-02-24 ENCOUNTER — Encounter: Payer: Medicare Other | Admitting: Speech Pathology

## 2018-02-24 ENCOUNTER — Ambulatory Visit: Payer: Medicare Other | Attending: Physical Medicine & Rehabilitation | Admitting: Rehabilitation

## 2018-02-24 DIAGNOSIS — M6281 Muscle weakness (generalized): Secondary | ICD-10-CM | POA: Insufficient documentation

## 2018-02-24 DIAGNOSIS — R293 Abnormal posture: Secondary | ICD-10-CM

## 2018-02-24 DIAGNOSIS — R2689 Other abnormalities of gait and mobility: Secondary | ICD-10-CM | POA: Insufficient documentation

## 2018-02-24 DIAGNOSIS — R2681 Unsteadiness on feet: Secondary | ICD-10-CM | POA: Diagnosis present

## 2018-02-24 NOTE — Therapy (Signed)
[facility] [address] [place], [place], [address] Phone: [phone]   Fax:  [phone]  Physical Therapy Treatment and D/C Summary   Patient Details  Name: Thomas [name]. MRN: [id number] Date of Birth: 06-Nov-1940 Referring Provider: Alger Simons, MD   Encounter Date: 02/24/2018  PT End of Session - 02/24/18 0939    Visit Number  15    Number of Visits  17    Date for PT Re-Evaluation  02/24/18 updated to reflect today's visit (recert and D/C)    Authorization Type  [employer] medicare     PT Start Time  0932    PT Stop Time  1015    PT Time Calculation (min)  43 min    Equipment Utilized During Treatment  Gait belt    Activity Tolerance  Patient tolerated treatment well    Behavior During Therapy  Agitated       Past Medical History:  Diagnosis Date  . CHF (congestive heart failure) ([place]) 1999  . CKD (chronic kidney disease), stage II   . Coronary artery disease   . Hx of colonic polyps   . Hyperlipidemia   . Hyperplastic colon polyp 04/15/2007  . Hypertension   . Prostatic hypertrophy, benign    with elevated PSA  . Stroke ([place] [facility])   . TIA (transient ischemic attack) 2003   "mini stroke" (03/19/2013)    Past Surgical History:  Procedure Laterality Date  . CARDIAC CATHETERIZATION    . CATARACT EXTRACTION W/ INTRAOCULAR LENS  IMPLANT, BILATERAL Bilateral 1998  . COLONOSCOPY  2008  . CORONARY ARTERY BYPASS GRAFT  1999   x4  . LOOP RECORDER INSERTION N/A 11/05/2017   Procedure: LOOP RECORDER INSERTION;  Surgeon: Thompson Grayer, MD;  Location: [facility];  Service: Cardiovascular;  Laterality: N/A;  . TEE WITHOUT CARDIOVERSION N/A 11/05/2017   Procedure: TRANSESOPHAGEAL ECHOCARDIOGRAM (TEE) WITH LOOP;  Surgeon: Dorothy Spark, MD;  Location: [facility] ENDOSCOPY;  Service: Cardiovascular;  Laterality: N/A;    There were no vitals filed for this visit.  Subjective Assessment - 02/24/18 0937     Subjective  Pt returns following hospitalization, going to SNF at [facility] and then back home.      Patient is accompained by:  Family member    Pertinent History  CKD, prior CVA using RW prior to recent admission, HTN, CHF.    Patient Stated Goals  Moving "a whole lot easier than I am now."  Wife notes she wants him to get stronger and get back to where he can have a little more independence.    Currently in Pain?  No/denies                       [facility] Adult PT Treatment/Exercise - 02/24/18 1300      Transfers   Transfers  Sit to Stand;Stand to Sit    Sit to Stand  4: Min guard;4: Min assist    Sit to Stand Details  Verbal cues for sequencing;Verbal cues for technique;Verbal cues for precautions/safety    Stand to Sit  3: Mod assist    Stand to Sit Details (indicate cue type and reason)  Verbal cues for sequencing;Verbal cues for technique;Verbal cues for precautions/safety    Stand to Sit Details  Max cues for turning and hand placement along with forward trunk flexion to avoid uncontrolled descent.       Ambulation/Gait   Ambulation/Gait  Yes  Ambulation/Gait Assistance  4: Min assist    Ambulation/Gait Assistance Details  Cues for attention to the L, increased R step length and posture throughout.     Ambulation Distance (Feet)  40 Feet x 1, 25' x1, 65' x 1    Assistive device  Rolling walker    Gait Pattern  Step-through pattern;Decreased step length - right;Decreased step length - left;Left genu recurvatum;Decreased trunk rotation;Decreased weight shift to left    Ambulation Surface  Level;Indoor      Self-Care   Other Self-Care Comments   Reviewed current HEP (all except towel roll stretch for anterior chest due to time constraint).  Pt needs cues for technique with PT providing education on how wife can assist/cue pt.               PT Education - 02/24/18 1256    Education provided  Yes    Education Details  getting lift chair to provide support  when pt feeling weak to avoid wife getting hurt helping him up    Person(s) Educated  Patient;Spouse    Methods  Explanation    Comprehension  Verbalized understanding       PT Short Term Goals - 12/23/17 1019      PT SHORT TERM GOAL #1   Title  The patient will be indep with HEP for LE strengthening, posture, stretching and general mobility.    Baseline  met 12/23/17    Time  4    Period  Weeks    Status  Achieved      PT SHORT TERM GOAL #2   Title  The patient will improve Berg balance score from 7/56 to > or equal to 16/56 to demonstrate improving independence for standing balance and transfers.    Baseline  Improved to 19/56.    Time  4    Period  Weeks    Status  Achieved      PT SHORT TERM GOAL #3   Title  The patient will move sit<>stand with UE support 5/5 trials mod indep to RW.    Baseline  moderate to max progressed to mod I to RW on 12/23/17    Time  4    Period  Weeks    Status  Achieved      PT SHORT TERM GOAL #4   Title  The patient will be further assessed on gait speed and LTG to follow.    Baseline  1.23 ft/sec with RW at [facility]     Time  4    Period  Weeks    Status  Achieved      PT SHORT TERM GOAL #5   Title  The patient will ambulate x 200 ft nonstop with RW and CGA to demonstrate improving endurance and dec'd caregiver assist.    Baseline  pt able to ambulate 300' nonstop at [facility] on 12/23/17    Time  4    Period  Weeks    Status  Achieved        PT Long Term Goals - 02/24/18 0223      PT LONG TERM GOAL #1   Title  The patient will improve functional status score from 21% to > or equal to 35% to demo improved perception of functional abiltiies.    Baseline  Patient is showing a decline in mobility and PT focusing on caregiver education and home safety.    Time  8    Period  Weeks  Status  Deferred      PT LONG TERM GOAL #2   Title  The patient will improve gait speed *baseline to be established after eval.    Time  8    Period  Weeks     Status  Achieved      PT LONG TERM GOAL #3   Title  The patient will improve Berg score from 7/56 to > or equal to 22/56 to demo improving standing balance for ADLs/functional tasks.    Baseline  01/23/18:  14/56 (*patient initially scored 7/56, then increased to 19/56,and now has declined to 14/56.  *This supports his functional mobility as well.  He sustained 2 falls and has had a decrease in mobility since that time (MD has reassessed).    Time  8    Period  Weeks    Status  Partially Met      PT LONG TERM GOAL #4   Title  The patient will negotiate 4 steps with one HR with supervision for safe entrance/exit from home.    Baseline  min to mod A with B rails 02/24/18    Time  8    Period  Weeks    Status  Not Met      PT LONG TERM GOAL #5   Title  The patient will ambulate x 350 ft with RW mod indep for improved household and short distance community negotiation to reduce caregiver assist.    Time  8    Period  Weeks    Status  Not Met      PT LONG TERM GOAL #6   Title  The patient will move floor<>stand with UE support and min A due to h/o falls.    Baseline  Patient requires min to mod A and is impulsive and agitated during floor transfer.    Time  8    Period  Weeks    Status  Partially Met      PT LONG TERM GOAL #7   Title  Pt will improve gait speed to 1.83 ft/sec w/ LRAD at S level in order to indicate decreased fall risk.     Baseline  01/23/18:  0.90 ft/sec with RW and CGA for safety.  *patient demonstrating decline in mobility.    Time  8    Period  Weeks    Status  Not Met            Plan - 02/24/18 1256    Clinical Impression Statement  Skilled session focused on assessment of remaining LTGs and wrapping up to prepare for D/C to commmunity fitness and HEP due to lack of progress from medical issues but mostly from poor cognition.  Pt's wife reports he has been weaker the past week but was doing well up until then, going to gym and doing HEP at home.  They report  he went to urologist last week but that urine was not checked.  Recommend if he does not return to baseline, they seek MD guidance to rule out UTI as this can cause decline in function for pt.  Pt and wife verbalized understanding.  Also recommended use of lift chair (not at all times and not to fully stand) when pt is feeling weaker as wife is at risk for hurting herself trying to assist pt.  Both verbalize understanding.  Ready for D/C.     PT Treatment/Interventions  ADLs/Self Care Home Management;Gait training;Stair training;Functional mobility training;Therapeutic activities;Therapeutic exercise;Neuromuscular re-education;Balance training;Patient/family education;Orthotic  Fit/Training;Manual techniques    Consulted and Agree with Plan of Care  Patient       Patient will benefit from skilled therapeutic intervention in order to improve the following deficits and impairments:  Abnormal gait, Decreased endurance, Decreased activity tolerance, Decreased strength, Decreased safety awareness, Impaired flexibility, Postural dysfunction, Decreased balance, Decreased mobility  Visit Diagnosis: Other abnormalities of gait and mobility  Unsteadiness on feet  Muscle weakness (generalized)  Abnormal posture    PHYSICAL THERAPY DISCHARGE SUMMARY  Visits from Start of Care: 15  Current functional level related to goals / functional outcomes: See LTGs above   Remaining deficits: Pt with L sided inattention/field cut, L hemiplegia, marked balance, strength and endurance deficits, however has made a decline in therapy due to medical complications as well as poor cognition.  Will plan to D/C at this time with HEP and gym program.    Education / Equipment: See pt instruction for HEP  Plan: Patient agrees to discharge.  Patient goals were not met. Patient is being discharged due to lack of progress.  ?????       Problem List Patient Active Problem List   Diagnosis Date Noted  . Delirium  01/26/2018  . 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 Foul smelling urine   . Leukocytosis   . Hyponatremia   . Acute right PCA stroke ([place]) 11/06/2017  . Benign essential HTN   . Coronary artery disease involving native coronary artery of native heart without angina pectoris   . Stage 3 chronic kidney disease ([place])   . Acute lower UTI   . Seasonal allergies   . Stroke ([place]) 11/04/2017  . HLD (hyperlipidemia) 11/04/2017  . Chronic diastolic CHF (congestive heart failure) ([place]) 11/04/2017  . Acute renal failure superimposed on stage 2 chronic kidney disease ([place]) 11/04/2017  . Acute metabolic encephalopathy 32/54/9826  . UTI (urinary tract infection) 11/04/2017  . AKI (acute kidney injury) ([place])   . History of stroke   . Prediabetes   . Left hemiparesis ([place]) 03/19/2013  . Cerebral infarction ([place]) 03/19/2013  . Left leg weakness 03/19/2013  . CAD, ARTERY BYPASS GRAFT 09/30/2008  . 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 Borderline hyperglycemia 12/03/2007  . Other and unspecified hyperlipidemia 07/28/2007  . Essential hypertension 07/28/2007  . 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 Coronary atherosclerosis 07/28/2007  . BENIGN PROSTATIC HYPERTROPHY 07/28/2007  . COLONIC POLYPS, 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    Cameron Sprang, PT, MPT [facility] [address] [place], [place], [address] Phone: [phone]   Fax:  [phone] 02/24/18, 1:08 PM  Name: Thomas [name]. MRN: [id number] Date of Birth: 1940-12-11

## 2018-02-24 NOTE — Patient Instructions (Signed)
Bridging    Slowly raise buttocks from floor, keeping stomach tight. Add a pillow (you may have to fold it) and place between knees and squeeze pillow while you lift butt.  Don't drop the pillow! Repeat _10___ times per set. Do __1__ sets per session. Do _2___ sessions per day.  http://orth.exer.us/1096  Copyright  VHI. All rights reserved.  Sit to Stand: Head Upright    With head upright, stand up slowly with eyes open.Place chair against wall for safety. Repeat __10__ times per session. Do __2__ sessions per day.  Copyright  VHI. All rights reserved.  Heel Slides    Squeeze pelvic floor and hold. Slide left heel along bed towards bottom. Hold for __5_ seconds. Slide back to flat knee position. Repeat __10_ times. Do _2__ times a day. Repeat with other leg.Make sure leg stays in line with body.    Copyright  VHI. All rights reserved.  Knee Extension (Hamstring Stretch) With Pelvis Neutral    Slowly and gently bring foot upand place on step stool. Be sure pelvis does not tip backward (flex) or rotate. Sit up tall and try to get knee straight. Hold _30-45__ seconds. Do _3__ times, each leg, _2__ times per day.  http://ss.exer.us/88  Copyright  VHI. All rights reserved.  Gastroc / Heel Cord Stretch - Seated With Towel    Sitin chair with foot on stool,place strap ortowel around ball of foot. Gently pull foot in toward body, stretching heel cord and calf. Hold for _30-45__ seconds. Repeat on involved leg. Repeat _3__ times. Do __2_ times per day.  Copyright  VHI. All rights reserved.   Thoracic Self-Mobilization (Supine)    With rolled towel placed lengthwise at lower ribs level, lie back on towel with arms outstretched. Hold _2 minutes. Relax. Repeat __1-2__ times per day as needed for postural stretching.  http://orth.exer.us/1001   Copyright  VHI. All rights reserved.    

## 2018-02-25 ENCOUNTER — Encounter: Payer: Medicare Other | Attending: Registered Nurse | Admitting: Physical Medicine & Rehabilitation

## 2018-02-25 ENCOUNTER — Encounter: Payer: Self-pay | Admitting: Physical Medicine & Rehabilitation

## 2018-02-25 ENCOUNTER — Encounter: Payer: Self-pay | Admitting: Adult Health

## 2018-02-25 VITALS — BP 90/55 | HR 60 | Ht 71.0 in | Wt 153.0 lb

## 2018-02-25 DIAGNOSIS — I63531 Cerebral infarction due to unspecified occlusion or stenosis of right posterior cerebral artery: Secondary | ICD-10-CM | POA: Diagnosis present

## 2018-02-25 DIAGNOSIS — Z87891 Personal history of nicotine dependence: Secondary | ICD-10-CM | POA: Diagnosis not present

## 2018-02-25 DIAGNOSIS — N182 Chronic kidney disease, stage 2 (mild): Secondary | ICD-10-CM | POA: Diagnosis not present

## 2018-02-25 DIAGNOSIS — N400 Enlarged prostate without lower urinary tract symptoms: Secondary | ICD-10-CM | POA: Insufficient documentation

## 2018-02-25 DIAGNOSIS — I509 Heart failure, unspecified: Secondary | ICD-10-CM | POA: Diagnosis not present

## 2018-02-25 DIAGNOSIS — Z951 Presence of aortocoronary bypass graft: Secondary | ICD-10-CM | POA: Insufficient documentation

## 2018-02-25 DIAGNOSIS — F01518 Vascular dementia, unspecified severity, with other behavioral disturbance: Secondary | ICD-10-CM

## 2018-02-25 DIAGNOSIS — E785 Hyperlipidemia, unspecified: Secondary | ICD-10-CM | POA: Insufficient documentation

## 2018-02-25 DIAGNOSIS — I130 Hypertensive heart and chronic kidney disease with heart failure and stage 1 through stage 4 chronic kidney disease, or unspecified chronic kidney disease: Secondary | ICD-10-CM | POA: Diagnosis not present

## 2018-02-25 DIAGNOSIS — F0151 Vascular dementia with behavioral disturbance: Secondary | ICD-10-CM | POA: Diagnosis not present

## 2018-02-25 DIAGNOSIS — I2510 Atherosclerotic heart disease of native coronary artery without angina pectoris: Secondary | ICD-10-CM | POA: Diagnosis not present

## 2018-02-25 NOTE — Progress Notes (Signed)
Subjective:    Patient ID: Thomas [name]., male    DOB: 04-16-1941, 77 y.o.   MRN: [id number]  HPI   Patient is here in follow-up of his right PCA infarct.  After I last saw him he was back in the hospital/ED for urosepsis,  and associated encephalopathy.  He was discharged to skilled nursing facility for short-term before going back to the ED for a urinary catheter which became lodged in his bladder.  He is followed by urology as an outpt. He has an indwelling catheter currently. He is now back home. He is doing some walking and using the stationary bike  With the hospital admission and encephalopathy he was taken off the depakote and seroquel.  Wife notes that since he has been home and his catheter is in place at night to allow sleep that his demeanor has been much better.  He has been much more cooperative although he still has safety awareness issues.  Wife also describes that his left ankle is turning in during weightbearing and sometimes causes instability and discomfort.  She has tried an Ace wrap at times which provides some support.      Pain Inventory Average Pain 5 Pain Right Now 0 My pain is dull  In the last 24 hours, has pain interfered with the following? General activity 5 Relation with others 6 Enjoyment of life 4 What TIME of day is your pain at its worst? . Sleep (in general) Fair  Pain is worse with: walking, bending and standing Pain improves with: rest Relief from Meds: na  Mobility walk with assistance use a walker ability to climb steps?  no do you drive?  no use a wheelchair needs help with transfers  Function retired  Neuro/Psych weakness trouble walking confusion  Prior Studies Any changes since last visit?  no  Physicians involved in your care Any changes since last visit?  no   Family History  Problem Relation Age of Onset  . Coronary artery disease Mother   . Heart disease Mother   . Stroke Father   . Heart disease  Father   . Heart disease Brother    Social History   Socioeconomic History  . Marital status: Married    Spouse name: cathy  . Number of children: 2  . Years of education: college  . Highest education level: Not on file  Occupational History  . Occupation: retired  Engineer, production  . Financial resource strain: Not on file  . Food insecurity:    Worry: Not on file    Inability: Not on file  . Transportation needs:    Medical: Not on file    Non-medical: Not on file  Tobacco Use  . Smoking status: Former Smoker    Packs/day: 0.75    Years: 50.00    Pack years: 37.50    Types: Cigarettes    Last attempt to quit: 03/19/2013    Years since quitting: 4.9  . Smokeless tobacco: Never Used  Substance and Sexual Activity  . Alcohol use: Yes    Alcohol/week: 0.6 oz    Types: 1 Cans of beer per week    Comment: 03/19/2013 "1 beer/week" does not drink anymore  . Drug use: No  . Sexual activity: Not Currently  Lifestyle  . Physical activity:    Days per week: Not on file    Minutes per session: Not on file  . Stress: Not on file  Relationships  . Social connections:  Talks on phone: Not on file    Gets together: Not on file    Attends religious service: Not on file    Active member of club or organization: Not on file    Attends meetings of clubs or organizations: Not on file    Relationship status: Not on file  Other Topics Concern  . Not on file  Social History Narrative   Patient lives at home with wife Thomas [name])   Retired.   Education one year of college.   Right handed.   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    Past Surgical History:  Procedure Laterality Date  . CARDIAC CATHETERIZATION    . CATARACT EXTRACTION W/ INTRAOCULAR LENS  IMPLANT, BILATERAL Bilateral 1998  . COLONOSCOPY  2008  . CORONARY ARTERY BYPASS GRAFT  1999   x4  . LOOP RECORDER INSERTION N/A 11/05/2017   Procedure: LOOP RECORDER INSERTION;  Surgeon: Hillis Range, MD;  Location: [facility];   Service: Cardiovascular;  Laterality: N/A;  . TEE WITHOUT CARDIOVERSION N/A 11/05/2017   Procedure: TRANSESOPHAGEAL ECHOCARDIOGRAM (TEE) WITH LOOP;  Surgeon: Lars Masson, MD;  Location: [facility] ENDOSCOPY;  Service: Cardiovascular;  Laterality: N/A;   Past Medical History:  Diagnosis Date  . CHF (congestive heart failure) ([facility]) 1999  . CKD (chronic kidney disease), stage II   . Coronary artery disease   . Hx of colonic polyps   . Hyperlipidemia   . Hyperplastic colon polyp 04/15/2007  . Hypertension   . Prostatic hypertrophy, benign    with elevated PSA  . Stroke ([facility])   . TIA (transient ischemic attack) 2003   "mini stroke" (03/19/2013)   There were no vitals taken for this visit.  Opioid Risk Score:   Fall Risk Score:  `1  Depression screen PHQ 2/9  Depression screen [facility] 2/9 01/21/2018 12/04/2017 11/15/2016 11/15/2016 07/13/2014 06/25/2013  Decreased Interest 0 1 0 0 0 0  Down, Depressed, Hopeless 0 0 0 0 0 0  PHQ - 2 Score 0 1 0 0 0 0  Altered sleeping - 0 - - - -  Tired, decreased energy - 1 - - - -  Change in appetite - 1 - - - -  Feeling bad or failure about yourself  - 1 - - - -  Trouble concentrating - 2 - - - -  Moving slowly or fidgety/restless - 1 - - - -  Suicidal thoughts - 1 - - - -  PHQ-9 Score - 8 - - - -     Review of Systems  Constitutional: Negative.   HENT: Negative.   Eyes: Negative.   Respiratory: Positive for shortness of breath.   Cardiovascular: Negative.   Gastrointestinal: Positive for constipation.  Endocrine: Negative.   Genitourinary: Positive for difficulty urinating.  Musculoskeletal: Positive for gait problem.  Skin: Negative.   Allergic/Immunologic: Negative.   Neurological: Positive for weakness.  Hematological: Bruises/bleeds easily.  Psychiatric/Behavioral: Positive for confusion.  All other systems reviewed and are negative.      Objective:   Physical Exam General: No acute distress HEENT: EOMI, oral membranes moist Cards:  reg rate  Chest: normal effort Abdomen: Soft, NT, ND Skin: dry, intact Extremities: no edema  Neurological:oriented to person, place, reason, year and month.    Mild left inattention. L HH.  Limited insight and awareness LUE:5/5 proximal to distal, RUE almost 5/5 RLE: 5-/5 proximal to distal LLE:5/5 proximal to distal.  Left heel cord is tight and left foot is in  a slight equinovarus position.  He had minimal pain with passive range of motion back to neutral Coordination improving. Sensation intact Skin: Warm Psychiatric:A bit flat but generally pleasant      Assessment & Plan:  1. 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  -Continue with home exercise program per direction and supervision of wife  -Provided heel cord and tibialis posterior stretches today  -We will look into purchasing a left ASO as well for further stability  -Wrote prescription for lift chair to assist with transfers from chair at home 2. CAD s/p CABG: On lipitor and ASA 3.CKD stage III 4. UTI/BPH:Continue Foley and care per urology 5. Neuro-psych/dementia: -Patient with ongoing cognitive deficits but has shown improvement now that wearing Foley catheter at night has allowed him to sleep  -No plans to resume medications at this point   Follow up with me in about  4 months.15 minutes of face to face patient care time were spent during this visit. All questions were encouraged and answered.Marland KitchenMarland Kitchen

## 2018-02-26 ENCOUNTER — Ambulatory Visit: Payer: Medicare Other | Admitting: Adult Health

## 2018-02-26 ENCOUNTER — Encounter: Payer: Self-pay | Admitting: Adult Health

## 2018-02-26 VITALS — BP 115/60 | HR 64 | Ht 71.0 in | Wt 153.6 lb

## 2018-02-26 DIAGNOSIS — I639 Cerebral infarction, unspecified: Secondary | ICD-10-CM

## 2018-02-26 DIAGNOSIS — I10 Essential (primary) hypertension: Secondary | ICD-10-CM | POA: Diagnosis not present

## 2018-02-26 DIAGNOSIS — E785 Hyperlipidemia, unspecified: Secondary | ICD-10-CM | POA: Diagnosis not present

## 2018-02-26 DIAGNOSIS — F0151 Vascular dementia with behavioral disturbance: Secondary | ICD-10-CM | POA: Diagnosis not present

## 2018-02-26 DIAGNOSIS — F01518 Vascular dementia, unspecified severity, with other behavioral disturbance: Secondary | ICD-10-CM

## 2018-02-26 NOTE — Patient Instructions (Signed)
Continue clopidogrel 75 mg daily  and lipitor  for secondary stroke prevention  Continue to follow up with PCP regarding cholesterol and blood pressure management   Continue to monitor blood pressure at home  Continue to stay active and eat healthy  Maintain strict control of hypertension with blood pressure goal below 130/90, diabetes with hemoglobin A1c goal below 6.5% and cholesterol with LDL cholesterol (bad cholesterol) goal below 70 mg/dL. I also advised the patient to eat a healthy diet with plenty of whole grains, cereals, fruits and vegetables, exercise regularly and maintain ideal body weight.  Followup in the future with me in 6 months or call earlier if needed        Thank you for coming to see [place] at [facility]. I hope we have been able to provide you high quality care today.  You may receive a patient satisfaction survey over the next few weeks. We would appreciate your feedback and comments so that we may continue to improve ourselves and the health of our patients.

## 2018-02-26 NOTE — Progress Notes (Signed)
STROKE NEUROLOGY FOLLOW UP NOTE  NAME: Thomas [name]. DOB: 21-Dec-1940  REASON FOR VISIT: stroke follow up HISTORY FROM: wife and chart  Today we had the pleasure of seeing Thomas [name]. in follow-up at our Neurology Clinic. Pt was accompanied by wife.   History Summary Mr. Thomas [name]. is a 77 y.o. male with history of CHF, CKD, CAD s/p CABG 1999, HLD, HTN, TIA in 2003 and stroke in 2014 admitted on 11/03/17 for confusion and left visual field cut. No tPA given due to clear hypodensity on CT. MRI showed right PCA large, right cerebellum and right MCA punctate infarcts,   As well as remote lacunar infarct in cerebellum, BG and brainstem.  CT head and neck right P3 flow decreased, left ICA proximal string sign at bifurcation, bilateral ICA siphon progressive atherosclerosis with stenosis, and right V1 and V4 stenosis.  EF 55 to 60%.  LE venous Doppler no DVT.  TEE unremarkable, loop recorder placed.  LDL 80 and A1c 5.8.  Discharged on DAPT for 3 months and then Plavix alone as well as high dose Lipitor.  Also referred to [facility] for outpatient follow-up of asymptomatic left ICA high-grade stenosis.  Hx of stroke/TIA  TIA 2003  Stroke 03/2013 with left lower extremity weakness, MRI showed right CR, left frontal cortex, left occipital white matter infarcts, LDL 55, A1c 5.9, carotid Doppler negative, MRI showed right VA and bilateral ICA siphon stenosis.  Aspirin 325 changed to aspirin 325 and Plavix 75 dual antiplatelet.  Followed with Dr. Pearlean Brownie in clinic and on plavix   12/23/17 visit JX: During the interval time, the patient has been doing well from a stroke standpoint.  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  Currently has outpatient PT/OT twice a week.  Still walk with walker.  Wife stated that patient had sleep-wake cycle disturbance, not sleep at night, taking naps during the day.  Sometimes has behavior changes, cursing to wife, agitated.  Still on aspirin and Plavix as well as  Lipitor without side effect.  BP today 117/66.  Loop recorder no A. fib so far.  02/26/18 UPDATE: Patient returns today for 77-month follow-up and is accompanied by his wife.  Since his previous visit, patient return back to ED for urosepsis and associated encephalopathy.  He was discharged to skilled nursing facility for short-term before going back to ED for urinary catheter which became lodged in his bladder.  It was recommended for close urology follow-up and patient was discharged home.  He currently has a Foley catheter in place that he will have until the end of July when he will undergo uroflowmetry and cystometry study.  He is followed regularly by urologist.  Patient does have residual left ankle weakness in which he is currently wearing a brace for.  He is currently sitting in a wheelchair that he uses for long distance but does use a rolling walker for short distance ambulation.  He has completed all rehabs but continues to do home exercises.  He continues to take Plavix without side effects of bleeding or bruising.  Continues to take Lipitor without side effects myalgias.  Blood pressure today satisfactory 115/60.  States his memory has been the same but his nighttime behavioral disturbances have greatly improved.  Loop recorder has not shown atrial fibrillation thus far.  Denies new or worsening stroke/TIA symptoms.   REVIEW OF SYSTEMS: Full 14 system review of systems performed and notable only for those listed below and in HPI  above, all others are negative:  Shortness of breath, frequent waking, walking difficulty, agitation, cold intolerance, bruise easily    The following represents the patient's updated allergies and side effects list: Allergies  Allergen Reactions  . Sulfonamide Derivatives Other (See Comments)    REACTION: Weak,lethargic    The neurologically relevant items on the patient's problem list were reviewed on today's visit.  Neurologic Examination  A problem  focused neurological exam (12 or more points of the single system neurologic examination, vital signs counts as 1 point, cranial nerves count for 8 points) was performed.  Blood pressure 115/60, pulse 64, height 5\' 11"  (1.803 m), weight 153 lb 9.6 oz (69.7 kg), SpO2 96 %.  General -frail elderly Caucasian male, in no apparent distress.  Ophthalmologic - Fundi not visualized due to eye movement.  Cardiovascular - Regular rate and rhythm.  Mental Status -  Level of arousal and orientation to time, place, and person were intact. Language including expression, naming, repetition, comprehension was assessed and found intact. Fund of Knowledge was assessed and was intact.  Cranial Nerves II - XII - II - unable to test visual fields due to uncooperation.  III, IV, VI - Extraocular movements intact. V - Facial sensation intact bilaterally. VII - Facial movement intact bilaterally. VIII - Hearing & vestibular intact bilaterally. X - Palate elevates symmetrically. XI - Chin turning & shoulder shrug intact bilaterally. XII - Tongue protrusion intact.  Motor Strength - The patient's strength was normal in all extremities and pronator drift was absent.  Bulk was normal and fasciculations were absent.   Motor Tone - Muscle tone was assessed at the neck and appendages and was normal.  Reflexes - The patient's reflexes were 1+ in all extremities and he had no pathological reflexes.  Sensory - Light touch, temperature/pinprick were assessed and were normal.    Coordination - The patient had normal movements in the hands with no ataxia or dysmetria.  Tremor was absent.  Gait and Station - unable to test ambulation due to not having rolling walker present at appointment today    Data reviewed: I personally reviewed the images and agree with the radiology interpretations.  **no recent imaging or labs    Assessment: Thomas [name] is a 77 year old with cryptogenic right PCA infarct, right  cerebellum and right MCA punctate infarcts on 11/03/17. Vascular risk factors include CHF, CAD, HLD, and HTN.    Discharged on DAPT for 3 months and then Plavix alone as well as high dose Lipitor.  Also referred to [facility] for outpatient follow-up of asymptomatic left ICA high-grade stenosis.  Patient returns today for follow-up and is accompanied by his wife.   Plan:  - continue plavix and Lipitor for secondary stroke prevention -Continue home exercises - Follow up with your primary care physician for stroke risk factor modification. Recommend maintain blood pressure goal <130/80, diabetes with hemoglobin A1c goal below 7.0% and lipids with LDL cholesterol goal below 70 mg/dL.  -Continue to monitor blood pressure at home - Continue to follow up with loop recorder   follow up in 6 months or call earlier if needed  I spent more than 25 minutes of face to face time with the patient. Greater than 50% of time was spent in counseling and coordination of care. We discussed possible vascular dementia, no driving due to left hemianopia, follow-up with vascular surgery.   George Hugh, AGNP-BC  [facility] [address] [place], [place] [address]  Phone [phone] Fax [phone] Note: This document was prepared with digital dictation and possible smart phrase technology. Any transcriptional errors that result from this process are unintentional.

## 2018-02-27 ENCOUNTER — Encounter: Payer: Medicare Other | Admitting: Speech Pathology

## 2018-03-01 NOTE — Progress Notes (Signed)
I agree with the above plan 

## 2018-03-02 ENCOUNTER — Other Ambulatory Visit: Payer: Self-pay | Admitting: Cardiovascular Disease

## 2018-03-03 ENCOUNTER — Encounter: Payer: Medicare Other | Admitting: Speech Pathology

## 2018-03-06 ENCOUNTER — Encounter: Payer: Medicare Other | Admitting: Speech Pathology

## 2018-03-11 ENCOUNTER — Ambulatory Visit: Payer: Medicare Other | Admitting: *Deleted

## 2018-03-11 LAB — CUP PACEART REMOTE DEVICE CHECK
Date Time Interrogation Session: 20190630143813
MDC IDC PG IMPLANT DT: 20190326

## 2018-03-12 NOTE — Progress Notes (Signed)
Opened in error

## 2018-03-14 ENCOUNTER — Ambulatory Visit: Payer: Medicare Other | Admitting: *Deleted

## 2018-03-14 ENCOUNTER — Ambulatory Visit (INDEPENDENT_AMBULATORY_CARE_PROVIDER_SITE_OTHER): Payer: Medicare Other | Admitting: *Deleted

## 2018-03-14 DIAGNOSIS — I6340 Cerebral infarction due to embolism of unspecified cerebral artery: Secondary | ICD-10-CM

## 2018-03-14 NOTE — Progress Notes (Signed)
Carelink Summary Report / Loop Recorder 

## 2018-04-03 ENCOUNTER — Encounter: Payer: Self-pay | Admitting: Speech Pathology

## 2018-04-03 NOTE — Therapy (Signed)
SPEECH THERAPY DISCHARGE SUMMARY  Visits from Start of Care: 5  Current functional level related to goals / functional outcomes: Goals partially met; see below.   Remaining deficits:  Pt continues with significant cognitive deficits in attention, executive function, awareness affecting his safety judgement and awareness   Education / Equipment: none  Plan: Patient agrees to discharge.  Patient goals were not met. Patient is being discharged due to the patient's request.  ?????          SLP Short Term Goals - 04/03/18 1556      SLP SHORT TERM GOAL #1   Title  Pt will attend to simple cognitive linguistic tasks in mildly distracting environment for 10 minutes over 2 sessions     Status  Partially Met      SLP SHORT TERM GOAL #2   Title  Pt will ID and correct 3/5 errors on simple cognitive linguistic tasks with occasional min A over 2 sessions.     Status  Not Met      SLP SHORT TERM GOAL #3   Title  Pt will complete mildy complex organization, reasoning, problem solving tasks with 85% accuracy and occasional min A    Status  Not Met      SLP SHORT TERM GOAL #4   Title  Pt will utilize external aids for functional recall and schedule management with rare min A over 2 sessions    Status  Not Met      SLP Long Term Goals - 04/03/18 1556      SLP LONG TERM GOAL #1   Title  Pt will alternate attention between 2 simple cognitive linguistic tasks with 80% accuracy on each and occasional min A     Status  Not Met      SLP LONG TERM GOAL #2   Title  Pt will self correct errors 4/5 errors with occasional min A     Status  Not Met      SLP LONG TERM GOAL #3   Title   Pt will report daily functional and effective use of compensatory strategies for recall of daily activities and appointments    Status  Not Met      SLP LONG TERM GOAL #4   Title  Spouse will demonstrate 2 strategies to improve pt's attending to and follow through of her directions/safety precautions with rare  min A over 3 sessions    Status  Not Met          Deneise Lever, [place], [place] [address] [place], [place], [address] Phone: [phone]   Fax:  [phone]  Patient Details  Name: Thomas [name]. MRN: [id number] Date of Birth: 05/27/1941 Referring Provider:  No ref. provider found  Encounter Date: 04/03/2018   Aliene Altes 04/03/2018, 3:56 PM  [place] [address] [place], [place], [address] Phone: [phone]   Fax:  [phone]

## 2018-04-16 ENCOUNTER — Ambulatory Visit (INDEPENDENT_AMBULATORY_CARE_PROVIDER_SITE_OTHER): Payer: Medicare Other | Admitting: *Deleted

## 2018-04-16 DIAGNOSIS — I6340 Cerebral infarction due to embolism of unspecified cerebral artery: Secondary | ICD-10-CM

## 2018-04-17 ENCOUNTER — Telehealth: Payer: Self-pay | Admitting: Adult Health

## 2018-04-17 ENCOUNTER — Telehealth: Payer: Self-pay | Admitting: Cardiovascular Disease

## 2018-04-17 NOTE — Telephone Encounter (Signed)
Pam with Dr. Ellis Savage office called requesting clearance for pt to stop clopidogrel (PLAVIX) 75 MG tablet to have an upcoming surgery. Stating they are awaiting ok from [facility] before scheduling the surgery. Pam can be reached at [phone] ext [phone]

## 2018-04-17 NOTE — Progress Notes (Signed)
Carelink Summary Report / Loop Recorder 

## 2018-04-17 NOTE — Telephone Encounter (Signed)
° °  [facility] Pre-operative Risk Assessment    Request for surgical clearance:  1. What type of surgery is being performed? Urolift  2. When is this surgery scheduled? Pending   3. What type of clearance is required (medical clearance vs. Pharmacy clearance to hold med vs. Both)? Pharmacy  4. Are there any medications that need to be held prior to surgery and how long?Can pt stop Plavix? If, when and for how long ?   5. Practice name and name of physician performing surgery? Dr Link Snuffer  6. 1What is your office phone number [phone]   7.   What is your office fax number [phone]  8.   Anesthesia type (None, local, MAC, general) ? General   Thomas [name] 04/17/2018, 4:00 PM  _________________________________________________________________   (provider comments below)

## 2018-04-17 NOTE — Telephone Encounter (Signed)
Left vm for Pam at Dr. Annette Stable surgical office. Rn advised they fax a clearance form to [phone]. LEft contact number to call Rn back.

## 2018-04-17 NOTE — Telephone Encounter (Signed)
Clearance form fax from [facility] office. Given to [facility] NP.

## 2018-04-18 NOTE — Telephone Encounter (Signed)
   Primary Cardiologist: Charlton Haws, MD  Chart reviewed as part of pre-operative protocol coverage. Asking to pharmacy clearance to hold Plavix. Patient with hx of CAD s/p CABG in 1999 and stroke in 2014 & 2019 s/p ILR 10/2017.  Dr. Eden Emms is it okay to hold Plavix and how long?  [place], [place] 04/18/2018, 9:20 AM

## 2018-04-18 NOTE — Telephone Encounter (Signed)
Ok to hold plavix 5 days before

## 2018-04-21 NOTE — Telephone Encounter (Signed)
Clearance form fax to [facility] at [phone]. Form fax twice,and confirmed. 

## 2018-04-23 ENCOUNTER — Other Ambulatory Visit: Payer: Self-pay | Admitting: Urology

## 2018-04-23 LAB — CUP PACEART REMOTE DEVICE CHECK
Date Time Interrogation Session: 20190802153544
MDC IDC PG IMPLANT DT: 20190326

## 2018-04-25 ENCOUNTER — Ambulatory Visit
Admission: RE | Admit: 2018-04-25 | Discharge: 2018-04-25 | Disposition: A | Discharge status: Home / Self Care | Payer: Medicare Other | Source: Ambulatory Visit | Attending: Gastroenterology | Admitting: Gastroenterology

## 2018-04-25 ENCOUNTER — Other Ambulatory Visit: Payer: Self-pay | Admitting: Gastroenterology

## 2018-04-25 DIAGNOSIS — K5901 Slow transit constipation: Secondary | ICD-10-CM

## 2018-05-05 ENCOUNTER — Encounter (HOSPITAL_COMMUNITY): Payer: Self-pay | Admitting: *Deleted

## 2018-05-05 ENCOUNTER — Emergency Department (HOSPITAL_COMMUNITY)
Admission: EM | Admit: 2018-05-05 | Discharge: 2018-05-05 | Disposition: A | Discharge status: Home / Self Care | Payer: Medicare Other | Attending: Emergency Medicine | Admitting: Emergency Medicine

## 2018-05-05 ENCOUNTER — Other Ambulatory Visit: Payer: Self-pay

## 2018-05-05 DIAGNOSIS — I5032 Chronic diastolic (congestive) heart failure: Secondary | ICD-10-CM | POA: Diagnosis not present

## 2018-05-05 DIAGNOSIS — Z87891 Personal history of nicotine dependence: Secondary | ICD-10-CM | POA: Insufficient documentation

## 2018-05-05 DIAGNOSIS — E1122 Type 2 diabetes mellitus with diabetic chronic kidney disease: Secondary | ICD-10-CM | POA: Diagnosis not present

## 2018-05-05 DIAGNOSIS — N182 Chronic kidney disease, stage 2 (mild): Secondary | ICD-10-CM | POA: Diagnosis not present

## 2018-05-05 DIAGNOSIS — I130 Hypertensive heart and chronic kidney disease with heart failure and stage 1 through stage 4 chronic kidney disease, or unspecified chronic kidney disease: Secondary | ICD-10-CM | POA: Diagnosis not present

## 2018-05-05 DIAGNOSIS — I252 Old myocardial infarction: Secondary | ICD-10-CM | POA: Diagnosis not present

## 2018-05-05 DIAGNOSIS — Z466 Encounter for fitting and adjustment of urinary device: Secondary | ICD-10-CM | POA: Diagnosis not present

## 2018-05-05 DIAGNOSIS — Z8673 Personal history of transient ischemic attack (TIA), and cerebral infarction without residual deficits: Secondary | ICD-10-CM | POA: Diagnosis not present

## 2018-05-05 DIAGNOSIS — F0151 Vascular dementia with behavioral disturbance: Secondary | ICD-10-CM | POA: Diagnosis not present

## 2018-05-05 DIAGNOSIS — Z79899 Other long term (current) drug therapy: Secondary | ICD-10-CM | POA: Diagnosis not present

## 2018-05-05 DIAGNOSIS — R338 Other retention of urine: Secondary | ICD-10-CM | POA: Diagnosis not present

## 2018-05-05 DIAGNOSIS — Z951 Presence of aortocoronary bypass graft: Secondary | ICD-10-CM | POA: Diagnosis not present

## 2018-05-05 DIAGNOSIS — Z7901 Long term (current) use of anticoagulants: Secondary | ICD-10-CM | POA: Diagnosis not present

## 2018-05-05 LAB — CBC
HCT: 39.3 % (ref 39.0–52.0)
Hemoglobin: 13 g/dL (ref 13.0–17.0)
MCH: 29.7 pg (ref 26.0–34.0)
MCHC: 33.1 g/dL (ref 30.0–36.0)
MCV: 89.7 fL (ref 78.0–100.0)
PLATELETS: 273 10*3/uL (ref 150–400)
RBC: 4.38 MIL/uL (ref 4.22–5.81)
RDW: 12.8 % (ref 11.5–15.5)
WBC: 7.8 10*3/uL (ref 4.0–10.5)

## 2018-05-05 LAB — URINALYSIS, ROUTINE W REFLEX MICROSCOPIC
Bilirubin Urine: NEGATIVE
GLUCOSE, UA: NEGATIVE mg/dL
Ketones, ur: NEGATIVE mg/dL
Nitrite: POSITIVE — AB
PH: 7 (ref 5.0–8.0)
Protein, ur: 100 mg/dL — AB
RBC / HPF: >50 RBC/hpf — ABNORMAL HIGH (ref 0–5)
SPECIFIC GRAVITY, URINE: 1.013 (ref 1.005–1.030)
WBC, UA: >50 WBC/hpf — ABNORMAL HIGH (ref 0–5)

## 2018-05-05 LAB — COMPREHENSIVE METABOLIC PANEL
ALK PHOS: 81 U/L (ref 38–126)
ALT: 10 U/L (ref 0–44)
AST: 18 U/L (ref 15–41)
Albumin: 3.5 g/dL (ref 3.5–5.0)
Anion gap: 11 (ref 5–15)
BUN: 9 mg/dL (ref 8–23)
CHLORIDE: 99 mmol/L (ref 98–111)
CO2: 21 mmol/L — AB (ref 22–32)
CREATININE: 1.19 mg/dL (ref 0.61–1.24)
Calcium: 8.9 mg/dL (ref 8.9–10.3)
GFR calc non Af Amer: 58 mL/min — ABNORMAL LOW (ref >60)
Glucose, Bld: 138 mg/dL — ABNORMAL HIGH (ref 70–99)
Potassium: 3.6 mmol/L (ref 3.5–5.1)
SODIUM: 131 mmol/L — AB (ref 135–145)
Total Bilirubin: 0.8 mg/dL (ref 0.3–1.2)
Total Protein: 6.4 g/dL — ABNORMAL LOW (ref 6.5–8.1)

## 2018-05-05 LAB — I-STAT CG4 LACTIC ACID, ED: Lactic Acid, Venous: 1.59 mmol/L (ref 0.5–1.9)

## 2018-05-05 MED ORDER — FENTANYL CITRATE (PF) 100 MCG/2ML IJ SOLN
75.0000 ug | Freq: Once | INTRAMUSCULAR | Status: AC
  Administered 2018-05-05: 75 ug via INTRAVENOUS
  Filled 2018-05-05: qty 2

## 2018-05-05 MED ORDER — FENTANYL CITRATE (PF) 100 MCG/2ML IJ SOLN
75.0000 ug | Freq: Once | INTRAMUSCULAR | Status: AC
Start: 2018-05-05 — End: 2018-05-05
  Administered 2018-05-05: 75 ug via INTRAVENOUS
  Filled 2018-05-05: qty 2

## 2018-05-05 MED ORDER — SODIUM CHLORIDE 0.9 % IV SOLN
1.0000 g | Freq: Once | INTRAVENOUS | Status: AC
  Administered 2018-05-05: 1 g via INTRAVENOUS
  Filled 2018-05-05: qty 10

## 2018-05-05 MED ORDER — CEPHALEXIN 500 MG PO CAPS: 500.0000 mg | ORAL_CAPSULE | Freq: Three times a day (TID) | ORAL | 0 refills | Status: DC

## 2018-05-05 MED ORDER — FENTANYL CITRATE (PF) 100 MCG/2ML IJ SOLN
100.0000 ug | Freq: Once | INTRAMUSCULAR | Status: AC
  Administered 2018-05-05: 100 ug via INTRAVENOUS
  Filled 2018-05-05: qty 2

## 2018-05-05 NOTE — ED Notes (Signed)
Report called to charge nurse at [facility]. 

## 2018-05-05 NOTE — ED Notes (Signed)
Urology Provider at bedside.

## 2018-05-05 NOTE — ED Notes (Signed)
Pt noted to be screaming out for pain medication.  Will notify EDP.

## 2018-05-05 NOTE — ED Provider Notes (Signed)
Sent as a transfer from [facility] by Dr. Daun Peacock for urology evaluation.  Dr. Lennox Laity evaluated the patient in the emergency department change of the Foley catheter.  consistant with UTI.  Patient was already given IV Rocephin by Dr. Daun Peacock.  Urology cleared the patient for outpatient management.  Will provide prescription for Keflex.  The patient is safe for discharge with strict return precautions.  Disposition: Discharge  Condition: Good  I have discussed the results, Dx and Tx plan with the patient's family who expressed understanding and agree(s) with the plan. Discharge instructions discussed at great length. The patient's family was given strict return precautions who verbalized understanding of the instructions. No further questions at time of discharge.    ED Discharge Orders         Ordered    cephALEXin (KEFLEX) 500 MG capsule  3 times daily     05/05/18 9518           Follow Up: Shirline Frees, NP [address] [place] [address] [phone]  Schedule an appointment as soon as possible for a visit    Urology  Schedule an appointment as soon as possible for a visit        Verlie Liotta, Amadeo Garnet, MD 05/05/18 [phone]

## 2018-05-05 NOTE — Consult Note (Signed)
History of Present Illness: 77 year old white male with history of BPH, bladder outlet obstruction and urinary retention.  He scheduled for Urolift with Dr. [facility] Morin but his Foley catheter quit draining yesterday.  Nurses in emergency department were having trouble changing it as the patient has dementia and he screams when he is touched.  According to nurse the balloon was deflated and patient screamed so it was reinflated. Also foley would not irrigate by report. He now has some bleeding around the catheter. He was given rocephin in ED. No fevers.   Past Medical History:  Diagnosis Date  . CHF (congestive heart failure) (HCC) 1999  . CKD (chronic kidney disease), stage II   . Coronary artery disease   . Hx of colonic polyps   . Hyperlipidemia   . Hyperplastic colon polyp 04/15/2007  . Hypertension   . Prostatic hypertrophy, benign    with elevated PSA  . Stroke (HCC)   . TIA (transient ischemic attack) 2003   "mini stroke" (03/19/2013)   Past Surgical History:  Procedure Laterality Date  . CARDIAC CATHETERIZATION    . CATARACT EXTRACTION W/ INTRAOCULAR LENS  IMPLANT, BILATERAL Bilateral 1998  . COLONOSCOPY  2008  . CORONARY ARTERY BYPASS GRAFT  1999   x4  . LOOP RECORDER INSERTION N/A 11/05/2017   Procedure: LOOP RECORDER INSERTION;  Surgeon: Hillis Range, MD;  Location: [facility] INVASIVE CV LAB;  Service: Cardiovascular;  Laterality: N/A;  . TEE WITHOUT CARDIOVERSION N/A 11/05/2017   Procedure: TRANSESOPHAGEAL ECHOCARDIOGRAM (TEE) WITH LOOP;  Surgeon: Lars Masson, MD;  Location: [facility] ENDOSCOPY;  Service: Cardiovascular;  Laterality: N/A;    Home Medications:   (Not in a hospital admission) Allergies:  Allergies  Allergen Reactions  . Sulfonamide Derivatives Other (See Comments)    REACTION: Weak,lethargic    Family History  Problem Relation Age of Onset  . Coronary artery disease Mother   . Heart disease Mother   . Stroke Father   . Heart disease Father   . Heart disease  Brother    Social History:  reports that he quit smoking about 5 years ago. His smoking use included cigarettes. He has a 37.50 pack-year smoking history. He has never used smokeless tobacco. He reports that he drinks about 1.0 standard drinks of alcohol per week. He reports that he does not use drugs.  ROS: A complete review of systems was performed.  All systems are negative except for pertinent findings as noted. Review of Systems  All other systems reviewed and are negative.    Physical Exam:  Vital signs in last 24 hours: Temp:  [97.9 F (36.6 C)-98.3 F (36.8 C)] 97.9 F (36.6 C) (09/23 0549) Pulse Rate:  [65-96] 67 (09/23 0549) Resp:  [15] 15 (09/23 0549) BP: (114-193)/(62-103) 150/67 (09/23 0549) SpO2:  [92 %-100 %] 92 % (09/23 0549) Weight:  [83.9 kg] 83.9 kg (09/23 0154) General:  Alert and oriented, No acute distress, but screams when touched (even to wipe / clean his leg) HEENT: Normocephalic, atraumatic Cardiovascular: Regular rate and rhythm Lungs: Regular rate and effort Abdomen: Soft, nontender, nondistended, no abdominal masses Back: No CVA tenderness Extremities: No edema Neurologic: Grossly intact GU: Foley catheter in place, appears to be a little bit too far out.  The balloon was deflated and the Foley removed without difficulty.    Procedure: He was prepped and a 20 [place] coud catheter was advanced without difficulty.  I put 20 cc in the balloon and seated at the bladder neck  and left to gravity drainage.  Urine was clear / yellow. I sent urine for Cx. Drained about 700 ml.   Laboratory Data:  Results for orders placed or performed during the hospital encounter of 05/05/18 (from the past 24 hour(s))  Comprehensive metabolic panel     Status: Abnormal   Collection Time: 05/05/18  1:57 AM  Result Value Ref Range   Sodium 131 (L) 135 - 145 mmol/L   Potassium 3.6 3.5 - 5.1 mmol/L   Chloride 99 98 - 111 mmol/L   CO2 21 (L) 22 - 32 mmol/L   Glucose, Bld  138 (H) 70 - 99 mg/dL   BUN 9 8 - 23 mg/dL   Creatinine, Ser 1.61 0.61 - 1.24 mg/dL   Calcium 8.9 8.9 - 09.6 mg/dL   Total Protein 6.4 (L) 6.5 - 8.1 g/dL   Albumin 3.5 3.5 - 5.0 g/dL   AST 18 15 - 41 U/L   ALT 10 0 - 44 U/L   Alkaline Phosphatase 81 38 - 126 U/L   Total Bilirubin 0.8 0.3 - 1.2 mg/dL   GFR calc non Af Amer 58 (L) >60 mL/min   GFR calc Af Amer >60 >60 mL/min   Anion gap 11 5 - 15  CBC     Status: None   Collection Time: 05/05/18  1:57 AM  Result Value Ref Range   WBC 7.8 4.0 - 10.5 K/uL   RBC 4.38 4.22 - 5.81 MIL/uL   Hemoglobin 13.0 13.0 - 17.0 g/dL   HCT 04.5 40.9 - 81.1 %   MCV 89.7 78.0 - 100.0 fL   MCH 29.7 26.0 - 34.0 pg   MCHC 33.1 30.0 - 36.0 g/dL   RDW 91.4 78.2 - 95.6 %   Platelets 273 150 - 400 K/uL  I-Stat CG4 Lactic Acid, ED     Status: None   Collection Time: 05/05/18  2:03 AM  Result Value Ref Range   Lactic Acid, Venous 1.59 0.5 - 1.9 mmol/L   No results found for this or any previous visit (from the past 240 hour(s)). Creatinine: Recent Labs    05/05/18 0157  CREATININE 1.19    Impression/Assessment/plan:  1) Urinary retention - foley replaced.   2) Urethral bleeding-possibly from urethral trauma in the prostatic urethra.  Should be self-limiting.  3) BPH - pt scheduled for Urolift procedure with Dr. [facility] Morin.   Pt stable for discharge. I send a note to Dr. Alvester Morin letting him know catheter was changed and culture sent.   Jerilee Field 05/05/2018, 6:57 AM

## 2018-05-05 NOTE — ED Notes (Signed)
[phone] number to call report at [facility] ED

## 2018-05-05 NOTE — ED Provider Notes (Signed)
[name] EMERGENCY DEPARTMENT Provider Note   CSN: [id number] Arrival date & time: 05/05/18  0139     History   Chief Complaint Chief Complaint  Patient presents with  . catheter not draining    HPI Thomas Eden. is a 77 y.o. male.  The history is provided by the spouse.  Illness  This is a recurrent problem. The current episode started 12 to 24 hours ago. The problem occurs constantly. The problem has not changed since onset.Associated symptoms include abdominal pain. Pertinent negatives include no chest pain, no headaches and no shortness of breath. Nothing aggravates the symptoms. Nothing relieves the symptoms. He has tried nothing for the symptoms. The treatment provided no relief.  Patient with a h/o prostatic hypertrophy with indwelling foley scheduled for a cystoscopy and insertion of urolift on 10/14.  Last foley change was at [facility] on 8/29.  Patient has not had any UOP since noon on Sunday.  No f/c/r.    Past Medical History:  Diagnosis Date  . CHF (congestive heart failure) (HCC) 1999  . CKD (chronic kidney disease), stage II   . Coronary artery disease   . Hx of colonic polyps   . Hyperlipidemia   . Hyperplastic colon polyp 04/15/2007  . Hypertension   . Prostatic hypertrophy, benign    with elevated PSA  . Stroke (HCC)   . TIA (transient ischemic attack) 2003   "mini stroke" (03/19/2013)    Patient Active Problem List   Diagnosis Date Noted  . Delirium 01/26/2018  . 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 Foul smelling urine   . Leukocytosis   . Hyponatremia   . 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 Benign essential HTN   . Coronary artery disease involving native coronary artery of native heart without angina pectoris   . Stage 3 chronic kidney disease (HCC)   . Acute lower UTI   . Seasonal allergies   . Stroke (HCC) 11/04/2017  . HLD (hyperlipidemia) 11/04/2017  . Chronic diastolic CHF  (congestive heart failure) (HCC) 11/04/2017  . Acute renal failure superimposed on stage 2 chronic kidney disease (HCC) 11/04/2017  . Acute metabolic encephalopathy 11/04/2017  . UTI (urinary tract infection) 11/04/2017  . AKI (acute kidney injury) (HCC)   . History of stroke   . Prediabetes   . Left hemiparesis (HCC) 03/19/2013  . Cerebral infarction (HCC) 03/19/2013  . Left leg weakness 03/19/2013  . CAD, ARTERY BYPASS GRAFT 09/30/2008  . 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 Borderline hyperglycemia 12/03/2007  . Other and unspecified hyperlipidemia 07/28/2007  . Essential hypertension 07/28/2007  . 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 Coronary atherosclerosis 07/28/2007  . BENIGN PROSTATIC HYPERTROPHY 07/28/2007  . COLONIC POLYPS, 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    Past Surgical History:  Procedure Laterality Date  . CARDIAC CATHETERIZATION    . CATARACT EXTRACTION W/ INTRAOCULAR LENS  IMPLANT, BILATERAL Bilateral 1998  . COLONOSCOPY  2008  . CORONARY ARTERY BYPASS GRAFT  1999   x4  . LOOP RECORDER INSERTION N/A 11/05/2017   Procedure: LOOP RECORDER INSERTION;  Surgeon: Hillis Range, MD;  Location: [facility] INVASIVE CV LAB;  Service: Cardiovascular;  Laterality: N/A;  . TEE WITHOUT CARDIOVERSION N/A 11/05/2017   Procedure: TRANSESOPHAGEAL ECHOCARDIOGRAM (TEE) WITH LOOP;  Surgeon: Lars Masson, MD;  Location: [facility] ENDOSCOPY;  Service: Cardiovascular;  Laterality: N/A;        Home Medications    Prior to Admission medications   Medication  Sig Start Date End Date Taking? Authorizing Provider  acetaminophen (TYLENOL) 325 MG tablet Take 1-2 tablets (325-650 mg total) by mouth every 4 (four) hours as needed for mild pain. 11/19/17   Love, Evlyn Kanner, PA-C  atorvastatin (LIPITOR) 80 MG tablet Take 1 tablet (80 mg total) by mouth daily. 11/06/17   Joseph Art, DO  ciprofloxacin (CIPRO) 500 MG tablet Take 1 tablet (500 mg total) by mouth 2 (two) times daily. One po bid x 7 days 02/06/18   Yeng Perz,  Gretna Bergin, MD  clopidogrel (PLAVIX) 75 MG tablet TAKE 1 TABLET BY MOUTH  DAILY WITH BREAKFAST 06/10/17   Wendall Stade, MD  finasteride (PROSCAR) 5 MG tablet Take 5 mg by mouth daily.    [provider]  Flax OIL Take 1 capsule by mouth daily.      [provider]  fluticasone (FLONASE) 50 MCG/ACT nasal spray Place 2 sprays into both nostrils daily as needed for allergies or rhinitis. 11/19/17   Love, Evlyn Kanner, PA-C  folic acid (FOLVITE) 1 MG tablet TAKE 1 TABLET BY MOUTH  EVERY DAY 02/03/18   Wendall Stade, MD  hydrocerin (EUCERIN) CREA Apply 1 application topically 2 (two) times daily. 11/19/17   Love, Evlyn Kanner, PA-C  ketoconazole (NIZORAL) 2 % cream Apply 1 application topically 2 (two) times daily. 11/25/17   Nelwyn Salisbury, MD  loratadine (CLARITIN) 10 MG tablet Take 10 mg by mouth at bedtime.     [provider]  metoprolol tartrate (LOPRESSOR) 50 MG tablet TAKE 1 TABLET BY MOUTH TWO  TIMES DAILY 03/03/18   Wendall Stade, MD  Omega-3 Fatty Acids (FISH OIL) 1000 MG CAPS Take 1 capsule by mouth daily.      [provider]  tamsulosin (FLOMAX) 0.4 MG CAPS capsule Take 1 capsule (0.4 mg total) by mouth daily after supper. 01/21/18   Ranelle Oyster, MD    Family History Family History  Problem Relation Age of Onset  . Coronary artery disease Mother   . Heart disease Mother   . Stroke Father   . Heart disease Father   . Heart disease Brother     Social History Social History   Tobacco Use  . Smoking status: Former Smoker    Packs/day: 0.75    Years: 50.00    Pack years: 37.50    Types: Cigarettes    Last attempt to quit: 03/19/2013    Years since quitting: 5.1  . Smokeless tobacco: Never Used  Substance Use Topics  . Alcohol use: Yes    Alcohol/week: 1.0 standard drinks    Types: 1 Cans of beer per week    Comment: 03/19/2013 "1 beer/week" does not drink anymore  . Drug use: No     Allergies   Sulfonamide derivatives   Review of  Systems Review of Systems  Constitutional: Negative for fever.  Respiratory: Negative for shortness of breath.   Cardiovascular: Negative for chest pain.  Gastrointestinal: Positive for abdominal distention and abdominal pain.  Genitourinary: Positive for difficulty urinating.  Neurological: Negative for headaches.  All other systems reviewed and are negative.    Physical Exam Updated Vital Signs BP 126/62 (BP Location: Right Arm)   Pulse 71   Temp 98.3 F (36.8 C) (Oral)   Resp 15   Ht 5\' 10"  (1.778 m)   Wt 83.9 kg   SpO2 97%   BMI 26.54 kg/m   Physical Exam  Constitutional: He appears well-developed and well-nourished.  HENT:  Head: Normocephalic and atraumatic.  Mouth/Throat: No oropharyngeal exudate.  Eyes: Conjunctivae and EOM are normal.  Neck: Normal range of motion. Neck supple.  Cardiovascular: Normal rate, regular rhythm, normal heart sounds and intact distal pulses.  Pulmonary/Chest: Effort normal and breath sounds normal. No stridor. He has no wheezes.  Abdominal: Soft. Bowel sounds are normal. He exhibits distension. No hernia.  Musculoskeletal: Normal range of motion.  Neurological: He is alert.  Skin: Skin is warm and dry. Capillary refill takes less than 2 seconds. He is not diaphoretic.     ED Treatments / Results  Labs (all labs ordered are listed, but only abnormal results are displayed) Results for orders placed or performed during the hospital encounter of 05/05/18  Comprehensive metabolic panel  Result Value Ref Range   Sodium 131 (L) 135 - 145 mmol/L   Potassium 3.6 3.5 - 5.1 mmol/L   Chloride 99 98 - 111 mmol/L   CO2 21 (L) 22 - 32 mmol/L   Glucose, Bld 138 (H) 70 - 99 mg/dL   BUN 9 8 - 23 mg/dL   Creatinine, Ser 7.74 0.61 - 1.24 mg/dL   Calcium 8.9 8.9 - 12.8 mg/dL   Total Protein 6.4 (L) 6.5 - 8.1 g/dL   Albumin 3.5 3.5 - 5.0 g/dL   AST 18 15 - 41 U/L   ALT 10 0 - 44 U/L   Alkaline Phosphatase 81 38 - 126 U/L   Total Bilirubin 0.8  0.3 - 1.2 mg/dL   GFR calc non Af Amer 58 (L) >60 mL/min   GFR calc Af Amer >60 >60 mL/min   Anion gap 11 5 - 15  CBC  Result Value Ref Range   WBC 7.8 4.0 - 10.5 K/uL   RBC 4.38 4.22 - 5.81 MIL/uL   Hemoglobin 13.0 13.0 - 17.0 g/dL   HCT 78.6 76.7 - 20.9 %   MCV 89.7 78.0 - 100.0 fL   MCH 29.7 26.0 - 34.0 pg   MCHC 33.1 30.0 - 36.0 g/dL   RDW 47.0 96.2 - 83.6 %   Platelets 273 150 - 400 K/uL  I-Stat CG4 Lactic Acid, ED  Result Value Ref Range   Lactic Acid, Venous 1.59 0.5 - 1.9 mmol/L   Dg Abd 2 Views  Result Date: 04/25/2018 CLINICAL DATA:  Constipation 2 months. EXAM: ABDOMEN - 2 VIEW COMPARISON:  Chest x-ray 01/26/2018 FINDINGS: Bowel gas pattern is nonobstructive with mild fecal retention over the colon most prominent over the rectum. No free peritoneal air. There are surgical clips over the left upper quadrant. Calcified plaque over the abdominal aorta. Degenerative change of the spine and hips. Loop recorder over the lower left chest wall and evidence of previous median sternotomy. IMPRESSION: Nonobstructive bowel gas pattern with mild fecal retention throughout the colon most prominent over the rectum. Electronically Signed   By: Elberta Fortis M.D.   On: 04/25/2018 16:35     Procedures Procedures (including critical care time)  Medications Ordered in ED Medications  cefTRIAXone (ROCEPHIN) 1 g in sodium chloride 0.9 % 100 mL IVPB (has no administration in time range)  fentaNYL (SUBLIMAZE) injection 100 mcg (100 mcg Intravenous Given 05/05/18 0429)    Case d/w Dr. Mena Goes who will seen patient at [facility]   Final Clinical Impressions(s) / ED Diagnoses   Final diagnoses:  Acute urinary retention    Will transfer to [facility] for exchange of catheter.     Marlos Carmen, MD 05/05/18 [phone]

## 2018-05-05 NOTE — ED Notes (Signed)
Wife states patient is scheduled for on prostate 10/4. States he was seen by Dr. Alvester Morin on Monday and started on probiotic and cranberry pills, wife states patient hasn't had urine in his bag since approx. 11am yest. Foul drainage coming around penis.

## 2018-05-05 NOTE — ED Notes (Signed)
[facility] called for transport. 

## 2018-05-05 NOTE — ED Triage Notes (Signed)
The pt has had a foley catheter placed 6 weeks ago  He started having pain and felt like his catheter was not draining.  C/o intermittent pain.  No problem with the catheter until tonight  No drainage since 2030 according to the wife.  Thick drainage from the catheter

## 2018-05-07 ENCOUNTER — Other Ambulatory Visit: Payer: Self-pay | Admitting: *Deleted

## 2018-05-07 LAB — URINE CULTURE: Special Requests: NORMAL

## 2018-05-07 MED ORDER — CIPROFLOXACIN HCL 500 MG PO TABS: 500.0000 mg | ORAL_TABLET | Freq: Two times a day (BID) | ORAL | 0 refills | Status: DC

## 2018-05-08 ENCOUNTER — Telehealth: Payer: Self-pay | Admitting: *Deleted

## 2018-05-08 NOTE — Telephone Encounter (Signed)
Post ED Visit - Positive Culture Follow-up: Unsuccessful Patient Follow-up  Culture assessed and recommendations reviewed by:  []  Enzo Bi, Pharm.D. []  Celedonio Miyamoto, Pharm.D., BCPS AQ-ID []  Garvin Fila, Pharm.D., BCPS []  Georgina Pillion, Pharm.D., BCPS []  [place], [place].D., BCPS, AAHIVP []  Estella Husk, Pharm.D., BCPS, AAHIVP []  Sherlynn Carbon, PharmD []  Pollyann Samples, PharmD, BCPS Lysle Pearl, PharmD  Positive urine culture  []  Patient discharged without antimicrobial prescription and treatment is now indicated [x]  Organism is resistant to prescribed ED discharge antimicrobial []  Patient with positive blood cultures  Plan:  D/C Cephalexin, Start Macrobid 100mg  PO BID x 7 days  Unable to contact patient after 3 attempts, letter will be sent to address on file  Lysle Pearl 05/08/2018, 10:19 AM

## 2018-05-08 NOTE — Progress Notes (Signed)
ED Antimicrobial Stewardship Positive Culture Follow Up   Thomas [name]. is an 77 y.o. male who presented to [facility] on 05/05/2018 with a chief complaint of  Chief Complaint  Patient presents with  . catheter not draining    Recent Results (from the past 720 hour(s))  Urine culture     Status: Abnormal   Collection Time: 05/05/18  6:46 AM  Result Value Ref Range Status   Specimen Description   Final    URINE, CATHETERIZED Performed at [facility], [address], [place], [place] [address]    Special Requests   Final    Rocephin Normal Performed at [facility], [address], [place], [place] [address]    Culture >=100,000 COLONIES/mL CITROBACTER FREUNDII (A)  Final   Report Status 05/07/2018 FINAL  Final   Organism ID, Bacteria CITROBACTER FREUNDII (A)  Final      Susceptibility   Citrobacter freundii - MIC*    CEFAZOLIN >=64 RESISTANT Resistant     CEFTRIAXONE >=64 RESISTANT Resistant     CIPROFLOXACIN 1 SENSITIVE Sensitive     GENTAMICIN <=1 SENSITIVE Sensitive     IMIPENEM 1 SENSITIVE Sensitive     NITROFURANTOIN <=16 SENSITIVE Sensitive     TRIMETH/SULFA <=20 SENSITIVE Sensitive     PIP/TAZO 64 INTERMEDIATE Intermediate     * >=100,000 COLONIES/mL CITROBACTER FREUNDII    [x]  Treated with cephalexin, organism resistant to prescribed antimicrobial []  Patient discharged originally without antimicrobial agent and treatment is now indicated  New antibiotic prescription: DC cephalexin, start macrobid 100mg  PO BID x 7 days  ED Provider: Charlestine Night, PA   Ajla Mcgeachy, Drake Leach 05/08/2018, 9:56 AM Clinical Pharmacist Monday - Friday phone -  [phone] Saturday - Sunday phone - [phone]

## 2018-05-09 LAB — CUP PACEART REMOTE DEVICE CHECK
Date Time Interrogation Session: 20190904153618
MDC IDC PG IMPLANT DT: 20190326

## 2018-05-14 ENCOUNTER — Telehealth: Payer: Self-pay | Admitting: Adult Health

## 2018-05-14 NOTE — Telephone Encounter (Unsigned)
Copied from CRM [id number]. Topic: Quick Communication - See Telephone Encounter >> May 14, 2018  4:18 PM Floria Raveling A wrote: CRM for notification. See Telephone encounter for: 05/14/18. Pt wife called in and stated that pt is having some pain at the top of his Bottom and she would like to know what [employer] .  Would he like for her to bring him in?  Please advise   Best number  [phone]

## 2018-05-15 NOTE — Telephone Encounter (Signed)
Called and spoke to the pt's wife.  She notified me that the pt has 2 small bed sores on his buttocks.  One is the size of a dime.  She denied any oozing or foul odor.  Pt has not been running a fever.  Is complaining of pain.  Would like to be seen.  Informed her that [name] is out of the office tomorrow.  Requested to see Dr. Clent Ridges.  Scheduled on 05/16/18 @ 1:30 for a 30 minute appt.  Will forward as a FYI.

## 2018-05-15 NOTE — Telephone Encounter (Signed)
Copied from CRM [id number]. Topic: General - Other >> May 15, 2018  3:44 PM Mcneil, Ja-Kwan wrote: Reason for CRM:  Pt wife called in again stating that pt is having pain at the top of his bottom and she would like to know what [name] suggests. Requests call back. Cb# [phone]

## 2018-05-16 ENCOUNTER — Other Ambulatory Visit: Payer: Self-pay | Admitting: Urology

## 2018-05-16 ENCOUNTER — Encounter: Payer: Self-pay | Admitting: Family Medicine

## 2018-05-16 ENCOUNTER — Telehealth: Payer: Self-pay

## 2018-05-16 ENCOUNTER — Ambulatory Visit: Payer: Medicare Other | Admitting: Family Medicine

## 2018-05-16 VITALS — BP 120/72 | HR 66 | Temp 97.7°F | Wt 151.0 lb

## 2018-05-16 DIAGNOSIS — L89151 Pressure ulcer of sacral region, stage 1: Secondary | ICD-10-CM

## 2018-05-16 MED ORDER — SILVER SULFADIAZINE 1 % EX CREA: 1.0000 "application " | TOPICAL_CREAM | Freq: Every day | CUTANEOUS | 0 refills | Status: DC

## 2018-05-16 NOTE — Progress Notes (Signed)
   Subjective:    Patient ID: [name]., male    DOB: [birth year], 77 y.o.   MRN: [id number]  HPI Here with his wife to check a small ulcer on the tailbone that they noticed one week ago. It is somewhat painful. He has never had a pressure sore before. He is wheelchair bound after a stroke.    Review of Systems  Constitutional: Negative.   Respiratory: Negative.   Cardiovascular: Negative.   Skin: Positive for wound.       Objective:   Physical Exam  Constitutional: He appears well-developed and well-nourished.  Cardiovascular: Normal rate, regular rhythm, normal heart sounds and intact distal pulses.  Pulmonary/Chest: Effort normal and breath sounds normal.  Skin:  There is a stage 1 decubitus ulcer on the sacrum which is 1 cm in diameter          Assessment & Plan:  Sacral decubitus. Treat with Silvadene and gauze daily. He already has a cushion for the wheelchair, but I also suggested they lay an egg crate cushion on his bed. Recheck prn. Gershon Crane, MD

## 2018-05-16 NOTE — Progress Notes (Addendum)
  05-05-18  (Epic) CBC, BMP  04-18-18 (Epic) Cardiac Clearance in Telephone Encounter Dr. Eden Emms  01-26-18 (Epic) EKG, CXR  11-05-17 ( Epic) ECHO

## 2018-05-16 NOTE — Patient Instructions (Addendum)
Thomas HALBROOKS Jr.  05/16/2018   Your procedure is scheduled on: 05-26-18     Report to [facility] Main  Entrance    Report to Admitting at 5:30 AM    Call this number if you have problems the morning of surgery [phone]     Remember: Do not eat food or drink liquids :After Midnight.    Take these medicines the morning of surgery with A SIP OF WATER: Atorvastatin (Lipitor), and Metoprolol Tartrate (Lopresssor). You may also bring and use your nasal spray as needed.             BRUSH YOUR TEETH MORNING OF SURGERY AND RINSE YOUR MOUTH OUT, NO CHEWING GUM CANDY OR MINTS.                      You may not have any metal on your body including hair pins and              piercings  Do not wear jewelry, lotions, powders, cologne or deodorant             Men may shave face and neck.   Do not bring valuables to the hospital. [place] IS NOT             RESPONSIBLE   FOR VALUABLES.  Contacts, dentures or bridgework may not be worn into surgery.  .    Patients discharged the day of surgery will not be allowed to drive home.  Name and phone number of your driver: Garnet Zamorski [phone]               Please read over the following fact sheets you were given: _____________________________________________________________________             [facility] - Preparing for Surgery Before surgery, you can play an important role.  Because skin is not sterile, your skin needs to be as free of germs as possible.  You can reduce the number of germs on your skin by washing with CHG (chlorahexidine gluconate) soap before surgery.  CHG is an antiseptic cleaner which kills germs and bonds with the skin to continue killing germs even after washing. Please DO NOT use if you have an allergy to CHG or antibacterial soaps.  If your skin becomes reddened/irritated stop using the CHG and inform your nurse when you arrive at Short Stay. Do not shave (including legs and  underarms) for at least 48 hours prior to the first CHG shower.  You may shave your face/neck. Please follow these instructions carefully:  1.  Shower with CHG Soap the night before surgery and the  morning of Surgery.  2.  If you choose to wash your hair, wash your hair first as usual with your  normal  shampoo.  3.  After you shampoo, rinse your hair and body thoroughly to remove the  shampoo.                           4.  Use CHG as you would any other liquid soap.  You can apply chg directly  to the skin and wash                       Gently with a scrungie or clean washcloth.  5.  Apply the CHG Soap to  your body ONLY FROM THE NECK DOWN.   Do not use on face/ open                           Wound or open sores. Avoid contact with eyes, ears mouth and genitals (private parts).                       Wash face,  Genitals (private parts) with your normal soap.             6.  Wash thoroughly, paying special attention to the area where your surgery  will be performed.  7.  Thoroughly rinse your body with warm water from the neck down.  8.  DO NOT shower/wash with your normal soap after using and rinsing off  the CHG Soap.                9.  Pat yourself dry with a clean towel.            10.  Wear clean pajamas.            11.  Place clean sheets on your bed the night of your first shower and do not  sleep with pets. Day of Surgery : Do not apply any lotions/deodorants the morning of surgery.  Please wear clean clothes to the hospital/surgery center.  FAILURE TO FOLLOW THESE INSTRUCTIONS MAY RESULT IN THE CANCELLATION OF YOUR SURGERY PATIENT SIGNATURE_________________________________  NURSE SIGNATURE__________________________________  ________________________________________________________________________

## 2018-05-16 NOTE — Telephone Encounter (Signed)
Pt wife returned call from letter to home re: UC done [facility] 05/05/18 . PCP has already changed abx and finished the course

## 2018-05-19 ENCOUNTER — Ambulatory Visit (INDEPENDENT_AMBULATORY_CARE_PROVIDER_SITE_OTHER): Payer: Medicare Other | Admitting: *Deleted

## 2018-05-19 DIAGNOSIS — I6340 Cerebral infarction due to embolism of unspecified cerebral artery: Secondary | ICD-10-CM | POA: Diagnosis not present

## 2018-05-20 ENCOUNTER — Other Ambulatory Visit: Payer: Self-pay

## 2018-05-20 ENCOUNTER — Encounter (HOSPITAL_COMMUNITY): Payer: Self-pay

## 2018-05-20 ENCOUNTER — Encounter (HOSPITAL_COMMUNITY)
Admission: RE | Admit: 2018-05-20 | Discharge: 2018-05-20 | Disposition: A | Discharge status: Home / Self Care | Payer: Medicare Other | Source: Ambulatory Visit | Attending: Urology | Admitting: Urology

## 2018-05-20 DIAGNOSIS — Z01812 Encounter for preprocedural laboratory examination: Secondary | ICD-10-CM | POA: Diagnosis present

## 2018-05-20 NOTE — Progress Notes (Signed)
Carelink Summary Report / Loop Recorder 

## 2018-05-25 NOTE — Anesthesia Preprocedure Evaluation (Addendum)
Anesthesia Evaluation  Patient identified by MRN, date of birth, ID band Patient awake    Reviewed: Allergy & Precautions, H&P , NPO status , Patient's Chart, lab work & pertinent test results, reviewed documented beta blocker date and time   Airway Mallampati: II  TM Distance: >3 FB Neck ROM: full    Dental no notable dental hx. (+) Edentulous Upper, Edentulous Lower, Upper Dentures, Lower Dentures, Implants,    Pulmonary neg pulmonary ROS, former smoker,    Pulmonary exam normal breath sounds clear to auscultation       Cardiovascular Exercise Tolerance: Good hypertension, Pt. on medications + CAD  negative cardio ROS   Rhythm:regular Rate:Normal  TEE 3/19 Systolic function was normal. The estimated   ejection fraction was in the range of 55% to 60%. Wall motion was   normal; there were no regional wall motion abnormalities.   Neuro/Psych PSYCHIATRIC DISORDERS Dementia HEAD CT 6/19 1. No acute intracranial abnormality identified. 2. Stable advanced chronic microvascular ischemic changes and parenchymal volume loss of the brain. Multiple stable chronic 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 distribution infarct.  TIACVA, Residual Symptoms    GI/Hepatic negative GI ROS, Neg liver ROS,   Endo/Other  negative endocrine ROS  Renal/GU CRFRenal disease  negative genitourinary   Musculoskeletal negative musculoskeletal ROS (+)   Abdominal   Peds negative pediatric ROS (+)  Hematology negative hematology ROS (+)   Anesthesia Other Findings   Reproductive/Obstetrics negative OB ROS                          Anesthesia Physical Anesthesia Plan  ASA: III  Anesthesia Plan: General   Post-op Pain Management:    Induction: Intravenous  PONV Risk Score and Plan: 2 and Ondansetron and Treatment may vary due to age or medical condition  Airway Management Planned:  LMA  Additional Equipment:   Intra-op Plan:   Post-operative Plan: Extubation in OR  Informed Consent: I have reviewed the patients History and Physical, chart, labs and discussed the procedure including the risks, benefits and alternatives for the proposed anesthesia with the patient or authorized representative who has indicated his/her understanding and acceptance.   Dental Advisory Given  Plan Discussed with: CRNA, Surgeon and Anesthesiologist  Anesthesia Plan Comments: ( )       Anesthesia Quick Evaluation

## 2018-05-26 ENCOUNTER — Ambulatory Visit (HOSPITAL_COMMUNITY)
Admission: RE | Admit: 2018-05-26 | Discharge: 2018-05-26 | Disposition: A | Discharge status: Home / Self Care | Payer: Medicare Other | Source: Other Acute Inpatient Hospital | Attending: Urology | Admitting: Urology

## 2018-05-26 ENCOUNTER — Encounter (HOSPITAL_COMMUNITY): Payer: Self-pay | Admitting: Emergency Medicine

## 2018-05-26 ENCOUNTER — Ambulatory Visit (HOSPITAL_COMMUNITY): Payer: Medicare Other | Admitting: Anesthesiology

## 2018-05-26 ENCOUNTER — Encounter (HOSPITAL_COMMUNITY)
Admission: RE | Disposition: A | Discharge status: Home / Self Care | Payer: Self-pay | Source: Other Acute Inpatient Hospital | Attending: Urology

## 2018-05-26 DIAGNOSIS — N401 Enlarged prostate with lower urinary tract symptoms: Secondary | ICD-10-CM | POA: Diagnosis not present

## 2018-05-26 DIAGNOSIS — F0390 Unspecified dementia without behavioral disturbance: Secondary | ICD-10-CM | POA: Insufficient documentation

## 2018-05-26 DIAGNOSIS — Z8249 Family history of ischemic heart disease and other diseases of the circulatory system: Secondary | ICD-10-CM | POA: Insufficient documentation

## 2018-05-26 DIAGNOSIS — I130 Hypertensive heart and chronic kidney disease with heart failure and stage 1 through stage 4 chronic kidney disease, or unspecified chronic kidney disease: Secondary | ICD-10-CM | POA: Diagnosis not present

## 2018-05-26 DIAGNOSIS — I509 Heart failure, unspecified: Secondary | ICD-10-CM | POA: Diagnosis not present

## 2018-05-26 DIAGNOSIS — Z87891 Personal history of nicotine dependence: Secondary | ICD-10-CM | POA: Diagnosis not present

## 2018-05-26 DIAGNOSIS — N182 Chronic kidney disease, stage 2 (mild): Secondary | ICD-10-CM | POA: Insufficient documentation

## 2018-05-26 DIAGNOSIS — Z86010 Personal history of colonic polyps: Secondary | ICD-10-CM | POA: Insufficient documentation

## 2018-05-26 DIAGNOSIS — E785 Hyperlipidemia, unspecified: Secondary | ICD-10-CM | POA: Diagnosis not present

## 2018-05-26 DIAGNOSIS — N138 Other obstructive and reflux uropathy: Secondary | ICD-10-CM | POA: Insufficient documentation

## 2018-05-26 DIAGNOSIS — Z882 Allergy status to sulfonamides status: Secondary | ICD-10-CM | POA: Diagnosis not present

## 2018-05-26 DIAGNOSIS — Z951 Presence of aortocoronary bypass graft: Secondary | ICD-10-CM | POA: Diagnosis not present

## 2018-05-26 DIAGNOSIS — I2510 Atherosclerotic heart disease of native coronary artery without angina pectoris: Secondary | ICD-10-CM | POA: Diagnosis not present

## 2018-05-26 DIAGNOSIS — Z8673 Personal history of transient ischemic attack (TIA), and cerebral infarction without residual deficits: Secondary | ICD-10-CM | POA: Diagnosis not present

## 2018-05-26 DIAGNOSIS — Z7902 Long term (current) use of antithrombotics/antiplatelets: Secondary | ICD-10-CM | POA: Diagnosis not present

## 2018-05-26 DIAGNOSIS — Z79899 Other long term (current) drug therapy: Secondary | ICD-10-CM | POA: Insufficient documentation

## 2018-05-26 DIAGNOSIS — N320 Bladder-neck obstruction: Secondary | ICD-10-CM | POA: Diagnosis not present

## 2018-05-26 HISTORY — PX: CYSTOSCOPY WITH INSERTION OF UROLIFT: SHX6678

## 2018-05-26 SURGERY — Surgical Case
Anesthesia: *Unknown

## 2018-05-26 SURGERY — CYSTOSCOPY WITH INSERTION OF UROLIFT
Anesthesia: General

## 2018-05-26 MED ORDER — LIDOCAINE 2% (20 MG/ML) 5 ML SYRINGE
INTRAMUSCULAR | Status: AC
  Filled 2018-05-26: qty 5

## 2018-05-26 MED ORDER — LACTATED RINGERS IV SOLN
INTRAVENOUS | Status: DC
  Administered 2018-05-26: 07:00:00 via INTRAVENOUS

## 2018-05-26 MED ORDER — LIDOCAINE 2% (20 MG/ML) 5 ML SYRINGE
INTRAMUSCULAR | Status: DC | PRN
  Administered 2018-05-26: 100 mg via INTRAVENOUS

## 2018-05-26 MED ORDER — STERILE WATER FOR IRRIGATION IR SOLN
Status: DC | PRN
  Administered 2018-05-26: 1000 mL

## 2018-05-26 MED ORDER — CEFAZOLIN SODIUM-DEXTROSE 2-4 GM/100ML-% IV SOLN
2.0000 g | Freq: Once | INTRAVENOUS | Status: AC
  Administered 2018-05-26: 2 g via INTRAVENOUS
  Filled 2018-05-26: qty 100

## 2018-05-26 MED ORDER — ONDANSETRON HCL 4 MG/2ML IJ SOLN
INTRAMUSCULAR | Status: DC | PRN
  Administered 2018-05-26: 4 mg via INTRAVENOUS

## 2018-05-26 MED ORDER — PROPOFOL 10 MG/ML IV BOLUS
INTRAVENOUS | Status: DC | PRN
  Administered 2018-05-26: 130 mg via INTRAVENOUS

## 2018-05-26 MED ORDER — SODIUM CHLORIDE 0.9 % IR SOLN
Status: DC | PRN
  Administered 2018-05-26: 3000 mL via INTRAVESICAL

## 2018-05-26 MED ORDER — PROPOFOL 10 MG/ML IV BOLUS
INTRAVENOUS | Status: AC
  Filled 2018-05-26: qty 20

## 2018-05-26 MED ORDER — FENTANYL CITRATE (PF) 100 MCG/2ML IJ SOLN
INTRAMUSCULAR | Status: AC
  Filled 2018-05-26: qty 2

## 2018-05-26 MED ORDER — FENTANYL CITRATE (PF) 100 MCG/2ML IJ SOLN
INTRAMUSCULAR | Status: DC | PRN
  Administered 2018-05-26: 25 ug via INTRAVENOUS

## 2018-05-26 MED ORDER — MEPERIDINE HCL 50 MG/ML IJ SOLN: 6.2500 mg | INTRAMUSCULAR | Status: DC | PRN

## 2018-05-26 MED ORDER — GLYCOPYRROLATE PF 0.2 MG/ML IJ SOSY
PREFILLED_SYRINGE | INTRAMUSCULAR | Status: DC | PRN
  Administered 2018-05-26: .2 mg via INTRAVENOUS

## 2018-05-26 MED ORDER — EPHEDRINE SULFATE-NACL 50-0.9 MG/10ML-% IV SOSY
PREFILLED_SYRINGE | INTRAVENOUS | Status: DC | PRN
  Administered 2018-05-26: 15 mg via INTRAVENOUS
  Administered 2018-05-26: 10 mg via INTRAVENOUS
  Administered 2018-05-26: 25 mg via INTRAVENOUS

## 2018-05-26 MED ORDER — FENTANYL CITRATE (PF) 100 MCG/2ML IJ SOLN
25.0000 ug | INTRAMUSCULAR | Status: DC | PRN
  Administered 2018-05-26: 50 ug via INTRAVENOUS

## 2018-05-26 SURGICAL SUPPLY — 11 items
BAG URINE DRAINAGE (UROLOGICAL SUPPLIES) ×2 IMPLANT
BAG URO CATCHER STRL LF (MISCELLANEOUS) ×2 IMPLANT
CATH FOLEY 2WAY SLVR  5CC 16FR (CATHETERS) ×1
CATH FOLEY 2WAY SLVR 5CC 16FR (CATHETERS) ×1 IMPLANT
COVER WAND RF STERILE (DRAPES) IMPLANT
GLOVE BIO SURGEON STRL SZ8 (GLOVE) ×2 IMPLANT
GOWN STRL REUS W/TWL XL LVL3 (GOWN DISPOSABLE) ×4 IMPLANT
MANIFOLD NEPTUNE II (INSTRUMENTS) ×2 IMPLANT
PACK CYSTO (CUSTOM PROCEDURE TRAY) ×2 IMPLANT
SYSTEM UROLIFT (Male Continence) ×6 IMPLANT
TUBING CONNECTING 10 (TUBING) ×2 IMPLANT

## 2018-05-26 NOTE — Op Note (Signed)
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  PROCEDURE: Cystoscopy with implantation of UroLift devices, 6 implants.  SURGEON: Modena Slater, M.D.  ANESTHESIA: General  SPECIMEN: None.  DRAINS: A 20-French Foley catheter.  BLOOD LOSS: Minimal.  COMPLICATIONS: None.  INDICATIONS:The Patient is an 77 year old white male with BPH and bladder outlet obstruction with urinary retention. He has failed medical therapy and has elected UroLift for definitive treatment.  FINDINGS OF PROCEDURE: He was taken to the operating room where a genral anesthetic was induced. He was placed in lithotomy position and was fitted with PAS hose. His perineum and genitalia were prepped with chlorhexidine, and he was draped in usual sterile fashion.  Cystoscopy was performed using the UroLift scope and 0 degree lens. Examination revealed a normal urethra. The external sphincter was intact. Prostatic urethra was approximately 3 cm in length with lateral lobe enlargement. Inspection of bladder revealed no tumors, stones, or inflammation. Ureteral orifices were in their normal anatomic position.  After initial cystoscopy, the visual obturator was replaced with the first UroLift device. This was turned to the 9 o'clock position and pulled back to the veru and then slightly advanced. Pressure was then applied to the right lateral lobe and the UroLift device was deployed.  The second UroLift device was then inserted and applied to the left lateral lobe at 3 o'clock and deployed in the mid prostatic urethra. After this, there was still some apparent obstruction closer to the bladder neck. So a second level of UroLift device was applied between the mid urethra and the proximal urethra providing further patency to the  prostatic urethra. There was still some obstruction at the level of the mid prostatic urethra therefore 2 more implants were deployed on each side.  By this point, there was excellent relief of the obstruction and I open channel.  The scope was removed and a 20-French Foley catheter was inserted without difficulty. The balloon was filled with 10 mL sterile fluid, and the catheter was placed to straight drainage.  COMPLICATIONS: None   CONDITION: Stable, extubated, transferred to PACU  PLAN: The patient will be discharged home and followup in several days for a voiding trial.

## 2018-05-26 NOTE — Discharge Instructions (Addendum)
Removal of catheter   1.  Expect to see some bloody urethral drainage as well as blood in the initial part of your urinary stream for a few days  2.  If you are on a medicine for your prostate i.e. tamsulosin, alfuzosin, doxazosin or terazosin, it is okay to stop that 2-3 days after your procedure  3.  If you are on finasteride, it is okay to stop that immediately  4.  Limit your exertional activity for approximately 2 weeks after the procedure.  If you are having no bloody drainage or pain at that point, you may liberalize your activities  5.  Keep your follow-up appointment that is scheduled.  If you have problems before the appointment such as continued large clots in your urine, difficulty urinating or fever, contact us before at [phone]

## 2018-05-26 NOTE — Anesthesia Postprocedure Evaluation (Signed)
Anesthesia Post Note  Patient: Abrham Clemence.  Procedure(s) Performed: CYSTOSCOPY WITH INSERTION OF UROLIFT X 6 (N/A )     Patient location during evaluation: PACU Anesthesia Type: General Level of consciousness: awake and alert Pain management: pain level controlled Vital Signs Assessment: post-procedure vital signs reviewed and stable Respiratory status: spontaneous breathing, nonlabored ventilation, respiratory function stable and patient connected to nasal cannula oxygen Cardiovascular status: blood pressure returned to baseline and stable Postop Assessment: no apparent nausea or vomiting Anesthetic complications: no    Last Vitals:  Vitals:   05/26/18 0607 05/26/18 0820  BP: (!) 148/71 134/72  Pulse: 97 72  Resp: 18 17  Temp: 36.9 C 36.6 C  SpO2: 98% 100%    Last Pain:  Vitals:   05/26/18 0820  TempSrc:   PainSc: Asleep                 Oliana Gowens

## 2018-05-26 NOTE — Transfer of Care (Signed)
Immediate Anesthesia Transfer of Care Note  Patient: Thomas [name].  Procedure(s) Performed: CYSTOSCOPY WITH INSERTION OF UROLIFT X 6 (N/A )  Patient Location: PACU  Anesthesia Type:General  Level of Consciousness: sedated  Airway & Oxygen Therapy: Patient Spontanous Breathing and Patient connected to face mask oxygen  Post-op Assessment: Report given to RN and Post -op Vital signs reviewed and stable  Post vital signs: Reviewed and stable  Last Vitals:  Vitals Value Taken Time  BP 134/72 05/26/2018  8:20 AM  Temp    Pulse 72 05/26/2018  8:20 AM  Resp 17 05/26/2018  8:20 AM  SpO2 100 % 05/26/2018  8:20 AM  Vitals shown include unvalidated device data.  Last Pain:  Vitals:   05/26/18 0616  TempSrc:   PainSc: 0-No pain      Patients Stated Pain Goal: 4 (05/26/18 8502)  Complications: No apparent anesthesia complications

## 2018-05-26 NOTE — H&P (Signed)
H&P Chief Complaint: Urinary retention  History of Present Illness: 77 year old male with urinary retention presents for UroLift.  Past Medical History:  Diagnosis Date  . CHF (congestive heart failure) (HCC) 1999  . CKD (chronic kidney disease), stage II   . Coronary artery disease   . Hx of colonic polyps   . Hyperlipidemia   . Hyperplastic colon polyp 04/15/2007  . Hypertension   . Prostatic hypertrophy, benign    with elevated PSA  . Stroke (HCC)   . TIA (transient ischemic attack) 2003   "mini stroke" (03/19/2013)   Past Surgical History:  Procedure Laterality Date  . CARDIAC CATHETERIZATION    . CATARACT EXTRACTION W/ INTRAOCULAR LENS  IMPLANT, BILATERAL Bilateral 1998  . COLONOSCOPY  2008  . CORONARY ARTERY BYPASS GRAFT  1999   x4  . LOOP RECORDER INSERTION N/A 11/05/2017   Procedure: LOOP RECORDER INSERTION;  Surgeon: Hillis Range, MD;  Location: [facility];  Service: Cardiovascular;  Laterality: N/A;  . TEE WITHOUT CARDIOVERSION N/A 11/05/2017   Procedure: TRANSESOPHAGEAL ECHOCARDIOGRAM (TEE) WITH LOOP;  Surgeon: Lars Masson, MD;  Location: [facility] ENDOSCOPY;  Service: Cardiovascular;  Laterality: N/A;    Home Medications:  Medications Prior to Admission  Medication Sig Dispense Refill Last Dose  . acetaminophen (TYLENOL) 500 MG tablet Take 1,000 mg by mouth 2 (two) times daily as needed for moderate pain.   05/25/2018 at Unknown time  . atorvastatin (LIPITOR) 40 MG tablet Take 40 mg by mouth daily.   05/26/2018 at 0445  . cetirizine (ZYRTEC) 10 MG tablet Take 10 mg by mouth every evening.    05/25/2018 at Unknown time  . clopidogrel (PLAVIX) 75 MG tablet TAKE 1 TABLET BY MOUTH  DAILY WITH BREAKFAST 90 tablet 3 05/22/2018  . CRANBERRY CONCENTRATE PO Take 1 tablet by mouth 2 (two) times daily.   05/25/2018 at Unknown time  . doxylamine, Sleep, (UNISOM) 25 MG tablet Take 25 mg by mouth at bedtime.   05/25/2018 at Unknown time  . finasteride (PROSCAR) 5 MG  tablet Take 5 mg by mouth every evening.    05/25/2018 at Unknown time  . Flax OIL Take 1,300 mg by mouth daily.    05/25/2018 at Unknown time  . fluticasone (FLONASE) 50 MCG/ACT nasal spray Place 2 sprays into both nostrils daily as needed for allergies or rhinitis.  2 05/25/2018 at Unknown time  . folic acid (FOLVITE) 1 MG tablet TAKE 1 TABLET BY MOUTH  EVERY DAY 90 tablet 2 05/25/2018 at Unknown time  . metoprolol tartrate (LOPRESSOR) 50 MG tablet TAKE 1 TABLET BY MOUTH TWO  TIMES DAILY 180 tablet 1 05/26/2018 at 0445  . Omega-3 Fatty Acids (FISH OIL) 1200 MG CAPS Take 1,200 mg by mouth daily.   05/25/2018 at Unknown time  . Probiotic CAPS Take 1 capsule by mouth 2 (two) times daily.   05/25/2018 at Unknown time  . silver sulfADIAZINE (SILVADENE) 1 % cream Apply 1 application topically daily. 50 g 0 05/25/2018 at Unknown time  . tamsulosin (FLOMAX) 0.4 MG CAPS capsule Take 1 capsule (0.4 mg total) by mouth daily after supper. 30 capsule 4 05/25/2018 at Unknown time  . acetaminophen (TYLENOL) 325 MG tablet Take 1-2 tablets (325-650 mg total) by mouth every 4 (four) hours as needed for mild pain.   Taking  . atorvastatin (LIPITOR) 80 MG tablet Take 1 tablet (80 mg total) by mouth daily. (Patient not taking: Reported on 05/16/2018)   Not Taking at Unknown time  Allergies:  Allergies  Allergen Reactions  . Sulfonamide Derivatives Other (See Comments)    If taken orally causes weakness, lethargic    Family History  Problem Relation Age of Onset  . Coronary artery disease Mother   . Heart disease Mother   . Stroke Father   . Heart disease Father   . Heart disease Brother    Social History:  reports that he quit smoking about 5 years ago. His smoking use included cigarettes. He has a 37.50 pack-year smoking history. He has never used smokeless tobacco. He reports that he drinks about 1.0 standard drinks of alcohol per week. He reports that he does not use drugs.  ROS: A complete review of  systems was performed.  All systems are negative except for pertinent findings as noted. ROS   Physical Exam:  Vital signs in last 24 hours: Temp:  [98.4 F (36.9 C)] 98.4 F (36.9 C) (10/14 0607) Pulse Rate:  [97] 97 (10/14 0607) Resp:  [18] 18 (10/14 0607) BP: (148)/(71) 148/71 (10/14 0607) SpO2:  [98 %] 98 % (10/14 0607) Weight:  [72.7 kg] 72.7 kg (10/14 0616) General:  Alert and oriented, No acute distress HEENT: Normocephalic, atraumatic Neck: No JVD or lymphadenopathy Cardiovascular: Regular rate and rhythm Lungs: Regular rate and effort Abdomen: Soft, nontender, nondistended, no abdominal masses Back: No CVA tenderness Extremities: No edema Neurologic: Grossly intact  Laboratory Data:  No results found for this or any previous visit (from the past 24 hour(s)). No results found for this or any previous visit (from the past 240 hour(s)). Creatinine: No results for input(s): CREATININE in the last 168 hours.  Impression/Assessment:  Urinary retention  Plan:  Proceed with UroLift.  The patient and his wife understand there is a potential that the implants will not cure the urinary retention.  Ray Church, III 05/26/2018, 7:32 AM

## 2018-05-26 NOTE — Anesthesia Procedure Notes (Signed)
Procedure Name: LMA Insertion Date/Time: 05/26/2018 7:38 AM Performed by: Doran Clay, CRNA Pre-anesthesia Checklist: Patient identified, Emergency Drugs available, Suction available, Patient being monitored and Timeout performed Patient Re-evaluated:Patient Re-evaluated prior to induction Oxygen Delivery Method: Circle system utilized Preoxygenation: Pre-oxygenation with 100% oxygen Induction Type: IV induction LMA: LMA inserted LMA Size: 5.0 Tube type: Oral Number of attempts: 1 Placement Confirmation: positive ETCO2 and breath sounds checked- equal and bilateral Tube secured with: Tape Dental Injury: Teeth and Oropharynx as per pre-operative assessment

## 2018-05-27 ENCOUNTER — Encounter (HOSPITAL_COMMUNITY): Payer: Self-pay | Admitting: Urology

## 2018-06-02 LAB — CUP PACEART REMOTE DEVICE CHECK
Implantable Pulse Generator Implant Date: 20190326
MDC IDC SESS DTM: 20191007164058

## 2018-06-19 ENCOUNTER — Other Ambulatory Visit: Payer: Self-pay | Admitting: Cardiovascular Disease

## 2018-06-23 ENCOUNTER — Ambulatory Visit (INDEPENDENT_AMBULATORY_CARE_PROVIDER_SITE_OTHER): Payer: Medicare Other | Admitting: *Deleted

## 2018-06-23 DIAGNOSIS — I6340 Cerebral infarction due to embolism of unspecified cerebral artery: Secondary | ICD-10-CM

## 2018-06-23 DIAGNOSIS — I10 Essential (primary) hypertension: Secondary | ICD-10-CM

## 2018-06-23 NOTE — Progress Notes (Signed)
Carelink Summary Report / Loop Recorder 

## 2018-06-30 ENCOUNTER — Encounter: Payer: Self-pay | Admitting: Physical Medicine & Rehabilitation

## 2018-06-30 ENCOUNTER — Encounter: Payer: Medicare Other | Attending: Physical Medicine & Rehabilitation | Admitting: Physical Medicine & Rehabilitation

## 2018-06-30 VITALS — BP 127/78 | HR 57 | Resp 14

## 2018-06-30 DIAGNOSIS — Z87891 Personal history of nicotine dependence: Secondary | ICD-10-CM | POA: Diagnosis not present

## 2018-06-30 DIAGNOSIS — N183 Chronic kidney disease, stage 3 (moderate): Secondary | ICD-10-CM | POA: Diagnosis not present

## 2018-06-30 DIAGNOSIS — Z823 Family history of stroke: Secondary | ICD-10-CM | POA: Diagnosis not present

## 2018-06-30 DIAGNOSIS — I2510 Atherosclerotic heart disease of native coronary artery without angina pectoris: Secondary | ICD-10-CM | POA: Diagnosis not present

## 2018-06-30 DIAGNOSIS — I130 Hypertensive heart and chronic kidney disease with heart failure and stage 1 through stage 4 chronic kidney disease, or unspecified chronic kidney disease: Secondary | ICD-10-CM | POA: Diagnosis not present

## 2018-06-30 DIAGNOSIS — F0390 Unspecified dementia without behavioral disturbance: Secondary | ICD-10-CM | POA: Diagnosis not present

## 2018-06-30 DIAGNOSIS — N390 Urinary tract infection, site not specified: Secondary | ICD-10-CM | POA: Insufficient documentation

## 2018-06-30 DIAGNOSIS — I69354 Hemiplegia and hemiparesis following cerebral infarction affecting left non-dominant side: Secondary | ICD-10-CM | POA: Diagnosis not present

## 2018-06-30 DIAGNOSIS — F01518 Vascular dementia, unspecified severity, with other behavioral disturbance: Secondary | ICD-10-CM

## 2018-06-30 DIAGNOSIS — N400 Enlarged prostate without lower urinary tract symptoms: Secondary | ICD-10-CM | POA: Insufficient documentation

## 2018-06-30 DIAGNOSIS — Z8249 Family history of ischemic heart disease and other diseases of the circulatory system: Secondary | ICD-10-CM | POA: Insufficient documentation

## 2018-06-30 DIAGNOSIS — G8194 Hemiplegia, unspecified affecting left nondominant side: Secondary | ICD-10-CM | POA: Diagnosis not present

## 2018-06-30 DIAGNOSIS — F0151 Vascular dementia with behavioral disturbance: Secondary | ICD-10-CM | POA: Diagnosis not present

## 2018-06-30 DIAGNOSIS — I509 Heart failure, unspecified: Secondary | ICD-10-CM | POA: Insufficient documentation

## 2018-06-30 DIAGNOSIS — Z951 Presence of aortocoronary bypass graft: Secondary | ICD-10-CM | POA: Diagnosis not present

## 2018-06-30 NOTE — Patient Instructions (Signed)
PLEASE FEEL FREE TO CALL OUR OFFICE WITH ANY PROBLEMS OR QUESTIONS ([phone])      

## 2018-06-30 NOTE — Progress Notes (Signed)
Subjective:    Patient ID: Thomas Alba., male    DOB: [birth year], 77 y.o.   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  I last saw him in July.  He was in the hospital in September and October for urological procedures/UTI.  He had a cystoscopy in October with implantation of UroLift devices..  At our last visit we talked about an ASO which they purchased. However, things became unsafe due to balance/cognitive deficits. He remains very incontinent of urine. He sometimes incontinent of bowels too.   Cognitively he has shown some decline and is sundowning frequently at night. He is going once per week to center for adult enrichment.   Things have been increasingly difficult for his wife from a standpoint of is care.  She is exhausted from both mental and physical standpoint.  She is missed seeing other family such as her grandson because of the 24-hour care her husband requires.  He is eating fairly well but not as much as he used to.  He tends to need cueing to finish meals.   Pain Inventory Average Pain 6 Pain Right Now 0 My pain is ?  In the last 24 hours, has pain interfered with the following? General activity ? Relation with others ? Enjoyment of life ? What TIME of day is your pain at its worst? ? Sleep (in general) Fair  Pain is worse with: some activites Pain improves with: no pain meds Relief from Meds: ?  Mobility ability to climb steps?  no do you drive?  no use a wheelchair needs help with transfers  Function retired I need assistance with the following:  dressing, bathing, toileting, meal prep, household duties and shopping  Neuro/Psych trouble walking confusion  Prior Studies Any changes since last visit?  no  Physicians involved in your care Any changes since last visit?  no   Family History  Problem Relation Age of Onset  . Coronary artery disease Mother   . Heart disease Mother   . Stroke Father     . Heart disease Father   . Heart disease Brother    Social History   Socioeconomic History  . Marital status: Married    Spouse name: Thomas [name]  . Number of children: 2  . Years of education: college  . Highest education level: Not on file  Occupational History  . Occupation: retired  Engineer, production  . Financial resource strain: Not on file  . Food insecurity:    Worry: Not on file    Inability: Not on file  . Transportation needs:    Medical: Not on file    Non-medical: Not on file  Tobacco Use  . Smoking status: Former Smoker    Packs/day: 0.75    Years: 50.00    Pack years: 37.50    Types: Cigarettes    Last attempt to quit: 03/19/2013    Years since quitting: 5.2  . Smokeless tobacco: Never Used  Substance and Sexual Activity  . Alcohol use: Yes    Alcohol/week: 1.0 standard drinks    Types: 1 Cans of beer per week    Comment: does not drink anymore  . Drug use: No  . Sexual activity: Not Currently  Lifestyle  . Physical activity:    Days per week: Not on file    Minutes per session: Not on file  . Stress: Not on file  Relationships  . Social  connections:    Talks on phone: Not on file    Gets together: Not on file    Attends religious service: Not on file    Active member of club or organization: Not on file    Attends meetings of clubs or organizations: Not on file    Relationship status: Not on file  Other Topics Concern  . Not on file  Social History Narrative   Patient lives at home with wife Thomas [name])   Retired.   Education one year of college.   Right handed.   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    Past Surgical History:  Procedure Laterality Date  . CARDIAC CATHETERIZATION    . CATARACT EXTRACTION W/ INTRAOCULAR LENS  IMPLANT, BILATERAL Bilateral 1998  . COLONOSCOPY  2008  . CORONARY ARTERY BYPASS GRAFT  1999   x4  . CYSTOSCOPY WITH INSERTION OF UROLIFT N/A 05/26/2018   Procedure: CYSTOSCOPY WITH INSERTION OF UROLIFT X 6;  Surgeon: Crista Elliot, MD;  Location: [facility] ORS;  Service: Urology;  Laterality: N/A;  . LOOP RECORDER INSERTION N/A 11/05/2017   Procedure: LOOP RECORDER INSERTION;  Surgeon: Hillis Range, MD;  Location: [facility];  Service: Cardiovascular;  Laterality: N/A;  . TEE WITHOUT CARDIOVERSION N/A 11/05/2017   Procedure: TRANSESOPHAGEAL ECHOCARDIOGRAM (TEE) WITH LOOP;  Surgeon: Lars Masson, MD;  Location: [facility] ENDOSCOPY;  Service: Cardiovascular;  Laterality: N/A;   Past Medical History:  Diagnosis Date  . CHF (congestive heart failure) (HCC) 1999  . CKD (chronic kidney disease), stage II   . Coronary artery disease   . Hx of colonic polyps   . Hyperlipidemia   . Hyperplastic colon polyp 04/15/2007  . Hypertension   . Prostatic hypertrophy, benign    with elevated PSA  . Stroke (HCC)   . TIA (transient ischemic attack) 2003   "mini stroke" (03/19/2013)   BP 127/78   Pulse (!) 57   Resp 14   SpO2 97%   Opioid Risk Score:   Fall Risk Score:  `1  Depression screen PHQ 2/9  Depression screen [facility] 2/9 01/21/2018 12/04/2017 11/15/2016 11/15/2016 07/13/2014 06/25/2013  Decreased Interest 0 1 0 0 0 0  Down, Depressed, Hopeless 0 0 0 0 0 0  PHQ - 2 Score 0 1 0 0 0 0  Altered sleeping - 0 - - - -  Tired, decreased energy - 1 - - - -  Change in appetite - 1 - - - -  Feeling bad or failure about yourself  - 1 - - - -  Trouble concentrating - 2 - - - -  Moving slowly or fidgety/restless - 1 - - - -  Suicidal thoughts - 1 - - - -  PHQ-9 Score - 8 - - - -    Review of Systems  Constitutional: Negative.   HENT: Negative.   Eyes: Negative.   Respiratory: Negative.   Cardiovascular: Negative.   Gastrointestinal: Negative.   Genitourinary: Positive for difficulty urinating.  Musculoskeletal: Positive for gait problem.  Skin: Negative.   Allergic/Immunologic: Negative.   Hematological: Negative.   Psychiatric/Behavioral: Positive for confusion.  All other systems reviewed and are negative.        Objective:   Physical Exam General: No acute distress HEENT: EOMI, oral membranes moist Cards: reg rate  Chest: normal effort Abdomen: Soft, NT, ND Skin: dry, intact Extremities: no edema  Neurological:oriented to person. Follows commands  left inattention still mild LUE:5/5 proximal to distal,  RUE almost 5/5 RLE: 5-/5 proximal to distal LLE:5/5 proximal to distal.  left heel cord much more flexible, easily able to range past 90 degrees today Coordination improving. Sensation intact Skin: Warm Psychiatric:alert and pleasant      Assessment & Plan:  1. 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  -seeking placement/long term care. Offered help if needed for placement.  2. CAD s/p CABG: On lipitor and ASA 3.CKD stage III; stable 4. UTI/BPH:mgt per urology  5. Neuro-psych/dementia: -Advanced dementia.      -Agree with long-term placement    -Wife is done everything correct from a standpoint of environmental modifications.     -She does not feel that she needs anything from a medication standpoint to help with sundowning at nighttime.   Follow up with me PRN. of face to face patient care time were spent during this visit. All questions were encouraged and answered.Marland KitchenMarland Kitchen

## 2018-07-09 ENCOUNTER — Encounter: Payer: Self-pay | Admitting: Adult Health

## 2018-07-15 ENCOUNTER — Ambulatory Visit (INDEPENDENT_AMBULATORY_CARE_PROVIDER_SITE_OTHER): Payer: Medicare Other

## 2018-07-15 ENCOUNTER — Telehealth: Payer: Self-pay | Admitting: Adult Health

## 2018-07-15 DIAGNOSIS — Z111 Encounter for screening for respiratory tuberculosis: Secondary | ICD-10-CM | POA: Diagnosis not present

## 2018-07-15 NOTE — Telephone Encounter (Signed)
FL2 form to be filled out, placed in Dr's folder.  Call [phone] upon completion.

## 2018-07-15 NOTE — Progress Notes (Signed)
Per orders of [employer], injection of PPD Tuberculin 0.1 ml given by Carola Rhine. Patient tolerated injection well.

## 2018-07-16 ENCOUNTER — Other Ambulatory Visit: Payer: Self-pay | Admitting: Cardiovascular Disease

## 2018-07-17 NOTE — Telephone Encounter (Signed)
Placed on Cory's desk for completion.

## 2018-07-18 ENCOUNTER — Other Ambulatory Visit: Payer: Self-pay

## 2018-07-18 NOTE — Telephone Encounter (Signed)
[name] notified to pick up at the front desk.

## 2018-07-22 ENCOUNTER — Telehealth: Payer: Self-pay | Admitting: Family Medicine

## 2018-07-22 ENCOUNTER — Ambulatory Visit: Payer: Medicare Other

## 2018-07-22 DIAGNOSIS — Z111 Encounter for screening for respiratory tuberculosis: Secondary | ICD-10-CM

## 2018-07-22 NOTE — Telephone Encounter (Signed)
Copied from CRM [id number]. Topic: General - Call Back - No Documentation >> Jul 22, 2018 12:29 PM Lynne Logan D wrote: Reason for CRM: Lanette Hampshire, RN with [facility] called to speak to someone in reference to an FL2 from [employer]. Please advise. MW#[phone] / [phone]

## 2018-07-24 ENCOUNTER — Telehealth: Payer: Self-pay

## 2018-07-24 ENCOUNTER — Ambulatory Visit (INDEPENDENT_AMBULATORY_CARE_PROVIDER_SITE_OTHER): Payer: Medicare Other

## 2018-07-24 DIAGNOSIS — I6340 Cerebral infarction due to embolism of unspecified cerebral artery: Secondary | ICD-10-CM | POA: Diagnosis not present

## 2018-07-24 NOTE — Telephone Encounter (Signed)
Left a message on detailed machine informing Thomas [name] that I contacted [name] last week to pick up the FL2.  A copy has been sent to scan but is not in the chart as of yet.  Advised that she reach out to the pt's wife.  Asked that she call back if any questions.  Nothing further needed at this time.

## 2018-07-24 NOTE — Telephone Encounter (Signed)
See telephone encounter from 07/24/18

## 2018-07-24 NOTE — Telephone Encounter (Signed)
Copied from CRM [id number]. Topic: General - Other >> Jul 24, 2018 11:59 AM Marylen Ponto wrote: Reason for CRM: Cala Bradford with [facility] stated patient is scheduled to move in tomorrow (07/25/18) and the FL 2 form is needed. Cala Bradford requests a call back. Cala Bradford stated she can be reached in the office at [phone] or at cell# [phone].

## 2018-07-24 NOTE — Telephone Encounter (Signed)
Left a message for a return call.

## 2018-07-25 NOTE — Progress Notes (Signed)
Carelink Summary Report / Loop Recorder 

## 2018-07-26 LAB — QUANTIFERON-TB GOLD PLUS
NIL: 0.06 [IU]/mL
QUANTIFERON-TB GOLD PLUS: NEGATIVE
TB1-NIL: 0.02 IU/mL
TB2-NIL: 0.03 IU/mL

## 2018-08-04 ENCOUNTER — Telehealth: Payer: Self-pay

## 2018-08-04 NOTE — Telephone Encounter (Signed)
Called and spoke with patients wife [name], she is aware that we need a manual transmission. She is going to the memory care unit about 4PM today and will send transmission then. I went over step by step instructions with [name], if she has any questions she will call the office. [name] is also aware that if there are any episodes that need to be discussed a nurse will call her.

## 2018-08-04 NOTE — Telephone Encounter (Signed)
Manual transmission received. Will review with MD tomorrow.

## 2018-08-05 NOTE — Telephone Encounter (Signed)
Dr. Graciela Husbands reviewed "AF" episodes from 08/03/18--ECGs show SR w/frequent PACs.

## 2018-08-06 ENCOUNTER — Emergency Department (HOSPITAL_COMMUNITY): Payer: Medicare Other

## 2018-08-06 ENCOUNTER — Emergency Department (HOSPITAL_COMMUNITY)
Admission: EM | Admit: 2018-08-06 | Discharge: 2018-08-06 | Disposition: A | Discharge status: Other Acute Inpatient Hospital | Payer: Medicare Other | Attending: Emergency Medicine | Admitting: Emergency Medicine

## 2018-08-06 ENCOUNTER — Encounter (HOSPITAL_COMMUNITY): Payer: Self-pay

## 2018-08-06 DIAGNOSIS — N182 Chronic kidney disease, stage 2 (mild): Secondary | ICD-10-CM | POA: Insufficient documentation

## 2018-08-06 DIAGNOSIS — I2510 Atherosclerotic heart disease of native coronary artery without angina pectoris: Secondary | ICD-10-CM | POA: Insufficient documentation

## 2018-08-06 DIAGNOSIS — I5032 Chronic diastolic (congestive) heart failure: Secondary | ICD-10-CM | POA: Insufficient documentation

## 2018-08-06 DIAGNOSIS — I130 Hypertensive heart and chronic kidney disease with heart failure and stage 1 through stage 4 chronic kidney disease, or unspecified chronic kidney disease: Secondary | ICD-10-CM | POA: Diagnosis not present

## 2018-08-06 DIAGNOSIS — Z79899 Other long term (current) drug therapy: Secondary | ICD-10-CM | POA: Insufficient documentation

## 2018-08-06 DIAGNOSIS — Z87891 Personal history of nicotine dependence: Secondary | ICD-10-CM | POA: Diagnosis not present

## 2018-08-06 DIAGNOSIS — R0789 Other chest pain: Secondary | ICD-10-CM | POA: Insufficient documentation

## 2018-08-06 DIAGNOSIS — Z951 Presence of aortocoronary bypass graft: Secondary | ICD-10-CM | POA: Diagnosis not present

## 2018-08-06 DIAGNOSIS — R079 Chest pain, unspecified: Secondary | ICD-10-CM

## 2018-08-06 LAB — CBC
HCT: 38.4 % — ABNORMAL LOW (ref 39.0–52.0)
Hemoglobin: 12.9 g/dL — ABNORMAL LOW (ref 13.0–17.0)
MCH: 30.9 pg (ref 26.0–34.0)
MCHC: 33.6 g/dL (ref 30.0–36.0)
MCV: 92.1 fL (ref 80.0–100.0)
Platelets: 273 10*3/uL (ref 150–400)
RBC: 4.17 MIL/uL — ABNORMAL LOW (ref 4.22–5.81)
RDW: 13.5 % (ref 11.5–15.5)
WBC: 10 10*3/uL (ref 4.0–10.5)
nRBC: 0 % (ref 0.0–0.2)

## 2018-08-06 LAB — BASIC METABOLIC PANEL
Anion gap: 10 (ref 5–15)
BUN: 12 mg/dL (ref 8–23)
CO2: 23 mmol/L (ref 22–32)
Calcium: 8.7 mg/dL — ABNORMAL LOW (ref 8.9–10.3)
Chloride: 101 mmol/L (ref 98–111)
Creatinine, Ser: 0.96 mg/dL (ref 0.61–1.24)
GFR calc Af Amer: >60 mL/min (ref >60)
GFR calc non Af Amer: >60 mL/min (ref >60)
Glucose, Bld: 124 mg/dL — ABNORMAL HIGH (ref 70–99)
Potassium: 3.5 mmol/L (ref 3.5–5.1)
SODIUM: 134 mmol/L — AB (ref 135–145)

## 2018-08-06 LAB — I-STAT TROPONIN, ED
TROPONIN I, POC: 0.02 ng/mL (ref 0.00–0.08)
Troponin i, poc: 0.03 ng/mL (ref 0.00–0.08)

## 2018-08-06 NOTE — ED Triage Notes (Signed)
Pt presents for evaluation of chest pain x 10 days intermittently, worsened today. Hx of CABG. Pain only lasts about "2 seconds" when it comes. No nitro. 324 ASA in route.

## 2018-08-06 NOTE — ED Provider Notes (Signed)
[name] EMERGENCY DEPARTMENT Provider Note   CSN: [id number] Arrival date & time: 08/06/18  1053     History   Chief Complaint Chief Complaint  Patient presents with  . Chest Pain    HPI Athel Elbers. is a 77 y.o. male.  The history is provided by the patient.  Chest Pain   This is a new problem. The current episode started more than 2 days ago. The problem occurs daily. The problem has been resolved. The pain is associated with rest. The pain is present in the substernal region. The pain is at a severity of 3/10. The pain is mild. The quality of the pain is described as sharp. The pain does not radiate. Pertinent negatives include no abdominal pain, no back pain, no cough, no exertional chest pressure, no fever, no hemoptysis, no irregular heartbeat, no leg pain, no orthopnea, no palpitations, no PND, no shortness of breath, no sputum production and no vomiting. He has tried rest for the symptoms.  His past medical history is significant for CAD, hyperlipidemia and hypertension.  Pertinent negatives for past medical history include no seizures.    Past Medical History:  Diagnosis Date  . CHF (congestive heart failure) (HCC) 1999  . CKD (chronic kidney disease), stage II   . Coronary artery disease   . Hx of colonic polyps   . Hyperlipidemia   . Hyperplastic colon polyp 04/15/2007  . Hypertension   . Prostatic hypertrophy, benign    with elevated PSA  . Stroke (HCC)   . TIA (transient ischemic attack) 2003   "mini stroke" (03/19/2013)    Patient Active Problem List   Diagnosis Date Noted  . Delirium 01/26/2018  . 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 Foul smelling urine   . Leukocytosis   . Hyponatremia   . 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 Benign essential HTN   . Coronary artery disease involving native coronary artery of native heart without angina pectoris   . Stage 3 chronic kidney disease (HCC)     . Acute lower UTI   . Seasonal allergies   . Stroke (HCC) 11/04/2017  . HLD (hyperlipidemia) 11/04/2017  . Chronic diastolic CHF (congestive heart failure) (HCC) 11/04/2017  . Acute renal failure superimposed on stage 2 chronic kidney disease (HCC) 11/04/2017  . Acute metabolic encephalopathy 11/04/2017  . UTI (urinary tract infection) 11/04/2017  . AKI (acute kidney injury) (HCC)   . History of stroke   . Prediabetes   . Left hemiparesis (HCC) 03/19/2013  . Cerebral infarction (HCC) 03/19/2013  . Left leg weakness 03/19/2013  . CAD, ARTERY BYPASS GRAFT 09/30/2008  . 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 Borderline hyperglycemia 12/03/2007  . Other and unspecified hyperlipidemia 07/28/2007  . Essential hypertension 07/28/2007  . 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 Coronary atherosclerosis 07/28/2007  . BENIGN PROSTATIC HYPERTROPHY 07/28/2007  . COLONIC POLYPS, 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    Past Surgical History:  Procedure Laterality Date  . CARDIAC CATHETERIZATION    . CATARACT EXTRACTION W/ INTRAOCULAR LENS  IMPLANT, BILATERAL Bilateral 1998  . COLONOSCOPY  2008  . CORONARY ARTERY BYPASS GRAFT  1999   x4  . CYSTOSCOPY WITH INSERTION OF UROLIFT N/A 05/26/2018   Procedure: CYSTOSCOPY WITH INSERTION OF UROLIFT X 6;  Surgeon: Crista Elliot, MD;  Location: [facility];  Service: Urology;  Laterality: N/A;  . LOOP RECORDER INSERTION N/A 11/05/2017   Procedure: LOOP RECORDER INSERTION;  Surgeon: Hillis RangeAllred, James, MD;  [facility] INVAS[facility] CV LAB;  Service: Cardiovascular;  Laterality: N/A;  . TEE WITHOUT CARDIOVERSION N/A 11/05/2017   Procedure: TRANSESOPHAGEAL ECHOCARDIOGRAM (TEE) WITH LOOP;  Surgeon: Lars MassonNelson, Katarina H, MD;  Location: [facility];  Service: Cardiovascular;  Laterality: N/A;        Home Medications    Prior to Admission medications   Medication Sig Start Date End Date Taking? Authorizing Provider  atorvastatin (LIPITOR) 40 MG tablet TAKE 1 TABLET BY MOUTH   DAILY Patient taking differently: Take 40 mg by mouth daily at 6 PM.  07/16/18  Yes Wendall StadeNishan, Peter C, MD  cetirizine (ZYRTEC) 10 MG tablet Take 10 mg by mouth daily.    Yes [provider]  clopidogrel (PLAVIX) 75 MG tablet TAKE 1 TABLET BY MOUTH  DAILY WITH BREAKFAST Patient taking differently: Take 75 mg by mouth daily.  06/19/18  Yes Wendall StadeNishan, Peter C, MD  Flax OIL Take 1,300 mg by mouth daily.    Yes [provider]  fluticasone (FLONASE) 50 MCG/ACT nasal spray Place 2 sprays into both nostrils daily as needed for allergies or rhinitis. 11/19/17  Yes Love, Evlyn KannerPamela S, PA-C  folic acid (FOLVITE) 1 MG tablet TAKE 1 TABLET BY MOUTH  EVERY DAY Patient taking differently: Take 1 mg by mouth daily.  02/03/18  Yes Wendall StadeNishan, Peter C, MD  metoprolol tartrate (LOPRESSOR) 50 MG tablet TAKE 1 TABLET BY MOUTH TWO  TIMES DAILY Patient taking differently: Take 50 mg by mouth 2 (two) times daily.  03/03/18  Yes Wendall StadeNishan, Peter C, MD  Omega-3 Fatty Acids (FISH OIL) 1200 MG CAPS Take 1,200 mg by mouth daily.   Yes [provider]  acetaminophen (TYLENOL) 325 MG tablet Take 1-2 tablets (325-650 mg total) by mouth every 4 (four) hours as needed for mild pain. Patient not taking: Reported on 08/06/2018 11/19/17   Love, Evlyn KannerPamela S, PA-C  atorvastatin (LIPITOR) 80 MG tablet Take 1 tablet (80 mg total) by mouth daily. Patient not taking: Reported on 08/06/2018 11/06/17   Joseph ArtVann, Jessica U, DO  silver sulfADIAZINE (SILVADENE) 1 % cream Apply 1 application topically daily. Patient not taking: Reported on 08/06/2018 05/16/18   Nelwyn SalisburyFry, Stephen A, MD  tamsulosin (FLOMAX) 0.4 MG CAPS capsule Take 1 capsule (0.4 mg total) by mouth daily after supper. Patient not taking: Reported on 08/06/2018 01/21/18   Ranelle OysterSwartz, Zachary T, MD    Family History Family History  Problem Relation Age of Onset  . Coronary artery disease Mother   . Heart disease Mother   . Stroke Father   . Heart disease Father   . Heart disease Brother      Social History Social History   Tobacco Use  . Smoking status: Former Smoker    Packs/day: 0.75    Years: 50.00    Pack years: 37.50    Types: Cigarettes    Last attempt to quit: 03/19/2013    Years since quitting: 5.3  . Smokeless tobacco: Never Used  Substance Use Topics  . Alcohol use: Yes    Alcohol/week: 1.0 standard drinks    Types: 1 Cans of beer per week    Comment: does not drink anymore  . Drug use: No     Allergies   Sulfonamide derivatives   Review of Systems Review of Systems  Constitutional: Negative for chills and fever.  HENT: Negative for ear pain and sore throat.   Eyes: Negative for pain and visual disturbance.  Respiratory: Negative for cough,  hemoptysis, sputum production and shortness of breath.   Cardiovascular: Positive for chest pain. Negative for palpitations, orthopnea and PND.  Gastrointestinal: Negative for abdominal pain and vomiting.  Genitourinary: Negative for dysuria and hematuria.  Musculoskeletal: Negative for arthralgias and back pain.  Skin: Negative for color change and rash.  Neurological: Negative for seizures and syncope.  All other systems reviewed and are negative.    Physical Exam Updated Vital Signs  ED Triage Vitals  Enc Vitals Group     BP 08/06/18 1058 134/74     Pulse Rate 08/06/18 1058 63     Resp 08/06/18 1058 18     Temp 08/06/18 1058 97.7 F (36.5 C)     Temp Source 08/06/18 1058 Oral     SpO2 08/06/18 1058 100 %     Weight --      Height --      Head Circumference --      Peak Flow --      Pain Score 08/06/18 1054 0     Pain Loc --      Pain Edu? --      Excl. in GC? --     Physical Exam Vitals signs and nursing note reviewed.  Constitutional:      Appearance: He is well-developed.  HENT:     Head: Normocephalic and atraumatic.  Eyes:     Extraocular Movements: Extraocular movements intact.     Conjunctiva/sclera: Conjunctivae normal.     Pupils: Pupils are equal, round, and reactive to  light.  Neck:     Musculoskeletal: Normal range of motion and neck supple.  Cardiovascular:     Rate and Rhythm: Normal rate and regular rhythm.     Pulses:          Radial pulses are 2+ on the right side and 2+ on the left side.     Heart sounds: Normal heart sounds. No murmur.  Pulmonary:     Effort: Pulmonary effort is normal. No respiratory distress.     Breath sounds: Rales present. No decreased breath sounds, wheezing or rhonchi.  Abdominal:     Palpations: Abdomen is soft.     Tenderness: There is no abdominal tenderness.  Musculoskeletal: Normal range of motion.     Right lower leg: No edema.     Left lower leg: No edema.  Skin:    General: Skin is warm and dry.  Neurological:     General: No focal deficit present.     Mental Status: He is alert.  Psychiatric:        Mood and Affect: Mood normal.      ED Treatments / Results  Labs (all labs ordered are listed, but only abnormal results are displayed) Labs Reviewed  BASIC METABOLIC PANEL - Abnormal; Notable for the following components:      Result Value   Sodium 134 (*)    Glucose, Bld 124 (*)    Calcium 8.7 (*)    All other components within normal limits  CBC - Abnormal; Notable for the following components:   RBC 4.17 (*)    Hemoglobin 12.9 (*)    HCT 38.4 (*)    All other components within normal limits  I-STAT TROPONIN, ED  I-STAT TROPONIN, ED    EKG EKG Interpretation  Date/Time:  Wednesday August 06 2018 10:57:23 EST Ventricular Rate:  58 PR Interval:    QRS Duration: 90 QT Interval:  418 QTC Calculation: 411 R Axis:   16  Text Interpretation:  Sinus rhythm Abnormal R-wave progression, early transition Nonspecific repol abnormality, lateral leads unchanged from prior Confirmed by Virgina Norfolk [phone]) on 08/06/2018 11:01:49 AM   Radiology Dg Chest Portable 1 View  Result Date: 08/06/2018 CLINICAL DATA:  Chest pain for 10 days EXAM: PORTABLE CHEST 1 VIEW COMPARISON:  01/26/2018 FINDINGS:  Prior CABG. Loop recorder. The patient is rotated to the left on today's radiograph, reducing diagnostic sensitivity and specificity. Left anterior rib deformities from old fractures. Tapering of the peripheral pulmonary vasculature favors emphysema. Faint density at the right lung base is probably a nipple shadow. Atherosclerotic calcification of the aortic arch. IMPRESSION: 1. No acute findings. 2. Aortic Atherosclerosis (ICD10-I70.0) and Emphysema (ICD10-J43.9). Electronically Signed   By: Gaylyn Rong M.D.   On: 08/06/2018 11:32    Procedures Procedures (including critical care time)  Medications Ordered in ED Medications - No data to display   Initial Impression / Assessment and Plan / ED Course  I have reviewed the triage vital signs and the nursing notes.  Pertinent labs & imaging results that were available during my care of the patient were reviewed by me and considered in my medical decision making (see chart for details).     Drayk Humbarger. is a 77 year old male with history of coronary artery disease status post CABG, heart failure, stroke who presents to the ED with chest pain.  Patient with normal vitals.  No fever.  Patient with intermittent chest pain for the last 10 days.  Occurred again this morning.  Did not radiate, no diaphoresis.  Patient has no cough, no sputum production.  EKG shows sinus rhythm.  T wave inversions are unchanged from prior EKG.  Overall unremarkable EKG when compared to others.  Troponin negative x2.  Patient with clear breath sounds.  No signs of pneumonia, pneumothorax, pleural effusion on chest x-ray.  No significant anemia, electrolyte abnormality, kidney injury.  No leukocytosis.  Doubt infectious process.  Loop recorder was interrogated that showed possibly some PACs.  Otherwise unremarkable.  Discussed the case with cardiology on-call and they recommend outpatient follow-up with Dr. Eden Emms after reviewing EKG and labs and patient history.   No need for further cardiac work-up at this time given atypical story and reassuring EKG and troponins. Patient remained chest pain free upon my re-eval.  Given return precautions and discharged fromED in good condition.  This chart was dictated using voice recognition software.  Despite best efforts to proofread,  errors can occur which can change the documentation meaning.   Final Clinical Impressions(s) / ED Diagnoses   Final diagnoses:  Chest pain, unspecified type    ED Discharge Orders    None       Virgina Norfolk, DO 08/06/18 1440

## 2018-08-07 ENCOUNTER — Telehealth: Payer: Self-pay | Admitting: Nurse Practitioner

## 2018-08-07 ENCOUNTER — Other Ambulatory Visit: Payer: Self-pay | Admitting: Internal Medicine

## 2018-08-07 NOTE — Telephone Encounter (Signed)
New Message      TOC APPT: L. Gerhardt 09/09/18 @3 :00pm

## 2018-08-07 NOTE — Telephone Encounter (Signed)
Patient contacted regarding discharge from [facility] on August 06, 2018.  Patient understands to follow up with provider Norma Fredrickson, NP on September 09, 2018 at 3pm at [facility] Patient understands discharge instructions? yes Patient understands medications and regiment? yes Patient understands to bring all medications to this visit? yes  Spoke with pt's wife, DPR on file.  Pt has not had any recurrent CP since yesterday.  Advised wife if this changes, please contact the office.

## 2018-08-09 LAB — CUP PACEART REMOTE DEVICE CHECK
Date Time Interrogation Session: 20191109211142
Implantable Pulse Generator Implant Date: 20190326

## 2018-08-26 ENCOUNTER — Ambulatory Visit (INDEPENDENT_AMBULATORY_CARE_PROVIDER_SITE_OTHER): Payer: Medicare Other

## 2018-08-26 DIAGNOSIS — I6340 Cerebral infarction due to embolism of unspecified cerebral artery: Secondary | ICD-10-CM | POA: Diagnosis not present

## 2018-08-27 NOTE — Progress Notes (Signed)
Carelink Summary Report / Loop Recorder 

## 2018-08-29 ENCOUNTER — Encounter: Payer: Self-pay | Admitting: Adult Health

## 2018-08-29 ENCOUNTER — Ambulatory Visit: Payer: Medicare Other | Admitting: Adult Health

## 2018-08-29 VITALS — BP 98/60 | HR 63 | Ht 71.0 in

## 2018-08-29 DIAGNOSIS — E785 Hyperlipidemia, unspecified: Secondary | ICD-10-CM

## 2018-08-29 DIAGNOSIS — I639 Cerebral infarction, unspecified: Secondary | ICD-10-CM | POA: Diagnosis not present

## 2018-08-29 DIAGNOSIS — F0151 Vascular dementia with behavioral disturbance: Secondary | ICD-10-CM | POA: Diagnosis not present

## 2018-08-29 DIAGNOSIS — F01518 Vascular dementia, unspecified severity, with other behavioral disturbance: Secondary | ICD-10-CM

## 2018-08-29 DIAGNOSIS — I10 Essential (primary) hypertension: Secondary | ICD-10-CM

## 2018-08-29 LAB — CUP PACEART REMOTE DEVICE CHECK
Date Time Interrogation Session: 20200114224012
Implantable Pulse Generator Implant Date: 20190326

## 2018-08-29 NOTE — Progress Notes (Signed)
I agree with the above plan 

## 2018-08-29 NOTE — Patient Instructions (Addendum)
Complaints during visit of LLQ abdominal pain - increased pain with leg flexion and palpitation. Recommend possibly obtaining KUB or CT abdomen to assess for underlying cause of continued pain.   Continue clopidogrel 75 mg daily  and atorvastatin  for secondary stroke prevention  Continue to follow up with PCP regarding cholesterol and blood pressure management   Continue to monitor blood pressure at home  Maintain strict control of hypertension with blood pressure goal below 130/90, diabetes with hemoglobin A1c goal below 6.5% and cholesterol with LDL cholesterol (bad cholesterol) goal below 70 mg/dL. I also advised the patient to eat a healthy diet with plenty of whole grains, cereals, fruits and vegetables, exercise regularly and maintain ideal body weight.  Followup in the future with me as needed or call earlier if needed       Thank you for coming to see [place] at [facility]. I hope we have been able to provide you high quality care today.  You may receive a patient satisfaction survey over the next few weeks. We would appreciate your feedback and comments so that we may continue to improve ourselves and the health of our patients.

## 2018-08-29 NOTE — Progress Notes (Signed)
STROKE NEUROLOGY FOLLOW UP NOTE  NAME: Thomas [name]. DOB: Aug 02, 1941  REASON FOR VISIT: stroke follow up HISTORY FROM: wife and chart  Today we had the pleasure of seeing Thomas [name]. in follow-up at our Neurology Clinic. Pt was accompanied by wife.   History Summary Thomas [name]. is a 78 y.o. male with history of CHF, CKD, CAD s/p CABG 1999, HLD, HTN, TIA in 2003 and stroke in 2014 admitted on 11/03/17 for confusion and left visual field cut. No tPA given due to clear hypodensity on CT. MRI showed right PCA large, right cerebellum and right MCA punctate infarcts,   As well as remote lacunar infarct in cerebellum, BG and brainstem.  CT head and neck right P3 flow decreased, left ICA proximal string sign at bifurcation, bilateral ICA siphon progressive atherosclerosis with stenosis, and right V1 and V4 stenosis.  EF 55 to 60%.  LE venous Doppler no DVT.  TEE unremarkable, loop recorder placed.  LDL 80 and A1c 5.8.  Discharged on DAPT for 3 months and then Plavix alone as well as high dose Lipitor.  Also referred to [facility] for outpatient follow-up of asymptomatic left ICA high-grade stenosis.  Hx of stroke/TIA  TIA 2003  Stroke 03/2013 with left lower extremity weakness, MRI showed right CR, left frontal cortex, left occipital white matter infarcts, LDL 55, A1c 5.9, carotid Doppler negative, MRI showed right VA and bilateral ICA siphon stenosis.  Aspirin 325 changed to aspirin 325 and Plavix 75 dual antiplatelet.  Followed with Dr. Pearlean Brownie in clinic and on plavix   12/23/17 visit JX: During the interval time, the patient has been doing well from a stroke standpoint.  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  Currently has outpatient PT/OT twice a week.  Still walk with walker.  Wife stated that patient had sleep-wake cycle disturbance, not sleep at night, taking naps during the day.  Sometimes has behavior changes, cursing to wife, agitated.  Still on aspirin and Plavix as well as  Lipitor without side effect.  BP today 117/66.  Loop recorder no A. fib so far.  02/26/18 UPDATE: Patient returns today for 59-month follow-up and is accompanied by his wife. Patient does have residual left ankle weakness in which he is currently wearing a brace for.  He is currently sitting in a wheelchair that he uses for long distance but does use a rolling walker for short distance ambulation.  He has completed all rehabs but continues to do home exercises.  He continues to take Plavix without side effects of bleeding or bruising.  Continues to take Lipitor without side effects myalgias.  Blood pressure today satisfactory 115/60.  States his memory has been the same but his nighttime behavioral disturbances have greatly improved.  Loop recorder has not shown atrial fibrillation thus far.  Denies new or worsening stroke/TIA symptoms.  Interval history 08/29/2018: Patient is being seen today for 49-month follow-up visit.  He was in the hospital in September and October for urological procedure/UTI and a cystoscopy in October with implantation of UroLift devices.  Patient has had worsening cognitive deficits and due to difficulty continuing 24 hr care, patient now resides in memory care unit at [facility].  Per wife, patient has been complaining of pain in multiple locations and is being seen by provider at facility.  At today's visit, he is complaining of LLQ pain with increased pain upon palpation and left leg flexion.  Wife does not believe any work-up has  been done regarding his abdomen.  He continues on Plavix with mild bruising but no bleeding.  Continues on atorvastatin without side effects myalgias.  Blood pressure today satisfactory at 98/60.  Loop recorder has not shown atrial fibrillation thus far.  No further concerns at this time.  Denies new or worsening stroke/TIA symptoms.   REVIEW OF SYSTEMS: Full 14 system review of systems performed and notable only for those listed below and in HPI above,  all others are negative:  Appetite change, incontinence of bowel, incontinence of bladder, back pain, neck pain, bruise easily, memory loss, agitation, behavior problem and wounds on hand    The following represents the patient's updated allergies and side effects list: Allergies  Allergen Reactions  . Sulfonamide Derivatives Other (See Comments)    If taken orally causes weakness, lethargic    The neurologically relevant items on the patient's problem list were reviewed on today's visit.  Neurologic Examination  A problem focused neurological exam was performed.  Blood pressure 98/60, pulse 63, height 5\' 11"  (1.803 m).  General -frail elderly Caucasian male, fidgety in wheelchair and moaning in pain when moving.  Ophthalmologic - Fundi not visualized due to eye movement.  Cardiovascular - Regular rate and rhythm.  Mental Status -  Level of arousal and disorientation to time, place, and person Language including expression, naming, repetition intact  Cranial Nerves II - XII - II - unable to test visual fields due to uncooperation.  III, IV, VI - Extraocular movements intact. V - Facial sensation intact bilaterally. VII - Facial movement intact bilaterally. VIII - Hearing & vestibular intact bilaterally. X - Palate elevates symmetrically. XI - Chin turning & shoulder shrug intact bilaterally. XII - Tongue protrusion intact.  Motor Strength - The patient's strength was normal in all extremities and pronator drift was absent.  Bulk was normal and fasciculations were absent.   Motor Tone - Muscle tone was assessed at the neck and appendages and was normal.  Reflexes - The patient's reflexes were 1+ in all extremities and he had no pathological reflexes.  Sensory - Light touch, temperature/pinprick were assessed and were normal.    Coordination - The patient had normal movements in the hands with no ataxia or dysmetria.  Tremor was absent.  Gait and Station - patient is  currently nonambulatory    Data reviewed: I personally reviewed the images and agree with the radiology interpretations.  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 Vascular risk factors include CHF, CAD, HLD, and HTN.  Patient is being seen today for six-month follow-up visit with worsening cognitive decline and vascular dementia.  He currently resides in a memory care unit.   Plan:  - continue plavix and Lipitor for secondary stroke prevention -Recommended further work-up of LLQ pain at facility -Recommended initiation of medications to assist with memory loss and dementia behaviors.  Unsure if updated medication list was provided and unable to see current plan at facility therefore these medications were not started at today's visit -Continue management of HDL and HTN for secondary stroke prevention -Continue to monitor loop recorder - Follow up with your primary care physician for stroke risk factor modification. Recommend maintain blood pressure goal <130/80, diabetes with hemoglobin A1c goal below 7.0% and lipids with LDL cholesterol goal below 70 mg/dL.   Patient has been stable from a stroke standpoint and  due to worsening dementia with difficulty transporting patient to appointments, it was recommended to follow-up as needed but did advise wife to call office with any questions or concerns.  I spent more than 25 minutes of face to face time with the patient. Greater than 50% of time was spent in counseling and coordination of care. We discussed worsening of vascular dementia with behaviors and possible need for medication management which will be managed by facility   George Hugh, AGNP-BC  [facility] [address] [place], [place] [address]  Phone [phone] Fax [phone] Note: This document was  prepared with digital dictation and possible smart phrase technology. Any transcriptional errors that result from this process are unintentional.

## 2018-08-31 ENCOUNTER — Encounter (HOSPITAL_COMMUNITY): Payer: Self-pay

## 2018-08-31 ENCOUNTER — Emergency Department (HOSPITAL_COMMUNITY)
Admission: EM | Admit: 2018-08-31 | Discharge: 2018-08-31 | Disposition: A | Discharge status: Home / Self Care | Payer: Medicare Other | Attending: Emergency Medicine | Admitting: Emergency Medicine

## 2018-08-31 ENCOUNTER — Emergency Department (HOSPITAL_COMMUNITY): Payer: Medicare Other

## 2018-08-31 DIAGNOSIS — F0390 Unspecified dementia without behavioral disturbance: Secondary | ICD-10-CM | POA: Insufficient documentation

## 2018-08-31 DIAGNOSIS — I130 Hypertensive heart and chronic kidney disease with heart failure and stage 1 through stage 4 chronic kidney disease, or unspecified chronic kidney disease: Secondary | ICD-10-CM | POA: Diagnosis not present

## 2018-08-31 DIAGNOSIS — Y999 Unspecified external cause status: Secondary | ICD-10-CM | POA: Diagnosis not present

## 2018-08-31 DIAGNOSIS — I5032 Chronic diastolic (congestive) heart failure: Secondary | ICD-10-CM | POA: Insufficient documentation

## 2018-08-31 DIAGNOSIS — Z7902 Long term (current) use of antithrombotics/antiplatelets: Secondary | ICD-10-CM | POA: Diagnosis not present

## 2018-08-31 DIAGNOSIS — N183 Chronic kidney disease, stage 3 (moderate): Secondary | ICD-10-CM | POA: Insufficient documentation

## 2018-08-31 DIAGNOSIS — Z951 Presence of aortocoronary bypass graft: Secondary | ICD-10-CM | POA: Diagnosis not present

## 2018-08-31 DIAGNOSIS — Y929 Unspecified place or not applicable: Secondary | ICD-10-CM | POA: Insufficient documentation

## 2018-08-31 DIAGNOSIS — S5000XA Contusion of unspecified elbow, initial encounter: Secondary | ICD-10-CM

## 2018-08-31 DIAGNOSIS — K5900 Constipation, unspecified: Secondary | ICD-10-CM | POA: Diagnosis not present

## 2018-08-31 DIAGNOSIS — Z87891 Personal history of nicotine dependence: Secondary | ICD-10-CM | POA: Diagnosis not present

## 2018-08-31 DIAGNOSIS — I2510 Atherosclerotic heart disease of native coronary artery without angina pectoris: Secondary | ICD-10-CM | POA: Insufficient documentation

## 2018-08-31 DIAGNOSIS — S59902A Unspecified injury of left elbow, initial encounter: Secondary | ICD-10-CM | POA: Diagnosis present

## 2018-08-31 DIAGNOSIS — Y939 Activity, unspecified: Secondary | ICD-10-CM | POA: Insufficient documentation

## 2018-08-31 DIAGNOSIS — W050XXA Fall from non-moving wheelchair, initial encounter: Secondary | ICD-10-CM | POA: Diagnosis not present

## 2018-08-31 DIAGNOSIS — S5002XA Contusion of left elbow, initial encounter: Secondary | ICD-10-CM | POA: Insufficient documentation

## 2018-08-31 NOTE — ED Provider Notes (Signed)
[name] EMERGENCY DEPARTMENT Provider Note   CSN: [id number] Arrival date & time: 08/31/18  1223     History   Chief Complaint Chief Complaint  Patient presents with  . Fall    HPI Thomas [name]. is a 78 y.o. male.  78 year old male with history of dementia here after falling from his wheelchair.  Patient fell to his left elbow.  No loss of consciousness.  Does complain of some bilateral paraspinal neck discomfort.  His wife is here states that he is at his baseline.  No obvious history of head trauma but the patient does take blood thinners, Plavix.  Patient has been is otherwise state of health with the exception of some weight loss which the wife attributes to lack of eating.  Was seen by his doctor recently because of a questionable abdominal discomfort and has been ordered to have acute abdominal series performed.  No recent fever, vomiting, bloody stools.  [place] Jansky via EMS     Past Medical History:  Diagnosis Date  . CHF (congestive heart failure) (HCC) 1999  . CKD (chronic kidney disease), stage II   . Coronary artery disease   . Hx of colonic polyps   . Hyperlipidemia   . Hyperplastic colon polyp 04/15/2007  . Hypertension   . Prostatic hypertrophy, benign    with elevated PSA  . Stroke (HCC)   . TIA (transient ischemic attack) 2003   "mini stroke" (03/19/2013)    Patient Active Problem List   Diagnosis Date Noted  . Delirium 01/26/2018  . 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 Foul smelling urine   . Leukocytosis   . Hyponatremia   . 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 Benign essential HTN   . Coronary artery disease involving native coronary artery of native heart without angina pectoris   . Stage 3 chronic kidney disease (HCC)   . Acute lower UTI   . Seasonal allergies   . Stroke (HCC) 11/04/2017  . HLD (hyperlipidemia) 11/04/2017  . Chronic diastolic CHF (congestive heart failure) (HCC) 11/04/2017   . Acute renal failure superimposed on stage 2 chronic kidney disease (HCC) 11/04/2017  . Acute metabolic encephalopathy 11/04/2017  . UTI (urinary tract infection) 11/04/2017  . AKI (acute kidney injury) (HCC)   . History of stroke   . Prediabetes   . Left hemiparesis (HCC) 03/19/2013  . Cerebral infarction (HCC) 03/19/2013  . Left leg weakness 03/19/2013  . CAD, ARTERY BYPASS GRAFT 09/30/2008  . 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 Borderline hyperglycemia 12/03/2007  . Other and unspecified hyperlipidemia 07/28/2007  . Essential hypertension 07/28/2007  . 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 Coronary atherosclerosis 07/28/2007  . BENIGN PROSTATIC HYPERTROPHY 07/28/2007  . COLONIC POLYPS, 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    Past Surgical History:  Procedure Laterality Date  . CARDIAC CATHETERIZATION    . CATARACT EXTRACTION W/ INTRAOCULAR LENS  IMPLANT, BILATERAL Bilateral 1998  . COLONOSCOPY  2008  . CORONARY ARTERY BYPASS GRAFT  1999   x4  . CYSTOSCOPY WITH INSERTION OF UROLIFT N/A 05/26/2018   Procedure: CYSTOSCOPY WITH INSERTION OF UROLIFT X 6;  Surgeon: Crista Elliot, MD;  Location: [facility];  Service: Urology;  Laterality: N/A;  . LOOP RECORDER INSERTION N/A 11/05/2017   Procedure: LOOP RECORDER INSERTION;  Surgeon: Hillis Range, MD;  Location: [facility] INVASIVE CV LAB;  Service: Cardiovascular;  Laterality: N/A;  . TEE WITHOUT CARDIOVERSION N/A 11/05/2017   Procedure: TRANSESOPHAGEAL ECHOCARDIOGRAM (  TEE) WITH LOOP;  Surgeon: Lars MassonNelson, Katarina H, MD;  [facility];  Service: Cardiovascular;  Laterality: N/A;        Home Medications    Prior to Admission medications   Medication Sig Start Date End Date Taking? Authorizing Provider  acetaminophen (TYLENOL) 325 MG tablet Take 1-2 tablets (325-650 mg total) by mouth every 4 (four) hours as needed for mild pain. 11/19/17   Love, Evlyn KannerPamela S, PA-C  atorvastatin (LIPITOR) 40 MG tablet TAKE 1 TABLET BY MOUTH  DAILY Patient taking  differently: Take 40 mg by mouth daily at 6 PM.  07/16/18   Wendall StadeNishan, Peter C, MD  cetirizine (ZYRTEC) 10 MG tablet Take 10 mg by mouth daily.     [provider]  clopidogrel (PLAVIX) 75 MG tablet TAKE 1 TABLET BY MOUTH  DAILY WITH BREAKFAST Patient taking differently: Take 75 mg by mouth daily.  06/19/18   Wendall StadeNishan, Peter C, MD  Flax OIL Take 1,300 mg by mouth daily.     [provider]  fluticasone (FLONASE) 50 MCG/ACT nasal spray Place 2 sprays into both nostrils daily as needed for allergies or rhinitis. 11/19/17   Love, Evlyn KannerPamela S, PA-C  folic acid (FOLVITE) 1 MG tablet TAKE 1 TABLET BY MOUTH  EVERY DAY Patient taking differently: Take 1 mg by mouth daily.  02/03/18   Wendall StadeNishan, Peter C, MD  metoprolol tartrate (LOPRESSOR) 50 MG tablet TAKE 1 TABLET BY MOUTH TWO  TIMES DAILY Patient taking differently: Take 50 mg by mouth 2 (two) times daily.  03/03/18   Wendall StadeNishan, Peter C, MD  Omega-3 Fatty Acids (FISH OIL) 1200 MG CAPS Take 1,200 mg by mouth daily.    [provider]  tamsulosin (FLOMAX) 0.4 MG CAPS capsule Take 1 capsule (0.4 mg total) by mouth daily after supper. 01/21/18   Ranelle OysterSwartz, Zachary T, MD    Family History Family History  Problem Relation Age of Onset  . Coronary artery disease Mother   . Heart disease Mother   . Stroke Father   . Heart disease Father   . Heart disease Brother     Social History Social History   Tobacco Use  . Smoking status: Former Smoker    Packs/day: 0.75    Years: 50.00    Pack years: 37.50    Types: Cigarettes    Last attempt to quit: 03/19/2013    Years since quitting: 5.4  . Smokeless tobacco: Never Used  Substance Use Topics  . Alcohol use: Yes    Alcohol/week: 1.0 standard drinks    Types: 1 Cans of beer per week    Comment: does not drink anymore  . Drug use: No     Allergies   Sulfonamide derivatives   Review of Systems Review of Systems  Unable to perform ROS: Dementia     Physical Exam Updated Vital Signs BP  126/76   Pulse 64   Temp (!) 97.5 F (36.4 C) (Oral)   Resp 18   Ht 1.803 m (5\' 11" )   Wt 61.2 kg   SpO2 100%   BMI 18.83 kg/m   Physical Exam Vitals signs and nursing note reviewed.  Constitutional:      General: He is not in acute distress.    Appearance: Normal appearance. He is well-developed. He is not toxic-appearing.  HENT:     Head: Normocephalic and atraumatic.  Eyes:     General: Lids are normal.     Conjunctiva/sclera: Conjunctivae normal.  Pupils: Pupils are equal, round, and reactive to light.  Neck:     Musculoskeletal: Normal range of motion and neck supple. Spinous process tenderness and muscular tenderness present.     Thyroid: No thyroid mass.     Trachea: No tracheal deviation.   Cardiovascular:     Rate and Rhythm: Normal rate and regular rhythm.     Heart sounds: Normal heart sounds. No murmur. No gallop.   Pulmonary:     Effort: Pulmonary effort is normal. No respiratory distress.     Breath sounds: Normal breath sounds. No stridor. No decreased breath sounds, wheezing, rhonchi or rales.  Abdominal:     General: Bowel sounds are normal. There is no distension.     Palpations: Abdomen is soft.     Tenderness: There is no abdominal tenderness. There is no rebound.  Musculoskeletal: Normal range of motion.        General: No tenderness.     Left elbow: He exhibits no deformity.       Arms:  Skin:    General: Skin is warm and dry.     Findings: No abrasion or rash.  Neurological:     Mental Status: He is alert. Mental status is at baseline.     GCS: GCS eye subscore is 4. GCS verbal subscore is 5. GCS motor subscore is 6.     Cranial Nerves: No cranial nerve deficit.     Sensory: No sensory deficit.     Motor: No tremor or abnormal muscle tone.      ED Treatments / Results  Labs (all labs ordered are listed, but only abnormal results are displayed) Labs Reviewed - No data to display  EKG None  Radiology No results  found.  Procedures Procedures (including critical care time)  Medications Ordered in ED Medications - No data to display   Initial Impression / Assessment and Plan / ED Course  I have reviewed the triage vital signs and the nursing notes.  Pertinent labs & imaging results that were available during my care of the patient were reviewed by me and considered in my medical decision making (see chart for details).     Acute abdominal series consistent with constipation.  Patient is elbow x-ray negative.  CT head and cervical spine without acute injury.  Patient stable for discharge  Final Clinical Impressions(s) / ED Diagnoses   Final diagnoses:  None    ED Discharge Orders    None       Lorre NickAllen, Malisha Mabey, MD 08/31/18 1432

## 2018-08-31 NOTE — ED Triage Notes (Signed)
Pt brought in by [facility] from [facility] for an unwitnessed fall. Per EMS pt nonambulatory, but attempted to get out of his wheelchair and fell. Pt states he hit his head. Pt is c/o hip pain and rib pain, skin tear noted to left elbow. Per EMS pt has hx of dementia.

## 2018-08-31 NOTE — ED Notes (Signed)
Called ptar for pt transport back to [facility].

## 2018-08-31 NOTE — ED Notes (Signed)
Returned from xray

## 2018-08-31 NOTE — ED Notes (Signed)
Patient transported to CT 

## 2018-09-04 LAB — CUP PACEART REMOTE DEVICE CHECK
Date Time Interrogation Session: 20191212223937
Implantable Pulse Generator Implant Date: 20190326

## 2018-09-09 ENCOUNTER — Encounter: Payer: Self-pay | Admitting: Nurse Practitioner

## 2018-09-09 ENCOUNTER — Ambulatory Visit: Payer: Medicare Other | Admitting: Nurse Practitioner

## 2018-09-09 VITALS — BP 140/70 | HR 55 | Ht 71.0 in | Wt 139.1 lb

## 2018-09-09 DIAGNOSIS — R079 Chest pain, unspecified: Secondary | ICD-10-CM | POA: Diagnosis not present

## 2018-09-09 DIAGNOSIS — I2510 Atherosclerotic heart disease of native coronary artery without angina pectoris: Secondary | ICD-10-CM | POA: Diagnosis not present

## 2018-09-09 DIAGNOSIS — I2583 Coronary atherosclerosis due to lipid rich plaque: Secondary | ICD-10-CM

## 2018-09-09 DIAGNOSIS — I10 Essential (primary) hypertension: Secondary | ICD-10-CM | POA: Diagnosis not present

## 2018-09-09 NOTE — Patient Instructions (Addendum)
We will be checking the following labs today - NONE   Medication Instructions:    Continue with your current medicines.    If you need a refill on your cardiac medications before your next appointment, please call your pharmacy.     Testing/Procedures To Be Arranged:  Echo planned for March   Follow-Up:   See Dr. Eden Emms in March as planned (has recall)    At [facility], you and your health needs are our priority.  As part of our continuing mission to provide you with exceptional heart care, we have created designated Provider Care Teams.  These Care Teams include your primary Cardiologist (physician) and Advanced Practice Providers (APPs -  Physician Assistants and Nurse Practitioners) who all work together to provide you with the care you need, when you need it.  Special Instructions:  . None  Call the [facility] office at [phone] if you have any questions, problems or concerns.

## 2018-09-09 NOTE — Progress Notes (Signed)
CARDIOLOGY OFFICE NOTE  Date:  09/09/2018    Thomas [name]. Date of Birth: 1941/06/20 Medical Record #[id number]  PCP:  Shirline Frees, NP  Cardiologist:  Eden Emms  Chief Complaint  Patient presents with  . Chest Pain    Post ER follow up - seen for Dr. Eden Emms    History of Present Illness: Thomas [name]. is a 78 y.o. male who presents today for a post ER visit. Seen for Dr. Eden Emms.   He has a history of CAD with remote CABG in 1999, remote smoker, prior stroke in 2014 and 2019, chronic balance issues with noted falls, CKD, HTN, and HLD. He has AS - last echo in 10/2017 showing mild AS. He does have a loop recorder in place since the stroke of 2019. Has known carotid disease as well.   Last seen here in March of 2019.   Noted that he has been moved to a memory care unit at [facility] and has worsening cognitive decline and vascular dementia.   Presented last month to the ER with several days of atypical chest pain. Negative evaluation. Loop recorder was interrogated that showed possibly some PACs.  Otherwise unremarkable.  Was asked to follow up here. Has been back to the ER earlier this month - fell out of his wheelchair and had a contusion on his elbow.    Comes in today. Here with his wife. He is in a wheelchair. She provides most of the history. He has had now for several weeks a "stabbing" fleeting pain that "moves all over the place". Sometimes it is in his chest, in his shoulder, down in his belly, his side, his back, etc." It lasts for just a few seconds. He has no other associated symptoms. He has not complained of shortness of breath. No nausea or diaphoresis.  There has been concern that this is muscle spasm from prior stroke. He has lost considerable amount of weight. Not dizzy or lightheaded. Has had recent abdominal ultrasound - done at the facility - she has not heard about the results.   Past Medical History:  Diagnosis Date  . CHF (congestive heart  failure) (HCC) 1999  . CKD (chronic kidney disease), stage II   . Coronary artery disease   . Hx of colonic polyps   . Hyperlipidemia   . Hyperplastic colon polyp 04/15/2007  . Hypertension   . Prostatic hypertrophy, benign    with elevated PSA  . Stroke (HCC)   . TIA (transient ischemic attack) 2003   "mini stroke" (03/19/2013)    Past Surgical History:  Procedure Laterality Date  . CARDIAC CATHETERIZATION    . CATARACT EXTRACTION W/ INTRAOCULAR LENS  IMPLANT, BILATERAL Bilateral 1998  . COLONOSCOPY  2008  . CORONARY ARTERY BYPASS GRAFT  1999   x4  . CYSTOSCOPY WITH INSERTION OF UROLIFT N/A 05/26/2018   Procedure: CYSTOSCOPY WITH INSERTION OF UROLIFT X 6;  Surgeon: Crista Elliot, MD;  Location: [facility];  Service: Urology;  Laterality: N/A;  . LOOP RECORDER INSERTION N/A 11/05/2017   Procedure: LOOP RECORDER INSERTION;  Surgeon: Hillis Range, MD;  Location: [facility] INVASIVE CV LAB;  Service: Cardiovascular;  Laterality: N/A;  . TEE WITHOUT CARDIOVERSION N/A 11/05/2017   Procedure: TRANSESOPHAGEAL ECHOCARDIOGRAM (TEE) WITH LOOP;  Surgeon: Lars Masson, MD;  Location: [facility] ENDOSCOPY;  Service: Cardiovascular;  Laterality: N/A;     Medications: Current Meds  Medication Sig  . acetaminophen (TYLENOL) 325 MG tablet Take  1-2 tablets (325-650 mg total) by mouth every 4 (four) hours as needed for mild pain.  Marland Kitchen. atorvastatin (LIPITOR) 40 MG tablet TAKE 1 TABLET BY MOUTH  DAILY  . cetirizine (ZYRTEC) 10 MG tablet Take 10 mg by mouth daily.   . clopidogrel (PLAVIX) 75 MG tablet TAKE 1 TABLET BY MOUTH  DAILY WITH BREAKFAST  . divalproex (DEPAKOTE SPRINKLE) 125 MG capsule Take 1 capsule by mouth 2 (two) times daily.  . Flax OIL Take 1,300 mg by mouth daily.   . fluticasone (FLONASE) 50 MCG/ACT nasal spray Place 2 sprays into both nostrils daily as needed for allergies or rhinitis.  . folic acid (FOLVITE) 1 MG tablet TAKE 1 TABLET BY MOUTH  EVERY DAY  . gabapentin (NEURONTIN) 100 MG capsule  Take 1 capsule by mouth 2 (two) times daily.  . Iron-Vitamins (GERITOL PO) Take 1 tablet by mouth daily.  . metoprolol tartrate (LOPRESSOR) 50 MG tablet TAKE 1 TABLET BY MOUTH TWO  TIMES DAILY  . Omega-3 Fatty Acids (FISH OIL) 1200 MG CAPS Take 1,200 mg by mouth daily.  . sertraline (ZOLOFT) 50 MG tablet Take 50 mg by mouth daily.  . tamsulosin (FLOMAX) 0.4 MG CAPS capsule Take 1 capsule (0.4 mg total) by mouth daily after supper.     Allergies: Allergies  Allergen Reactions  . Sulfonamide Derivatives Other (See Comments)    If taken orally causes weakness, lethargic    Social History: The patient  reports that he quit smoking about 5 years ago. His smoking use included cigarettes. He has a 37.50 pack-year smoking history. He has never used smokeless tobacco. He reports current alcohol use of about 1.0 standard drinks of alcohol per week. He reports that he does not use drugs.   Family History: The patient's family history includes Coronary artery disease in his mother; Heart disease in his brother, father, and mother; Stroke in his father.   Review of Systems: Please see the history of present illness.   Otherwise, the review of systems is positive for none.   All other systems are reviewed and negative.   Physical Exam: VS:  Pulse (!) 55   Ht 5\' 11"  (1.803 m)   Wt 139 lb 1.9 oz (63.1 kg)   SpO2 99%   BMI 19.40 kg/m  .  BMI Body mass index is 19.4 kg/m.  Wt Readings from Last 3 Encounters:  09/09/18 139 lb 1.9 oz (63.1 kg)  08/31/18 135 lb (61.2 kg)  05/26/18 160 lb 6 oz (72.7 kg)    General: Elderly. Alert and in no acute distress. Quite thin. Weight is down 21 pounds since October.  Down almost 30 pounds since last visit here.  HEENT: Normal.  Neck: Supple, no JVD, carotid bruits, or masses noted.  Cardiac: Regular rate and rhythm. Soft outflow murmur. No edema.  Respiratory:  Lungs are clear to auscultation bilaterally with normal work of breathing.  GI: Soft and  nontender.  MS: No deformity or atrophy. Gait not tested.  Skin: Warm and dry. Color is somewhat sallow. Neuro:  Strength and sensation are intact and no gross focal deficits noted.  Psych: Alert, appropriate and with normal affect.   LABORATORY DATA:  EKG:  EKG is not ordered today.  Lab Results  Component Value Date   WBC 10.0 08/06/2018   HGB 12.9 (L) 08/06/2018   HCT 38.4 (L) 08/06/2018   PLT 273 08/06/2018   GLUCOSE 124 (H) 08/06/2018   CHOL 138 11/04/2017   TRIG 80  11/04/2017   HDL 42 11/04/2017   LDLCALC 80 11/04/2017   ALT 10 05/05/2018   AST 18 05/05/2018   NA 134 (L) 08/06/2018   K 3.5 08/06/2018   CL 101 08/06/2018   CREATININE 0.96 08/06/2018   BUN 12 08/06/2018   CO2 23 08/06/2018   TSH 4.307 01/27/2018   PSA 2.75 11/15/2016   INR 1.13 11/03/2017   HGBA1C 5.8 (H) 11/04/2017     BNP (last 3 results) Recent Labs    11/04/17 0539  BNP 202.7*    ProBNP (last 3 results) No results for input(s): PROBNP in the last 8760 hours.   Other Studies Reviewed Today:  Echo Study Conclusions 10/2017  - Left ventricle: The cavity size was normal. Wall thickness was   increased in a pattern of severe LVH. Systolic function was   normal. The estimated ejection fraction was in the range of 55%   to 60%. - Aortic valve: There was mild stenosis. There was mild   regurgitation. Valve area (VTI): 2.23 cm^2. Valve area (Vmax):   1.55 cm^2. Valve area (Vmean): 1.53 cm^2. - Left atrium: The atrium was moderately dilated. - Atrial septum: No defect or patent foramen ovale was identified.  Assessment/Plan:  1. Chest pain - very atypical - having fleeting stabbing pain that moves all over the body - do not feel this is cardiac. Wife is really not interested in stress testing and I do not feel this is needed - would favor medical management given his other issues, if he were to have future cardiac symptoms.   Favor conservative approach.   2. Known CAD:  with remote  CABG 1999 - Non ischemic Myoview from 2009 - he has been managed medically.  See #1.   3. HLD - on statin therapy.   4. HTN - BP is fair. 140/70 by me. No changes made today. If weight loss continues, he may need his medicines adjusted down.   5. Prior stroke - on Plavix - has loop in place - no AF noted so far.   6. Progressive memory loss/vascular dementia - favor conservative approach. He has now been placed in a memory unit.   7. Aortic stenosis - was to have echo and return OV with Dr. Eden Emms in March. WIfe is considering not proceeding on with echo and favoring conservative approach moving forward.   8. Significant weight loss - defer to [facility] provider.   Current medicines are reviewed with the patient today.  The patient does not have concerns regarding medicines other than what has been noted above.  The following changes have been made:  See above.  Labs/ tests ordered today include:   No orders of the defined types were placed in this encounter.    Disposition:   FU with Dr. Eden Emms as planned.    Patient is agreeable to this plan and will call if any problems develop in the interim.   SignedNorma Fredrickson, NP  09/09/2018 5:56 PM  [facility] [address] [place], [place]  [address] Phone: [phone] Fax: [phone]

## 2018-09-10 ENCOUNTER — Telehealth: Payer: Self-pay | Admitting: *Deleted

## 2018-09-10 NOTE — Telephone Encounter (Signed)
S/w [facility] where pt resides @ [phone].  Will fax pt's ov note to [phone] via Epic.

## 2018-09-29 ENCOUNTER — Ambulatory Visit (INDEPENDENT_AMBULATORY_CARE_PROVIDER_SITE_OTHER): Payer: Medicare Other

## 2018-09-29 DIAGNOSIS — I639 Cerebral infarction, unspecified: Secondary | ICD-10-CM

## 2018-09-29 LAB — CUP PACEART REMOTE DEVICE CHECK
Date Time Interrogation Session: 20200216231059
Implantable Pulse Generator Implant Date: 20190326

## 2018-10-08 NOTE — Progress Notes (Signed)
Carelink Summary Report / Loop Recorder 

## 2018-10-15 ENCOUNTER — Other Ambulatory Visit (HOSPITAL_COMMUNITY): Payer: Medicare Other

## 2018-10-31 ENCOUNTER — Other Ambulatory Visit: Payer: Self-pay

## 2018-10-31 ENCOUNTER — Ambulatory Visit (INDEPENDENT_AMBULATORY_CARE_PROVIDER_SITE_OTHER): Payer: Medicare Other | Admitting: *Deleted

## 2018-10-31 DIAGNOSIS — I639 Cerebral infarction, unspecified: Secondary | ICD-10-CM

## 2018-11-01 LAB — CUP PACEART REMOTE DEVICE CHECK
Date Time Interrogation Session: 20200320234042
Implantable Pulse Generator Implant Date: 20190326

## 2018-11-05 ENCOUNTER — Other Ambulatory Visit: Payer: Self-pay

## 2018-11-05 ENCOUNTER — Emergency Department (HOSPITAL_COMMUNITY): Payer: Medicare Other

## 2018-11-05 ENCOUNTER — Emergency Department (HOSPITAL_COMMUNITY)
Admission: EM | Admit: 2018-11-05 | Discharge: 2018-11-05 | Disposition: A | Discharge status: Home / Self Care | Payer: Medicare Other | Attending: Emergency Medicine | Admitting: Emergency Medicine

## 2018-11-05 ENCOUNTER — Encounter (HOSPITAL_COMMUNITY): Payer: Self-pay | Admitting: Emergency Medicine

## 2018-11-05 DIAGNOSIS — Y999 Unspecified external cause status: Secondary | ICD-10-CM | POA: Diagnosis not present

## 2018-11-05 DIAGNOSIS — I130 Hypertensive heart and chronic kidney disease with heart failure and stage 1 through stage 4 chronic kidney disease, or unspecified chronic kidney disease: Secondary | ICD-10-CM | POA: Insufficient documentation

## 2018-11-05 DIAGNOSIS — I5032 Chronic diastolic (congestive) heart failure: Secondary | ICD-10-CM | POA: Insufficient documentation

## 2018-11-05 DIAGNOSIS — Y939 Activity, unspecified: Secondary | ICD-10-CM | POA: Diagnosis not present

## 2018-11-05 DIAGNOSIS — W19XXXA Unspecified fall, initial encounter: Secondary | ICD-10-CM

## 2018-11-05 DIAGNOSIS — Z79899 Other long term (current) drug therapy: Secondary | ICD-10-CM | POA: Insufficient documentation

## 2018-11-05 DIAGNOSIS — F0151 Vascular dementia with behavioral disturbance: Secondary | ICD-10-CM | POA: Diagnosis not present

## 2018-11-05 DIAGNOSIS — W06XXXA Fall from bed, initial encounter: Secondary | ICD-10-CM | POA: Insufficient documentation

## 2018-11-05 DIAGNOSIS — Y92122 Bedroom in nursing home as the place of occurrence of the external cause: Secondary | ICD-10-CM | POA: Diagnosis not present

## 2018-11-05 DIAGNOSIS — Z87891 Personal history of nicotine dependence: Secondary | ICD-10-CM | POA: Diagnosis not present

## 2018-11-05 DIAGNOSIS — N183 Chronic kidney disease, stage 3 (moderate): Secondary | ICD-10-CM | POA: Diagnosis not present

## 2018-11-05 DIAGNOSIS — S0990XA Unspecified injury of head, initial encounter: Secondary | ICD-10-CM | POA: Diagnosis present

## 2018-11-05 NOTE — ED Triage Notes (Signed)
Pt from Memory care center.  Pt fell from bed, hit head on his bedside dresser.  Pt on plavix.  Pt has approx. 2 cm lac to left eye lid.  A lamp fell and the bulb broke, pt suffered left leg lac from the glass.  Abrasion also noted to mid back.  Pt is known to [employer]" when moved at all.  Pt remembers incident.  Pt has wife that is involved in his care.

## 2018-11-05 NOTE — ED Notes (Signed)
Pt returned from CT °

## 2018-11-05 NOTE — ED Provider Notes (Signed)
[name] EMERGENCY DEPARTMENT Provider Note   CSN: [id number] Arrival date & time: 11/05/18  4627    History   Chief Complaint Chief Complaint  Patient presents with  . Fall    HPI Thomas [name]. is a 78 y.o. male.     HPI Patient is a 78 year old male presents emergency department after a fall today out of his bed.  He has a history of dementia and resides at the nursing home.  His dementia is mild.  He struck his head on the bedside dresser.  Unknown loss of consciousness.  No other recent illness.  No vomiting.  No complaints of pain in his upper or lower extremity major joints   Past Medical History:  Diagnosis Date  . CHF (congestive heart failure) (HCC) 1999  . CKD (chronic kidney disease), stage II   . Coronary artery disease   . Hx of colonic polyps   . Hyperlipidemia   . Hyperplastic colon polyp 04/15/2007  . Hypertension   . Prostatic hypertrophy, benign    with elevated PSA  . Stroke (HCC)   . TIA (transient ischemic attack) 2003   "mini stroke" (03/19/2013)    Patient Active Problem List   Diagnosis Date Noted  . Delirium 01/26/2018  . 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 Foul smelling urine   . Leukocytosis   . Hyponatremia   . 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 Benign essential HTN   . Coronary artery disease involving native coronary artery of native heart without angina pectoris   . Stage 3 chronic kidney disease (HCC)   . Acute lower UTI   . Seasonal allergies   . Stroke (HCC) 11/04/2017  . HLD (hyperlipidemia) 11/04/2017  . Chronic diastolic CHF (congestive heart failure) (HCC) 11/04/2017  . Acute renal failure superimposed on stage 2 chronic kidney disease (HCC) 11/04/2017  . Acute metabolic encephalopathy 11/04/2017  . UTI (urinary tract infection) 11/04/2017  . AKI (acute kidney injury) (HCC)   . History of stroke   . Prediabetes   . Left hemiparesis (HCC) 03/19/2013  .  Cerebral infarction (HCC) 03/19/2013  . Left leg weakness 03/19/2013  . CAD, ARTERY BYPASS GRAFT 09/30/2008  . 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 Borderline hyperglycemia 12/03/2007  . Other and unspecified hyperlipidemia 07/28/2007  . Essential hypertension 07/28/2007  . 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 Coronary atherosclerosis 07/28/2007  . BENIGN PROSTATIC HYPERTROPHY 07/28/2007  . COLONIC POLYPS, 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    Past Surgical History:  Procedure Laterality Date  . CARDIAC CATHETERIZATION    . CATARACT EXTRACTION W/ INTRAOCULAR LENS  IMPLANT, BILATERAL Bilateral 1998  . COLONOSCOPY  2008  . CORONARY ARTERY BYPASS GRAFT  1999   x4  . CYSTOSCOPY WITH INSERTION OF UROLIFT N/A 05/26/2018   Procedure: CYSTOSCOPY WITH INSERTION OF UROLIFT X 6;  Surgeon: Crista Elliot, MD;  Location: [facility] ORS;  Service: Urology;  Laterality: N/A;  . LOOP RECORDER INSERTION N/A 11/05/2017   Procedure: LOOP RECORDER INSERTION;  Surgeon: Hillis Range, MD;  Location: [facility] INVASIVE CV LAB;  Service: Cardiovascular;  Laterality: N/A;  . TEE WITHOUT CARDIOVERSION N/A 11/05/2017   Procedure: TRANSESOPHAGEAL ECHOCARDIOGRAM (TEE) WITH LOOP;  Surgeon: Lars Masson, MD;  Location: [facility] ENDOSCOPY;  Service: Cardiovascular;  Laterality: N/A;        Home Medications    Prior to Admission medications   Medication Sig Start Date End Date Taking? Authorizing Provider  acetaminophen (  TYLENOL) 325 MG tablet Take 1-2 tablets (325-650 mg total) by mouth every 4 (four) hours as needed for mild pain. 11/19/17   Love, Evlyn Kanner, PA-C  atorvastatin (LIPITOR) 40 MG tablet TAKE 1 TABLET BY MOUTH  DAILY 07/16/18   Wendall Stade, MD  cetirizine (ZYRTEC) 10 MG tablet Take 10 mg by mouth daily.     [provider]  clopidogrel (PLAVIX) 75 MG tablet TAKE 1 TABLET BY MOUTH  DAILY WITH BREAKFAST 06/19/18   Wendall Stade, MD  divalproex (DEPAKOTE SPRINKLE) 125 MG capsule Take 1 capsule by mouth 2 (two) times  daily. 09/04/18   [provider]  Flax OIL Take 1,300 mg by mouth daily.     [provider]  fluticasone (FLONASE) 50 MCG/ACT nasal spray Place 2 sprays into both nostrils daily as needed for allergies or rhinitis. 11/19/17   Love, Evlyn Kanner, PA-C  folic acid (FOLVITE) 1 MG tablet TAKE 1 TABLET BY MOUTH  EVERY DAY 02/03/18   Wendall Stade, MD  gabapentin (NEURONTIN) 100 MG capsule Take 1 capsule by mouth 2 (two) times daily. 09/07/18   [provider]  Iron-Vitamins (GERITOL PO) Take 1 tablet by mouth daily.    [provider]  metoprolol tartrate (LOPRESSOR) 50 MG tablet TAKE 1 TABLET BY MOUTH TWO  TIMES DAILY 03/03/18   Wendall Stade, MD  Omega-3 Fatty Acids (FISH OIL) 1200 MG CAPS Take 1,200 mg by mouth daily.    [provider]  sertraline (ZOLOFT) 50 MG tablet Take 50 mg by mouth daily.    [provider]  tamsulosin (FLOMAX) 0.4 MG CAPS capsule Take 1 capsule (0.4 mg total) by mouth daily after supper. 01/21/18   Ranelle Oyster, MD    Family History Family History  Problem Relation Age of Onset  . Coronary artery disease Mother   . Heart disease Mother   . Stroke Father   . Heart disease Father   . Heart disease Brother     Social History Social History   Tobacco Use  . Smoking status: Former Smoker    Packs/day: 0.75    Years: 50.00    Pack years: 37.50    Types: Cigarettes    Last attempt to quit: 03/19/2013    Years since quitting: 5.6  . Smokeless tobacco: Never Used  Substance Use Topics  . Alcohol use: Yes    Alcohol/week: 1.0 standard drinks    Types: 1 Cans of beer per week    Comment: does not drink anymore  . Drug use: No     Allergies   Sulfonamide derivatives   Review of Systems Review of Systems  All other systems reviewed and are negative.    Physical Exam Updated Vital Signs BP 109/66 (BP Location: Right Arm)   Pulse 66   Temp 98.3 F (36.8 C) (Oral)   Resp 16   Ht  (1.753 m)    Wt 63 kg   SpO2 100%   BMI 20.51 kg/m   Physical Exam Vitals signs and nursing note reviewed.  Constitutional:      Appearance: He is well-developed.  HENT:     Head: Normocephalic.     Comments: Superficial abrasion of the left eyebrow and left upper eyelid with some mild bruising to the left forehead. Eyes:     Extraocular Movements: Extraocular movements intact.     Pupils: Pupils are equal, round, and reactive to light.  Neck:  Musculoskeletal: Normal range of motion and neck supple.     Comments: C-spine nontender Cardiovascular:     Rate and Rhythm: Normal rate and regular rhythm.     Heart sounds: Normal heart sounds.  Pulmonary:     Effort: Pulmonary effort is normal. No respiratory distress.     Breath sounds: Normal breath sounds.  Abdominal:     General: There is no distension.     Palpations: Abdomen is soft.     Tenderness: There is no abdominal tenderness.  Musculoskeletal: Normal range of motion.     Comments: Full range of motion of bilateral upper and lower extremity major joints.  5 out of 5 strength in bilateral upper and lower extremity major muscle groups.  Small skin tear left anterior knee without bleeding.  No thoracic or lumbar tenderness  Skin:    General: Skin is warm and dry.  Neurological:     General: No focal deficit present.     Mental Status: He is alert.     Comments: Alert.  Follows commands.  Moves all 4 extremities equally.  Oriented to person and place but not time  Psychiatric:        Mood and Affect: Mood normal.        Behavior: Behavior normal.      ED Treatments / Results  Labs (all labs ordered are listed, but only abnormal results are displayed) Labs Reviewed - No data to display  EKG None  Radiology Ct Head Wo Contrast  Result Date: 11/05/2018 CLINICAL DATA:  Status post fall with small abrasion to the left eyebrow. EXAM: CT HEAD WITHOUT CONTRAST CT CERVICAL SPINE WITHOUT CONTRAST TECHNIQUE: Multidetector CT  imaging of the head and cervical spine was performed following the standard protocol without intravenous contrast. Multiplanar CT image reconstructions of the cervical spine were also generated. COMPARISON:  08/31/2018 FINDINGS: CT HEAD FINDINGS Brain: No evidence of acute infarction, hemorrhage, hydrocephalus, extra-axial collection or mass lesion/mass effect. Marked brain parenchymal volume loss with stable ex vacuo dilatation of the ventricles. Marked deep white matter microangiopathy. Vascular: Calcific atherosclerotic disease. Skull: Normal. Negative for fracture or focal lesion. Sinuses/Orbits: No acute finding. Other: None. CT CERVICAL SPINE FINDINGS Alignment: Normal. Skull base and vertebrae: No acute fracture. No primary bone lesion or focal pathologic process. Soft tissues and spinal canal: No prevertebral fluid or swelling. No visible canal hematoma. Disc levels: Multilevel osteoarthritic changes and posterior facet arthropathy. Upper chest: Negative. Other: None. IMPRESSION: 1. No acute intracranial abnormality. 2. Marked brain parenchymal atrophy and chronic microvascular disease. 3. No evidence of acute traumatic injury to cervical spine. 4. 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 Electronically Signed   By: Ted Mcalpine M.D.   On: 11/05/2018 11:26   Ct Cervical Spine Wo Contrast  Result Date: 11/05/2018 CLINICAL DATA:  Status post fall with small abrasion to the left eyebrow. EXAM: CT HEAD WITHOUT CONTRAST CT CERVICAL SPINE WITHOUT CONTRAST TECHNIQUE: Multidetector CT imaging of the head and cervical spine was performed following the standard protocol without intravenous contrast. Multiplanar CT image reconstructions of the cervical spine were also generated. COMPARISON:  08/31/2018 FINDINGS: CT HEAD FINDINGS Brain: No evidence of acute infarction, hemorrhage, hydrocephalus, extra-axial collection or mass  lesion/mass effect. Marked brain parenchymal volume loss with stable ex vacuo dilatation of the ventricles. Marked deep white matter microangiopathy. Vascular: Calcific atherosclerotic disease. Skull: Normal. Negative for fracture or focal lesion. Sinuses/Orbits: No acute finding. Other: None.  CT CERVICAL SPINE FINDINGS Alignment: Normal. Skull base and vertebrae: No acute fracture. No primary bone lesion or focal pathologic process. Soft tissues and spinal canal: No prevertebral fluid or swelling. No visible canal hematoma. Disc levels: Multilevel osteoarthritic changes and posterior facet arthropathy. Upper chest: Negative. Other: None. IMPRESSION: 1. No acute intracranial abnormality. 2. Marked brain parenchymal atrophy and chronic microvascular disease. 3. No evidence of acute traumatic injury to cervical spine. 4. 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 Electronically Signed   By: Ted Mcalpine M.D.   On: 11/05/2018 11:26    Procedures Procedures (including critical care time)  Medications Ordered in ED Medications - No data to display   Initial Impression / Assessment and Plan / ED Course  I have reviewed the triage vital signs and the nursing notes.  Pertinent labs & imaging results that were available during my care of the patient were reviewed by me and considered in my medical decision making (see chart for details).       Radiology Films Personally Reviewed: I personally viewed the patient's CT head CT C-spine which demonstrates atrophy and chronic ischemic changes without acute traumatic injury  Consultants: None  Collateral Information:  EMS: History regarding nursing home report provided by EMS  Old Records: Old records were reviewed.  Patient is on Plavix  Family: Spoke with patient's spouse who reports that he has had no recent illness.  She reported the nursing facility told her that he  fell when trying to get out of bed   11:58 AM CT imaging without acute abnormality.  Patient and family updated.  I spoke with the patient's spouse.  All questions answered. Home with standard head injury warnings.   Final Clinical Impressions(s) / ED Diagnoses   Final diagnoses:  Injury of head, initial encounter  Fall, initial encounter    ED Discharge Orders    None       Azalia Bilis, MD 11/05/18 1159

## 2018-11-05 NOTE — ED Notes (Signed)
[facility] called @1149 -per Ladona Ridgel, RN-called by Marylene Land

## 2018-11-06 NOTE — Progress Notes (Signed)
Carelink Summary Report / Loop Recorder 

## 2018-12-03 ENCOUNTER — Other Ambulatory Visit: Payer: Self-pay

## 2018-12-03 ENCOUNTER — Ambulatory Visit (INDEPENDENT_AMBULATORY_CARE_PROVIDER_SITE_OTHER): Payer: Medicare Other | Admitting: *Deleted

## 2018-12-03 DIAGNOSIS — I639 Cerebral infarction, unspecified: Secondary | ICD-10-CM | POA: Diagnosis not present

## 2018-12-04 LAB — CUP PACEART REMOTE DEVICE CHECK
Date Time Interrogation Session: 20200423003810
Implantable Pulse Generator Implant Date: 20190326

## 2018-12-05 ENCOUNTER — Telehealth: Payer: Self-pay

## 2018-12-05 NOTE — Telephone Encounter (Signed)
Spoke with patient regarding disconnected monitor 

## 2018-12-10 ENCOUNTER — Telehealth: Payer: Self-pay | Admitting: Cardiovascular Disease

## 2018-12-10 NOTE — Telephone Encounter (Signed)
New message      Called to schedule virtual visit with Dr Eden Emms.  Wife said pt is in "memory care" at a assisted living facility and is not able to cooperate.

## 2018-12-11 NOTE — Progress Notes (Signed)
Carelink Summary Report / Loop Recorder 

## 2018-12-15 ENCOUNTER — Ambulatory Visit: Payer: Medicare Other | Admitting: Cardiovascular Disease

## 2018-12-17 NOTE — Telephone Encounter (Signed)
Put in recall when patient will be able to come back into the office to do a person to person office visit.

## 2019-01-06 ENCOUNTER — Ambulatory Visit (INDEPENDENT_AMBULATORY_CARE_PROVIDER_SITE_OTHER): Payer: Medicare Other | Admitting: *Deleted

## 2019-01-06 DIAGNOSIS — I639 Cerebral infarction, unspecified: Secondary | ICD-10-CM

## 2019-01-07 LAB — CUP PACEART REMOTE DEVICE CHECK
Date Time Interrogation Session: 20200526003649
Implantable Pulse Generator Implant Date: 20190326

## 2019-01-08 ENCOUNTER — Other Ambulatory Visit: Payer: Self-pay

## 2019-01-08 ENCOUNTER — Encounter (HOSPITAL_COMMUNITY): Payer: Self-pay | Admitting: Pharmacy Technician

## 2019-01-08 ENCOUNTER — Emergency Department (HOSPITAL_COMMUNITY)
Admission: EM | Admit: 2019-01-08 | Discharge: 2019-01-08 | Disposition: A | Discharge status: Home / Self Care | Attending: Emergency Medicine | Admitting: Emergency Medicine

## 2019-01-08 ENCOUNTER — Emergency Department (HOSPITAL_COMMUNITY)

## 2019-01-08 DIAGNOSIS — W06XXXA Fall from bed, initial encounter: Secondary | ICD-10-CM | POA: Diagnosis not present

## 2019-01-08 DIAGNOSIS — Z7901 Long term (current) use of anticoagulants: Secondary | ICD-10-CM | POA: Diagnosis not present

## 2019-01-08 DIAGNOSIS — Z79899 Other long term (current) drug therapy: Secondary | ICD-10-CM | POA: Diagnosis not present

## 2019-01-08 DIAGNOSIS — S0990XA Unspecified injury of head, initial encounter: Secondary | ICD-10-CM | POA: Diagnosis present

## 2019-01-08 DIAGNOSIS — N182 Chronic kidney disease, stage 2 (mild): Secondary | ICD-10-CM | POA: Insufficient documentation

## 2019-01-08 DIAGNOSIS — W19XXXA Unspecified fall, initial encounter: Secondary | ICD-10-CM

## 2019-01-08 DIAGNOSIS — I130 Hypertensive heart and chronic kidney disease with heart failure and stage 1 through stage 4 chronic kidney disease, or unspecified chronic kidney disease: Secondary | ICD-10-CM | POA: Insufficient documentation

## 2019-01-08 DIAGNOSIS — I2510 Atherosclerotic heart disease of native coronary artery without angina pectoris: Secondary | ICD-10-CM | POA: Insufficient documentation

## 2019-01-08 DIAGNOSIS — Z951 Presence of aortocoronary bypass graft: Secondary | ICD-10-CM | POA: Diagnosis not present

## 2019-01-08 DIAGNOSIS — Y92129 Unspecified place in nursing home as the place of occurrence of the external cause: Secondary | ICD-10-CM | POA: Diagnosis not present

## 2019-01-08 DIAGNOSIS — Y939 Activity, unspecified: Secondary | ICD-10-CM | POA: Diagnosis not present

## 2019-01-08 DIAGNOSIS — S0101XA Laceration without foreign body of scalp, initial encounter: Secondary | ICD-10-CM | POA: Diagnosis not present

## 2019-01-08 DIAGNOSIS — I5032 Chronic diastolic (congestive) heart failure: Secondary | ICD-10-CM | POA: Diagnosis not present

## 2019-01-08 DIAGNOSIS — Z87891 Personal history of nicotine dependence: Secondary | ICD-10-CM | POA: Insufficient documentation

## 2019-01-08 DIAGNOSIS — Y999 Unspecified external cause status: Secondary | ICD-10-CM | POA: Diagnosis not present

## 2019-01-08 MED ORDER — LIDOCAINE-EPINEPHRINE (PF) 2 %-1:200000 IJ SOLN
INTRAMUSCULAR | Status: AC
  Administered 2019-01-08: 22:00:00
  Filled 2019-01-08: qty 20

## 2019-01-08 MED ORDER — HALOPERIDOL LACTATE 5 MG/ML IJ SOLN
2.0000 mg | Freq: Once | INTRAMUSCULAR | Status: AC
  Administered 2019-01-08: 2 mg via INTRAVENOUS
  Filled 2019-01-08: qty 1

## 2019-01-08 MED ORDER — HALOPERIDOL LACTATE 5 MG/ML IJ SOLN
2.0000 mg | Freq: Once | INTRAMUSCULAR | Status: AC
  Administered 2019-01-08: 19:00:00 2 mg via INTRAVENOUS
  Filled 2019-01-08: qty 1

## 2019-01-08 NOTE — Discharge Instructions (Addendum)
Suture on scalp is absorbable and does not need to be removed.

## 2019-01-08 NOTE — ED Notes (Signed)
All appropriate discharge materials reviewed at length with patient. Time for questions provided. Pt has no other questions at this time and verbalizes understanding of all provided materials.  

## 2019-01-08 NOTE — Care Management (Signed)
CM received a return call from [place] on-call RN confirmed patient is active with [facility] who will follow continue to follow once medically cleared for discharge. ED CM will continue to follow for transitional care plan.

## 2019-01-08 NOTE — ED Notes (Signed)
Attempted to call report at [facility]

## 2019-01-08 NOTE — ED Notes (Signed)
Waiting for ptar 

## 2019-01-08 NOTE — ED Triage Notes (Signed)
Pt bib ems from [facility] after unwitnessed fall. Pt on plavix. Hematoma to forehead and small lac to posterior head. Bleeding controlled. 127/80, HR 67, RR 16, 98% RA, 99.5 temporal. Pt arrives in [facility]. Alert and oriented to baseline (person, place, event).

## 2019-01-08 NOTE — ED Notes (Signed)
md at bedside

## 2019-01-08 NOTE — ED Notes (Signed)
Pt becoming increasingly agitated

## 2019-01-08 NOTE — ED Provider Notes (Signed)
[facility] EMERGENCY DEPARTMENT Provider Note   CSN: [id number] Arrival date & time: 01/08/19  1824    History   Chief Complaint Chief Complaint  Patient presents with   Fall    HPI Jayzeon Whoolery. is a 78 y.o. male.  HPI 78 year old male with a history of vascular dementia, CVA, CHF, CKD, HLD, HTN presents from his nursing facility after an unwitnessed fall.  Patient reports that he fell out of his bed and hit his head.  He has 2 hematomas on the scalp.  Denies extremity injury, chest pain, shortness of breath, or abdominal pain.  He is on Plavix but no other systemic anticoagulation.  Patient is oriented to person and place but not time.  Past Medical History:  Diagnosis Date   CHF (congestive heart failure) (HCC) 1999   CKD (chronic kidney disease), stage II    Coronary artery disease    Hx of colonic polyps    Hyperlipidemia    Hyperplastic colon polyp 04/15/2007   Hypertension    Prostatic hypertrophy, benign    with elevated PSA   Stroke [facility]    TIA (transient ischemic attack) 2003   "mini stroke" (03/19/2013)    Patient Active Problem List   Diagnosis Date Noted   Delirium 01/26/2018   Vascular dementia with behavior disturbance (HCC) 12/24/2017   Foul smelling urine    Leukocytosis    Hyponatremia    Acute right PCA stroke (HCC) 11/06/2017   Benign essential HTN    Coronary artery disease involving native coronary artery of native heart without angina pectoris    Stage 3 chronic kidney disease (HCC)    Acute lower UTI    Seasonal allergies    Stroke (HCC) 11/04/2017   HLD (hyperlipidemia) 11/04/2017   Chronic diastolic CHF (congestive heart failure) (HCC) 11/04/2017   Acute renal failure superimposed on stage 2 chronic kidney disease (HCC) 11/04/2017   Acute metabolic encephalopathy 11/04/2017   UTI (urinary tract infection) 11/04/2017   AKI (acute kidney injury) (HCC)    History of stroke     Prediabetes    Left hemiparesis (HCC) 03/19/2013   Cerebral infarction (HCC) 03/19/2013   Left leg weakness 03/19/2013   CAD, ARTERY BYPASS GRAFT 09/30/2008   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   Borderline hyperglycemia 12/03/2007   Other and unspecified hyperlipidemia 07/28/2007   Essential hypertension 07/28/2007   MYOCARDIAL INFARCTION, 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   Coronary atherosclerosis 07/28/2007   BENIGN PROSTATIC HYPERTROPHY 07/28/2007   COLONIC POLYPS, 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    Past Surgical History:  Procedure Laterality Date   CARDIAC CATHETERIZATION     CATARACT EXTRACTION W/ INTRAOCULAR LENS  IMPLANT, BILATERAL Bilateral 1998   COLONOSCOPY  2008   CORONARY ARTERY BYPASS GRAFT  1999   x4   CYSTOSCOPY WITH INSERTION OF UROLIFT N/A 05/26/2018   Procedure: CYSTOSCOPY WITH INSERTION OF UROLIFT X 6;  Surgeon: Crista Elliot, MD;  Location: [facility] ORS;  Service: Urology;  Laterality: N/A;   LOOP RECORDER INSERTION N/A 11/05/2017   Procedure: LOOP RECORDER INSERTION;  Surgeon: Hillis Range, MD;  Location: [facility] INVASIVE CV LAB;  Service: Cardiovascular;  Laterality: N/A;   TEE WITHOUT CARDIOVERSION N/A 11/05/2017   Procedure: TRANSESOPHAGEAL ECHOCARDIOGRAM (TEE) WITH LOOP;  Surgeon: Lars Masson, MD;  Location: [facility] ENDOSCOPY;  Service: Cardiovascular;  Laterality: N/A;        Home Medications    Prior to Admission medications   Medication Sig Start Date End Date Taking?  Authorizing Provider  acetaminophen (TYLENOL) 325 MG tablet Take 1-2 tablets (325-650 mg total) by mouth every 4 (four) hours as needed for mild pain. 11/19/17   Love, Evlyn KannerPamela S, PA-C  atorvastatin (LIPITOR) 40 MG tablet TAKE 1 TABLET BY MOUTH  DAILY 07/16/18   Wendall StadeNishan, Peter C, MD  cetirizine (ZYRTEC) 10 MG tablet Take 10 mg by mouth daily.     [provider]  clopidogrel (PLAVIX) 75 MG tablet TAKE 1 TABLET BY MOUTH  DAILY WITH BREAKFAST 06/19/18   Wendall StadeNishan, Peter C, MD  divalproex (DEPAKOTE SPRINKLE)  125 MG capsule Take 1 capsule by mouth 2 (two) times daily. 09/04/18   [provider]  Flax OIL Take 1,300 mg by mouth daily.     [provider]  fluticasone (FLONASE) 50 MCG/ACT nasal spray Place 2 sprays into both nostrils daily as needed for allergies or rhinitis. 11/19/17   Love, Evlyn KannerPamela S, PA-C  folic acid (FOLVITE) 1 MG tablet TAKE 1 TABLET BY MOUTH  EVERY DAY 02/03/18   Wendall StadeNishan, Peter C, MD  gabapentin (NEURONTIN) 100 MG capsule Take 1 capsule by mouth 2 (two) times daily. 09/07/18   [provider]  Iron-Vitamins (GERITOL PO) Take 1 tablet by mouth daily.    [provider]  metoprolol tartrate (LOPRESSOR) 50 MG tablet TAKE 1 TABLET BY MOUTH TWO  TIMES DAILY 03/03/18   Wendall StadeNishan, Peter C, MD  Omega-3 Fatty Acids (FISH OIL) 1200 MG CAPS Take 1,200 mg by mouth daily.    [provider]  sertraline (ZOLOFT) 50 MG tablet Take 50 mg by mouth daily.    [provider]  tamsulosin (FLOMAX) 0.4 MG CAPS capsule Take 1 capsule (0.4 mg total) by mouth daily after supper. 01/21/18   Ranelle OysterSwartz, Zachary T, MD    Family History Family History  Problem Relation Age of Onset   Coronary artery disease Mother    Heart disease Mother    Stroke Father    Heart disease Father    Heart disease Brother     Social History Social History   Tobacco Use   Smoking status: Former Smoker    Packs/day: 0.75    Years: 50.00    Pack years: 37.50    Types: Cigarettes    Last attempt to quit: 03/19/2013    Years since quitting: 5.8   Smokeless tobacco: Never Used  Substance Use Topics   Alcohol use: Yes    Alcohol/week: 1.0 standard drinks    Types: 1 Cans of beer per week    Comment: does not drink anymore   Drug use: No     Allergies   Sulfonamide derivatives   Review of Systems Review of Systems  Constitutional: Negative for chills and fever.  HENT: Negative for ear pain and sore throat.   Eyes: Negative for pain and visual disturbance.    Respiratory: Negative for cough and shortness of breath.   Cardiovascular: Negative for chest pain and palpitations.  Gastrointestinal: Negative for abdominal pain and vomiting.  Genitourinary: Negative for dysuria and hematuria.  Musculoskeletal: Negative for arthralgias and back pain.  Skin: Negative for color change and rash.  Neurological: Negative for seizures and syncope.  All other systems reviewed and are negative.    Physical Exam Updated Vital Signs BP 122/67    Pulse 60    Temp 98.8 F (37.1 C) (Oral)    Resp 13    SpO2 100%   Physical Exam Vitals signs and nursing note reviewed.  Constitutional:  Appearance: He is well-developed.  HENT:     Head: Normocephalic.     Comments: Abrasion on the top of his head Abrasions on the left parietal scalp Frontal and posterior parietal hematoma Eyes:     Extraocular Movements: Extraocular movements intact.     Conjunctiva/sclera: Conjunctivae normal.     Pupils: Pupils are equal, round, and reactive to light.  Neck:     Musculoskeletal: Neck supple.  Cardiovascular:     Rate and Rhythm: Normal rate and regular rhythm.     Heart sounds: No murmur.  Pulmonary:     Effort: Pulmonary effort is normal. No respiratory distress.     Breath sounds: Normal breath sounds.  Abdominal:     Palpations: Abdomen is soft.     Tenderness: There is no abdominal tenderness.  Musculoskeletal: Normal range of motion.        General: No tenderness.  Skin:    General: Skin is warm and dry.  Neurological:     General: No focal deficit present.     Mental Status: He is alert.     Comments: 5 out of 5 strength in bilateral upper and lower extremities Following commands       ED Treatments / Results  Labs (all labs ordered are listed, but only abnormal results are displayed) Labs Reviewed - No data to display  EKG None  Radiology Ct Head Wo Contrast  Result Date: 01/08/2019 CLINICAL DATA:  Unwitnessed fall with hematoma to  the forehead EXAM: CT HEAD WITHOUT CONTRAST CT CERVICAL SPINE WITHOUT CONTRAST TECHNIQUE: Multidetector CT imaging of the head and cervical spine was performed following the standard protocol without intravenous contrast. Multiplanar CT image reconstructions of the cervical spine were also generated. COMPARISON:  CT 11/05/2018, 08/31/2018, MRI 11/04/2017 FINDINGS: CT HEAD FINDINGS Brain: No acute territorial infarction, hemorrhage or intracranial mass. Prominent atrophy and extensive small vessel ischemic changes of the white matter. Chronic infarct in the right occipital lobe. Chronic lacunar infarcts in the left thalamus, pons, and basal ganglia. Stable prominent ventricles. Vascular: No hyperdense vessels. Carotid vascular calcification and vertebral calcification Skull: Fluid in the left mastoid.  No fracture Sinuses/Orbits: Mucosal thickening in the ethmoid sinuses Other: Moderate left frontal scalp hematoma and left posterior scalp laceration CT CERVICAL SPINE FINDINGS Alignment: No subluxation.  Facet alignment within normal limits. Skull base and vertebrae: No acute fracture. No primary bone lesion or focal pathologic process. Soft tissues and spinal canal: No prevertebral fluid or swelling. No visible canal hematoma. Disc levels: Mild degenerative changes C3-C4, C5-C6 and moderate degenerative changes C4-C5 and C6-C7. Multiple level bilateral foraminal narrowing. Upper chest: Lung apices clear. Minimal emphysematous change. Carotid vascular calcification Other: None IMPRESSION: 1. No CT evidence for acute intracranial abnormality. Atrophy and extensive small vessel ischemic changes of the white matter with chronic infarcts in the right occipital lobe, thalamus, pons, and basal ganglia 2. Degenerative changes of the cervical spine without acute osseous abnormality 3. Moderate left forehead hematoma and moderate left posterior scalp laceration Electronically Signed   By: Jasmine Pang M.D.   On: 01/08/2019  20:41   Ct Cervical Spine Wo Contrast  Result Date: 01/08/2019 CLINICAL DATA:  Unwitnessed fall with hematoma to the forehead EXAM: CT HEAD WITHOUT CONTRAST CT CERVICAL SPINE WITHOUT CONTRAST TECHNIQUE: Multidetector CT imaging of the head and cervical spine was performed following the standard protocol without intravenous contrast. Multiplanar CT image reconstructions of the cervical spine were also generated. COMPARISON:  CT 11/05/2018, 08/31/2018, MRI 11/04/2017  FINDINGS: CT HEAD FINDINGS Brain: No acute territorial infarction, hemorrhage or intracranial mass. Prominent atrophy and extensive small vessel ischemic changes of the white matter. Chronic infarct in the right occipital lobe. Chronic lacunar infarcts in the left thalamus, pons, and basal ganglia. Stable prominent ventricles. Vascular: No hyperdense vessels. Carotid vascular calcification and vertebral calcification Skull: Fluid in the left mastoid.  No fracture Sinuses/Orbits: Mucosal thickening in the ethmoid sinuses Other: Moderate left frontal scalp hematoma and left posterior scalp laceration CT CERVICAL SPINE FINDINGS Alignment: No subluxation.  Facet alignment within normal limits. Skull base and vertebrae: No acute fracture. No primary bone lesion or focal pathologic process. Soft tissues and spinal canal: No prevertebral fluid or swelling. No visible canal hematoma. Disc levels: Mild degenerative changes C3-C4, C5-C6 and moderate degenerative changes C4-C5 and C6-C7. Multiple level bilateral foraminal narrowing. Upper chest: Lung apices clear. Minimal emphysematous change. Carotid vascular calcification Other: None IMPRESSION: 1. No CT evidence for acute intracranial abnormality. Atrophy and extensive small vessel ischemic changes of the white matter with chronic infarcts in the right occipital lobe, thalamus, pons, and basal ganglia 2. Degenerative changes of the cervical spine without acute osseous abnormality 3. Moderate left forehead  hematoma and moderate left posterior scalp laceration Electronically Signed   By: Jasmine Pang M.D.   On: 01/08/2019 20:41   Dg Chest Portable 1 View  Result Date: 01/08/2019 CLINICAL DATA:  Larey Seat.  Chest pain. EXAM: PORTABLE CHEST 1 VIEW COMPARISON:  Chest x-ray 08/31/2018 FINDINGS: The cardiac silhouette, mediastinal and hilar contours are within normal limits and stable. Mild tortuosity and calcification of the thoracic aorta. Stable surgical changes from coronary artery bypass surgery. A loop recorder is also noted. The lungs are clear of an acute process. No infiltrates, edema, effusions or pneumothorax. Left-sided skin fold is noted. Remote healed left upper rib fracture noted. No definite acute rib fractures. IMPRESSION: No acute cardiopulmonary findings and intact bony thorax. Electronically Signed   By: Rudie Meyer M.D.   On: 01/08/2019 19:47    Procedures .Marland KitchenLaceration Repair Date/Time: 01/08/2019 10:04 PM Performed by: Vallery Ridge, MD Authorized by: Vallery Ridge, MD   Consent:    Consent obtained:  Verbal   Consent given by:  Patient Anesthesia (see MAR for exact dosages):    Anesthesia method:  Local infiltration   Local anesthetic:  Lidocaine 1% WITH epi Laceration details:    Location:  Scalp   Scalp location:  Crown   Length (cm):  1 Repair type:    Repair type:  Simple Pre-procedure details:    Preparation:  Patient was prepped and draped in usual sterile fashion and imaging obtained to evaluate for foreign bodies Exploration:    Contaminated: no   Treatment:    Area cleansed with:  Soap and water   Amount of cleaning:  Standard Skin repair:    Repair method:  Sutures   Suture material:  Chromic gut (4-0)   Number of sutures:  1 Approximation:    Approximation:  Close Post-procedure details:    Patient tolerance of procedure:  Tolerated well, no immediate complications   (including critical care time)  Medications Ordered in ED Medications  haloperidol  lactate (HALDOL) injection 2 mg (2 mg Intravenous Given 01/08/19 1911)  haloperidol lactate (HALDOL) injection 2 mg (2 mg Intravenous Given 01/08/19 1955)  lidocaine-EPINEPHrine (XYLOCAINE W/EPI) 2 %-1:200000 (PF) injection (  Given 01/08/19 2157)     Initial Impression / Assessment and Plan / ED Course  I have reviewed the  triage vital signs and the nursing notes.  Pertinent labs & imaging results that were available during my care of the patient were reviewed by me and considered in my medical decision making (see chart for details).  78 year old male with a history of vascular dementia, CVA, CHF, CKD, HLD, HTN presents from his nursing facility after an unwitnessed fall.  Hemodynamically stable.  Afebrile.  5 out of 5 strength in bilateral upper and lower extremities. Patient is oriented to person and place but not time.  Patient became progressively more agitated likely secondary to sundowning.  Haldol given with improvement.  CT head and C-spine showed no acute abnormalities.  Laceration on the top of the scalp repaired.  Patient discharged back to his nursing facility.   Final Clinical Impressions(s) / ED Diagnoses   Final diagnoses:  Fall, initial encounter  Laceration of scalp without foreign body, initial encounter    ED Discharge Orders    None       Vallery Ridge, MD 01/08/19 2205    Clarene Duke Ambrose Finland, MD 01/09/19 [phone]

## 2019-01-08 NOTE — Care Management (Addendum)
ED CM noted where patient is active with [facility], CM placed call to on-call nurse at [facility], awaiting a return call.

## 2019-01-14 NOTE — Progress Notes (Signed)
Carelink Summary Report / Loop Recorder 

## 2019-02-08 LAB — CUP PACEART REMOTE DEVICE CHECK
Date Time Interrogation Session: 20200628013944
Implantable Pulse Generator Implant Date: 20190326

## 2019-02-09 ENCOUNTER — Ambulatory Visit (INDEPENDENT_AMBULATORY_CARE_PROVIDER_SITE_OTHER): Payer: Medicare Other | Admitting: *Deleted

## 2019-02-09 DIAGNOSIS — I6340 Cerebral infarction due to embolism of unspecified cerebral artery: Secondary | ICD-10-CM | POA: Diagnosis not present

## 2019-02-17 NOTE — Progress Notes (Signed)
Carelink Summary Report / Loop Recorder 

## 2019-03-12 ENCOUNTER — Ambulatory Visit (INDEPENDENT_AMBULATORY_CARE_PROVIDER_SITE_OTHER): Payer: Medicare Other | Admitting: *Deleted

## 2019-03-12 DIAGNOSIS — I6340 Cerebral infarction due to embolism of unspecified cerebral artery: Secondary | ICD-10-CM | POA: Diagnosis not present

## 2019-03-13 LAB — CUP PACEART REMOTE DEVICE CHECK
Date Time Interrogation Session: 20200731013622
Implantable Pulse Generator Implant Date: 20190326

## 2019-03-19 NOTE — Progress Notes (Signed)
Carelink Summary Report / Loop Recorder 

## 2019-03-22 ENCOUNTER — Encounter (HOSPITAL_COMMUNITY): Payer: Self-pay

## 2019-03-22 ENCOUNTER — Emergency Department (HOSPITAL_COMMUNITY)
Admission: EM | Admit: 2019-03-22 | Discharge: 2019-03-23 | Disposition: A | Discharge status: Home / Self Care | Attending: Emergency Medicine | Admitting: Emergency Medicine

## 2019-03-22 DIAGNOSIS — Z87891 Personal history of nicotine dependence: Secondary | ICD-10-CM | POA: Diagnosis not present

## 2019-03-22 DIAGNOSIS — I509 Heart failure, unspecified: Secondary | ICD-10-CM | POA: Diagnosis not present

## 2019-03-22 DIAGNOSIS — Z7901 Long term (current) use of anticoagulants: Secondary | ICD-10-CM | POA: Diagnosis not present

## 2019-03-22 DIAGNOSIS — Z95818 Presence of other cardiac implants and grafts: Secondary | ICD-10-CM | POA: Insufficient documentation

## 2019-03-22 DIAGNOSIS — S0101XA Laceration without foreign body of scalp, initial encounter: Secondary | ICD-10-CM | POA: Diagnosis not present

## 2019-03-22 DIAGNOSIS — I2510 Atherosclerotic heart disease of native coronary artery without angina pectoris: Secondary | ICD-10-CM | POA: Insufficient documentation

## 2019-03-22 DIAGNOSIS — Z23 Encounter for immunization: Secondary | ICD-10-CM | POA: Insufficient documentation

## 2019-03-22 DIAGNOSIS — Y92122 Bedroom in nursing home as the place of occurrence of the external cause: Secondary | ICD-10-CM | POA: Diagnosis not present

## 2019-03-22 DIAGNOSIS — N182 Chronic kidney disease, stage 2 (mild): Secondary | ICD-10-CM | POA: Diagnosis not present

## 2019-03-22 DIAGNOSIS — I130 Hypertensive heart and chronic kidney disease with heart failure and stage 1 through stage 4 chronic kidney disease, or unspecified chronic kidney disease: Secondary | ICD-10-CM | POA: Insufficient documentation

## 2019-03-22 DIAGNOSIS — Y998 Other external cause status: Secondary | ICD-10-CM | POA: Insufficient documentation

## 2019-03-22 DIAGNOSIS — Z8673 Personal history of transient ischemic attack (TIA), and cerebral infarction without residual deficits: Secondary | ICD-10-CM | POA: Diagnosis not present

## 2019-03-22 DIAGNOSIS — Z951 Presence of aortocoronary bypass graft: Secondary | ICD-10-CM | POA: Insufficient documentation

## 2019-03-22 DIAGNOSIS — Y9389 Activity, other specified: Secondary | ICD-10-CM | POA: Diagnosis not present

## 2019-03-22 DIAGNOSIS — S0990XA Unspecified injury of head, initial encounter: Secondary | ICD-10-CM | POA: Diagnosis present

## 2019-03-22 DIAGNOSIS — W19XXXA Unspecified fall, initial encounter: Secondary | ICD-10-CM

## 2019-03-22 DIAGNOSIS — W06XXXA Fall from bed, initial encounter: Secondary | ICD-10-CM | POA: Diagnosis not present

## 2019-03-22 NOTE — ED Triage Notes (Signed)
Pt comes via [facility] from [facility] for fall about 30 minutes ago, laceration to top of scalp, and skin tear to L elbow, AxO x 3, on plavix

## 2019-03-23 ENCOUNTER — Emergency Department (HOSPITAL_COMMUNITY)

## 2019-03-23 MED ORDER — TETANUS-DIPHTH-ACELL PERTUSSIS 5-2.5-18.5 LF-MCG/0.5 IM SUSP
0.5000 mL | Freq: Once | INTRAMUSCULAR | Status: AC
  Administered 2019-03-23: 0.5 mL via INTRAMUSCULAR
  Filled 2019-03-23: qty 0.5

## 2019-03-23 MED ORDER — LIDOCAINE-EPINEPHRINE (PF) 2 %-1:200000 IJ SOLN
10.0000 mL | Freq: Once | INTRAMUSCULAR | Status: AC
  Administered 2019-03-23: 10 mL
  Filled 2019-03-23: qty 20

## 2019-03-23 NOTE — ED Provider Notes (Signed)
[facility] Emergency Department Provider Note MRN:  [id number]  Arrival date & time: 03/23/19     Chief Complaint   Fall   History of Present Illness   Thomas AlbaMarvin D Guida Jr. is a 78 y.o. year-old male with a history of CHF, CKD presenting to the ED with chief complaint of fall.  Patient explains that he was repositioning in bed and lost his balance and fell out of bed.  Struck his head against the floor.  Laceration to the scalp.  Denies loss consciousness, no nausea or vomiting.  Takes Plavix.  Scrape to the left shoulder but no significant pain to the neck or shoulders or chest or abdomen.  No leg or hip pain.  Currently with minimal pain or symptoms.  Review of Systems  A complete 10 system review of systems was obtained and all systems are negative except as noted in the HPI and PMH.   Patient's Health History    Past Medical History:  Diagnosis Date  . CHF (congestive heart failure) (HCC) 1999  . CKD (chronic kidney disease), stage II   . Coronary artery disease   . Hx of colonic polyps   . Hyperlipidemia   . Hyperplastic colon polyp 04/15/2007  . Hypertension   . Prostatic hypertrophy, benign    with elevated PSA  . Stroke (HCC)   . TIA (transient ischemic attack) 2003   "mini stroke" (03/19/2013)    Past Surgical History:  Procedure Laterality Date  . CARDIAC CATHETERIZATION    . CATARACT EXTRACTION W/ INTRAOCULAR LENS  IMPLANT, BILATERAL Bilateral 1998  . COLONOSCOPY  2008  . CORONARY ARTERY BYPASS GRAFT  1999   x4  . CYSTOSCOPY WITH INSERTION OF UROLIFT N/A 05/26/2018   Procedure: CYSTOSCOPY WITH INSERTION OF UROLIFT X 6;  Surgeon: Crista ElliotBell, Eugene D III, MD;  Location: [facility];  Service: Urology;  Laterality: N/A;  . LOOP RECORDER INSERTION N/A 11/05/2017   Procedure: LOOP RECORDER INSERTION;  Surgeon: Hillis RangeAllred, James, MD;  Location: [facility] INVASIVE CV LAB;  Service: Cardiovascular;  Laterality: N/A;  . TEE WITHOUT CARDIOVERSION N/A 11/05/2017   Procedure:  TRANSESOPHAGEAL ECHOCARDIOGRAM (TEE) WITH LOOP;  Surgeon: Lars MassonNelson, Katarina H, MD;  [facility];  Service: Cardiovascular;  Laterality: N/A;    Family History  Problem Relation Age of Onset  . Coronary artery disease Mother   . Heart disease Mother   . Stroke Father   . Heart disease Father   . Heart disease Brother     Social History   Socioeconomic History  . Marital status: Married    Spouse name: cathy  . Number of children: 2  . Years of education: college  . Highest education level: Not on file  Occupational History  . Occupation: retired  Engineer, productionocial Needs  . Financial resource strain: Not on file  . Food insecurity    Worry: Not on file    Inability: Not on file  . Transportation needs    Medical: Not on file    Non-medical: Not on file  Tobacco Use  . Smoking status: Former Smoker    Packs/day: 0.75    Years: 50.00    Pack years: 37.50    Types: Cigarettes    Quit date: 03/19/2013    Years since quitting: 6.0  . Smokeless tobacco: Never Used  Substance and Sexual Activity  . Alcohol use: Yes    Alcohol/week: 1.0 standard drinks    Types: 1 Cans of beer per week  Comment: does not drink anymore  . Drug use: No  . Sexual activity: Not Currently  Lifestyle  . Physical activity    Days per week: Not on file    Minutes per session: Not on file  . Stress: Not on file  Relationships  . Social Musician on phone: Not on file    Gets together: Not on file    Attends religious service: Not on file    Active member of club or organization: Not on file    Attends meetings of clubs or organizations: Not on file    Relationship status: Not on file  . Intimate partner violence    Fear of current or ex partner: Not on file    Emotionally abused: Not on file    Physically abused: Not on file    Forced sexual activity: Not on file  Other Topics Concern  . Not on file  Social History Narrative   Patient lives at home with wife [name])   Retired.    Education one year of college.   Right handed.   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      Physical Exam  Vital Signs and Nursing Notes reviewed Vitals:   03/22/19 2317 03/22/19 2321  BP:  132/90  Pulse:  (!) 42  Resp:  12  Temp:  98 F (36.7 C)  SpO2: 96% 100%    CONSTITUTIONAL: Well-appearing, NAD NEURO:  Alert and oriented x 3, no focal deficits EYES:  eyes equal and reactive ENT/NECK:  no LAD, no JVD CARDIO: Regular rate, well-perfused, normal S1 and S2 PULM:  CTAB no wheezing or rhonchi GI/GU:  normal bowel sounds, non-distended, non-tender MSK/SPINE:  No gross deformities, no edema SKIN: 3 cm laceration to the left parietal scalp; abrasion to the left shoulder PSYCH:  Appropriate speech and behavior  Diagnostic and Interventional Summary    EKG Interpretation  Date/Time:  Monday March 23 2019 00:43:31 EDT Ventricular Rate:  45 PR Interval:    QRS Duration: 105 QT Interval:  457 QTC Calculation: 396 R Axis:   22 Text Interpretation:  Sinus bradycardia Minimal ST depression, lateral leads Confirmed by Kennis Carina [phone]) on 03/23/2019 1:48:42 AM      Labs Reviewed - No data to display  CT HEAD WO CONTRAST  Final Result    CT CERVICAL SPINE WO CONTRAST  Final Result      Medications  Tdap (BOOSTRIX) injection 0.5 mL (0.5 mLs Intramuscular Given 03/23/19 0045)  lidocaine-EPINEPHrine (XYLOCAINE W/EPI) 2 %-1:200000 (PF) injection 10 mL (10 mLs Other Given by Other 03/23/19 0045)     .Marland KitchenLaceration Repair  Date/Time: 03/23/2019 2:04 AM Performed by: Sabas Sous, MD Authorized by: Sabas Sous, MD   Consent:    Consent obtained:  Verbal   Consent given by:  Patient   Risks discussed:  Pain Anesthesia (see MAR for exact dosages):    Anesthesia method:  Local infiltration   Local anesthetic:  Lidocaine 2% WITH epi Laceration details:    Location:  Scalp   Scalp location:  L parietal   Length (cm):  3   Depth (mm):  3 Repair type:    Repair  type:  Simple Exploration:    Wound exploration: wound explored through full range of motion     Contaminated: no   Treatment:    Area cleansed with:  Saline   Amount of cleaning:  Standard Skin repair:    Repair method:  Staples  Number of staples:  3 Approximation:    Approximation:  Close Post-procedure details:    Dressing:  Open (no dressing)   Patient tolerance of procedure:  Tolerated well, no immediate complications   Critical Care  ED Course and Medical Decision Making  I have reviewed the triage vital signs and the nursing notes.  Pertinent labs & imaging results that were available during my care of the patient were reviewed by me and considered in my medical decision making (see below for details).  Mechanical fall on anticoagulation, CTs are without acute findings of the head or cervical spine.  Laceration repaired as described above.  Patient is otherwise well-appearing, does have some bradycardia on this visit with rates in the 40s.  EKG reveals sinus bradycardia and he is well perfused.  Does not seem related to his mechanical fall.  After the discussed management above, the patient was determined to be safe for discharge.  The patient was in agreement with this plan and all questions regarding their care were answered.  ED return precautions were discussed and the patient will return to the ED with any significant worsening of condition.  Barth Kirks. Sedonia Small, MD [place] [email or web address]  Final Clinical Impressions(s) / ED Diagnoses     ICD-10-CM   1. Fall, initial encounter  W19.XXXA   2. Laceration of scalp, initial encounter  S01.01XA   3. Anticoagulated  Z79.01     ED Discharge Orders    None         Maudie Flakes, MD 03/23/19 [phone]

## 2019-03-23 NOTE — ED Notes (Signed)
ptar called by dee 

## 2019-03-23 NOTE — Discharge Instructions (Addendum)
You were evaluated in the Emergency Department and after careful evaluation, we did not find any emergent condition requiring admission or further testing in the hospital.  Your CT scans today were reassuring.  We repaired your laceration with staples, which will need to be removed by healthcare professional in 10 to 14 days.  Please return to the Emergency Department if you experience any worsening of your condition.  We encourage you to follow up with a primary care provider.  Thank you for allowing [place] to be a part of your care.

## 2019-04-14 ENCOUNTER — Ambulatory Visit (INDEPENDENT_AMBULATORY_CARE_PROVIDER_SITE_OTHER): Payer: Medicare Other | Admitting: *Deleted

## 2019-04-14 DIAGNOSIS — I6340 Cerebral infarction due to embolism of unspecified cerebral artery: Secondary | ICD-10-CM | POA: Diagnosis not present

## 2019-04-15 LAB — CUP PACEART REMOTE DEVICE CHECK
Date Time Interrogation Session: 20200902021002
Implantable Pulse Generator Implant Date: 20190326

## 2019-04-27 NOTE — Progress Notes (Signed)
Carelink Summary Report / Loop Recorder 

## 2019-05-18 ENCOUNTER — Ambulatory Visit (INDEPENDENT_AMBULATORY_CARE_PROVIDER_SITE_OTHER): Payer: Medicare Other | Admitting: *Deleted

## 2019-05-18 DIAGNOSIS — I5032 Chronic diastolic (congestive) heart failure: Secondary | ICD-10-CM

## 2019-05-18 LAB — CUP PACEART REMOTE DEVICE CHECK
Date Time Interrogation Session: 20201005022844
Implantable Pulse Generator Implant Date: 20190326

## 2019-05-25 NOTE — Progress Notes (Signed)
Carelink Summary Report / Loop Recorder 

## 2019-06-18 LAB — CUP PACEART REMOTE DEVICE CHECK
Date Time Interrogation Session: 20201105192453
Implantable Pulse Generator Implant Date: 20190326

## 2019-06-19 ENCOUNTER — Ambulatory Visit (INDEPENDENT_AMBULATORY_CARE_PROVIDER_SITE_OTHER): Payer: Medicare Other | Admitting: *Deleted

## 2019-06-19 DIAGNOSIS — I5032 Chronic diastolic (congestive) heart failure: Secondary | ICD-10-CM

## 2019-06-19 DIAGNOSIS — I6340 Cerebral infarction due to embolism of unspecified cerebral artery: Secondary | ICD-10-CM

## 2019-06-20 LAB — CUP PACEART REMOTE DEVICE CHECK
Date Time Interrogation Session: 20201107022725
Implantable Pulse Generator Implant Date: 20190326

## 2019-07-05 NOTE — Progress Notes (Signed)
Carelink Summary Report / Loop Recorder 

## 2019-07-22 ENCOUNTER — Ambulatory Visit (INDEPENDENT_AMBULATORY_CARE_PROVIDER_SITE_OTHER): Payer: Medicare Other | Admitting: *Deleted

## 2019-07-22 DIAGNOSIS — I63531 Cerebral infarction due to unspecified occlusion or stenosis of right posterior cerebral artery: Secondary | ICD-10-CM | POA: Diagnosis not present

## 2019-07-23 LAB — CUP PACEART REMOTE DEVICE CHECK
Date Time Interrogation Session: 20201209212748
Implantable Pulse Generator Implant Date: 20190326

## 2019-08-24 ENCOUNTER — Ambulatory Visit (INDEPENDENT_AMBULATORY_CARE_PROVIDER_SITE_OTHER): Payer: Medicare Other | Admitting: *Deleted

## 2019-08-24 DIAGNOSIS — I63531 Cerebral infarction due to unspecified occlusion or stenosis of right posterior cerebral artery: Secondary | ICD-10-CM

## 2019-08-25 LAB — CUP PACEART REMOTE DEVICE CHECK
Date Time Interrogation Session: 20210111213902
Implantable Pulse Generator Implant Date: 20190326

## 2019-09-22 ENCOUNTER — Telehealth: Payer: Self-pay

## 2019-09-22 NOTE — Telephone Encounter (Signed)
LMOVM for pt to stop sending manual transmissions. I also left my direct office number for the pt to call if he has any questions.

## 2019-09-24 ENCOUNTER — Ambulatory Visit (INDEPENDENT_AMBULATORY_CARE_PROVIDER_SITE_OTHER): Payer: Medicare PPO | Admitting: *Deleted

## 2019-09-24 DIAGNOSIS — I63531 Cerebral infarction due to unspecified occlusion or stenosis of right posterior cerebral artery: Secondary | ICD-10-CM

## 2019-09-24 LAB — CUP PACEART REMOTE DEVICE CHECK
Date Time Interrogation Session: 20210210234512
Implantable Pulse Generator Implant Date: 20190326

## 2019-09-24 NOTE — Progress Notes (Signed)
ILR Remote 

## 2019-10-26 ENCOUNTER — Ambulatory Visit (INDEPENDENT_AMBULATORY_CARE_PROVIDER_SITE_OTHER): Payer: Medicare PPO | Admitting: *Deleted

## 2019-10-26 DIAGNOSIS — I63531 Cerebral infarction due to unspecified occlusion or stenosis of right posterior cerebral artery: Secondary | ICD-10-CM

## 2019-10-26 LAB — CUP PACEART REMOTE DEVICE CHECK
Date Time Interrogation Session: 20210314002333
Implantable Pulse Generator Implant Date: 20190326

## 2019-10-26 NOTE — Progress Notes (Signed)
ILR Remote 

## 2019-11-26 ENCOUNTER — Ambulatory Visit (INDEPENDENT_AMBULATORY_CARE_PROVIDER_SITE_OTHER): Admitting: *Deleted

## 2019-11-26 DIAGNOSIS — I63531 Cerebral infarction due to unspecified occlusion or stenosis of right posterior cerebral artery: Secondary | ICD-10-CM | POA: Diagnosis not present

## 2019-11-26 LAB — CUP PACEART REMOTE DEVICE CHECK
Date Time Interrogation Session: 20210415122551
Implantable Pulse Generator Implant Date: 20190326

## 2019-11-27 NOTE — Progress Notes (Signed)
ILR Remote 

## 2019-12-28 ENCOUNTER — Ambulatory Visit (INDEPENDENT_AMBULATORY_CARE_PROVIDER_SITE_OTHER): Admitting: *Deleted

## 2019-12-28 DIAGNOSIS — I6340 Cerebral infarction due to embolism of unspecified cerebral artery: Secondary | ICD-10-CM | POA: Diagnosis not present

## 2019-12-28 LAB — CUP PACEART REMOTE DEVICE CHECK
Date Time Interrogation Session: 20210516123029
Implantable Pulse Generator Implant Date: 20190326

## 2019-12-28 NOTE — Progress Notes (Signed)
Carelink Summary Report / Loop Recorder 

## 2020-01-12 DEATH — deceased

## 2020-01-28 ENCOUNTER — Encounter

## 2020-02-29 ENCOUNTER — Encounter

## 2020-03-31 ENCOUNTER — Encounter

## 2020-05-02 ENCOUNTER — Encounter

## 2020-06-01 IMAGING — CT CT HEAD W/O CM
4 series · 15 of 47 positions shown, 17 images · non-contrast
Comparison: 11/04/2017 MRI of the head, CT of the head, CT
perfusion of the head, CTA of the head..

CLINICAL DATA: 76 y/o M; increased confusion. History of vascular
dementia.

EXAM:
CT HEAD WITHOUT CONTRAST
TECHNIQUE: Contiguous axial images were obtained from the base of the skull
through the vertex without intravenous contrast.

[Series 3: head without · axial · non-contrast · 0.41mm/px · z∈[-100,+30]mm · 7 of 36 slices shown, 9 images]
[im 5/36  brain]
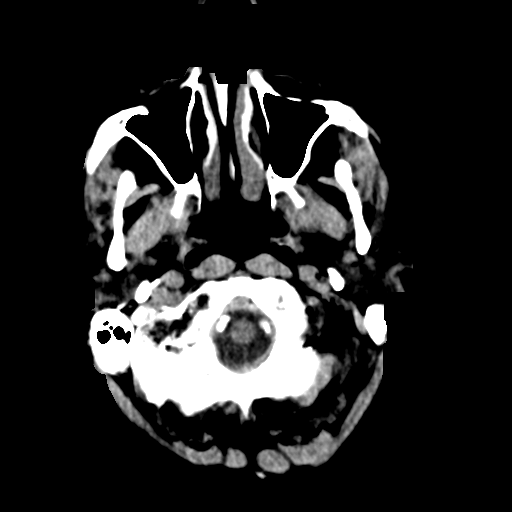
[im 5/36  bone]
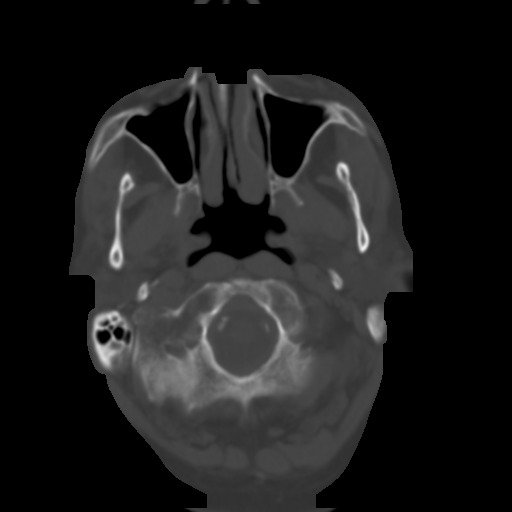
[im 9/36  brain]
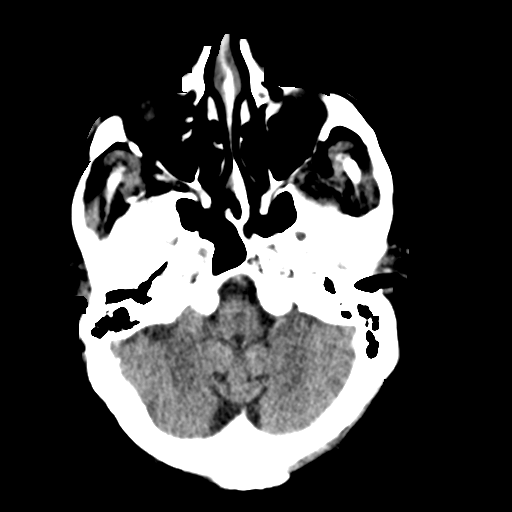
[im 14/36  brain]
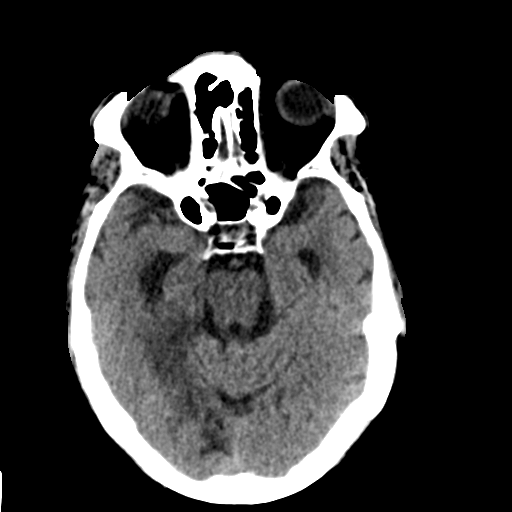
[im 18/36  brain]
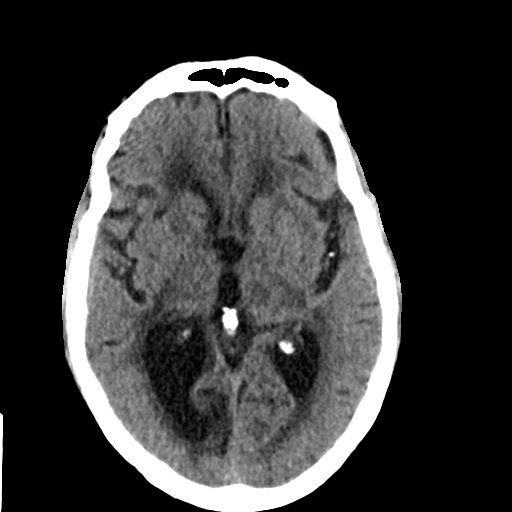
[im 22/36  brain]
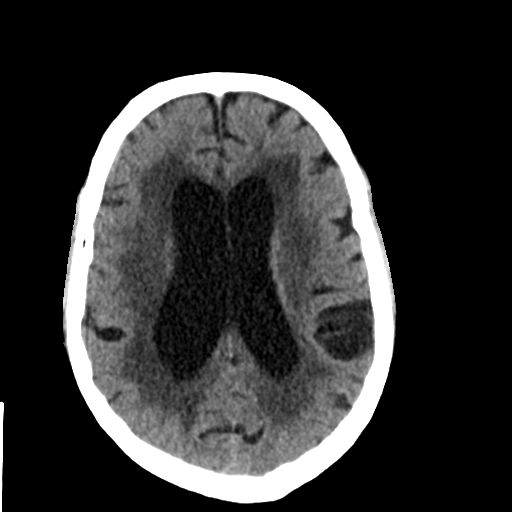
[im 22/36  bone]
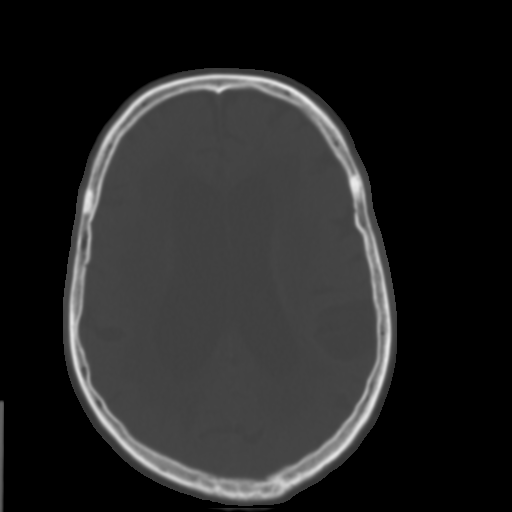
[im 27/36  brain]
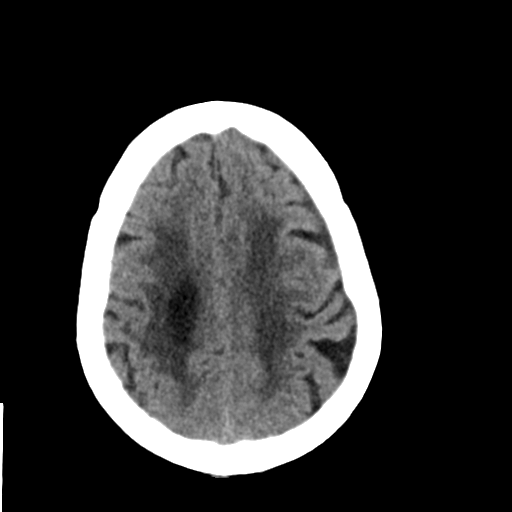
[im 31/36  brain]
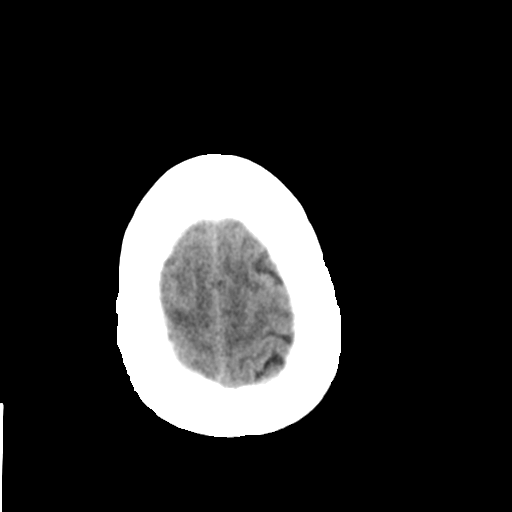

[Series 4: head bone · axial · 0.41mm/px · z∈[-104,-86]mm · 2 of 89 slices shown]
[im 9/89  bone]
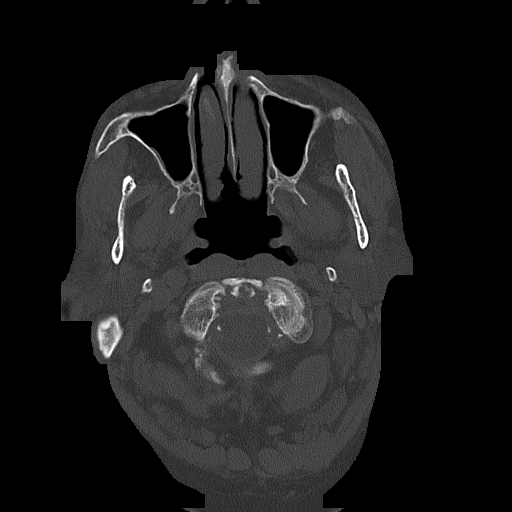
[im 18/89  bone]
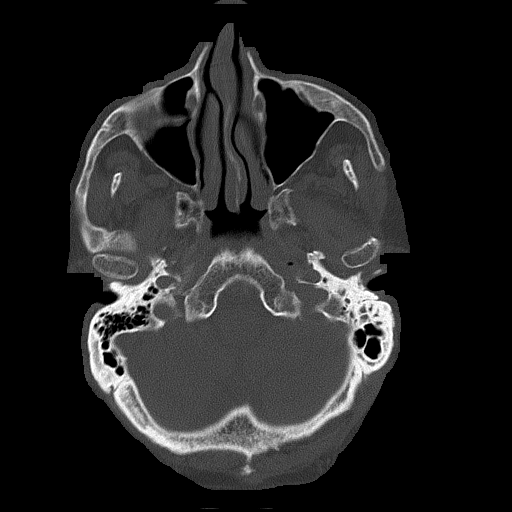

[Series 5: head without cor · coronal · non-contrast · 0.35mm/px · 3 of 70 slices shown]
[im 24/70  brain]
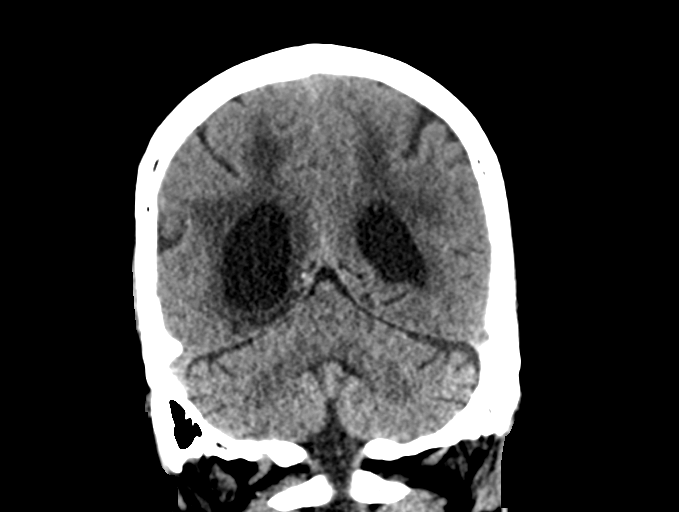
[im 31/70  brain]
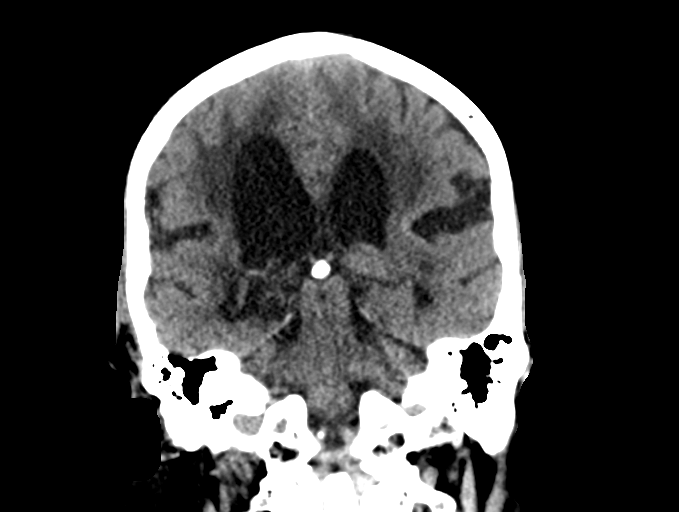
[im 39/70  brain]
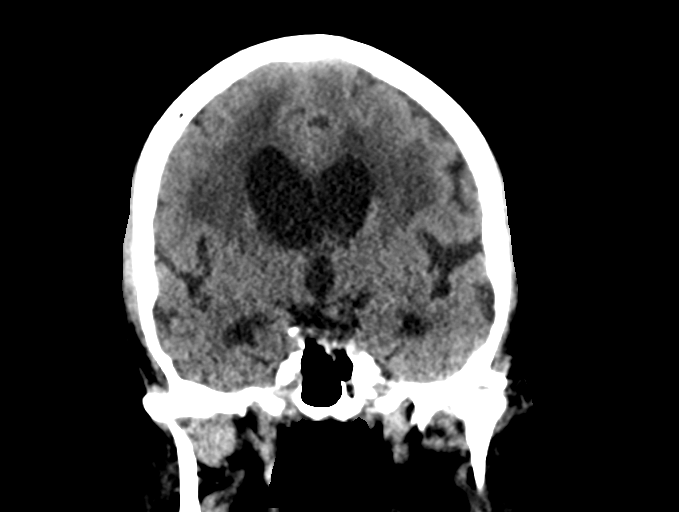

[Series 6: head without sag · sagittal · non-contrast · 0.35mm/px · 3 of 67 slices shown]
[im 23/67  brain]
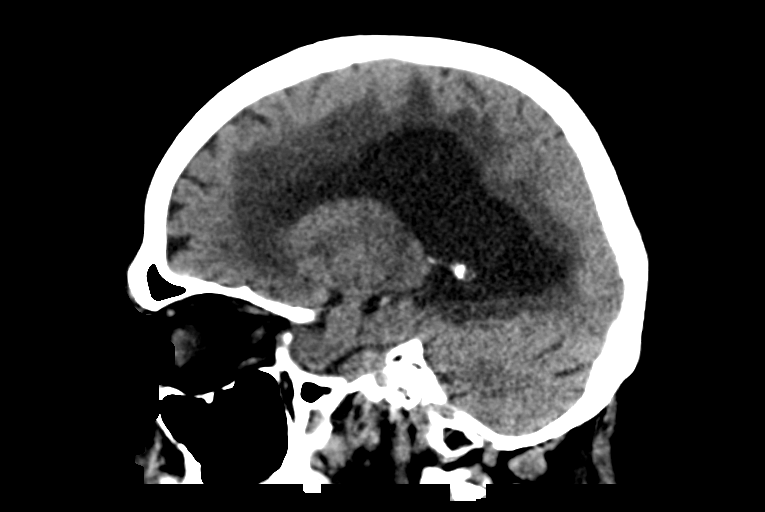
[im 34/67  brain]
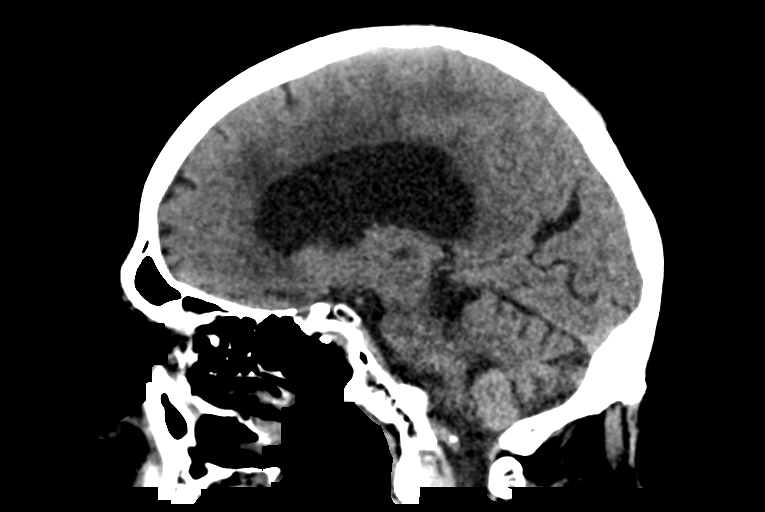
[im 45/67  brain]
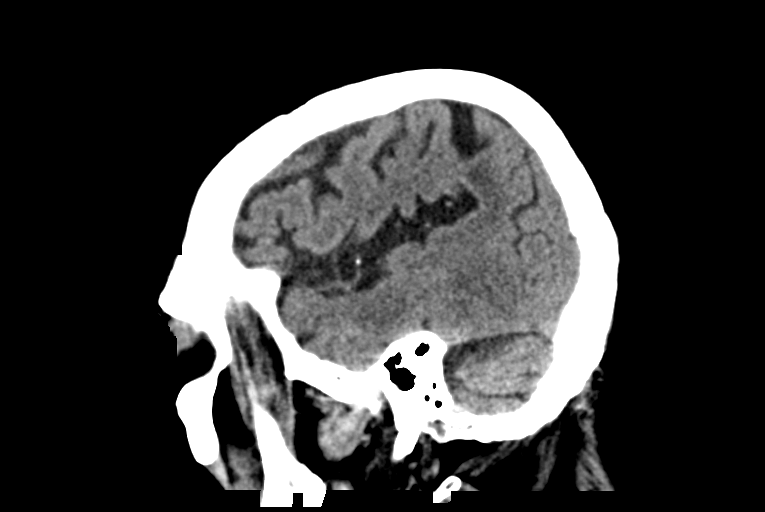

[15 of 47 positions shown; findings below may reference images not displayed]

FINDINGS: Brain: Chronic infarct of the right medial temporal lobe and
occipital lobe. Confluent hypoattenuation in white matter is
compatible with advanced chronic microvascular ischemic changes and
stable from the prior study. There multiple chronic lacunar infarcts
within the basal ganglia bilaterally and the pons. Stable brain
parenchymal volume loss.

Vascular: Calcific atherosclerosis of carotid siphons and vertebral
arteries.

Skull: Normal. Negative for fracture or focal lesion.

Sinuses/Orbits: Mild mucosal thickening of the ethmoid air cells.
Normal aeration of mastoid air cells. Bilateral intra-ocular lens
replacement.

Other: None.
IMPRESSION: 1. No acute intracranial abnormality identified.
2. Stable advanced chronic microvascular ischemic changes and
parenchymal volume loss of the brain. Multiple stable chronic
lacunar infarcts of the basal ganglia and pons. Chronic right PCA
distribution infarct.

By: Tsebe Herbst M.D.

## 2020-06-02 ENCOUNTER — Encounter

## 2020-07-04 ENCOUNTER — Encounter
